# Patient Record
Sex: Female | Born: 1955 | Race: White | Hispanic: No | Marital: Married | Smoking: Never smoker
Health system: Southern US, Community
[De-identification: ages and names within clinical notes are randomized; demographics above are authoritative.]

## PROBLEM LIST (undated history)

## (undated) DIAGNOSIS — D638 Anemia in other chronic diseases classified elsewhere: Secondary | ICD-10-CM

## (undated) DIAGNOSIS — F419 Anxiety disorder, unspecified: Secondary | ICD-10-CM

## (undated) DIAGNOSIS — M1990 Unspecified osteoarthritis, unspecified site: Secondary | ICD-10-CM

## (undated) DIAGNOSIS — S83241A Other tear of medial meniscus, current injury, right knee, initial encounter: Secondary | ICD-10-CM

## (undated) DIAGNOSIS — K8020 Calculus of gallbladder without cholecystitis without obstruction: Secondary | ICD-10-CM

## (undated) DIAGNOSIS — M11262 Other chondrocalcinosis, left knee: Secondary | ICD-10-CM

## (undated) DIAGNOSIS — R002 Palpitations: Secondary | ICD-10-CM

## (undated) DIAGNOSIS — M359 Systemic involvement of connective tissue, unspecified: Secondary | ICD-10-CM

## (undated) DIAGNOSIS — K219 Gastro-esophageal reflux disease without esophagitis: Secondary | ICD-10-CM

## (undated) DIAGNOSIS — K760 Fatty (change of) liver, not elsewhere classified: Secondary | ICD-10-CM

## (undated) DIAGNOSIS — M059 Rheumatoid arthritis with rheumatoid factor, unspecified: Secondary | ICD-10-CM

## (undated) DIAGNOSIS — E785 Hyperlipidemia, unspecified: Secondary | ICD-10-CM

## (undated) DIAGNOSIS — N904 Leukoplakia of vulva: Secondary | ICD-10-CM

## (undated) DIAGNOSIS — F329 Major depressive disorder, single episode, unspecified: Secondary | ICD-10-CM

## (undated) DIAGNOSIS — G5600 Carpal tunnel syndrome, unspecified upper limb: Secondary | ICD-10-CM

## (undated) DIAGNOSIS — Z8639 Personal history of other endocrine, nutritional and metabolic disease: Secondary | ICD-10-CM

## (undated) DIAGNOSIS — I10 Essential (primary) hypertension: Secondary | ICD-10-CM

## (undated) DIAGNOSIS — M159 Polyosteoarthritis, unspecified: Secondary | ICD-10-CM

## (undated) DIAGNOSIS — L409 Psoriasis, unspecified: Secondary | ICD-10-CM

## (undated) DIAGNOSIS — J302 Other seasonal allergic rhinitis: Secondary | ICD-10-CM

## (undated) DIAGNOSIS — Z8719 Personal history of other diseases of the digestive system: Secondary | ICD-10-CM

## (undated) DIAGNOSIS — R079 Chest pain, unspecified: Secondary | ICD-10-CM

## (undated) DIAGNOSIS — F32A Depression, unspecified: Secondary | ICD-10-CM

## (undated) DIAGNOSIS — N3010 Interstitial cystitis (chronic) without hematuria: Secondary | ICD-10-CM

## (undated) DIAGNOSIS — M810 Age-related osteoporosis without current pathological fracture: Secondary | ICD-10-CM

## (undated) HISTORY — DX: Psoriasis, unspecified: L40.9

## (undated) HISTORY — DX: Anxiety disorder, unspecified: F41.9

## (undated) HISTORY — DX: Calculus of gallbladder without cholecystitis without obstruction: K80.20

## (undated) HISTORY — DX: Palpitations: R00.2

## (undated) HISTORY — DX: Anemia in other chronic diseases classified elsewhere: D63.8

## (undated) HISTORY — DX: Essential (primary) hypertension: I10

## (undated) HISTORY — DX: Fatty (change of) liver, not elsewhere classified: K76.0

## (undated) HISTORY — DX: Polyosteoarthritis, unspecified: M15.9

## (undated) HISTORY — DX: Personal history of other endocrine, nutritional and metabolic disease: Z86.39

## (undated) HISTORY — DX: Rheumatoid arthritis with rheumatoid factor, unspecified: M05.9

## (undated) HISTORY — DX: Gastro-esophageal reflux disease without esophagitis: K21.9

## (undated) HISTORY — DX: Depression, unspecified: F32.A

## (undated) HISTORY — DX: Hyperlipidemia, unspecified: E78.5

## (undated) HISTORY — DX: Age-related osteoporosis without current pathological fracture: M81.0

## (undated) HISTORY — DX: Unspecified osteoarthritis, unspecified site: M19.90

## (undated) HISTORY — PX: COLONOSCOPY: SHX174

## (undated) HISTORY — DX: Chest pain, unspecified: R07.9

## (undated) HISTORY — DX: Carpal tunnel syndrome, unspecified upper limb: G56.00

## (undated) HISTORY — DX: Other chondrocalcinosis, left knee: M11.262

## (undated) HISTORY — PX: BREAST BIOPSY: SHX20

## (undated) HISTORY — DX: Major depressive disorder, single episode, unspecified: F32.9

---

## 1979-07-13 HISTORY — PX: TUBAL LIGATION: SHX77

## 1989-07-12 HISTORY — PX: ABDOMINAL HYSTERECTOMY: SHX81

## 1997-12-23 ENCOUNTER — Encounter: Admission: RE | Admit: 1997-12-23 | Discharge: 1998-03-23 | Payer: Self-pay | Admitting: Internal Medicine

## 1998-07-29 ENCOUNTER — Emergency Department (HOSPITAL_COMMUNITY): Admission: EM | Admit: 1998-07-29 | Discharge: 1998-07-29 | Payer: Self-pay | Admitting: Emergency Medicine

## 1998-08-29 ENCOUNTER — Emergency Department (HOSPITAL_COMMUNITY): Admission: EM | Admit: 1998-08-29 | Discharge: 1998-08-29 | Payer: Self-pay | Admitting: Emergency Medicine

## 1999-03-23 ENCOUNTER — Other Ambulatory Visit: Admission: RE | Admit: 1999-03-23 | Discharge: 1999-03-23 | Payer: Self-pay | Admitting: Obstetrics and Gynecology

## 1999-04-23 ENCOUNTER — Encounter (INDEPENDENT_AMBULATORY_CARE_PROVIDER_SITE_OTHER): Payer: Self-pay | Admitting: Specialist

## 1999-04-23 ENCOUNTER — Other Ambulatory Visit: Admission: RE | Admit: 1999-04-23 | Discharge: 1999-04-23 | Payer: Self-pay | Admitting: Obstetrics and Gynecology

## 1999-05-21 ENCOUNTER — Other Ambulatory Visit: Admission: RE | Admit: 1999-05-21 | Discharge: 1999-05-21 | Payer: Self-pay | Admitting: Surgery

## 2000-03-17 ENCOUNTER — Emergency Department (HOSPITAL_COMMUNITY): Admission: EM | Admit: 2000-03-17 | Discharge: 2000-03-17 | Payer: Self-pay | Admitting: Emergency Medicine

## 2000-12-19 ENCOUNTER — Encounter: Admission: RE | Admit: 2000-12-19 | Discharge: 2001-02-08 | Payer: Self-pay | Admitting: Pulmonary Disease

## 2001-01-05 ENCOUNTER — Other Ambulatory Visit: Admission: RE | Admit: 2001-01-05 | Discharge: 2001-01-05 | Payer: Self-pay | Admitting: Obstetrics and Gynecology

## 2001-11-21 ENCOUNTER — Other Ambulatory Visit: Admission: RE | Admit: 2001-11-21 | Discharge: 2001-11-21 | Payer: Self-pay | Admitting: Obstetrics and Gynecology

## 2002-07-12 HISTORY — PX: CYSTOSCOPY: SUR368

## 2004-10-12 ENCOUNTER — Ambulatory Visit: Payer: Self-pay | Admitting: Family Medicine

## 2004-10-20 ENCOUNTER — Ambulatory Visit: Payer: Self-pay | Admitting: Internal Medicine

## 2004-11-23 ENCOUNTER — Ambulatory Visit: Payer: Self-pay | Admitting: Internal Medicine

## 2004-12-01 ENCOUNTER — Ambulatory Visit: Payer: Self-pay | Admitting: Internal Medicine

## 2005-02-08 ENCOUNTER — Ambulatory Visit: Payer: Self-pay | Admitting: Internal Medicine

## 2005-04-05 ENCOUNTER — Ambulatory Visit: Payer: Self-pay | Admitting: Internal Medicine

## 2005-04-07 ENCOUNTER — Ambulatory Visit: Payer: Self-pay | Admitting: Internal Medicine

## 2005-04-09 ENCOUNTER — Ambulatory Visit: Payer: Self-pay | Admitting: Internal Medicine

## 2005-04-13 ENCOUNTER — Ambulatory Visit: Payer: Self-pay | Admitting: Family Medicine

## 2005-08-24 ENCOUNTER — Ambulatory Visit: Payer: Self-pay | Admitting: Internal Medicine

## 2005-08-27 ENCOUNTER — Encounter: Admission: RE | Admit: 2005-08-27 | Discharge: 2005-08-27 | Payer: Self-pay | Admitting: Internal Medicine

## 2005-08-31 ENCOUNTER — Ambulatory Visit: Payer: Self-pay | Admitting: Internal Medicine

## 2006-07-25 ENCOUNTER — Ambulatory Visit: Payer: Self-pay | Admitting: Internal Medicine

## 2006-10-12 ENCOUNTER — Ambulatory Visit: Payer: Self-pay | Admitting: Internal Medicine

## 2006-10-12 ENCOUNTER — Encounter: Payer: Self-pay | Admitting: Family Medicine

## 2006-10-12 LAB — CONVERTED CEMR LAB
ALT: 40 units/L (ref 0–40)
AST: 33 units/L (ref 0–37)
Creatinine, Ser: 0.6 mg/dL (ref 0.4–1.2)
Creatinine,U: 59.5 mg/dL
Hgb A1c MFr Bld: 6.3 % — ABNORMAL HIGH (ref 4.6–6.0)

## 2006-10-14 ENCOUNTER — Encounter: Payer: Self-pay | Admitting: Internal Medicine

## 2006-10-26 ENCOUNTER — Ambulatory Visit: Payer: Self-pay | Admitting: Internal Medicine

## 2007-11-27 ENCOUNTER — Ambulatory Visit: Payer: Self-pay | Admitting: Internal Medicine

## 2007-11-27 LAB — CONVERTED CEMR LAB
ALT: 50 units/L — ABNORMAL HIGH (ref 0–35)
Creatinine, Ser: 0.6 mg/dL (ref 0.4–1.2)
Direct LDL: 151.3 mg/dL
Potassium: 4.2 meq/L (ref 3.5–5.1)
Triglycerides: 90 mg/dL (ref 0–149)

## 2007-12-01 DIAGNOSIS — Z9189 Other specified personal risk factors, not elsewhere classified: Secondary | ICD-10-CM | POA: Insufficient documentation

## 2007-12-01 DIAGNOSIS — G43909 Migraine, unspecified, not intractable, without status migrainosus: Secondary | ICD-10-CM | POA: Insufficient documentation

## 2007-12-01 DIAGNOSIS — E785 Hyperlipidemia, unspecified: Secondary | ICD-10-CM | POA: Insufficient documentation

## 2007-12-05 ENCOUNTER — Ambulatory Visit: Payer: Self-pay | Admitting: Internal Medicine

## 2007-12-05 DIAGNOSIS — M2550 Pain in unspecified joint: Secondary | ICD-10-CM | POA: Insufficient documentation

## 2007-12-05 LAB — CONVERTED CEMR LAB
Cholesterol, target level: 200 mg/dL
Creatinine,U: 145.5 mg/dL
HDL goal, serum: 40 mg/dL
LDL Goal: 100 mg/dL
Microalb Creat Ratio: 7.6 mg/g (ref 0.0–30.0)
Sed Rate: 27 mm/hr — ABNORMAL HIGH (ref 0–22)

## 2007-12-06 ENCOUNTER — Encounter (INDEPENDENT_AMBULATORY_CARE_PROVIDER_SITE_OTHER): Payer: Self-pay | Admitting: *Deleted

## 2008-03-01 ENCOUNTER — Ambulatory Visit: Payer: Self-pay | Admitting: Internal Medicine

## 2008-03-01 LAB — CONVERTED CEMR LAB
Creatinine, Ser: 0.6 mg/dL (ref 0.4–1.2)
Microalb, Ur: 0.5 mg/dL (ref 0.0–1.9)
Potassium: 4.2 meq/L (ref 3.5–5.1)
Total Bilirubin: 0.6 mg/dL (ref 0.3–1.2)

## 2008-03-06 ENCOUNTER — Ambulatory Visit: Payer: Self-pay | Admitting: Internal Medicine

## 2008-03-06 DIAGNOSIS — I10 Essential (primary) hypertension: Secondary | ICD-10-CM | POA: Insufficient documentation

## 2008-03-06 DIAGNOSIS — G5600 Carpal tunnel syndrome, unspecified upper limb: Secondary | ICD-10-CM | POA: Insufficient documentation

## 2008-03-06 DIAGNOSIS — K219 Gastro-esophageal reflux disease without esophagitis: Secondary | ICD-10-CM | POA: Insufficient documentation

## 2008-03-06 LAB — CONVERTED CEMR LAB
Cholesterol, target level: 200 mg/dL
LDL Goal: 110 mg/dL

## 2008-03-09 LAB — CONVERTED CEMR LAB
Direct LDL: 142.3 mg/dL
Free T4: 0.8 ng/dL (ref 0.6–1.6)
HDL: 50.3 mg/dL (ref >39.0)
VLDL: 23 mg/dL (ref 0–40)

## 2008-03-12 ENCOUNTER — Telehealth (INDEPENDENT_AMBULATORY_CARE_PROVIDER_SITE_OTHER): Payer: Self-pay | Admitting: *Deleted

## 2008-05-07 ENCOUNTER — Encounter: Payer: Self-pay | Admitting: Internal Medicine

## 2008-05-08 ENCOUNTER — Ambulatory Visit: Payer: Self-pay | Admitting: Internal Medicine

## 2008-05-10 ENCOUNTER — Encounter: Payer: Self-pay | Admitting: Internal Medicine

## 2008-06-03 ENCOUNTER — Encounter: Payer: Self-pay | Admitting: Internal Medicine

## 2008-06-27 ENCOUNTER — Encounter: Payer: Self-pay | Admitting: Internal Medicine

## 2008-07-19 ENCOUNTER — Encounter: Payer: Self-pay | Admitting: Internal Medicine

## 2008-08-01 ENCOUNTER — Ambulatory Visit: Payer: Self-pay | Admitting: Internal Medicine

## 2008-08-13 ENCOUNTER — Encounter (INDEPENDENT_AMBULATORY_CARE_PROVIDER_SITE_OTHER): Payer: Self-pay | Admitting: *Deleted

## 2008-08-13 LAB — CONVERTED CEMR LAB
AST: 32 units/L (ref 0–37)
Albumin: 3.4 g/dL — ABNORMAL LOW (ref 3.5–5.2)
Alkaline Phosphatase: 54 units/L (ref 39–117)
BUN: 13 mg/dL (ref 6–23)
Bilirubin, Direct: 0.1 mg/dL (ref 0.0–0.3)
HDL: 45.1 mg/dL (ref >39.0)
Microalb, Ur: 0.8 mg/dL (ref 0.0–1.9)
Total Bilirubin: 0.6 mg/dL (ref 0.3–1.2)

## 2008-09-05 ENCOUNTER — Ambulatory Visit: Payer: Self-pay | Admitting: Internal Medicine

## 2008-09-05 DIAGNOSIS — M25569 Pain in unspecified knee: Secondary | ICD-10-CM | POA: Insufficient documentation

## 2008-11-01 ENCOUNTER — Encounter (INDEPENDENT_AMBULATORY_CARE_PROVIDER_SITE_OTHER): Payer: Self-pay | Admitting: *Deleted

## 2008-11-13 ENCOUNTER — Ambulatory Visit: Payer: Self-pay | Admitting: Internal Medicine

## 2008-11-15 ENCOUNTER — Encounter: Payer: Self-pay | Admitting: Internal Medicine

## 2008-12-03 ENCOUNTER — Ambulatory Visit: Payer: Self-pay | Admitting: Internal Medicine

## 2008-12-03 DIAGNOSIS — Z794 Long term (current) use of insulin: Secondary | ICD-10-CM

## 2008-12-03 DIAGNOSIS — E1165 Type 2 diabetes mellitus with hyperglycemia: Secondary | ICD-10-CM | POA: Insufficient documentation

## 2008-12-03 DIAGNOSIS — E119 Type 2 diabetes mellitus without complications: Secondary | ICD-10-CM | POA: Insufficient documentation

## 2009-01-01 ENCOUNTER — Ambulatory Visit: Payer: Self-pay | Admitting: Internal Medicine

## 2009-01-10 ENCOUNTER — Telehealth (INDEPENDENT_AMBULATORY_CARE_PROVIDER_SITE_OTHER): Payer: Self-pay | Admitting: *Deleted

## 2009-04-08 ENCOUNTER — Ambulatory Visit: Payer: Self-pay | Admitting: Internal Medicine

## 2009-04-13 LAB — CONVERTED CEMR LAB
Cholesterol: 161 mg/dL (ref 0–200)
HDL: 32.7 mg/dL — ABNORMAL LOW (ref >39.00)
Hgb A1c MFr Bld: 6.5 % (ref 4.6–6.5)
LDL Cholesterol: 103 mg/dL — ABNORMAL HIGH (ref 0–99)
Total CHOL/HDL Ratio: 5
Triglycerides: 126 mg/dL (ref 0.0–149.0)

## 2009-04-14 ENCOUNTER — Ambulatory Visit: Payer: Self-pay | Admitting: Internal Medicine

## 2009-04-14 DIAGNOSIS — F411 Generalized anxiety disorder: Secondary | ICD-10-CM | POA: Insufficient documentation

## 2009-04-14 LAB — CONVERTED CEMR LAB
Specific Gravity, Urine: <1.005
Urobilinogen, UA: NEGATIVE

## 2009-04-18 ENCOUNTER — Telehealth (INDEPENDENT_AMBULATORY_CARE_PROVIDER_SITE_OTHER): Payer: Self-pay | Admitting: *Deleted

## 2009-05-14 ENCOUNTER — Ambulatory Visit: Payer: Self-pay | Admitting: Family

## 2009-05-14 LAB — CONVERTED CEMR LAB
Bilirubin Urine: NEGATIVE
Glucose, Urine, Semiquant: NEGATIVE
Protein, U semiquant: NEGATIVE
Specific Gravity, Urine: 1.015
Urobilinogen, UA: 0.2
pH: 6

## 2009-05-15 ENCOUNTER — Encounter: Payer: Self-pay | Admitting: Family Medicine

## 2009-05-20 ENCOUNTER — Telehealth (INDEPENDENT_AMBULATORY_CARE_PROVIDER_SITE_OTHER): Payer: Self-pay | Admitting: *Deleted

## 2009-06-12 ENCOUNTER — Ambulatory Visit: Payer: Self-pay | Admitting: Internal Medicine

## 2009-06-12 ENCOUNTER — Encounter: Payer: Self-pay | Admitting: Family Medicine

## 2009-06-12 LAB — CONVERTED CEMR LAB
Bilirubin Urine: NEGATIVE
Ketones, urine, test strip: NEGATIVE
Specific Gravity, Urine: 1.015
pH: 5

## 2009-06-16 ENCOUNTER — Encounter (INDEPENDENT_AMBULATORY_CARE_PROVIDER_SITE_OTHER): Payer: Self-pay | Admitting: *Deleted

## 2009-07-29 ENCOUNTER — Telehealth (INDEPENDENT_AMBULATORY_CARE_PROVIDER_SITE_OTHER): Payer: Self-pay | Admitting: *Deleted

## 2009-08-04 ENCOUNTER — Ambulatory Visit: Payer: Self-pay | Admitting: Internal Medicine

## 2009-08-11 LAB — CONVERTED CEMR LAB
Microalb Creat Ratio: 11.2 mg/g (ref 0.0–30.0)
Microalb, Ur: 0.6 mg/dL (ref 0.0–1.9)

## 2009-08-15 ENCOUNTER — Ambulatory Visit: Payer: Self-pay | Admitting: Internal Medicine

## 2009-08-15 ENCOUNTER — Encounter (INDEPENDENT_AMBULATORY_CARE_PROVIDER_SITE_OTHER): Payer: Self-pay | Admitting: *Deleted

## 2009-08-15 DIAGNOSIS — M545 Low back pain, unspecified: Secondary | ICD-10-CM | POA: Insufficient documentation

## 2009-08-15 LAB — CONVERTED CEMR LAB
Glucose, Urine, Semiquant: NEGATIVE
Nitrite: NEGATIVE
Protein, U semiquant: NEGATIVE
Specific Gravity, Urine: 1.02
WBC Urine, dipstick: NEGATIVE
pH: 5

## 2009-08-16 ENCOUNTER — Encounter: Payer: Self-pay | Admitting: Internal Medicine

## 2009-11-11 ENCOUNTER — Telehealth (INDEPENDENT_AMBULATORY_CARE_PROVIDER_SITE_OTHER): Payer: Self-pay | Admitting: *Deleted

## 2009-12-05 ENCOUNTER — Ambulatory Visit: Payer: Self-pay | Admitting: Internal Medicine

## 2009-12-09 LAB — CONVERTED CEMR LAB
ALT: 20 units/L (ref 0–35)
AST: 17 units/L (ref 0–37)
Creatinine, Ser: 0.6 mg/dL (ref 0.4–1.2)
Creatinine,U: 95 mg/dL
Hgb A1c MFr Bld: 6.2 % (ref 4.6–6.5)
Potassium: 4.4 meq/L (ref 3.5–5.1)
Total CHOL/HDL Ratio: 5

## 2009-12-12 ENCOUNTER — Ambulatory Visit: Payer: Self-pay | Admitting: Internal Medicine

## 2010-01-05 ENCOUNTER — Telehealth (INDEPENDENT_AMBULATORY_CARE_PROVIDER_SITE_OTHER): Payer: Self-pay | Admitting: *Deleted

## 2010-02-23 ENCOUNTER — Telehealth (INDEPENDENT_AMBULATORY_CARE_PROVIDER_SITE_OTHER): Payer: Self-pay | Admitting: *Deleted

## 2010-03-17 ENCOUNTER — Ambulatory Visit: Payer: Self-pay | Admitting: Internal Medicine

## 2010-03-19 LAB — CONVERTED CEMR LAB
ALT: 17 units/L (ref 0–35)
AST: 18 units/L (ref 0–37)
Alkaline Phosphatase: 41 units/L (ref 39–117)
Cholesterol: 169 mg/dL (ref 0–200)
HDL: 34.6 mg/dL — ABNORMAL LOW (ref >39.00)
Hgb A1c MFr Bld: 6.1 % (ref 4.6–6.5)
Total CHOL/HDL Ratio: 5
Triglycerides: 99 mg/dL (ref 0.0–149.0)
VLDL: 19.8 mg/dL (ref 0.0–40.0)

## 2010-03-23 ENCOUNTER — Ambulatory Visit: Payer: Self-pay | Admitting: Internal Medicine

## 2010-05-07 ENCOUNTER — Telehealth (INDEPENDENT_AMBULATORY_CARE_PROVIDER_SITE_OTHER): Payer: Self-pay | Admitting: *Deleted

## 2010-08-13 NOTE — Assessment & Plan Note (Signed)
Summary: 4 month roa//lch   Vital Signs:  Patient profile:   55 year old female Weight:      201.8 pounds Pulse rate:   60 / minute Resp:     15 per minute BP sitting:   110 / 76  (left arm) Cuff size:   large  Vitals Entered By: Shonna Chock (December 12, 2009 9:07 AM) CC: 4 month: Discuss labs, Discuss Glucophage (patient changed on her own to 1/2 of 1000mg  tab bid) Comments REVIEWED MED LIST, PATIENT AGREED DOSE AND INSTRUCTION CORRECT    CC:  4 month: Discuss labs and Discuss Glucophage (patient changed on her own to 1/2 of 1000mg  tab bid).  History of Present Illness: Labs reviewed & riskd discussed. A1c is  essentially stable.LDL up from 103 to 129 with change from Actos to Januvia.She decreased  Glucophage from 1000 ng two times a day to 500 mg two times a day because of hypoglycemia into 70s mid afternoon. FBS average 110; 2 hrs post meal < 135. CVE as walking 40 min/ day; small portions & "DEC" diet.  Allergies: 1)  ! Pcn 2)  ! Vicodin  Review of Systems General:  Denies fatigue. Eyes:  Denies blurring, double vision, and vision loss-both eyes; Ophth exam due. CV:  Denies chest pain or discomfort, leg cramps with exertion, lightheadness, and near fainting. Derm:  Denies poor wound healing. Neuro:  Denies numbness and tingling. Endo:  Denies excessive hunger, excessive thirst, and excessive urination.  Physical Exam  General:  well-nourished; alert,appropriate and cooperative throughout examination Heart:  regular rhythm, no murmur, no gallop, no rub, no JVD, and bradycardia.   Pulses:  R and L carotid,radial,dorsalis pedis and posterior tibial pulses are full and equal bilaterally Extremities:  No clubbing, cyanosis, edema. Neurologic:  alert & oriented X3 and sensation intact to light touch over feet.   Skin:  Intact without suspicious lesions or rashes Psych:  Focused & intelligent   Impression & Recommendations:  Problem # 1:  DIABETES MELLITUS, CONTROLLED  (ICD-250.00) some hypoglycemia Her updated medication list for this problem includes:    Glucophage 1000 Mg Tabs (Metformin hcl) .Marland Kitchen... 1 with largest meal; 1/2 with 2nd largest meal    Lisinopril 20 Mg Tabs (Lisinopril) .Marland Kitchen... 1 qd    Januvia 100 Mg Tabs (Sitagliptin phosphate) .Marland Kitchen... 1 once daily    Aspirin 81 Mg Tabs (Aspirin) .Marland KitchenMarland KitchenMarland KitchenMarland Kitchen 3 by mouth in the am  Problem # 2:  HYPERLIPIDEMIA (ICD-272.4)  Complete Medication List: 1)  Glucophage 1000 Mg Tabs (Metformin hcl) .Marland KitchenMarland Kitchen. 1 with largest meal; 1/2 with 2nd largest meal 2)  Astepro 137 Mcg/spray Soln (Azelastine hcl) .... Use 2 sprays in each nostril twice daily as needed 3)  Accu-chek Aviva Strp (Glucose blood) .... Use as directed once daily. 4)  Lisinopril 20 Mg Tabs (Lisinopril) .Marland Kitchen.. 1 qd 5)  Pravastatin Sodium 40 Mg Tabs (pravastatin Sodium)  .Marland Kitchen.. 1 at bedtime 6)  Diazepam 5 Mg Tabs (Diazepam) .Marland Kitchen.. 1 once daily as needed 7)  Joint Juice (glucosamine)  .... Daily 8)  Flax Seed Oil 1000 Mg Caps (Flaxseed (linseed)) .Marland Kitchen.. 1 by mouth two times a day 9)  Sertraline Hcl 50 Mg Tabs (Sertraline hcl) .... 1/2 once daily x 1 week then 1 once daily 10)  Biotin 1000 Mcg Tabs (Biotin) .Marland Kitchen.. 1 by mouth once daily 11)  Nitrofurantoin Monohyd Macro 100 Mg Caps (Nitrofurantoin monohyd macro) .Marland Kitchen.. 1 two times a day 12)  Januvia 100 Mg Tabs (Sitagliptin phosphate) .Marland KitchenMarland KitchenMarland Kitchen  1 once daily 13)  Cyclobenzaprine Hcl 5 Mg Tabs (Cyclobenzaprine hcl) .Marland Kitchen.. 1-2 at bedtime as needed back pain 14)  Cetirizine Hcl 10 Mg Tabs (Cetirizine hcl) .Marland Kitchen.. 1 by mouth once daily 15)  Ranitidine Hcl 150 Mg Tabs (Ranitidine hcl) .Marland Kitchen.. 1 by mouth once daily 16)  Aspirin 81 Mg Tabs (Aspirin) .... 3 by mouth in the am  Patient Instructions: 1)  Check your blood sugars regularly. If your readings are usually above :150 or below 90 you should contact our office. 2)  See your eye doctor yearly to check for diabetic eye damage. 3)  Check your feet each night for sore areas, calluses or signs of  infection. Greek yogurt 4 oz  after  lunch to prevent hypoglycemia. 4)  Please schedule a follow-up appointment in 3 months. 5)  Hepatic Panel prior to visit, ICD-9:995.20 6)  Lipid Panel prior to visit, ICD-9:272.4 7)  HbgA1C prior to visit, ICD-9:250.00 Prescriptions: JANUVIA 100 MG TABS (SITAGLIPTIN PHOSPHATE) 1 once daily  #30 x 5   Entered and Authorized by:   Marga Melnick MD   Signed by:   Marga Melnick MD on 12/12/2009   Method used:   Print then Give to Patient   RxID:   [id number] SERTRALINE HCL 50 MG TABS (SERTRALINE HCL) 1/2 once daily X 1 week then 1 once daily  #90 x 1   Entered and Authorized by:   Marga Melnick MD   Signed by:   Marga Melnick MD on 12/12/2009   Method used:   Print then Give to Patient   RxID:   [id number] ACCU-CHEK AVIVA   STRP (GLUCOSE BLOOD) use as directed once daily.  #100 x 3   Entered and Authorized by:   Marga Melnick MD   Signed by:   Marga Melnick MD on 12/12/2009   Method used:   Print then Give to Patient   RxID:   [id number] ASTEPRO 137 MCG/SPRAY  SOLN (AZELASTINE HCL) use 2 sprays in each nostril twice daily as needed  #1 x 11   Entered and Authorized by:   Marga Melnick MD   Signed by:   Marga Melnick MD on 12/12/2009   Method used:   Print then Give to Patient   RxID:   [id number] PRAVASTATIN SODIUM 40 MG TABS (PRAVASTATIN SODIUM) 1 at bedtime  #90 x 1   Entered and Authorized by:   Marga Melnick MD   Signed by:   Marga Melnick MD on 12/12/2009   Method used:   Print then Give to Patient   RxID:   [id number] GLUCOPHAGE 1000 MG  TABS (METFORMIN HCL) 1 with largest meal; 1/2 with 2nd largest meal  #90 x 1   Entered and Authorized by:   Marga Melnick MD   Signed by:   Marga Melnick MD on 12/12/2009   Method used:   Print then Give to Patient   RxID:   [id number]

## 2010-08-13 NOTE — Progress Notes (Signed)
Summary: refill  Phone Note Refill Request Message from:  Fax from Pharmacy on January 05, 2010 10:28 AM  Refills Requested: Medication #1:  JANUVIA 100 MG TABS 1 once daily [facility] - fax [phone] - phone [phone]  Initial call taken by: Okey Regal [place],  January 05, 2010 10:29 AM    Prescriptions: JANUVIA 100 MG TABS (SITAGLIPTIN PHOSPHATE) 1 once daily  #30 x 5   Entered by:   Shonna Chock   Authorized by:   Marga Melnick MD   Signed by:   Shonna Chock on 01/05/2010   Method used:   Faxed to ...       [facility] (retail)       [address]       [place], [place]  [address]       Ph: [phone]       Fax: [phone]   RxID:   [id number]

## 2010-08-13 NOTE — Assessment & Plan Note (Signed)
Summary: RTO 3 MONTHS/DISCUSS LAB/CBS   Vital Signs:  Patient profile:   55 year old female Weight:      193.2 pounds Pulse rate:   60 / minute Resp:     15 per minute BP sitting:   118 / 70  (left arm) Cuff size:   large  Vitals Entered By: Shonna Chock CMA (March 23, 2010 9:10 AM) CC: 3 Month follow-up, discuss labs (copy given), Type 2 diabetes mellitus follow-up   CC:  3 Month follow-up, discuss labs (copy given), and Type 2 diabetes mellitus follow-up.  History of Present Illness: Type 2 Diabetes Mellitus Follow-Up      The patient reports weight loss of 44 # total, but denies polyuria, polydipsia, blurred vision, self managed hypoglycemia, and numbness of extremities.  The patient denies the following symptoms: neuropathic pain, chest pain, vomiting, orthostatic symptoms, poor wound healing, intermittent claudication, vision loss, and foot ulcer.  Since the last visit the patient reports good dietary compliance  but  not exercising regularly.  The p ( < 120).  Since the last visit, the patient reports having had eye care by an ophthalmologist but  no foot care.   A1c was 7.6% in 01 /2010 ( 172 average sugar; risk 52% ); A1c now 6.1% ( 129/; 22% risk). LDL 115.  Allergies: 1)  ! Pcn 2)  ! Vicodin  Physical Exam  General:  well-nourished; alert,appropriate and cooperative throughout examination Lungs:  Normal respiratory effort, chest expands symmetrically. Lungs are clear to auscultation, no crackles or wheezes. Heart:  Normal rate and regular rhythm. S1 and S2 normal without gallop, murmur, click, rub .S4 with slurring Pulses:  R and L carotid,radial,dorsalis pedis and posterior tibial pulses are full and equal bilaterally Extremities:  No clubbing, cyanosis, edema.Good nail health Neurologic:  alert & oriented X3 and sensation intact to light touch over feet.   Skin:  Intact without suspicious lesions or rashes   Impression & Recommendations:  Problem # 1:  DIABETES  MELLITUS, CONTROLLED (ICD-250.00)  Her updated medication list for this problem includes:    Glucophage 1000 Mg Tabs (Metformin hcl) .Marland Kitchen... 1/2  two times a day with 2 largest meals    Lisinopril 20 Mg Tabs (Lisinopril) .Marland Kitchen... 1 qd    Januvia 100 Mg Tabs (Sitagliptin phosphate) .Marland Kitchen... 1 once daily    Aspirin 81 Mg Tabs (Aspirin) .Marland KitchenMarland KitchenMarland KitchenMarland Kitchen 3 by mouth in the am  Problem # 2:  HYPERLIPIDEMIA (ICD-272.4)  Complete Medication List: 1)  Glucophage 1000 Mg Tabs (Metformin hcl) .... 1/2  two times a day with 2 largest meals 2)  Astepro 137 Mcg/spray Soln (Azelastine hcl) .... Use 2 sprays in each nostril twice daily as needed 3)  Accu-chek Aviva Strp (Glucose blood) .... Use as directed once daily. 4)  Lisinopril 20 Mg Tabs (Lisinopril) .Marland Kitchen.. 1 qd 5)  Pravastatin Sodium 40 Mg Tabs (pravastatin Sodium)  .Marland Kitchen.. 1 at bedtime 6)  Diazepam 5 Mg Tabs (Diazepam) .Marland Kitchen.. 1 once daily as needed 7)  Joint Juice (glucosamine)  .... Daily 8)  Flax Seed Oil 1000 Mg Caps (Flaxseed (linseed)) .Marland Kitchen.. 1 by mouth two times a day 9)  Sertraline Hcl 50 Mg Tabs (Sertraline hcl) .Marland Kitchen.. 1 once daily 10)  Biotin 1000 Mcg Tabs (Biotin) .Marland Kitchen.. 1 by mouth once daily 11)  Nitrofurantoin Monohyd Macro 100 Mg Caps (Nitrofurantoin monohyd macro) .Marland Kitchen.. 1 two times a day 12)  Januvia 100 Mg Tabs (Sitagliptin phosphate) .Marland Kitchen.. 1 once daily 13)  Cyclobenzaprine Hcl 5  Mg Tabs (Cyclobenzaprine hcl) .Marland Kitchen.. 1-2 at bedtime as needed back pain 14)  Cetirizine Hcl 10 Mg Tabs (Cetirizine hcl) .Marland Kitchen.. 1 by mouth once daily 15)  Ranitidine Hcl 150 Mg Tabs (Ranitidine hcl) .Marland Kitchen.. 1 by mouth once daily 16)  Aspirin 81 Mg Tabs (Aspirin) .... 3 by mouth in the am  Other Orders: Admin 1st Vaccine (14782) Flu Vaccine 26yrs + (95621) Flu Vaccine Consent Questions     Do you have a history of severe allergic reactions to this vaccine? no    Any prior history of allergic reactions to egg and/or gelatin? no    Do you have a sensitivity to the preservative Thimersol? no     Do you have a past history of Guillan-Barre Syndrome? no    Do you currently have an acute febrile illness? no    Have you ever had a severe reaction to latex? no    Vaccine information given and explained to patient? yes    Are you currently pregnant? no    Lot Number:[id number]   Exp Date:01/09/2011   Site Given  Right Deltoid IMspirin 81 Mg Tabs (Aspirin) .... 3 by mouth in the am  Other Orders: Admin 1st Vaccine (30865) Flu Vaccine 5yrs + (78469)  Patient Instructions: 1)  Please schedule a follow-up appointment in 6 months. 2)  It is important that you exercise regularly at least 20 minutes 5 times a week. If you develop chest pain, have severe difficulty breathing, or feel very tired , stop exercising immediately and seek medical attention. 3)  Take an  81 mg coazted Aspirin every day. 4)  BUN,creat , K+ prior to visit, ICD-9:401.9 5)  Hepatic Panel prior to visit, ICD-9:995.20 6)  Lipid Panel prior to visit, ICD-9:272.4 7)  HbgA1C prior to visit, ICD-9:250.00 8)  Urine Microalbumin prior to visit, ICD-9:250.00 9)  Check your blood sugars regularly. If your readings are usually above : 150 or below 90 you should contact our office. Prescriptions: SERTRALINE HCL 50 MG TABS (SERTRALINE HCL) 1 once daily  #90 x 1   Entered and Authorized by:   Marga Melnick MD   Signed by:   Marga Melnick MD on 03/23/2010   Method used:   Print then Give to Patient   RxID:   [id number] LISINOPRIL 20 MG  TABS (LISINOPRIL) 1 qd  #90 Each x 3   Entered and Authorized by:   Marga Melnick MD   Signed by:   Marga Melnick MD on 03/23/2010   Method used:   Print then Give to Patient   RxID:   [id number] GLUCOPHAGE 1000 MG  TABS (METFORMIN HCL) 1/2  two times a day with 2 largest meals  #90 x 3   Entered and Authorized by:   Marga Melnick MD   Signed by:   Marga Melnick MD on 03/23/2010   Method used:   Print then Give to Patient   RxID:   [id number] JANUVIA 100 MG TABS  (SITAGLIPTIN PHOSPHATE) 1 once daily  #90 x 1   Entered and Authorized by:   Marga Melnick MD   Signed by:   Marga Melnick MD on 03/23/2010   Method used:   Print then Give to Patient   RxID:   [id number] GLUCOPHAGE 1000 MG  TABS (METFORMIN HCL) 1 with largest meal; 1/2 with 2nd largest meal  #90 x 3   Entered and Authorized by:   Marga Melnick MD   Signed by:   Marga Melnick MD on 03/23/2010  Method used:   Print then Give to Patient   RxID:   [id number] PRAVASTATIN SODIUM 40 MG TABS (PRAVASTATIN SODIUM) 1 at bedtime  #90 x 3   Entered and Authorized by:   Marga Melnick MD   Signed by:   Marga Melnick MD on 03/23/2010   Method used:   Print then Give to Patient   RxID:   [id number]  .lbflu

## 2010-08-13 NOTE — Progress Notes (Signed)
Summary: pravastatin refill  Phone Note Refill Request Message from:  Fax from Pharmacy on May 07, 2010 11:23 AM  Refills Requested: Medication #1:  PRAVASTATIN SODIUM 40 MG TABS (PRAVASTATIN SODIUM) 1 at bedtime [facility],  fax [phone]   (see note about this med at end of 9/12 office visit)  Initial call taken by: Jerolyn Shin,  May 07, 2010 11:27 AM    Prescriptions: PRAVASTATIN SODIUM 40 MG TABS (PRAVASTATIN SODIUM) 1 at bedtime  #90 x 3   Entered by:   Shonna Chock CMA   Authorized by:   Marga Melnick MD   Signed by:   Shonna Chock CMA on 05/07/2010   Method used:   Faxed to ...       [facility] (retail)       [address]       [place], [place]  [address]       Ph: [phone]       Fax: [phone]   RxID:   [id number]

## 2010-08-13 NOTE — Assessment & Plan Note (Signed)
Summary: 4 month ov//ph   Vital Signs:  Patient profile:   55 year old female Weight:      213.6 pounds BMI:     32.60 Pulse rate:   60 / minute Resp:     14 per minute BP sitting:   124 / 80  (left arm) Cuff size:   large  Vitals Entered By: Shonna Chock (August 15, 2009 9:19 AM) CC: 4 Month follow-up, Renew all meds  Comments REVIEWED MED LIST, PATIENT AGREED DOSE AND INSTRUCTION CORRECT    CC:  4 Month follow-up and Renew all meds .  History of Present Illness: Raven [name] has decreased portions & hyperglycemic carbs with 22# weight loss & 4 inch decrease @ waist. FBS 90-103; post meal < 130.No hypoglycemia. A1c down from 6.5 to 6.1%.  Allergies: 1)  ! Pcn 2)  ! Vicodin  Review of Systems General:  Denies chills, fatigue, fever, and sweats. Eyes:  Denies blurring, double vision, and vision loss-both eyes; Last exam 2010 ; no retinopathy. CV:  Denies chest pain or discomfort, leg cramps with exertion, lightheadness, and near fainting. GU:  Complains of dysuria; denies discharge and hematuria; Urgency. MS:  Complains of low back pain and muscle aches. Derm:  Denies poor wound healing. Neuro:  Denies brief paralysis, numbness, tingling, and weakness. Endo:  Denies excessive hunger, excessive thirst, and excessive urination.  Physical Exam  General:  well-nourished,alert,appropriate and cooperative throughout examination Heart:  Normal rate and regular rhythm. S1 and S2 normal without gallop, murmur, click, rub or other extra sounds. Abdomen:  Bowel sounds positive,abdomen soft and non-tender without masses, organomegaly or hernias noted. Msk:  No flank tenderness Pulses:  R and L carotid,radial,dorsalis pedis and posterior tibial pulses are full and equal bilaterally Extremities:  No clubbing, cyanosis, edema.Neg SLR bilateral.   Neurologic:   strength normal in lower extremities; sensation intact to light touch over feet.   Skin:  Intact without suspicious lesions or  rashes Psych:  Focused & intelligent    Impression & Recommendations:  Problem # 1:  DIABETES MELLITUS, CONTROLLED (ICD-250.00) Risk has decreased from 30% to 22% The following medications were removed from the medication list:    Actos 45 Mg Tabs (Pioglitazone hcl) .Marland Kitchen... 1 by mouth once daily    Amaryl 2 Mg Tabs (Glimepiride) .Marland Kitchen... 1/2 once daily Her updated medication list for this problem includes:    Glucophage 1000 Mg Tabs (Metformin hcl) .Marland Kitchen... 1 by mouth two times a day    Lisinopril 20 Mg Tabs (Lisinopril) .Marland Kitchen... 1 qd    Januvia 100 Mg Tabs (Sitagliptin phosphate) .Marland Kitchen... 1 once daily  Problem # 2:  HYPERTENSION (ICD-401.9) Controlled Her updated medication list for this problem includes:    Lisinopril 20 Mg Tabs (Lisinopril) .Marland Kitchen... 1 qd  Problem # 3:  DYSURIA (ICD-788.1)  The following medications were removed from the medication list:    Cipro 500 Mg Tabs (Ciprofloxacin hcl) .Marland Kitchen... 1 by mouth two times a day    Cipro 500 Mg Tabs (Ciprofloxacin hcl) ..... One tablet by mouth two times a day x 7 days. Her updated medication list for this problem includes:    Nitrofurantoin Monohyd Macro 100 Mg Caps (Nitrofurantoin monohyd macro) .Marland Kitchen... 1 two times a day  Orders: T-Culture, Urine ([id number])  Problem # 4:  BACK PAIN, LUMBAR (ICD-724.2)  Her updated medication list for this problem includes:    Cyclobenzaprine Hcl 5 Mg Tabs (Cyclobenzaprine hcl) .Marland Kitchen... 1-2 at bedtime as needed back pain  Complete Medication List: 1)  Glucophage 1000 Mg Tabs (Metformin hcl) .Marland Kitchen.. 1 by mouth two times a day 2)  Astepro 137 Mcg/spray Soln (Azelastine hcl) .... Use 2 sprays in each nostril twice daily 3)  Accu-chek Aviva Strp (Glucose blood) .... Use as directed once daily. 4)  Lisinopril 20 Mg Tabs (Lisinopril) .Marland Kitchen.. 1 qd 5)  Pravastatin Sodium 20 Mg Tabs (Pravastatin sodium) .Marland Kitchen.. 1 qhs 6)  Diazepam 5 Mg Tabs (Diazepam) .Marland Kitchen.. 1 once daily as needed 7)  Joint Juice (glucosamine)  .... Daily 8)   Flax Seed Oil 1000 Mg Caps (Flaxseed (linseed)) .Marland Kitchen.. 1 by mouth two times a day 9)  Sertraline Hcl 50 Mg Tabs (Sertraline hcl) .... 1/2 once daily x 1 week then 1 once daily 10)  Biotin  .Marland KitchenMarland Kitchen. 1 by mouth once daily 11)  Nitrofurantoin Monohyd Macro 100 Mg Caps (Nitrofurantoin monohyd macro) .Marland Kitchen.. 1 two times a day 12)  Januvia 100 Mg Tabs (Sitagliptin phosphate) .Marland Kitchen.. 1 once daily 13)  Cyclobenzaprine Hcl 5 Mg Tabs (Cyclobenzaprine hcl) .Marland Kitchen.. 1-2 at bedtime as needed back pain  Patient Instructions: 1)  Please schedule a follow-up appointment in 4 months. 2)  BUN,creat,K+, AST ,ALT prior to visit, ICD-9: 3)  HbgA1C prior to visit, ICD-9: 4)  Urine Microalbumin prior to visit, ICD-9: 5)  Lipid Panel prior to visit, ICD-9:                                                              AS Tylenol  every 6 hrs  as needed for back  pai with stretching exercises as discussed. Prescriptions: CYCLOBENZAPRINE HCL 5 MG TABS (CYCLOBENZAPRINE HCL) 1-2 at bedtime as needed back pain  #20 x 0   Entered and Authorized by:   Marga Melnick MD   Signed by:   Marga Melnick MD on 08/15/2009   Method used:   Faxed to ...       [facility] (retail)       [address]       [place], [place]  [address]       Ph: [phone]       Fax: [phone]   RxID:   [id number] JANUVIA 100 MG TABS (SITAGLIPTIN PHOSPHATE) 1 once daily  #30 x 3   Entered and Authorized by:   Marga Melnick MD   Signed by:   Marga Melnick MD on 08/15/2009   Method used:   Print then Mail to Patient   RxID:   [id number] NITROFURANTOIN MONOHYD MACRO 100 MG CAPS (NITROFURANTOIN MONOHYD MACRO) 1 two times a day  #14 x 0   Entered and Authorized by:   Marga Melnick MD   Signed by:   Marga Melnick MD on 08/15/2009   Method used:   Faxed to ...       [facility] (retail)       [address]       [place], [place]  [address]       Ph: [phone]       Fax: [phone]   RxID:   [id number]

## 2010-08-13 NOTE — Progress Notes (Signed)
Summary: REFILL  Phone Note Refill Request Message from:  Fax from Pharmacy on [facility] FAX [phone]  ASTEPRO 0.15% NOSE SOL ML  Initial call taken by: Barb Merino,  July 29, 2009 3:40 PM    Prescriptions: ASTEPRO 137 MCG/SPRAY  SOLN (AZELASTINE HCL) use 2 sprays in each nostril twice daily  #1 x 11   Entered by:   Shonna Chock   Authorized by:   Marga Melnick MD   Signed by:   Shonna Chock on 07/29/2009   Method used:   Faxed to ...       [facility] (retail)       [address]       [place], [place]  [address]       Ph: [phone]       Fax: [phone]   RxID:   [id number]

## 2010-08-13 NOTE — Progress Notes (Signed)
Summary: Refill Request  Phone Note Refill Request Message from:  Pharmacy on Nov 11, 2009 9:59 AM  Refills Requested: Medication #1:  GLUCOPHAGE 1000 MG  TABS 1 by mouth two times a day   Dosage confirmed as above?Dosage Confirmed   Brand Name Necessary? No   Supply Requested: 3 months   Last Refilled: 06/20/2010 [facility]  Next Appointment Scheduled: 6.3.11 Initial call taken by: Harold Barban,  Nov 11, 2009 9:59 AM    Prescriptions: GLUCOPHAGE 1000 MG  TABS (METFORMIN HCL) 1 by mouth two times a day  #180 x 2   Entered by:   Shonna Chock   Authorized by:   Marga Melnick MD   Signed by:   Shonna Chock on 11/11/2009   Method used:   Printed then faxed to ...       [facility] (retail)       [address]       [place], [place]  [address]       Ph: [phone]       Fax: [phone]   RxID:   [id number]

## 2010-08-13 NOTE — Progress Notes (Signed)
Summary: REFILL REQUEST  Phone Note Refill Request Call back at [phone] Message from:  Pharmacy on February 23, 2010 10:49 AM  Refills Requested: Medication #1:  SERTRALINE HCL 50 MG TABS 1/2 once daily X 1 week then 1 once daily   Dosage confirmed as above?Dosage Confirmed   Supply Requested: 3 months   Last Refilled: 11/12/2009 [facility]  Next Appointment Scheduled: 03/17/10 Initial call taken by: Lavell Islam,  February 23, 2010 10:49 AM    Prescriptions: SERTRALINE HCL 50 MG TABS (SERTRALINE HCL) 1/2 once daily X 1 week then 1 once daily  #90 x 1   Entered by:   Shonna Chock CMA   Authorized by:   Marga Melnick MD   Signed by:   Shonna Chock CMA on 02/23/2010   Method used:   Faxed to ...       [facility] (retail)       [address]       [place], [place]  [address]       Ph: [phone]       Fax: [phone]   RxID:   [id number]

## 2010-09-21 ENCOUNTER — Encounter (INDEPENDENT_AMBULATORY_CARE_PROVIDER_SITE_OTHER): Payer: Self-pay | Admitting: *Deleted

## 2010-09-21 ENCOUNTER — Other Ambulatory Visit: Payer: Self-pay | Admitting: Internal Medicine

## 2010-09-21 ENCOUNTER — Other Ambulatory Visit (INDEPENDENT_AMBULATORY_CARE_PROVIDER_SITE_OTHER): Payer: PRIVATE HEALTH INSURANCE

## 2010-09-21 DIAGNOSIS — T887XXA Unspecified adverse effect of drug or medicament, initial encounter: Secondary | ICD-10-CM

## 2010-09-21 DIAGNOSIS — I10 Essential (primary) hypertension: Secondary | ICD-10-CM

## 2010-09-21 DIAGNOSIS — E119 Type 2 diabetes mellitus without complications: Secondary | ICD-10-CM

## 2010-09-21 LAB — HEMOGLOBIN A1C: Hgb A1c MFr Bld: 6.2 % (ref 4.6–6.5)

## 2010-09-21 LAB — BUN: BUN: 15 mg/dL (ref 6–23)

## 2010-09-21 LAB — POTASSIUM: Potassium: 4.4 mEq/L (ref 3.5–5.1)

## 2010-09-21 LAB — LIPID PANEL
LDL Cholesterol: 110 mg/dL — ABNORMAL HIGH (ref 0–99)
Total CHOL/HDL Ratio: 4
Triglycerides: 106 mg/dL (ref 0.0–149.0)
VLDL: 21.2 mg/dL (ref 0.0–40.0)

## 2010-09-21 LAB — CREATININE, SERUM: Creatinine, Ser: 0.7 mg/dL (ref 0.4–1.2)

## 2010-09-21 LAB — HEPATIC FUNCTION PANEL
Bilirubin, Direct: 0 mg/dL (ref 0.0–0.3)
Total Bilirubin: 0.3 mg/dL (ref 0.3–1.2)

## 2010-09-21 LAB — MICROALBUMIN / CREATININE URINE RATIO
Creatinine,U: 78.4 mg/dL
Microalb Creat Ratio: 0.6 mg/g (ref 0.0–30.0)
Microalb, Ur: 0.5 mg/dL (ref 0.0–1.9)

## 2010-09-28 ENCOUNTER — Encounter: Payer: Self-pay | Admitting: Internal Medicine

## 2010-09-28 ENCOUNTER — Ambulatory Visit (INDEPENDENT_AMBULATORY_CARE_PROVIDER_SITE_OTHER): Payer: PRIVATE HEALTH INSURANCE | Admitting: Internal Medicine

## 2010-09-28 DIAGNOSIS — E119 Type 2 diabetes mellitus without complications: Secondary | ICD-10-CM

## 2010-09-28 DIAGNOSIS — K219 Gastro-esophageal reflux disease without esophagitis: Secondary | ICD-10-CM

## 2010-09-28 DIAGNOSIS — R079 Chest pain, unspecified: Secondary | ICD-10-CM

## 2010-10-08 NOTE — Assessment & Plan Note (Signed)
Summary: rto 6 months/cbs   Vital Signs:  Patient profile:   55 year old female Height:      69 inches Weight:      206.6 pounds BMI:     30.62 Pulse rate:   63 / minute Resp:     12 per minute BP sitting:   135 / 78  (left arm) Cuff size:   large  Vitals Entered By: Shonna Chock CMA (September 28, 2010 9:02 AM) CC: 6 month follow-up (patient with mailed copy of labs), Type 2 diabetes mellitus follow-up   CC:  6 month follow-up (patient with mailed copy of labs) and Type 2 diabetes mellitus follow-up.  History of Present Illness:    Raven [name] is here for DM F/U but  has had intermittent "chest heaviness"  for 3 months which she feels is stress related due to family health issues (Mother-in -law with end stage COPD has moved in wth them).    She  reports self managed hypoglycemia, but denies polyuria, polydipsia, blurred vision, weight loss, weight gain, and numbness of extremities.   The patient denies the following symptoms: neuropathic pain, vomiting, orthostatic symptoms, poor wound healing, intermittent claudication, vision loss, and foot ulcer.  Since the last visit the patient reports good dietary compliance, not exercising regularly(  because of care  giving duties for her mother-in-law). She is monitoring blood glucose.  The patient has been measuring capillary blood glucose before breakfast ( 100-113) ; the  range 2 hrs after any meal   is 104-120 . Since the last visit, the patient reports having had no eye care.  A1c now 6.2%; LDL 110.  Her A1c previously was 7.3% ; prior to her aggressive diabetic management program.    She  reports resting chest pain, but denies nausea, diaphoresis, shortness of breath, palpitations, dizziness, syncope, and indigestion.  The pain is located in the substernal area and the pain does not radiate.  Episodes of chest pain  can  last >30 minutes.  The pain has no triggers or relievers. The onset of the pain corresponds to the period that her mother-in-law has    been living with her. Additionally she has started drinking coffee  daily in that same  time frame.  Current Medications (verified): 1)  Glucophage 1000 Mg  Tabs (Metformin Hcl) .... 1/2  Two Times A Day With 2 Largest Meals 2)  Astepro 137 Mcg/spray  Soln (Azelastine Hcl) .... Use 2 Sprays in Each Nostril Twice Daily As Needed 3)  Accu-Chek Aviva   Strp (Glucose Blood) .... Use As Directed Once Daily. 4)  Lisinopril 20 Mg  Tabs (Lisinopril) .Marland Kitchen.. 1 Qd 5)  Pravastatin Sodium 40 Mg Tabs (Pravastatin Sodium) .Marland Kitchen.. 1 At Bedtime 6)  Diazepam 5 Mg Tabs (Diazepam) .Marland Kitchen.. 1 Once Daily As Needed 7)  Fish Oil 1000 Mg Caps (Omega-3 Fatty Acids) .Marland Kitchen.. 1 By Mouth Two Times A Day 8)  Sertraline Hcl 50 Mg Tabs (Sertraline Hcl) .Marland Kitchen.. 1 Once Daily 9)  Biotin 1000 Mcg Tabs (Biotin) .Marland Kitchen.. 1 By Mouth Once Daily 10)  Nitrofurantoin Monohyd Macro 100 Mg Caps (Nitrofurantoin Monohyd Macro) .Marland Kitchen.. 1 Two Times A Day 11)  Januvia 100 Mg Tabs (Sitagliptin Phosphate) .Marland Kitchen.. 1 Once Daily 12)  Cetirizine Hcl 10 Mg Tabs (Cetirizine Hcl) .Marland Kitchen.. 1 By Mouth Once Daily 13)  Aleve 220 Mg Tabs (Naproxen Sodium) .Marland Kitchen.. 1 By Mouth Once Daily 14)  Vitamin B-12 500 Mcg Tabs (Cyanocobalamin) .Marland Kitchen.. 1 By Mouth Once Daily 15)  Vitamin  B-6 100 Mg Tabs (Pyridoxine Hcl) .Marland Kitchen.. 1 By Mouth Once Daily 16)  Clobetasol Propionate 0.05 % Crea (Clobetasol Propionate) .... Two Times A Day As Needed 17)  Vagifem 10 Mcg Tabs (Estradiol) .... M/w/f 18)  Estrace 0.1 Mg/gm Crea (Estradiol) .... As Needed  Allergies: 1)  ! Pcn 2)  ! Vicodin  Social History: 2 cups of coffee /day X 3 months Occupation: Optician, dispensing Married Never Smoked Alcohol use-no Regular exercise-no Religion affecting care ;"It allows stress management"  Review of Systems GI:  Denies bloody stools and dark tarry stools; Some reflux with meals; dysphagia.  Physical Exam  General:  well-nourished,in no acute distress; alert,appropriate and cooperative throughout examination Eyes:  No  corneal or conjunctival inflammation noted. No icterus Mouth:  Oral mucosa and oropharynx without lesions or exudates.  Teeth in good repair. No pharyngeal erythema.   Lungs:  Normal respiratory effort, chest expands symmetrically. Lungs are clear to auscultation, no crackles or wheezes. Heart:  Normal rate and regular rhythm. S1 and S2 normal without gallop, murmur, click, rub.S4 Abdomen:  Bowel sounds positive,abdomen soft and non-tender without masses, organomegaly or hernias noted. Pulses:  R and L carotid,radial,dorsalis pedis and posterior tibial pulses are full and equal bilaterally Extremities:  No clubbing, cyanosis, edema, or deformity noted .Good nail health Neurologic:  alert & oriented X3 and sensation intact to light touch over feet.   Skin:  Intact without suspicious lesions or rashes. No jaundice Cervical Nodes:  No lymphadenopathy noted Axillary Nodes:  No palpable lymphadenopathy Psych:  memory intact for recent and remote, normally interactive, good eye contact, and not anxious appearing.     Impression & Recommendations:  Problem # 1:  CHEST PAIN (ICD-786.50)  probable ERD from stress & coffee  Orders: EKG w/ Interpretation (93000)  Problem # 2:  DIABETES MELLITUS, CONTROLLED (ICD-250.00)  The following medications were removed from the medication list:    Aspirin 81 Mg Tabs (Aspirin) .Marland KitchenMarland KitchenMarland KitchenMarland Kitchen 3 by mouth in the am Her updated medication list for this problem includes:    Glucophage 1000 Mg Tabs (Metformin hcl) .Marland Kitchen... 1/2  two times a day with 2 largest meals    Lisinopril 20 Mg Tabs (Lisinopril) .Marland Kitchen... 1 qd    Januvia 100 Mg Tabs (Sitagliptin phosphate) .Marland Kitchen... 1/2 once daily  Problem # 3:  GERD (ICD-530.81)  The following medications were removed from the medication list:    Ranitidine Hcl 150 Mg Tabs (Ranitidine hcl) .Marland Kitchen... 1 by mouth once daily Her updated medication list for this problem includes:    Ranitidine Hcl 150 Mg Tabs (Ranitidine hcl) .Marland Kitchen... 1 two times a  day as needed  Complete Medication List: 1)  Glucophage 1000 Mg Tabs (Metformin hcl) .... 1/2  two times a day with 2 largest meals 2)  Astepro 137 Mcg/spray Soln (Azelastine hcl) .... Use 2 sprays in each nostril twice daily as needed 3)  Accu-chek Aviva Strp (Glucose blood) .... Use as directed once daily. 4)  Lisinopril 20 Mg Tabs (Lisinopril) .Marland Kitchen.. 1 qd 5)  Diazepam 5 Mg Tabs (Diazepam) .Marland Kitchen.. 1 once daily as needed 6)  Fish Oil 1000 Mg Caps (Omega-3 fatty acids) .Marland Kitchen.. 1 by mouth two times a day 7)  Sertraline Hcl 50 Mg Tabs (Sertraline hcl) .Marland Kitchen.. 1 once daily 8)  Biotin 1000 Mcg Tabs (Biotin) .Marland Kitchen.. 1 by mouth once daily 9)  Nitrofurantoin Monohyd Macro 100 Mg Caps (Nitrofurantoin monohyd macro) .Marland Kitchen.. 1 two times a day 10)  Januvia 100 Mg Tabs (Sitagliptin phosphate) .Marland KitchenMarland KitchenMarland Kitchen  1/2 once daily 11)  Cetirizine Hcl 10 Mg Tabs (Cetirizine hcl) .Marland Kitchen.. 1 by mouth once daily 12)  Aleve 220 Mg Tabs (Naproxen sodium) .Marland Kitchen.. 1 by mouth once daily 13)  Vitamin B-12 500 Mcg Tabs (Cyanocobalamin) .Marland Kitchen.. 1 by mouth once daily 14)  Vitamin B-6 100 Mg Tabs (Pyridoxine hcl) .Marland Kitchen.. 1 by mouth once daily 15)  Clobetasol Propionate 0.05 % Crea (Clobetasol propionate) .... Two times a day as needed 16)  Vagifem 10 Mcg Tabs (Estradiol) .... M/w/f 17)  Estrace 0.1 Mg/gm Crea (Estradiol) .... As needed 18)  Simvastatin 40 Mg Tabs (Simvastatin) .Marland Kitchen.. 1 at bedtime 19)  Ranitidine Hcl 150 Mg Tabs (Ranitidine hcl) .Marland Kitchen.. 1 two times a day as needed  Patient Instructions: 1)  Resume ranitidine two times a day as needed . 2)  Avoid foods high in acid (tomatoes, citrus juices, spicy foods). Avoid eating within two hours of lying down or before exercising. Do not over eat; try smaller more frequent meals. Elevate head of bed twelve inches when sleeping. 3)  Please schedule a follow-up appointment in 3  months. 4)  BUN,creat,K+ prior to visit, ICD-9:401.9 5)  Hepatic Panel prior to visit, ICD-9:995.20 6)  Lipid Panel prior to visit,  ICD-9:272.4 7)  HbgA1C prior to visit, ICD-9:250.00 8)  Urine Microalbumin prior to visit, ICD-9:250.00 Prescriptions: RANITIDINE HCL 150 MG TABS (RANITIDINE HCL) 1 two times a day as needed  #180 x 1   Entered and Authorized by:   Marga Melnick MD   Signed by:   Marga Melnick MD on 09/28/2010   Method used:   Electronically to        [facility]* (retail)       [address]       [address] [place] [address]       [place], [place]  [address]       Ph: [phone]       Fax: [phone]   RxID:   [id number] DIAZEPAM 5 MG TABS (DIAZEPAM) 1 once daily as needed  #30 x 5   Entered and Authorized by:   Marga Melnick MD   Signed by:   Marga Melnick MD on 09/28/2010   Method used:   Print then Give to Patient   RxID:   [id number] SERTRALINE HCL 50 MG TABS (SERTRALINE HCL) 1 once daily  #90 x 1   Entered and Authorized by:   Marga Melnick MD   Signed by:   Marga Melnick MD on 09/28/2010   Method used:   Electronically to        [facility]* (retail)       [address]       [address] [place] [address]mail[address]       [place], [place]  [address]       Ph: [phone]       Fax: [phone]   RxID:   [id number] LISINOPRIL 20 MG  TABS (LISINOPRIL) 1 qd  #90 Each x 3   Entered and Authorized by:   Marga Melnick MD   Signed by:   Marga Melnick MD on 09/28/2010   Method used:   Electronically to        [facility]* (retail)       [address]       [address]mail[address]       [place], [place]  [address]       Ph: [phone]  Fax: [phone]   RxID:   [id number] GLUCOPHAGE 1000 MG  TABS (METFORMIN HCL) 1/2  two times a day with 2 largest meals  #90 x 3   Entered and Authorized by:   Marga Melnick MD   Signed by:   Marga Melnick MD on 09/28/2010   Method used:   Electronically to        [facility]* (retail)       [address]       [address]  Shipping/mailing       [place], [place]  [address]       Ph: [phone]       Fax: [phone]   RxID:   [id number] JANUVIA 100 MG TABS (SITAGLIPTIN PHOSPHATE) 1/2 once daily  #90 x 0   Entered and Authorized by:   Marga Melnick MD   Signed by:   Marga Melnick MD on 09/28/2010   Method used:   Electronically to        [facility]* (retail)       [address]       [address] [place] [address]mail[address]       [place], [place]  [address]       Ph: [phone]       Fax: [phone]   RxID:   [id number] SIMVASTATIN 40 MG TABS (SIMVASTATIN) 1 at bedtime  #90 x 1   Entered and Authorized by:   Marga Melnick MD   Signed by:   Marga Melnick MD on 09/28/2010   Method used:   Electronically to        [facility]* (retail)       [address]       [address]mailing       [place], [place]  [address]       Ph: [phone]       Fax: [phone]   RxID:   [id number]    Orders Added: 1)  Est. Patient Level IV [82993] 2)  EKG w/ Interpretation [93000]

## 2010-12-14 ENCOUNTER — Telehealth: Payer: Self-pay | Admitting: Internal Medicine

## 2010-12-14 MED ORDER — GLUCOSE BLOOD VI STRP: ORAL_STRIP | Status: DC

## 2010-12-14 NOTE — Telephone Encounter (Signed)
meds sent in

## 2010-12-18 ENCOUNTER — Other Ambulatory Visit: Payer: Self-pay | Admitting: Internal Medicine

## 2010-12-18 DIAGNOSIS — E119 Type 2 diabetes mellitus without complications: Secondary | ICD-10-CM

## 2010-12-18 DIAGNOSIS — E785 Hyperlipidemia, unspecified: Secondary | ICD-10-CM

## 2010-12-18 DIAGNOSIS — I10 Essential (primary) hypertension: Secondary | ICD-10-CM

## 2010-12-18 DIAGNOSIS — T887XXA Unspecified adverse effect of drug or medicament, initial encounter: Secondary | ICD-10-CM

## 2010-12-21 ENCOUNTER — Other Ambulatory Visit (INDEPENDENT_AMBULATORY_CARE_PROVIDER_SITE_OTHER): Payer: PRIVATE HEALTH INSURANCE

## 2010-12-21 DIAGNOSIS — T887XXA Unspecified adverse effect of drug or medicament, initial encounter: Secondary | ICD-10-CM

## 2010-12-21 DIAGNOSIS — E785 Hyperlipidemia, unspecified: Secondary | ICD-10-CM

## 2010-12-21 DIAGNOSIS — I10 Essential (primary) hypertension: Secondary | ICD-10-CM

## 2010-12-21 LAB — HEPATIC FUNCTION PANEL
ALT: 23 U/L (ref 0–35)
AST: 22 U/L (ref 0–37)
Albumin: 4.1 g/dL (ref 3.5–5.2)
Total Protein: 6.9 g/dL (ref 6.0–8.3)

## 2010-12-21 LAB — MICROALBUMIN / CREATININE URINE RATIO
Creatinine,U: 105.3 mg/dL
Microalb Creat Ratio: 0.7 mg/g (ref 0.0–30.0)
Microalb, Ur: 0.7 mg/dL (ref 0.0–1.9)

## 2010-12-21 LAB — LIPID PANEL: Cholesterol: 159 mg/dL (ref 0–200)

## 2010-12-21 NOTE — Progress Notes (Signed)
Labs only

## 2010-12-24 ENCOUNTER — Encounter: Payer: Self-pay | Admitting: Internal Medicine

## 2010-12-25 ENCOUNTER — Ambulatory Visit (INDEPENDENT_AMBULATORY_CARE_PROVIDER_SITE_OTHER): Payer: PRIVATE HEALTH INSURANCE | Admitting: Internal Medicine

## 2010-12-25 ENCOUNTER — Encounter: Payer: Self-pay | Admitting: Internal Medicine

## 2010-12-25 DIAGNOSIS — J309 Allergic rhinitis, unspecified: Secondary | ICD-10-CM

## 2010-12-25 DIAGNOSIS — I10 Essential (primary) hypertension: Secondary | ICD-10-CM

## 2010-12-25 DIAGNOSIS — E785 Hyperlipidemia, unspecified: Secondary | ICD-10-CM

## 2010-12-25 DIAGNOSIS — F411 Generalized anxiety disorder: Secondary | ICD-10-CM

## 2010-12-25 DIAGNOSIS — M2550 Pain in unspecified joint: Secondary | ICD-10-CM

## 2010-12-25 DIAGNOSIS — E119 Type 2 diabetes mellitus without complications: Secondary | ICD-10-CM

## 2010-12-25 MED ORDER — AZELASTINE HCL 0.1 % NA SOLN: 2.0000 | Freq: Two times a day (BID) | NASAL | Status: DC

## 2010-12-25 MED ORDER — LISINOPRIL 20 MG PO TABS: 20.0000 mg | ORAL_TABLET | Freq: Every day | ORAL | Status: DC

## 2010-12-25 MED ORDER — SITAGLIPTIN PHOSPHATE 100 MG PO TABS: 50.0000 mg | ORAL_TABLET | Freq: Every day | ORAL | Status: DC

## 2010-12-25 MED ORDER — GLUCOSE BLOOD VI STRP: ORAL_STRIP | Status: DC

## 2010-12-25 MED ORDER — METFORMIN HCL 1000 MG PO TABS: 500.0000 mg | ORAL_TABLET | Freq: Two times a day (BID) | ORAL | Status: DC

## 2010-12-25 MED ORDER — SIMVASTATIN 40 MG PO TABS: 40.0000 mg | ORAL_TABLET | Freq: Every evening | ORAL | Status: DC

## 2010-12-25 MED ORDER — SERTRALINE HCL 50 MG PO TABS: 50.0000 mg | ORAL_TABLET | Freq: Every day | ORAL | Status: DC

## 2010-12-25 NOTE — Patient Instructions (Signed)
Preventive Health Care: Exercise  30-45  minutes a day, 3-4 days a week. Walking is especially valuable in preventing Osteoporosis. Eat a low-fat diet with lots of fruits and vegetables, up to 7-9 servings per day. Avoid obesity; your goal = waist less than 35 inches.Consume less than 30 grams of sugar per day from foods & drinks with High Fructose Corn Syrup as #2,3 or #4 on label. Please  schedule Labs in 6 months : A1c (250.00) . Uric acid if toe pain persists; use Aleve for toe.

## 2010-12-25 NOTE — Progress Notes (Signed)
Subjective:    Patient ID: Raven [name], female    DOB: 08-02-1955, 55 y.o.   MRN: [id number]  HPI  #1 Diabetes status assessment: Fasting or morning glucose range:  74-110; average :  100  . Highest glucose 2 hours after any meal:  162. Hypoglycemia :  2 in afternoon when she missed lunch .                                                     Excess  thirst,  hunger, urination:  no.                                  Lightheadedness with standing:  no. Chest pain:  no ; Palpitations :no ;  Pain in  calves with walking:  no .                                                                                                                     Non healing skin  ulcers or sores,especially over the feet:  no. Numbness or tingling or burning in feet : no .                                                                                                                                              Significant change in  Weight : 12 #. Vision changes : no  .                                                                    Exercise : decreased due to  Family health demands . Nutrition/diet:  Eating out alot. Medication compliance : yes. Medication adverse  Effects:  no . Eye exam : due. Foot care : no.  A1c/ urine microalbumin monitor:  6.6% #2    Dyslipidemia assessment: Prior Advanced Lipid Testing: NMR Lipoprofile.   Family history of premature CAD/ MI: father @  55; 2 paternal uncles in 44s .  HTN: controlled. Smoking history  : never .    ROS: fatigue: no ; abd pain/bowel changes: no ; myalgias:no;  syncope : no ; memory loss: no;skin changes: no. Lab results reviewed :HDL 45, LDL 99.     Review of Systems Pain & swelling in 3rd L  toe X 4 weeks; Naprosyn w/o benefit     Objective:   Physical Exam Gen.: Healthy and well-nourished in appearance. Alert, appropriate and cooperative throughout exam. Eyes: No corneal or conjunctival inflammation noted.   Neck: No deformities, masses,  or tenderness noted. Thyroid normal. Lungs: Normal respiratory effort; chest expands symmetrically. Lungs are clear to auscultation without rales, wheezes, or increased work of breathing. Heart: Normal rate and rhythm. Normal S1 and S2. No gallop, click, or rub. Grade 1/2 - 1 systolic murmur. Abdomen: Bowel sounds normal; abdomen soft and nontender. No masses, organomegaly or hernias noted. No clubbing, cyanosis, edema, or deformity noted. Pain with flexion 3rd toe.Nail health  good. Vascular: Carotid, radial artery, dorsalis pedis and dorsalis posterior tibial pulses are full and equal. No bruits present. Neurologic: Alert and oriented x3. Deep tendon reflexes symmetrical and normal.          Skin: Intact without suspicious lesions or rashes.Minimal erythema L 3rd toe Psych: Mood and affect are normal. Normally interactive                                                                                        Assessment & Plan:  #1 see Problem List & assessments #2 toe pain Plan: see orders

## 2010-12-28 ENCOUNTER — Ambulatory Visit: Payer: PRIVATE HEALTH INSURANCE | Admitting: Internal Medicine

## 2011-03-03 ENCOUNTER — Ambulatory Visit (INDEPENDENT_AMBULATORY_CARE_PROVIDER_SITE_OTHER)
Admission: RE | Admit: 2011-03-03 | Discharge: 2011-03-03 | Disposition: A | Discharge status: Home / Self Care | Payer: PRIVATE HEALTH INSURANCE | Source: Ambulatory Visit | Attending: Internal Medicine | Admitting: Internal Medicine

## 2011-03-03 ENCOUNTER — Encounter: Payer: Self-pay | Admitting: Internal Medicine

## 2011-03-03 ENCOUNTER — Ambulatory Visit (INDEPENDENT_AMBULATORY_CARE_PROVIDER_SITE_OTHER): Payer: PRIVATE HEALTH INSURANCE | Admitting: Internal Medicine

## 2011-03-03 VITALS — BP 120/84 | HR 70 | Temp 98.7°F | Wt 212.0 lb

## 2011-03-03 DIAGNOSIS — J209 Acute bronchitis, unspecified: Secondary | ICD-10-CM

## 2011-03-03 DIAGNOSIS — R091 Pleurisy: Secondary | ICD-10-CM

## 2011-03-03 MED ORDER — LEVOFLOXACIN 500 MG PO TABS: 500.0000 mg | ORAL_TABLET | Freq: Every day | ORAL | Status: AC

## 2011-03-03 NOTE — Progress Notes (Signed)
  Subjective:    Patient ID: Raven [name], female    DOB: 09-20-1955, 55 y.o.   MRN: [id number]  HPI Cough Onset:since Spring, more green in past week Extrinsic symptoms:itchy eyes, sneezing:yes  Infectious symptoms :fever, purulent secretions :green from chest Chest symptoms:some pleuritic  pain,  Hemoptysis,dyspnea. Occasional wheezing: GI symptoms: Dyspepsia, reflux:not with Ranitidine Occupational/environmental exposures:no Smoking:never ACE inhibitor:no Treatment/efficacy:Zyrtec, Neti rinse Past medical history/family history pulmonary disease: father TB, emphysema    Review of Systems all nasal drainage has been clear. She has occasional frontal headaches. She's also had some dental pain.      Objective:   Physical Exam General appearance is of good health and nourishment; no acute distress or increased work of breathing is present.  No  lymphadenopathy about the head, neck, or axilla noted.   Eyes: No conjunctival inflammation or lid edema is present.  Ears:  External ear exam shows no significant lesions or deformities.  Otoscopic examination reveals clear canals, tympanic membranes are intact bilaterally without bulging, retraction, inflammation or discharge.  Nose:  External nasal examination shows no deformity or inflammation. Nasal mucosa are pink and moist without lesions or exudates. Minimal  septal dislocation No obstruction to airflow.   Oral exam: Dental hygiene is good; lips and gums are healthy appearing.There is no oropharyngeal erythema or exudate noted.   Heart:  Normal rate and regular rhythm. S1 and S2 normal without gallop, murmur, click, rub or other extra sounds.   Lungs:Chest clear to auscultation; no wheezes, rhonchi,rales ,or rubs present.No increased work of breathing.    Extremities:  No cyanosis, edema, or clubbing  noted    Skin: Warm & dry          Assessment & Plan:  #1  #1 bronchit #1  #1 bronchitis , protracted #2 pleurisy #3  URT symptoms w/o rhinosinusitis criteria Plan: CXray , antibiotics

## 2011-03-03 NOTE — Patient Instructions (Signed)
Plain Mucinex for thick secretions ;force NON dairy fluids for next 48 hrs. Use a Neti pot daily as needed for sinus congestion 

## 2011-03-19 ENCOUNTER — Ambulatory Visit (INDEPENDENT_AMBULATORY_CARE_PROVIDER_SITE_OTHER): Payer: PRIVATE HEALTH INSURANCE | Admitting: Internal Medicine

## 2011-03-19 ENCOUNTER — Encounter: Payer: Self-pay | Admitting: Internal Medicine

## 2011-03-19 VITALS — BP 126/88 | HR 76 | Temp 98.3°F | Wt 212.4 lb

## 2011-03-19 DIAGNOSIS — K219 Gastro-esophageal reflux disease without esophagitis: Secondary | ICD-10-CM

## 2011-03-19 MED ORDER — OMEPRAZOLE MAGNESIUM 20 MG PO TBEC: 20.0000 mg | DELAYED_RELEASE_TABLET | ORAL | Status: DC

## 2011-03-19 NOTE — Patient Instructions (Signed)
The triggers for dyspepsia or "heart burn"  include stress; the "aspirin family" ; alcohol; peppermint; and caffeine (coffee, tea, cola, and chocolate). The aspirin family would include aspirin and the nonsteroidal agents such as ibuprofen &  Naproxen. Tylenol would not cause reflux. If having dyspepsia ; food & drink should be avoided for @ least 2 hours before going to bed.  

## 2011-03-19 NOTE — Progress Notes (Signed)
  Subjective:    Patient ID: Raven [name], female    DOB: 1955-08-17, 55 y.o.   MRN: [id number]  HPI GERD Exacerbation: Location: mainly SS area & burning in throat Onset: indigestion since 08/2010   Radiation: from epigastrium to areas delineated above  Severity: up to  8 Quality: burning  Duration: hour  Better with: no relievers ; on H2 blocker 150 mg twice a day since 2/12  Worse with: on recent cruise Symptoms Nausea/Vomiting: yes while on the cruise, 4 hrs after meal  Diarrhea: no  Constipation: no  Melena/BRBPR: no  Hematemesis: no  Anorexia: yes; "I smell food & throat closes up"  Fever/Chills: no,  EtOH use: mixed drink daily on cruise  NSAIDs/ASA: yes, Aleve 1 daily   Past Surgeries: colonoscopy negative; no PMH of upper endoscopy FH: Her father had bleeding ulcers; her mother had esophagitis and colon polyps.       Review of Systems     Objective:   Physical Exam General appearance is one of good health and nourishment w/o distress.  Eyes: No conjunctival inflammation or scleral icterus is present.  Oral exam: Dental hygiene is good; lips and gums are healthy appearing.There is no oropharyngeal erythema or exudate noted.   Heart:  Normal rate and regular rhythm. S1 and S2 normal without gallop, murmur, click, rub or other extra sounds     Lungs:Chest clear to auscultation; no wheezes, rhonchi,rales ,or rubs present.No increased work of breathing.   Abdomen: bowel sounds normal, soft and non-tender without masses, organomegaly or hernias noted.  No guarding or rebound   Skin:Warm & dry.  Intact without suspicious lesions or rashes ; no jaundice or tenting  Lymphatic: No lymphadenopathy is noted about the head, neck, axilla Vascular: All pulses intact without bruits. No ischemic changes Homans sign is negative bilaterally             Assessment & Plan:  #1 substernal chest discomfort and throat burning; this is undoubtedly severe reflux.  Problematic is that she is taking ranitidine twice a day. Possible triggers include recent change in diet while on the cruise; daily Aleve; and a recent alcohol intake, although minimal.  Plan: #1 EKG : no ischemic changes  #2 protein pump inhibitor as Prilosec OTC twice a day before meals. The severity of her symptoms and positive family history indicates GI referral appropriate.

## 2011-03-22 ENCOUNTER — Encounter: Payer: Self-pay | Admitting: Internal Medicine

## 2011-03-22 ENCOUNTER — Other Ambulatory Visit: Payer: Self-pay | Admitting: Internal Medicine

## 2011-03-22 ENCOUNTER — Telehealth: Payer: Self-pay | Admitting: *Deleted

## 2011-03-22 DIAGNOSIS — K219 Gastro-esophageal reflux disease without esophagitis: Secondary | ICD-10-CM

## 2011-03-22 NOTE — Telephone Encounter (Signed)
Pt called to advise Dr hopper that her colonoscopy was done by Dr Doran Heater.

## 2011-03-23 ENCOUNTER — Telehealth: Payer: Self-pay

## 2011-03-23 NOTE — Telephone Encounter (Signed)
Pt checking status of GI referral. Pt aware referral placed and in process

## 2011-03-25 ENCOUNTER — Other Ambulatory Visit: Payer: Self-pay | Admitting: Internal Medicine

## 2011-03-25 DIAGNOSIS — E785 Hyperlipidemia, unspecified: Secondary | ICD-10-CM

## 2011-03-25 MED ORDER — SIMVASTATIN 40 MG PO TABS: 40.0000 mg | ORAL_TABLET | Freq: Every evening | ORAL | Status: DC

## 2011-03-25 NOTE — Telephone Encounter (Signed)
Rx sent to pharmacy   

## 2011-04-19 HISTORY — PX: ESOPHAGOGASTRODUODENOSCOPY: SHX1529

## 2011-05-13 ENCOUNTER — Encounter: Payer: Self-pay | Admitting: Internal Medicine

## 2011-07-07 ENCOUNTER — Ambulatory Visit (INDEPENDENT_AMBULATORY_CARE_PROVIDER_SITE_OTHER): Payer: PRIVATE HEALTH INSURANCE | Admitting: Internal Medicine

## 2011-07-07 ENCOUNTER — Encounter: Payer: Self-pay | Admitting: Internal Medicine

## 2011-07-07 VITALS — BP 128/84 | HR 86 | Temp 98.4°F | Wt 211.2 lb

## 2011-07-07 DIAGNOSIS — J040 Acute laryngitis: Secondary | ICD-10-CM

## 2011-07-07 DIAGNOSIS — J069 Acute upper respiratory infection, unspecified: Secondary | ICD-10-CM

## 2011-07-07 DIAGNOSIS — J209 Acute bronchitis, unspecified: Secondary | ICD-10-CM

## 2011-07-07 MED ORDER — FLUTICASONE-SALMETEROL 100-50 MCG/DOSE IN AEPB: 1.0000 | INHALATION_SPRAY | Freq: Two times a day (BID) | RESPIRATORY_TRACT | Status: DC

## 2011-07-07 MED ORDER — CLARITHROMYCIN ER 500 MG PO TB24: 1000.0000 mg | ORAL_TABLET | Freq: Every day | ORAL | Status: AC

## 2011-07-07 NOTE — Progress Notes (Signed)
  Subjective:    Patient ID: Raven [name], female    DOB: [birth year], 55 y.o.   MRN: [id number]  HPI Respiratory tract infection Onset/symptoms:12/23 as rattly cough  Exposures (illness/environmental/extrinsic):no Progression of symptoms:to voice loss & rhinitis Treatments/response:Robitussin with Guaifenesin Present symptoms: Fever/chills/sweats:night sweats Frontal headache:no Facial pain:no Nasal purulence:green Sore throat:no Dental painno Lymphadenopathy:no Wheezing/shortness of breath:both Cough/sputum/hemoptysis:sputum is clear Pleuritic pain:no Associated extrinsic/allergic symptoms:itchy eyes/ sneezing:no Past medical history: Seasonal allergies ; yes /asthma:no Smoking history:never           Review of Systems     Objective:   Physical Exam General appearance is of good health and nourishment; no acute distress or increased work of breathing is present.  No  lymphadenopathy about the head, neck, or axilla noted.   Eyes: No conjunctival inflammation or lid edema is present..  Ears:  External ear exam shows no significant lesions or deformities.  Otoscopic examination reveals clear canals, tympanic membranes are intact bilaterally without bulging, retraction, inflammation or discharge.  Nose:  External nasal examination shows no deformity or inflammation. Nasal mucosa are pink and moist without lesions or exudates. No septal dislocation .No obstruction to airflow.   Oral exam: Dental hygiene is good; lips and gums are healthy appearing.There is no oropharyngeal erythema or exudate noted. Very hoarse    Heart:  Normal rate and regular rhythm. S1 and S2 normal without gallop, murmur, click, rub . S 4 Lungs:Chest clear to auscultation; no wheezes, rhonchi,rales ,or rubs present.No increased work of breathing.    Extremities:  No cyanosis, edema, or clubbing  noted    Skin: Warm & dry          Assessment & Plan:   #1 respiratory tract infection  with productive cough , purulent nasal discharge, and laryngitis  Plan: See orders recommendations.

## 2011-07-07 NOTE — Patient Instructions (Addendum)
Plain Mucinex for thick secretions ;force NON dairy fluids . Use a Neti pot daily as needed for sinus congestion . Take clarithromycin with the medial. Hold the simvastatin while on this antibiotic. Advair one inhalation every 12 hours; gargle and spit after use

## 2011-07-13 HISTORY — PX: KNEE ARTHROCENTESIS: SUR44

## 2011-07-13 HISTORY — PX: OTHER SURGICAL HISTORY: SHX169

## 2011-08-31 ENCOUNTER — Ambulatory Visit (INDEPENDENT_AMBULATORY_CARE_PROVIDER_SITE_OTHER): Payer: PRIVATE HEALTH INSURANCE | Admitting: Internal Medicine

## 2011-08-31 ENCOUNTER — Encounter: Payer: Self-pay | Admitting: Internal Medicine

## 2011-08-31 VITALS — BP 138/76 | HR 72 | Temp 97.9°F | Wt 216.0 lb

## 2011-08-31 DIAGNOSIS — J069 Acute upper respiratory infection, unspecified: Secondary | ICD-10-CM

## 2011-08-31 MED ORDER — FLUTICASONE PROPIONATE 50 MCG/ACT NA SUSP: 2.0000 | Freq: Every day | NASAL | Status: DC

## 2011-08-31 MED ORDER — AZITHROMYCIN 250 MG PO TABS: ORAL_TABLET | ORAL | Status: AC

## 2011-08-31 NOTE — Progress Notes (Signed)
  Subjective:    Patient ID: Raven [name], female    DOB: 1956-05-13, 56 y.o.   MRN: [id number]  HPI  Acute visit Symptoms started 3 days ago with hoarseness, sinus congestion, green nasal discharge and cough. Has not taken any medicine for these symptoms.  Past Medical History  Diagnosis Date  . Hyperlipidemia   . Migraine   . Diabetes mellitus   . GERD (gastroesophageal reflux disease)   . Hypertension      Review of Systems She was seen 2 months ago with similar symptoms, she felt completely well up until 3 days ago. No fever, some chills at night. No actual wheezing. No nausea, vomiting, diarrhea.    Objective:   Physical Exam Alert, oriented x3. No apparent distress. Ears: Tympanic membrane the right normal, on the left is slightly bulge without redness. Nose: Slightly congested. Face symmetric, nontender to palpation. Throat without redness or discharge. Lungs clear to auscultation Cardiovascular regular rhythm no murmur.        Assessment & Plan:  URI: See instructions, strongly recommend not to use antibiotics if she is improving

## 2011-08-31 NOTE — Patient Instructions (Signed)
Rest, fluids , tylenol For cough, take Mucinex DM twice a day as needed  For congestion use flonase as prescribed until better  Take the antibiotic only if not better in 3-4 days (zithromax) Call if no better in few days Call anytime if the symptoms are severe

## 2011-09-01 ENCOUNTER — Ambulatory Visit: Payer: PRIVATE HEALTH INSURANCE | Admitting: Family Medicine

## 2011-10-01 ENCOUNTER — Telehealth: Payer: Self-pay | Admitting: Internal Medicine

## 2011-10-01 MED ORDER — DIAZEPAM 5 MG PO TABS: 5.0000 mg | ORAL_TABLET | Freq: Every day | ORAL | Status: DC | PRN

## 2011-10-01 NOTE — Telephone Encounter (Signed)
RX sent

## 2011-10-01 NOTE — Telephone Encounter (Signed)
Refill: Diazepam 5mg  #30. [facility]

## 2011-10-18 ENCOUNTER — Encounter (HOSPITAL_BASED_OUTPATIENT_CLINIC_OR_DEPARTMENT_OTHER): Payer: Self-pay | Admitting: *Deleted

## 2011-10-18 NOTE — Progress Notes (Signed)
NPO AFTER MN WITH EXCEPTION WATER/ GATORADE UNTIL 0700. ARRIVES AT 1100. NEEDS ISTAT . CURRENT EKG IN [facility] 03-19-11.

## 2011-10-19 NOTE — H&P (Signed)
[name] DOB: Jan 13, 1956  Chief Complaint: right knee pain  History of Present Illness The patient is a 56 year old female who is scheduled for a arthroscopic right knee menisectomy with Dr. [name] on Thursday October 21, 2011.The patient is being followed for their right knee pain.  She first presented in early January with knee pain that started without any known injury. The patient has degenerative arthritic changes in the right knee. She has initial relief with cortisone injection but short lived. MRI showed tricompartmental arthritic changes as well as medial meniscus tear in the right knee along with loose bodies. Most predictable means for increased function and decreased pain in the right knee is medial menisectomy. Risks and benefits of surgery discussed.    Past Medial History Chronic Cystitis Diabetes Mellitus, Type II Gastroesophageal Reflux Disease Migraine Headache Osteoarthritis   Allergies PENICILLIN.  VICODIN.-Dizziness, Nausea, Vomiting.   Family History Cancer. grandfather fathers side Cerebrovascular Accident. grandmother mothers side Chronic Obstructive Lung Disease. father Congestive Heart Failure. father Depression. child Diabetes Mellitus. mother, father, grandmother mothers side and child Heart Disease. mother and father Heart disease in female family member before age 31 Heart disease in female family member before age 22 Hypertension. mother and father Osteoarthritis. mother, father and grandmother mothers side Osteoporosis. mother Rheumatoid Arthritis. grandmother mothers side   Social History Alcohol use. current drinker; drinks wine; only occasionally per week Children. 3 Current work status. working full time Drug/Alcohol Rehab (Currently). no Drug/Alcohol Rehab (Previously). no Exercise. Exercises rarely; does running / walking Illicit drug use. no Living situation. live with spouse Marital  status. married Number of flights of stairs before winded. 2-3 Pain Contract. no Tobacco / smoke exposure. no Tobacco use. never smoker   Medication History Estrace (0.1MG /GM Cream, Vaginal) Active. Januvia (100MG  Tablet, Oral) Active. Lisinopril (20MG  Tablet, Oral) Active. MetFORMIN HCl (1000MG  Tablet, Oral) Active. NexIUM (40MG  Capsule DR, Oral) Active. Simvastatin (40MG  Tablet, Oral) Active. Vagifem ( Tablet, Vaginal) Active. Clarithromycin (500MG  Tablet ER 24HR, Oral) Active.   Past Surgical History Breast Biopsy. left Hysterectomy. partial (non-cancerous) Tubal Ligation    Review of Systems General:Present- Night Sweats. Not Present- Chills, Fever, Appetite Loss, Fatigue, Feeling sick, Weight Gain and Weight Loss. Skin:Not Present- Itching, Rash, Skin Color Changes, Ulcer, Psoriasis and Change in Hair or Nails. HEENT:Not Present- Sensitivity to light, Hearing problems, Nose Bleed and Ringing in the Ears. Neck:Not Present- Swollen Glands and Neck Mass. Respiratory:Not Present- Snoring, Chronic Cough, Bloody sputum and Dyspnea. Cardiovascular:Present- Leg Cramps. Not Present- Shortness of Breath, Chest Pain, Swelling of Extremities and Palpitations. Gastrointestinal:Present- Heartburn and Abdominal Pain. Not Present- Bloody Stool, Vomiting, Nausea and Incontinence of Stool. Female Genitourinary:Present- Incontinence and Nocturia. Not Present- Blood in Urine, Menstrual Irregularities and Frequency. Musculoskeletal:Present- Muscle Weakness, Joint Stiffness, Joint Swelling and Joint Pain. Not Present- Muscle Pain and Back Pain. Neurological:Present- Headaches. Not Present- Tingling, Numbness, Burning, Tremor and Dizziness. Psychiatric:Present- Anxiety. Not Present- Depression and Memory Loss. Endocrine:Present- Excessive Thirst. Not Present- Cold Intolerance, Heat Intolerance and Excessive hunger. Hematology:Not Present- Abnormal Bleeding, Anemia,  Blood Clots and Easy Bruising.   Vitals Weight: 217 lb Height: 68.5 in Body Surface Area: 2.18 m Body Mass Index: 32.51 kg/m Pulse: 77 (Regular) BP: 163/89 (Sitting, Left Arm, Standard)   Physical Exam She has pain and swelling in her knee. Tender to palpation along the medial joint line. Moderate crepitus with motion. No erythema. No signs of gout or infection. The popliteal space is fine. Good circulation in the extremity. No calf  tenderness. No signs of a deep venous thrombosis. The exam of her hip was normal. The left knee was normal.    RADIOGRAPHS: The X-rays of her knee show degenerative arthritic changes in all three compartments.   Assessment and Plans Medial meniscus tear, right knee Arthroscopic medial meniscectomy, clean out and removal of loose bodies in right knee    [place], PA-C

## 2011-10-21 ENCOUNTER — Other Ambulatory Visit: Payer: Self-pay

## 2011-10-21 ENCOUNTER — Encounter (HOSPITAL_BASED_OUTPATIENT_CLINIC_OR_DEPARTMENT_OTHER): Payer: Self-pay | Admitting: Anesthesiology

## 2011-10-21 ENCOUNTER — Encounter (HOSPITAL_BASED_OUTPATIENT_CLINIC_OR_DEPARTMENT_OTHER): Payer: Self-pay | Admitting: *Deleted

## 2011-10-21 ENCOUNTER — Ambulatory Visit (HOSPITAL_BASED_OUTPATIENT_CLINIC_OR_DEPARTMENT_OTHER): Payer: PRIVATE HEALTH INSURANCE | Admitting: Anesthesiology

## 2011-10-21 ENCOUNTER — Ambulatory Visit (HOSPITAL_BASED_OUTPATIENT_CLINIC_OR_DEPARTMENT_OTHER)
Admission: RE | Admit: 2011-10-21 | Discharge: 2011-10-21 | Disposition: A | Discharge status: Home / Self Care | Payer: PRIVATE HEALTH INSURANCE | Source: Ambulatory Visit | Attending: Orthopedic Surgery | Admitting: Orthopedic Surgery

## 2011-10-21 ENCOUNTER — Encounter (HOSPITAL_BASED_OUTPATIENT_CLINIC_OR_DEPARTMENT_OTHER)
Admission: RE | Disposition: A | Discharge status: Home / Self Care | Payer: Self-pay | Source: Ambulatory Visit | Attending: Orthopedic Surgery

## 2011-10-21 DIAGNOSIS — K219 Gastro-esophageal reflux disease without esophagitis: Secondary | ICD-10-CM | POA: Insufficient documentation

## 2011-10-21 DIAGNOSIS — M23305 Other meniscus derangements, unspecified medial meniscus, unspecified knee: Secondary | ICD-10-CM | POA: Insufficient documentation

## 2011-10-21 DIAGNOSIS — M1711 Unilateral primary osteoarthritis, right knee: Secondary | ICD-10-CM | POA: Diagnosis present

## 2011-10-21 DIAGNOSIS — Z79899 Other long term (current) drug therapy: Secondary | ICD-10-CM | POA: Insufficient documentation

## 2011-10-21 DIAGNOSIS — M659 Unspecified synovitis and tenosynovitis, unspecified site: Secondary | ICD-10-CM | POA: Insufficient documentation

## 2011-10-21 DIAGNOSIS — E119 Type 2 diabetes mellitus without complications: Secondary | ICD-10-CM | POA: Insufficient documentation

## 2011-10-21 DIAGNOSIS — M1710 Unilateral primary osteoarthritis, unspecified knee: Secondary | ICD-10-CM | POA: Insufficient documentation

## 2011-10-21 DIAGNOSIS — M2240 Chondromalacia patellae, unspecified knee: Secondary | ICD-10-CM | POA: Insufficient documentation

## 2011-10-21 HISTORY — PX: KNEE ARTHROSCOPY: SHX127

## 2011-10-21 HISTORY — DX: Other tear of medial meniscus, current injury, right knee, initial encounter: S83.241A

## 2011-10-21 LAB — POCT I-STAT 4, (NA,K, GLUC, HGB,HCT)
HCT: 35 % — ABNORMAL LOW (ref 36.0–46.0)
Hemoglobin: 11.9 g/dL — ABNORMAL LOW (ref 12.0–15.0)
Sodium: 142 mEq/L (ref 135–145)

## 2011-10-21 SURGERY — ARTHROSCOPY, KNEE
Anesthesia: General | Site: Knee | Laterality: Right | Wound class: Clean

## 2011-10-21 MED ORDER — CLINDAMYCIN PHOSPHATE 600 MG/50ML IV SOLN
600.0000 mg | INTRAVENOUS | Status: AC
  Administered 2011-10-21: 600 mg via INTRAVENOUS

## 2011-10-21 MED ORDER — HYDROMORPHONE HCL 2 MG PO TABS: 2.0000 mg | ORAL_TABLET | ORAL | Status: AC | PRN

## 2011-10-21 MED ORDER — FENTANYL CITRATE 0.05 MG/ML IJ SOLN
25.0000 ug | INTRAMUSCULAR | Status: DC | PRN
  Administered 2011-10-21 (×2): 25 ug via INTRAVENOUS

## 2011-10-21 MED ORDER — LACTATED RINGERS IV SOLN: INTRAVENOUS | Status: DC

## 2011-10-21 MED ORDER — LACTATED RINGERS IV SOLN
INTRAVENOUS | Status: DC | PRN
  Administered 2011-10-21 (×2): via INTRAVENOUS

## 2011-10-21 MED ORDER — HYDROMORPHONE HCL 2 MG PO TABS
2.0000 mg | ORAL_TABLET | Freq: Four times a day (QID) | ORAL | Status: AC | PRN
  Administered 2011-10-21: 2 mg via ORAL

## 2011-10-21 MED ORDER — LIDOCAINE HCL (CARDIAC) 20 MG/ML IV SOLN
INTRAVENOUS | Status: DC | PRN
  Administered 2011-10-21: 50 mg via INTRAVENOUS

## 2011-10-21 MED ORDER — CHLORHEXIDINE GLUCONATE 4 % EX LIQD: 60.0000 mL | Freq: Once | CUTANEOUS | Status: DC

## 2011-10-21 MED ORDER — BUPIVACAINE-EPINEPHRINE 0.25% -1:200000 IJ SOLN
INTRAMUSCULAR | Status: DC | PRN
  Administered 2011-10-21: 30 mL

## 2011-10-21 MED ORDER — PROPOFOL 10 MG/ML IV EMUL
INTRAVENOUS | Status: DC | PRN
  Administered 2011-10-21: 200 mg via INTRAVENOUS

## 2011-10-21 MED ORDER — MIDAZOLAM HCL 5 MG/5ML IJ SOLN
INTRAMUSCULAR | Status: DC | PRN
  Administered 2011-10-21: 2 mg via INTRAVENOUS

## 2011-10-21 MED ORDER — PROMETHAZINE HCL 25 MG/ML IJ SOLN: 6.2500 mg | INTRAMUSCULAR | Status: DC | PRN

## 2011-10-21 MED ORDER — KETOROLAC TROMETHAMINE 30 MG/ML IJ SOLN: 15.0000 mg | Freq: Once | INTRAMUSCULAR | Status: DC | PRN

## 2011-10-21 MED ORDER — FENTANYL CITRATE 0.05 MG/ML IJ SOLN
INTRAMUSCULAR | Status: DC | PRN
  Administered 2011-10-21: 25 ug via INTRAVENOUS
  Administered 2011-10-21 (×3): 50 ug via INTRAVENOUS

## 2011-10-21 MED ORDER — SODIUM CHLORIDE 0.9 % IR SOLN
Status: DC | PRN
  Administered 2011-10-21: 6000 mL

## 2011-10-21 MED ORDER — DOXYCYCLINE HYCLATE 50 MG PO CAPS: 100.0000 mg | ORAL_CAPSULE | Freq: Two times a day (BID) | ORAL | Status: AC

## 2011-10-21 MED ORDER — BACITRACIN-NEOMYCIN-POLYMYXIN 400-5-5000 EX OINT
TOPICAL_OINTMENT | CUTANEOUS | Status: DC | PRN
  Administered 2011-10-21: 1 via TOPICAL

## 2011-10-21 MED ORDER — LACTATED RINGERS IV SOLN
INTRAVENOUS | Status: DC
  Administered 2011-10-21: 12:00:00 via INTRAVENOUS

## 2011-10-21 SURGICAL SUPPLY — 43 items
BANDAGE ELASTIC 4 VELCRO ST LF (GAUZE/BANDAGES/DRESSINGS) ×2 IMPLANT
BANDAGE ELASTIC 6 VELCRO ST LF (GAUZE/BANDAGES/DRESSINGS) ×2 IMPLANT
BLADE CUDA 5.5 (BLADE) IMPLANT
BLADE CUTTER GATOR 3.5 (BLADE) IMPLANT
BLADE CUTTER MENIS 5.5 (BLADE) IMPLANT
BLADE GREAT WHITE 4.2 (BLADE) ×2 IMPLANT
BLADE SURG 15 STRL LF DISP TIS (BLADE) ×1 IMPLANT
BLADE SURG 15 STRL SS (BLADE) ×1
BNDG COHESIVE 6X5 TAN NS LF (GAUZE/BANDAGES/DRESSINGS) ×2 IMPLANT
CANISTER SUCT LVC 12 LTR MEDI- (MISCELLANEOUS) ×2 IMPLANT
CANISTER SUCTION 2500CC (MISCELLANEOUS) ×2 IMPLANT
CLOTH BEACON ORANGE TIMEOUT ST (SAFETY) ×2 IMPLANT
DRAPE ARTHROSCOPY W/POUCH 114 (DRAPES) ×2 IMPLANT
DRAPE LG THREE QUARTER DISP (DRAPES) ×2 IMPLANT
DRSG EMULSION OIL 3X3 NADH (GAUZE/BANDAGES/DRESSINGS) ×2 IMPLANT
DRSG PAD ABDOMINAL 8X10 ST (GAUZE/BANDAGES/DRESSINGS) ×2 IMPLANT
DURAPREP 26ML APPLICATOR (WOUND CARE) ×2 IMPLANT
ELECT MENISCUS 165MM 90D (ELECTRODE) IMPLANT
ELECT REM PT RETURN 9FT ADLT (ELECTROSURGICAL)
ELECTRODE REM PT RTRN 9FT ADLT (ELECTROSURGICAL) IMPLANT
GLOVE ECLIPSE 6.5 STRL STRAW (GLOVE) ×2 IMPLANT
GLOVE ECLIPSE 8.0 STRL XLNG CF (GLOVE) ×2 IMPLANT
GLOVE INDICATOR 6.5 STRL GRN (GLOVE) ×2 IMPLANT
GLOVE INDICATOR 8.5 STRL (GLOVE) ×4 IMPLANT
GLOVE SURG ORTHO 6.0 STRL STRW (GLOVE) ×2 IMPLANT
GOWN PREVENTION PLUS LG XLONG (DISPOSABLE) ×2 IMPLANT
GOWN STRL REIN XL XLG (GOWN DISPOSABLE) ×2 IMPLANT
IV NS IRRIG 3000ML ARTHROMATIC (IV SOLUTION) ×4 IMPLANT
KNEE WRAP E Z 3 GEL PACK (MISCELLANEOUS) ×2 IMPLANT
MINI VAC (SURGICAL WAND) IMPLANT
PACK ARTHROSCOPY DSU (CUSTOM PROCEDURE TRAY) ×2 IMPLANT
PACK BASIN DAY SURGERY FS (CUSTOM PROCEDURE TRAY) ×2 IMPLANT
PADDING CAST COTTON 6X4 STRL (CAST SUPPLIES) ×2 IMPLANT
PENCIL BUTTON HOLSTER BLD 10FT (ELECTRODE) IMPLANT
SET ARTHROSCOPY TUBING (MISCELLANEOUS) ×1
SET ARTHROSCOPY TUBING LN (MISCELLANEOUS) ×1 IMPLANT
SPONGE GAUZE 4X4 12PLY (GAUZE/BANDAGES/DRESSINGS) ×2 IMPLANT
SPONGE SURGIFOAM ABS GEL 12-7 (HEMOSTASIS) IMPLANT
SUT ETHILON 3 0 PS 1 (SUTURE) ×2 IMPLANT
SYR CONTROL 10ML LL (SYRINGE) IMPLANT
TOWEL OR 17X24 6PK STRL BLUE (TOWEL DISPOSABLE) ×2 IMPLANT
WAND 90 DEG TURBOVAC W/CORD (SURGICAL WAND) ×2 IMPLANT
WATER STERILE IRR 500ML POUR (IV SOLUTION) ×2 IMPLANT

## 2011-10-21 NOTE — Discharge Instructions (Signed)
Schedule appointment to see me in 10-12 days post op. Call the office at [phone] to make appointment.  Ice to the knee x 24 hours Aspirin 325mg twice a day started the day of surgery and for 2 weeks Crutches full weight bearing until you have control of your leg without them Remove all dressings in 48 hours and apply band-aids to each incision. Then you may shower. Any temperatures over 100 or severe calf pain, call the office 

## 2011-10-21 NOTE — Anesthesia Preprocedure Evaluation (Signed)
Anesthesia Evaluation  Patient identified by MRN, date of birth, ID band Patient awake    Reviewed: Allergy & Precautions, H&P , NPO status , Patient's Chart, lab work & pertinent test results  Airway Mallampati: II TM Distance: >3 FB Neck ROM: Full    Dental No notable dental hx.    Pulmonary neg pulmonary ROS,  breath sounds clear to auscultation  Pulmonary exam normal       Cardiovascular hypertension, Pt. on medications Rhythm:Regular Rate:Normal     Neuro/Psych Anxiety negative neurological ROS     GI/Hepatic Neg liver ROS, GERD-  Medicated,  Endo/Other  Diabetes mellitus-, Oral Hypoglycemic Agents  Renal/GU negative Renal ROS  negative genitourinary   Musculoskeletal negative musculoskeletal ROS (+)   Abdominal   Peds negative pediatric ROS (+)  Hematology negative hematology ROS (+)   Anesthesia Other Findings   Reproductive/Obstetrics negative OB ROS                           Anesthesia Physical Anesthesia Plan  ASA: II  Anesthesia Plan: General   Post-op Pain Management:    Induction: Intravenous  Airway Management Planned: LMA  Additional Equipment:   Intra-op Plan:   Post-operative Plan:   Informed Consent: I have reviewed the patients History and Physical, chart, labs and discussed the procedure including the risks, benefits and alternatives for the proposed anesthesia with the patient or authorized representative who has indicated his/her understanding and acceptance.   Dental advisory given  Plan Discussed with: CRNA  Anesthesia Plan Comments:         Anesthesia Quick Evaluation

## 2011-10-21 NOTE — Brief Op Note (Signed)
10/21/2011  2:26 PM  PATIENT:  Raven [name]  56 y.o. female  PRE-OPERATIVE DIAGNOSIS:  RIGHT KNEE MEDIAL MENISCAL TEAR  POST-OPERATIVE DIAGNOSIS:  RIGHT KNEE MEDIAL MENISCAL TEAR  PROCEDURE:  Procedure(s) (LRB): ARTHROSCOPY KNEE (Right)  SURGEON:  Surgeon(s) and Role:    * Jacki Cones, MD - Primary     ASSISTANTS: nurse  ANESTHESIA:   general  EBL:  Total I/O In: 1200 [I.V.:1200] Out: -   BLOOD ADMINISTERED:none  DRAINS: none   LOCAL MEDICATIONS USED:  MARCAINE  30cc of 0.25%  SPECIMEN:  No Specimen  DISPOSITION OF SPECIMEN:  N/A  COUNTS:  YES  TOURNIQUET:  * No tourniquets in log *  DICTATION: .Other Dictation: Dictation Number [id number]  PLAN OF CARE: Discharge to home after PACU  PATIENT DISPOSITION:  PACU - hemodynamically stable.   Delay start of Pharmacological VTE agent (>24hrs) due to surgical blood loss or risk of bleeding: yes

## 2011-10-21 NOTE — Transfer of Care (Signed)
Immediate Anesthesia Transfer of Care Note  Patient: Raven [name]  Procedure(s) Performed: Procedure(s) (LRB): ARTHROSCOPY KNEE (Right)  Patient Location: PACU  Anesthesia Type: General  Level of Consciousness: awake, oriented, sedated and patient cooperative  Airway & Oxygen Therapy: Patient Spontanous Breathing and Patient connected to face mask oxygen  Post-op Assessment: Report given to PACU RN and Post -op Vital signs reviewed and stable  Post vital signs: Reviewed and stable  Complications: No apparent anesthesia complications

## 2011-10-21 NOTE — Interval H&P Note (Signed)
History and Physical Interval Note:  10/21/2011 1:20 PM  Raven [name]  has presented today for surgery, with the diagnosis of RIGHT KNEE MEDIAL MENISCAL TEAR  The various methods of treatment have been discussed with the patient and family. After consideration of risks, benefits and other options for treatment, the patient has consented to  Procedure(s) (LRB): ARTHROSCOPY KNEE (Right) as a surgical intervention .  The patients' history has been reviewed, patient examined, no change in status, stable for surgery.  I have reviewed the patients' chart and labs.  Questions were answered to the patient's satisfaction.     Shalon Councilman A

## 2011-10-21 NOTE — Anesthesia Procedure Notes (Addendum)
Procedure Name: LMA Insertion Date/Time: 10/21/2011 1:35 PM Performed by: Renella Cunas D Pre-anesthesia Checklist: Patient identified, Emergency Drugs available, Suction available and Patient being monitored Patient Re-evaluated:Patient Re-evaluated prior to inductionOxygen Delivery Method: Circle System Utilized Preoxygenation: Pre-oxygenation with 100% oxygen Intubation Type: IV induction Ventilation: Mask ventilation without difficulty LMA: LMA inserted LMA Size: 4.0 Number of attempts: 1 Airway Equipment and Method: bite block Placement Confirmation: positive ETCO2 Tube secured with: Tape Dental Injury: Teeth and Oropharynx as per pre-operative assessment

## 2011-10-22 ENCOUNTER — Encounter (HOSPITAL_BASED_OUTPATIENT_CLINIC_OR_DEPARTMENT_OTHER): Payer: Self-pay | Admitting: Orthopedic Surgery

## 2011-10-22 NOTE — Anesthesia Postprocedure Evaluation (Signed)
  Anesthesia Post-op Note  Patient: Raven [name]  Procedure(s) Performed: Procedure(s) (LRB): ARTHROSCOPY KNEE (Right)  Patient Location: PACU  Anesthesia Type: General  Level of Consciousness: oriented and sedated  Airway and Oxygen Therapy: Patient Spontanous Breathing  Post-op Pain: mild  Post-op Assessment: Post-op Vital signs reviewed, Patient's Cardiovascular Status Stable, Respiratory Function Stable and Patent Airway  Post-op Vital Signs: stable  Complications: No apparent anesthesia complications

## 2011-10-22 NOTE — Op Note (Signed)
Raven [name], Raven [name]             ACCOUNT NO.:  [id number]  MEDICAL RECORD NO.:  [id number]  LOCATION:                               FACILITY:  [facility]  PHYSICIAN:  Georges Lynch. Cavon Nicolls, M.D.DATE OF BIRTH:  1956-03-30  DATE OF PROCEDURE:  10/21/2011 DATE OF DISCHARGE:                              OPERATIVE REPORT   SURGEON:  Georges Lynch. [name], M.D.  ASSISTANT:  Nurse.  PREOPERATIVE DIAGNOSES: 1. Severe degenerative arthritis of the right knee. 2. Small tear of the medial meniscus, right knee.  POSTOPERATIVE DIAGNOSES: 1. Severe degenerative arthritis of the right knee. 2. Small tear of the medial meniscus, right knee.  OPERATION: 1. Diagnostic arthroscopy, right knee. 2. Synovectomy, suprapatellar pouch, right knee. 3. Abrasion chondroplasty, medial femoral condyle, right knee. 4. Abrasion chondroplasty, lateral femoral condyle, right knee. 5. Microfracture technique, medial femoral condyle, right knee. 6. Microfracture technique, lateral femoral condyle, right knee.  DESCRIPTION OF PROCEDURE:  Under general anesthesia with the patient's right leg in the knee holder, she had 600 mg of clindamycin IV.  The appropriate time-out was carried out as usual fashion before any incisions were made.  I also marked the appropriate right knee in the holding area.  At this time, after sterile prep and draping, I made a small incision at the suprapatellar pouch.  Inflow cannula was distended, inserted.  Knee was distended with saline.  Another small punctate incision was made in the anterolateral joint.  The arthroscope was entered from lateral approach and a complete diagnostic arthroscopy was carried out.  I went up in the suprapatellar pouch.  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  I did a synovectomy.  I then went down the lateral joint.  She had a large defect in the highlight done at distal femur.  There were large pieces of  cartilage literally hanging from the bone.  I shaved those down with the shaver suction device and did a microfracture technique.  The lateral meniscus was intact.  Cruciates were intact.  Down over the medial joint, I examined the medial meniscus; really there was no surgery necessary on the medial meniscus, it was stable.  She had severe arthritic changes though in the posterior, medial, and mid portion of the tibial plateau.  She had severe changes in the distal femur.  I introduced a shaver suction device and did an abrasion chondroplasty in the medial femoral condyle followed by microfracture technique.  So basically, I did microfracture techniques on both sides of the knee joint and femoral condyles.  Note, we were trying to save her from a total knee arthroplasty, which she may need in the future.  She is a young person, she is 56 years old.  I thoroughly irrigated out the knee, injected 30 cc of 0.25% Marcaine in the knee joint.  All 3 punctate incisions were closed with 3-0 nylon suture.  I then applied a sterile bundle dressing.  Note, the patient will be placed on doxycycline since she is allergic to Ancef/penicillin. I will put her on doxycycline 100 mg b.i.d. for about 5 days because of the microfracture technique.  She will also be on Dilaudid  2 mg every 4 hours p.r.n. for pain since she cannot tolerate codeine.  She will also be on aspirin 300 mg b.i.d. starting today and b.i.d. for 2 weeks and anticoagulant.  I will see her in the office 10-12 days or prior if she has any problem.          ______________________________ Georges Lynch [name], M.D.     RAG/MEDQ  D:  10/21/2011  T:  10/22/2011  Job:  [id number]  cc:   Titus Dubin. Alwyn Ren, MD,FACP,FCCP [phone] [address] [place], [place] [address]

## 2011-10-26 ENCOUNTER — Encounter (HOSPITAL_BASED_OUTPATIENT_CLINIC_OR_DEPARTMENT_OTHER): Payer: Self-pay

## 2011-12-20 ENCOUNTER — Encounter: Payer: Self-pay | Admitting: Internal Medicine

## 2011-12-20 ENCOUNTER — Ambulatory Visit (INDEPENDENT_AMBULATORY_CARE_PROVIDER_SITE_OTHER): Payer: PRIVATE HEALTH INSURANCE | Admitting: Internal Medicine

## 2011-12-20 VITALS — BP 120/76 | HR 73 | Temp 98.4°F | Wt 200.0 lb

## 2011-12-20 DIAGNOSIS — F411 Generalized anxiety disorder: Secondary | ICD-10-CM

## 2011-12-20 DIAGNOSIS — I10 Essential (primary) hypertension: Secondary | ICD-10-CM

## 2011-12-20 DIAGNOSIS — E785 Hyperlipidemia, unspecified: Secondary | ICD-10-CM

## 2011-12-20 DIAGNOSIS — E119 Type 2 diabetes mellitus without complications: Secondary | ICD-10-CM

## 2011-12-20 MED ORDER — LISINOPRIL 20 MG PO TABS: 20.0000 mg | ORAL_TABLET | Freq: Every day | ORAL | Status: DC

## 2011-12-20 MED ORDER — METFORMIN HCL 1000 MG PO TABS: ORAL_TABLET | ORAL | Status: DC

## 2011-12-20 MED ORDER — SERTRALINE HCL 50 MG PO TABS: 50.0000 mg | ORAL_TABLET | Freq: Every day | ORAL | Status: DC

## 2011-12-20 NOTE — Progress Notes (Signed)
Subjective:    Patient ID: Raven [name], female    DOB: 1956/01/04, 56 y.o.   MRN: [id number]  HPI Since mid-January she has had repeated orthopedic procedures with a total of 4 intra-articular steroid injections. Last injection last week. Subsequent to that she's had elevation in her glucoses. Fasting blood sugars range from 95-140; 2 hr post meal < 160.  Exercise : no due to knees . Nutrition/diet:  Portion control. Medication compliance : yes. Medication adverse  Effects:  no . Eye exam : 2011. Foot care : no.  HYPERTENSION: Disease Monitoring: Blood pressure range-120-2/76-8  Medications: Compliance- yes   HYPERLIPIDEMIA: Disease Monitoring: See symptoms for Hypertension Medications: Compliance- yes       Review of Systems Hypoglycemia :  no .                                                     Excess thirst :yes;  Excess hunger:  no ;  Excess urination:  no.                                  Lightheadedness with standing:  some. Chest pain:  no ; Palpitations :no ;  Pain in  calves with walking:  no .                                                                                                                                 Non healing skin  ulcers or sores,especially over the feet:  no. Numbness or tingling or burning in feet : no .                                                                                                                                              Significant change in  Weight : down 10 #. Vision changes : no  .        Edema- no Abd pain, bowel changes- no   Muscle aches-cramps in calves  Because she was running out she had cut her Zoloft from 50 mg daily to 25 mg daily with  no apparent ill effects. She feels it is a valuable tool                                                                            Objective:   Physical Exam Gen.: Healthy  & well-nourished, appropriate and alert Eyes: No lid/conjunctival changes,  extraocular motion intact, fundi benign Neck: Normal  thyroid Respiratory: No increased work of breathing or abnormal breath sounds Cardiac : regular rhythm, no extra heart sounds, gallop.S4 with slurring  Abdomen: No organomegaly ,masses, bruits or aortic enlargement Skin: No rashes, lesions, ulcers or ischemic changes  Vasc:All pulses intact, no bruits present Neuro:  alert & oriented, sensation over feet normal Psych: judgment and insight, mood and affect normal         Assessment & Plan:

## 2011-12-20 NOTE — Assessment & Plan Note (Signed)
Blood pressure is well controlled; no change will be made in  medications

## 2011-12-20 NOTE — Assessment & Plan Note (Signed)
Fasting lipids will be scheduled to assess the efficacy of the statin

## 2011-12-20 NOTE — Assessment & Plan Note (Signed)
Her home readings are not dramatically elevated but A1c will need to be performed to assess the impact of the steroids on her diabetes.

## 2011-12-20 NOTE — Patient Instructions (Signed)
Please  schedule fasting Labs : BMET,Lipids, hepatic panel,  TSH, A1c & urine microalbumin. PLEASE BRING THESE INSTRUCTIONS TO FOLLOW UP  LAB APPOINTMENT.This will guarantee correct labs are drawn, eliminating need for repeat blood sampling ( needle sticks ! ). Diagnoses /Codes: 272.4,995.20, [phone]. Please try to go on My Chart within the  24 hours after blood draw  to allow me to release the results directly to you.

## 2011-12-22 ENCOUNTER — Other Ambulatory Visit (INDEPENDENT_AMBULATORY_CARE_PROVIDER_SITE_OTHER): Payer: PRIVATE HEALTH INSURANCE

## 2011-12-22 DIAGNOSIS — T887XXA Unspecified adverse effect of drug or medicament, initial encounter: Secondary | ICD-10-CM

## 2011-12-22 DIAGNOSIS — I10 Essential (primary) hypertension: Secondary | ICD-10-CM

## 2011-12-22 DIAGNOSIS — E119 Type 2 diabetes mellitus without complications: Secondary | ICD-10-CM

## 2011-12-22 DIAGNOSIS — E785 Hyperlipidemia, unspecified: Secondary | ICD-10-CM

## 2011-12-22 LAB — LIPID PANEL
Cholesterol: 147 mg/dL (ref 0–200)
HDL: 41.1 mg/dL (ref >39.00)
Total CHOL/HDL Ratio: 4
Triglycerides: 92 mg/dL (ref 0.0–149.0)

## 2011-12-22 LAB — BASIC METABOLIC PANEL
CO2: 22 mEq/L (ref 19–32)
Calcium: 8.6 mg/dL (ref 8.4–10.5)
Chloride: 103 mEq/L (ref 96–112)
Creatinine, Ser: 0.7 mg/dL (ref 0.4–1.2)
Sodium: 136 mEq/L (ref 135–145)

## 2011-12-22 LAB — MICROALBUMIN / CREATININE URINE RATIO
Creatinine,U: 49.1 mg/dL
Microalb Creat Ratio: 1 mg/g (ref 0.0–30.0)
Microalb, Ur: 0.5 mg/dL (ref 0.0–1.9)

## 2011-12-27 ENCOUNTER — Encounter: Payer: Self-pay | Admitting: *Deleted

## 2012-01-07 ENCOUNTER — Other Ambulatory Visit: Payer: Self-pay | Admitting: Internal Medicine

## 2012-01-07 DIAGNOSIS — E119 Type 2 diabetes mellitus without complications: Secondary | ICD-10-CM

## 2012-01-07 MED ORDER — SITAGLIPTIN PHOSPHATE 100 MG PO TABS: 50.0000 mg | ORAL_TABLET | Freq: Every day | ORAL | Status: DC

## 2012-01-07 NOTE — Telephone Encounter (Signed)
refill januvia 100mg  tab #90, take 1/2 tablet by mouth every day, last fill 3.22.13, last ov 6.10.13

## 2012-02-22 ENCOUNTER — Ambulatory Visit (INDEPENDENT_AMBULATORY_CARE_PROVIDER_SITE_OTHER): Payer: PRIVATE HEALTH INSURANCE | Admitting: Internal Medicine

## 2012-02-22 ENCOUNTER — Encounter: Payer: Self-pay | Admitting: Internal Medicine

## 2012-02-22 VITALS — BP 114/68 | HR 71 | Temp 98.1°F | Wt 204.0 lb

## 2012-02-22 DIAGNOSIS — M79601 Pain in right arm: Secondary | ICD-10-CM

## 2012-02-22 DIAGNOSIS — M79609 Pain in unspecified limb: Secondary | ICD-10-CM

## 2012-02-22 NOTE — Progress Notes (Signed)
  Subjective:    Patient ID: Raven [name], female    DOB: 08-Feb-1956, 56 y.o.   MRN: [id number]  HPI For 2 weeks she has had  a sensation of fluid accumulation at the right elbow with decreased extension of the right upper extremity. Indcocin taken for knee symptoms has not been of benefit. She describes the sensation as that when she did have a patellar effusion. She denies any trigger or injury for the symptoms.  She has been diagnosed with osteoarthritis as has her mother. Her maternal grandmother had rheumatoid arthritis. This year she's had 3 interarticular steroids in the right knee and one on the left by her orthopedist. She has had patellar effusions removed this year; 3 X on the right and once on the left. Total knee replacement has been discussed    Review of Systems She has had no associated fever, chills, sweats, weight change, rash, or adenopathy.     Objective:   Physical Exam She appears well-nourished and in no acute distress  She has no lymphadenopathy about the neck, axilla, or epitrochlear areas  There is good range of motion of the neck. There is slight lordosis of the upper thoracic spine. No malalignment is noted on observation the spine  There is decreased range of motion in the right upper extremity with the arm flexed and rotated posteriorly and elevated. When she does contract the biceps she has discomfort along the ulnar proximal forearm area. This area is tender to light percussion. I cannot appreciate asymmetry in the ulnar structures bilaterally. There is no difference in the color of the skin in these areas; but the area on the right is slightly warmer than the left without evidence of cellulitis  Rotating the ulna and radius laterally with arm flexed causes pain in the lateral elbow area. Testing for tenosynovitis with pronation against resistance causes pain in the biceps. The biceps muscle and tendon are not tender. I cannot appreciate an effusion at the  right elbow  She has fusiform changes the knees without definite effusion. There is marked crepitus and decreased range of motion in the right knee greater than the left.        Assessment & Plan:  #1 decreased extension of the right upper extremity; this may represent a tendon/ cartilage issue @ the ulnar forearm area; definite ulnar tenosynovitis is not present.  #2 advanced, end-stage degenerative joint changes the knees. She has had multiple steroid injections without sustained benefit. This therapy will more likely than not exacerbate uncontrolled diabetes.  Plan: I recommend reassessment by an orthopedic hand specialist or the right upper extremity symptoms. Total knee replacement would be an elective process.

## 2012-02-22 NOTE — Patient Instructions (Addendum)
Review and correct the record as indicated. Please share record with all medical staff seen.  

## 2012-03-09 ENCOUNTER — Telehealth: Payer: Self-pay | Admitting: Internal Medicine

## 2012-03-09 NOTE — Telephone Encounter (Signed)
Message copied by Marshell Garfinkel on Thu Mar 09, 2012  4:39 PM ------      Message from: Pecola Lawless      Created: Wed Mar 08, 2012  6:44 PM       Please  schedule  Labs : A1c , urine microalbumin(250.02) as a prelude to Surgical clearance

## 2012-03-09 NOTE — Telephone Encounter (Signed)
Lmovm for pt to call office. °

## 2012-03-10 NOTE — Telephone Encounter (Signed)
done

## 2012-03-14 ENCOUNTER — Other Ambulatory Visit (INDEPENDENT_AMBULATORY_CARE_PROVIDER_SITE_OTHER): Payer: PRIVATE HEALTH INSURANCE

## 2012-03-14 DIAGNOSIS — IMO0001 Reserved for inherently not codable concepts without codable children: Secondary | ICD-10-CM

## 2012-03-14 LAB — HEMOGLOBIN A1C: Hgb A1c MFr Bld: 7 % — ABNORMAL HIGH (ref 4.6–6.5)

## 2012-03-14 LAB — MICROALBUMIN / CREATININE URINE RATIO
Creatinine,U: 77.5 mg/dL
Microalb, Ur: 0.3 mg/dL (ref 0.0–1.9)

## 2012-03-14 NOTE — Progress Notes (Signed)
Labs only

## 2012-03-30 ENCOUNTER — Encounter (HOSPITAL_COMMUNITY): Payer: Self-pay | Admitting: Pharmacy Technician

## 2012-04-06 NOTE — Patient Instructions (Addendum)
20 Raven [name]  04/06/2012   Your procedure is scheduled on:  04/12/12  Surgery 1000-1230   [facility]  Report to [facility] at 0730      AM.  Call this number if you have problems the morning of surgery: [phone]       Remember: NO BLOOD SUGAR MEDICINE MORNING OF SURGERY  Do not eat food  Or drink :After Midnight. Tuesday NIGHT                                     DO HAVE SNACK BEFORE BEDTIME OR MIDNIGHT Tuesday NIGHT   Take these medicines the morning of surgery with A SIP OF WATER: ZOLOFT                      MAY take Ultracet, Valium, Robaxin if needed   .  Contacts, dentures or partial plates can not be worn to surgery  Leave suitcase in the car. After surgery it may be brought to your room.  For patients admitted to the hospital, checkout time is 11:00 AM day of  discharge.             SPECIAL INSTRUCTIONS- SEE [facility] PREPARING FOR SURGERY INSTRUCTION SHEET-     DO NOT WEAR JEWELRY, LOTIONS, POWDERS, OR PERFUMES.  WOMEN-- DO NOT SHAVE LEGS OR UNDERARMS FOR 12 HOURS BEFORE SHOWERS. MEN MAY SHAVE FACE.  Patients discharged the day of surgery will not be allowed to drive home. IF going home the day of surgery, you must have a driver and someone to stay with you for the first 24 hours  Name and phone number of your driver:    ROBERT                                                                    Please read over the following fact sheets that you were given: MRSA Information, Incentive Spirometry Sheet, Blood Transfusion Sheet  Information                                                                                   [name]  PST [phone]

## 2012-04-07 ENCOUNTER — Ambulatory Visit (HOSPITAL_COMMUNITY)
Admission: RE | Admit: 2012-04-07 | Discharge: 2012-04-07 | Disposition: A | Discharge status: Home / Self Care | Payer: PRIVATE HEALTH INSURANCE | Source: Ambulatory Visit | Attending: Surgical | Admitting: Surgical

## 2012-04-07 ENCOUNTER — Encounter (HOSPITAL_COMMUNITY)
Admission: RE | Admit: 2012-04-07 | Discharge: 2012-04-07 | Disposition: A | Discharge status: Home / Self Care | Payer: PRIVATE HEALTH INSURANCE | Source: Ambulatory Visit | Attending: Orthopedic Surgery | Admitting: Orthopedic Surgery

## 2012-04-07 ENCOUNTER — Encounter (HOSPITAL_COMMUNITY): Payer: Self-pay

## 2012-04-07 DIAGNOSIS — Z01818 Encounter for other preprocedural examination: Secondary | ICD-10-CM | POA: Insufficient documentation

## 2012-04-07 DIAGNOSIS — Z01812 Encounter for preprocedural laboratory examination: Secondary | ICD-10-CM | POA: Insufficient documentation

## 2012-04-07 HISTORY — DX: Leukoplakia of vulva: N90.4

## 2012-04-07 HISTORY — DX: Personal history of other diseases of the digestive system: Z87.19

## 2012-04-07 HISTORY — DX: Interstitial cystitis (chronic) without hematuria: N30.10

## 2012-04-07 HISTORY — DX: Other seasonal allergic rhinitis: J30.2

## 2012-04-07 LAB — URINALYSIS, ROUTINE W REFLEX MICROSCOPIC
Bilirubin Urine: NEGATIVE
Glucose, UA: NEGATIVE mg/dL
Ketones, ur: NEGATIVE mg/dL
Nitrite: NEGATIVE
Protein, ur: NEGATIVE mg/dL
Specific Gravity, Urine: 1.016 (ref 1.005–1.030)
Urobilinogen, UA: 0.2 mg/dL (ref 0.0–1.0)
pH: 5.5 (ref 5.0–8.0)

## 2012-04-07 LAB — CBC
MCV: 76 fL — ABNORMAL LOW (ref 78.0–100.0)
Platelets: 357 10*3/uL (ref 150–400)
RBC: 4.09 MIL/uL (ref 3.87–5.11)
RDW: 13.2 % (ref 11.5–15.5)
WBC: 9.3 10*3/uL (ref 4.0–10.5)

## 2012-04-07 LAB — COMPREHENSIVE METABOLIC PANEL
ALT: 22 U/L (ref 0–35)
AST: 17 U/L (ref 0–37)
Albumin: 3.6 g/dL (ref 3.5–5.2)
Alkaline Phosphatase: 53 U/L (ref 39–117)
BUN: 22 mg/dL (ref 6–23)
CO2: 24 mEq/L (ref 19–32)
Calcium: 9 mg/dL (ref 8.4–10.5)
Chloride: 103 mEq/L (ref 96–112)
Creatinine, Ser: 0.79 mg/dL (ref 0.50–1.10)
GFR calc Af Amer: >90 mL/min (ref >90)
GFR calc non Af Amer: >90 mL/min (ref >90)
Glucose, Bld: 107 mg/dL — ABNORMAL HIGH (ref 70–99)
Potassium: 4.4 mEq/L (ref 3.5–5.1)
Sodium: 139 mEq/L (ref 135–145)
Total Bilirubin: 0.1 mg/dL — ABNORMAL LOW (ref 0.3–1.2)
Total Protein: 6.9 g/dL (ref 6.0–8.3)

## 2012-04-07 LAB — URINE MICROSCOPIC-ADD ON

## 2012-04-07 LAB — APTT: aPTT: 26 seconds (ref 24–37)

## 2012-04-07 LAB — SURGICAL PCR SCREEN
MRSA, PCR: POSITIVE — AB
Staphylococcus aureus: POSITIVE — AB

## 2012-04-07 LAB — PROTIME-INR
INR: 0.94 (ref 0.00–1.49)
Prothrombin Time: 12.5 seconds (ref 11.6–15.2)

## 2012-04-07 NOTE — Pre-Procedure Instructions (Signed)
EKG 4/13 EPIC, Clearance with note Dr Alwyn Ren on chart.

## 2012-04-10 ENCOUNTER — Other Ambulatory Visit: Payer: Self-pay | Admitting: Internal Medicine

## 2012-04-10 MED ORDER — METFORMIN HCL 1000 MG PO TABS: 1000.0000 mg | ORAL_TABLET | Freq: Two times a day (BID) | ORAL | Status: DC

## 2012-04-10 NOTE — Telephone Encounter (Signed)
RX sent

## 2012-04-10 NOTE — Telephone Encounter (Signed)
refill MetFORMIN HCl (Tab) 1000 MG Take 1/2 tablet  by mouth 2 (two) times daily with a meal # 90 last fill 8.15.13 Last oc 7.13.13 acute

## 2012-04-11 NOTE — H&P (Signed)
Raven [name] DOB: February 28, 1956  Chief Complaint: right knee pain  History of Present Illness The patient is a 56 year old female who comes in today for a preoperative History and Physical. The patient is scheduled for a right total knee arthroplasty to be performed by Dr. Georges Lynch. Raven Hillock, MD at [facility] on Wednesday April 12, 2012 . Patient has had long standing right knee pain. She had an MRI done early in 2013 which showed a meniscus tear. She had an arthroscopic menisectomy on the right knee in April. She had little improvement through physical therapy and after repeated aspirations.She also failed improvement with Indomethacin. She has had cortisone injections with short lived limited benefit. X-rays show joint space narrowing and osteophyte formation in the right knee. Due to failure of conservative measures the most predictable means for increased function and deceased pain in the right knee is a right total knee arthroplasty.  PCP: Dr. Milford Cage: [name], PA-C   Problem List/Past MedicalHistory Pain, Joint, Forearm (719.43) Medial Epicondylitis (726.31) Osteoarthritis, Knee (715.96) Acute Medial Meniscal Tear (836.0) Gastroesophageal Reflux Disease Chronic Cystitis Migraine Headache Osteoarthritis Diabetes Mellitus, Type II Vertigo Depression Hyperlipidemia Hypertension Hiatal Hernia Urinary Incontinence Urinary Tract Infection Measles Mumps Rubella Lichens Sclerosis  Allergies PENICILLIN.  Rash. VICODIN.  Dizziness, Nausea, Vomiting.   Family History Father. age 27; DDD, CABG x5 Mother. 76; DM type II; CAD Siblings. brother 55 CVA   Social History Current work status. working full time Tobacco use. never smoker Marital status. married Children. 3 Alcohol use. current drinker; drinks wine; only occasionally per week Exercise. Exercises rarely; does running / walking Tobacco / smoke exposure. no Living  situation. live with spouse Post-Surgical Plans. home with family   Medication History Indomethacin (25MG  Capsule, 2 (two) Oral every 12 hours,  Active. (take after meals) Robaxin (500MG  Tablet, 1 (one) Oral three times daily as needed for spasm,  Active. Estrace (0.1MG /GM Cream, Vaginal) Active. (four times weekly) Januvia (100MG  Tablet, 1/2 tablet Oral at bedtime) Active. Lisinopril (20MG  Tablet, Oral at bedtime) Active. MetFORMIN HCl (1000MG  Tablet, Oral two times daily) Active. NexIUM (40MG  Capsule DR, Oral) Active. Simvastatin (40MG  Tablet, Oral at bedtime) Active. Vagifem ( Tablet, Vaginal three times a week) Active. Sertraline HCl (50MG  Tablet, Oral daily) Active. Valium (5MG  Tablet, Oral as needed) Active. (migraines) Tramadol-Acetaminophen (37.5-325MG  Tablet, Oral every six hours, as needed) Active. Clobetasol Propionate E (0.05% Cream, External as needed) Active. Aspirin EC Low Strength (81MG  Tablet DR, Oral daily) Active. Fish Oil Burp-Less (1000MG  Capsule, Oral two times daily, as needed) Active. Rolaids Extra Strength (675-135MG  Tablet Chewable, Oral daily) Active. Calcium-Vitamin D (500MG  Capsule, Oral two times daily) Active. Vitamin B-Complex ( Oral daily) Active. Folic Acid Xtra ( Oral daily) Active. Vitamin C ( Oral daily) Active. Cetirizine HCl (10MG  Tablet, Oral daily) Active.    Past Surgical History Tubal Ligation. 1981 Hysterectomy. partial (non-cancerous) 1991 Breast Biopsy. left Arthroscopic Knee Surgery - Right. April 2013    Review of Systems General:Present- Night Sweats and Fatigue. Not Present- Chills, Fever, Weight Gain, Weight Loss and Memory Loss. Skin:Not Present- Hives, Itching, Rash, Eczema and Lesions. HEENT:Present- Tinnitus. Not Present- Headache, Double Vision, Visual Loss, Hearing Loss and Dentures. Respiratory:Present- Allergies. Not Present- Shortness of breath with exertion, Shortness of breath at rest, Coughing  up blood and Chronic Cough. Cardiovascular:Not Present- Chest Pain, Racing/skipping heartbeats, Difficulty Breathing Lying Down, Murmur, Swelling and Palpitations. Gastrointestinal:Not Present- Bloody Stool, Heartburn, Abdominal Pain, Vomiting, Nausea, Constipation, Diarrhea, Difficulty Swallowing, Jaundice and  Loss of appetitie. Female Genitourinary:Present- Incontinence and Urinating at Night. Not Present- Blood in Urine, Urinary frequency, Weak urinary stream, Discharge, Flank Pain, Painful Urination, Urgency and Urinary Retention. Musculoskeletal:Present- Muscle Pain, Joint Swelling, Joint Pain, Morning Stiffness and Spasms. Not Present- Muscle Weakness and Back Pain. Neurological:Not Present- Tremor, Dizziness, Blackout spells, Paralysis, Difficulty with balance and Weakness. Psychiatric:Not Present- Insomnia.   Vitals Weight: 195 lb Height: 68 in Body Surface Area: 2.06 m Body Mass Index: 29.65 kg/m Pulse: 88 (Regular) Resp.: 18 (Unlabored) BP: 108/85 (Sitting, Left Arm, Standard)    Physical Exam General Mental Status - Alert, cooperative and good historian. General Appearance- pleasant. Not in acute distress. Orientation- Oriented X3. Build & Nutrition- Overweight, Well nourished and Well developed. Head and Neck Head- normocephalic, atraumatic . Neck Global Assessment- supple. no bruit auscultated on the right and no bruit auscultated on the left. Eye Pupil- Bilateral- Regular and Round. Motion- Bilateral- EOMI. Chest and Lung Exam Auscultation: Breath sounds:- clear at anterior chest wall and - clear at posterior chest wall. Adventitious sounds:- No Adventitious sounds. Cardiovascular Auscultation:Rhythm- Regular rate and rhythm. Heart Sounds- S1 WNL and S2 WNL. Murmurs & Other Heart Sounds:Auscultation of the heart reveals - No Murmurs. Abdomen Palpation/Percussion:Tenderness- Abdomen is non-tender to palpation. Rigidity  (guarding)- Abdomen is soft. Auscultation:Auscultation of the abdomen reveals - Bowel sounds normal Female Genitourinary Not done, not pertinent to present illness Peripheral Vascular Upper Extremity: Palpation:- Pulses bilaterally normal. Lower Extremity: Palpation:- Pulses bilaterally normal Neurologic Examination of related systems reveals - normal muscle strength and tone in all extremities. Neurologic evaluation reveals - normal sensation. Musculoskeletal Normal painless motion in the hips. Tenderness along the medial joint line of both knees. Mild soft tissue swelling in both knees. No effusion or instability. Left knee 5 to 120 degrees. Right knee 10 to 115 degrees. No baker's cyst.   Assessment & Plan Osteoarthritis, Knee (715.96) Patient is admitted for a right total knee arthroplasty     Dimitri Ped, PA-C

## 2012-04-12 ENCOUNTER — Inpatient Hospital Stay (HOSPITAL_COMMUNITY): Payer: PRIVATE HEALTH INSURANCE | Admitting: Anesthesiology

## 2012-04-12 ENCOUNTER — Inpatient Hospital Stay (HOSPITAL_COMMUNITY): Payer: PRIVATE HEALTH INSURANCE

## 2012-04-12 ENCOUNTER — Encounter (HOSPITAL_COMMUNITY): Payer: Self-pay | Admitting: Anesthesiology

## 2012-04-12 ENCOUNTER — Encounter (HOSPITAL_COMMUNITY): Payer: Self-pay | Admitting: Orthopedic Surgery

## 2012-04-12 ENCOUNTER — Encounter (HOSPITAL_COMMUNITY)
Admission: RE | Disposition: A | Discharge status: Home / Self Care | Payer: Self-pay | Source: Ambulatory Visit | Attending: Orthopedic Surgery

## 2012-04-12 ENCOUNTER — Encounter (HOSPITAL_COMMUNITY): Payer: Self-pay | Admitting: *Deleted

## 2012-04-12 ENCOUNTER — Inpatient Hospital Stay (HOSPITAL_COMMUNITY)
Admission: RE | Admit: 2012-04-12 | Discharge: 2012-04-15 | DRG: 470 | Disposition: A | Discharge status: Home / Self Care | Payer: PRIVATE HEALTH INSURANCE | Source: Ambulatory Visit | Attending: Orthopedic Surgery | Admitting: Orthopedic Surgery

## 2012-04-12 DIAGNOSIS — Z8249 Family history of ischemic heart disease and other diseases of the circulatory system: Secondary | ICD-10-CM

## 2012-04-12 DIAGNOSIS — Z885 Allergy status to narcotic agent status: Secondary | ICD-10-CM

## 2012-04-12 DIAGNOSIS — F3289 Other specified depressive episodes: Secondary | ICD-10-CM | POA: Diagnosis present

## 2012-04-12 DIAGNOSIS — M24569 Contracture, unspecified knee: Secondary | ICD-10-CM | POA: Diagnosis present

## 2012-04-12 DIAGNOSIS — K449 Diaphragmatic hernia without obstruction or gangrene: Secondary | ICD-10-CM | POA: Diagnosis present

## 2012-04-12 DIAGNOSIS — E785 Hyperlipidemia, unspecified: Secondary | ICD-10-CM | POA: Diagnosis present

## 2012-04-12 DIAGNOSIS — Z79899 Other long term (current) drug therapy: Secondary | ICD-10-CM

## 2012-04-12 DIAGNOSIS — Z880 Allergy status to penicillin: Secondary | ICD-10-CM

## 2012-04-12 DIAGNOSIS — K219 Gastro-esophageal reflux disease without esophagitis: Secondary | ICD-10-CM | POA: Diagnosis present

## 2012-04-12 DIAGNOSIS — E119 Type 2 diabetes mellitus without complications: Secondary | ICD-10-CM

## 2012-04-12 DIAGNOSIS — M129 Arthropathy, unspecified: Secondary | ICD-10-CM | POA: Diagnosis present

## 2012-04-12 DIAGNOSIS — Z22322 Carrier or suspected carrier of Methicillin resistant Staphylococcus aureus: Secondary | ICD-10-CM

## 2012-04-12 DIAGNOSIS — M1711 Unilateral primary osteoarthritis, right knee: Secondary | ICD-10-CM

## 2012-04-12 DIAGNOSIS — F329 Major depressive disorder, single episode, unspecified: Secondary | ICD-10-CM | POA: Diagnosis present

## 2012-04-12 DIAGNOSIS — Z7982 Long term (current) use of aspirin: Secondary | ICD-10-CM

## 2012-04-12 DIAGNOSIS — I10 Essential (primary) hypertension: Secondary | ICD-10-CM | POA: Diagnosis present

## 2012-04-12 DIAGNOSIS — J309 Allergic rhinitis, unspecified: Secondary | ICD-10-CM | POA: Diagnosis present

## 2012-04-12 DIAGNOSIS — Z23 Encounter for immunization: Secondary | ICD-10-CM

## 2012-04-12 DIAGNOSIS — Z90710 Acquired absence of both cervix and uterus: Secondary | ICD-10-CM

## 2012-04-12 DIAGNOSIS — Z96659 Presence of unspecified artificial knee joint: Secondary | ICD-10-CM

## 2012-04-12 DIAGNOSIS — M1710 Unilateral primary osteoarthritis, unspecified knee: Principal | ICD-10-CM | POA: Diagnosis present

## 2012-04-12 HISTORY — PX: TOTAL KNEE ARTHROPLASTY: SHX125

## 2012-04-12 LAB — TYPE AND SCREEN
ABO/RH(D): A POS
Antibody Screen: NEGATIVE

## 2012-04-12 LAB — GLUCOSE, CAPILLARY
Glucose-Capillary: 176 mg/dL — ABNORMAL HIGH (ref 70–99)
Glucose-Capillary: 304 mg/dL — ABNORMAL HIGH (ref 70–99)
Glucose-Capillary: 251 mg/dL — ABNORMAL HIGH (ref 70–99)

## 2012-04-12 LAB — ABO/RH: ABO/RH(D): A POS

## 2012-04-12 SURGERY — ARTHROPLASTY, KNEE, TOTAL
Anesthesia: General | Site: Knee | Laterality: Right | Wound class: Clean

## 2012-04-12 MED ORDER — DEXAMETHASONE SODIUM PHOSPHATE 10 MG/ML IJ SOLN
INTRAMUSCULAR | Status: DC | PRN
  Administered 2012-04-12: 8 mg via INTRAVENOUS

## 2012-04-12 MED ORDER — HYDROMORPHONE HCL PF 1 MG/ML IJ SOLN
1.0000 mg | INTRAMUSCULAR | Status: DC | PRN
  Administered 2012-04-12 – 2012-04-13 (×4): 1 mg via INTRAVENOUS
  Filled 2012-04-12 (×4): qty 1

## 2012-04-12 MED ORDER — BUPIVACAINE LIPOSOME 1.3 % IJ SUSP
20.0000 mL | Freq: Once | INTRAMUSCULAR | Status: DC
  Filled 2012-04-12: qty 20

## 2012-04-12 MED ORDER — PHENOL 1.4 % MT LIQD: 1.0000 | OROMUCOSAL | Status: DC | PRN

## 2012-04-12 MED ORDER — METOCLOPRAMIDE HCL 5 MG/ML IJ SOLN
INTRAMUSCULAR | Status: AC
  Filled 2012-04-12: qty 2

## 2012-04-12 MED ORDER — ONDANSETRON HCL 4 MG/2ML IJ SOLN
INTRAMUSCULAR | Status: DC | PRN
  Administered 2012-04-12 (×2): 2 mg via INTRAVENOUS
  Administered 2012-04-12: 4 mg via INTRAVENOUS

## 2012-04-12 MED ORDER — SERTRALINE HCL 50 MG PO TABS
50.0000 mg | ORAL_TABLET | Freq: Every day | ORAL | Status: DC
  Administered 2012-04-13 – 2012-04-15 (×3): 50 mg via ORAL
  Filled 2012-04-12 (×3): qty 1

## 2012-04-12 MED ORDER — SODIUM CHLORIDE 0.9 % IR SOLN
Status: DC | PRN
  Administered 2012-04-12: 11:00:00

## 2012-04-12 MED ORDER — BUPIVACAINE LIPOSOME 1.3 % IJ SUSP
INTRAMUSCULAR | Status: DC | PRN
  Administered 2012-04-12: 20 mL

## 2012-04-12 MED ORDER — VANCOMYCIN HCL IN DEXTROSE 1-5 GM/200ML-% IV SOLN
1000.0000 mg | Freq: Two times a day (BID) | INTRAVENOUS | Status: AC
  Administered 2012-04-12: 1000 mg via INTRAVENOUS
  Filled 2012-04-12: qty 200

## 2012-04-12 MED ORDER — LINAGLIPTIN 5 MG PO TABS
5.0000 mg | ORAL_TABLET | Freq: Every day | ORAL | Status: DC
  Administered 2012-04-12 – 2012-04-15 (×4): 5 mg via ORAL
  Filled 2012-04-12 (×4): qty 1

## 2012-04-12 MED ORDER — POLYETHYLENE GLYCOL 3350 17 G PO PACK: 17.0000 g | PACK | Freq: Every day | ORAL | Status: DC | PRN

## 2012-04-12 MED ORDER — SIMVASTATIN 40 MG PO TABS
40.0000 mg | ORAL_TABLET | Freq: Every evening | ORAL | Status: DC
  Administered 2012-04-12 – 2012-04-15 (×4): 40 mg via ORAL
  Filled 2012-04-12 (×4): qty 1

## 2012-04-12 MED ORDER — ACETAMINOPHEN 10 MG/ML IV SOLN
INTRAVENOUS | Status: DC | PRN
  Administered 2012-04-12: 1000 mg via INTRAVENOUS

## 2012-04-12 MED ORDER — LACTATED RINGERS IV SOLN
INTRAVENOUS | Status: DC
  Administered 2012-04-13 (×2): via INTRAVENOUS

## 2012-04-12 MED ORDER — SCOPOLAMINE 1 MG/3DAYS TD PT72
1.0000 | MEDICATED_PATCH | TRANSDERMAL | Status: DC
  Administered 2012-04-12: 1.5 mg via TRANSDERMAL

## 2012-04-12 MED ORDER — FERROUS SULFATE 325 (65 FE) MG PO TABS
325.0000 mg | ORAL_TABLET | Freq: Three times a day (TID) | ORAL | Status: DC
  Administered 2012-04-12 – 2012-04-15 (×8): 325 mg via ORAL
  Filled 2012-04-12 (×11): qty 1

## 2012-04-12 MED ORDER — PROMETHAZINE HCL 25 MG/ML IJ SOLN
6.2500 mg | INTRAMUSCULAR | Status: AC | PRN
  Administered 2012-04-12 (×2): 6.25 mg via INTRAVENOUS

## 2012-04-12 MED ORDER — METFORMIN HCL 500 MG PO TABS
1000.0000 mg | ORAL_TABLET | Freq: Two times a day (BID) | ORAL | Status: DC
  Administered 2012-04-12 – 2012-04-15 (×6): 1000 mg via ORAL
  Filled 2012-04-12 (×8): qty 2

## 2012-04-12 MED ORDER — HYDROMORPHONE HCL PF 1 MG/ML IJ SOLN
INTRAMUSCULAR | Status: AC
  Filled 2012-04-12: qty 1

## 2012-04-12 MED ORDER — HYDROMORPHONE HCL PF 1 MG/ML IJ SOLN: 0.2500 mg | INTRAMUSCULAR | Status: DC | PRN

## 2012-04-12 MED ORDER — CISATRACURIUM BESYLATE (PF) 10 MG/5ML IV SOLN
INTRAVENOUS | Status: DC | PRN
  Administered 2012-04-12: 10 mg via INTRAVENOUS

## 2012-04-12 MED ORDER — LISINOPRIL 20 MG PO TABS
20.0000 mg | ORAL_TABLET | Freq: Every day | ORAL | Status: DC
  Administered 2012-04-12 – 2012-04-13 (×2): 20 mg via ORAL
  Filled 2012-04-12 (×4): qty 1

## 2012-04-12 MED ORDER — HYDROMORPHONE HCL PF 1 MG/ML IJ SOLN
INTRAMUSCULAR | Status: DC | PRN
  Administered 2012-04-12 (×2): 1 mg via INTRAVENOUS

## 2012-04-12 MED ORDER — OXYCODONE-ACETAMINOPHEN 5-325 MG PO TABS
2.0000 | ORAL_TABLET | ORAL | Status: DC | PRN
  Administered 2012-04-12 – 2012-04-15 (×14): 2 via ORAL
  Filled 2012-04-12 (×16): qty 2

## 2012-04-12 MED ORDER — FENTANYL CITRATE 0.05 MG/ML IJ SOLN
INTRAMUSCULAR | Status: DC | PRN
  Administered 2012-04-12 (×3): 50 ug via INTRAVENOUS

## 2012-04-12 MED ORDER — ONDANSETRON HCL 4 MG/2ML IJ SOLN
4.0000 mg | Freq: Four times a day (QID) | INTRAMUSCULAR | Status: DC | PRN
  Administered 2012-04-14: 4 mg via INTRAVENOUS
  Filled 2012-04-12: qty 2

## 2012-04-12 MED ORDER — ACETAMINOPHEN 325 MG PO TABS: 650.0000 mg | ORAL_TABLET | Freq: Four times a day (QID) | ORAL | Status: DC | PRN

## 2012-04-12 MED ORDER — RIVAROXABAN 10 MG PO TABS
10.0000 mg | ORAL_TABLET | Freq: Every day | ORAL | Status: DC
  Administered 2012-04-13 – 2012-04-15 (×3): 10 mg via ORAL
  Filled 2012-04-12 (×4): qty 1

## 2012-04-12 MED ORDER — METOCLOPRAMIDE HCL 5 MG/ML IJ SOLN
10.0000 mg | Freq: Once | INTRAMUSCULAR | Status: AC | PRN
  Administered 2012-04-12: 10 mg via INTRAVENOUS

## 2012-04-12 MED ORDER — ESTRADIOL 0.1 MG/GM VA CREA
1.0000 g | TOPICAL_CREAM | VAGINAL | Status: DC
  Administered 2012-04-13: 0.25 via VAGINAL
  Filled 2012-04-12: qty 42.5

## 2012-04-12 MED ORDER — SUFENTANIL CITRATE 50 MCG/ML IV SOLN
INTRAVENOUS | Status: DC | PRN
  Administered 2012-04-12: 10 ug via INTRAVENOUS
  Administered 2012-04-12: 20 ug via INTRAVENOUS
  Administered 2012-04-12 (×3): 10 ug via INTRAVENOUS
  Administered 2012-04-12: 20 ug via INTRAVENOUS
  Administered 2012-04-12: 10 ug via INTRAVENOUS

## 2012-04-12 MED ORDER — METHOCARBAMOL 100 MG/ML IJ SOLN
500.0000 mg | Freq: Four times a day (QID) | INTRAVENOUS | Status: DC | PRN
  Administered 2012-04-12: 500 mg via INTRAVENOUS
  Filled 2012-04-12: qty 5

## 2012-04-12 MED ORDER — LIDOCAINE HCL (CARDIAC) 20 MG/ML IV SOLN
INTRAVENOUS | Status: DC | PRN
  Administered 2012-04-12: 75 mg via INTRAVENOUS

## 2012-04-12 MED ORDER — THROMBIN 5000 UNITS EX SOLR
CUTANEOUS | Status: DC | PRN
  Administered 2012-04-12: 5000 [IU] via TOPICAL

## 2012-04-12 MED ORDER — PROPOFOL 10 MG/ML IV EMUL
INTRAVENOUS | Status: DC | PRN
  Administered 2012-04-12: 200 mg via INTRAVENOUS
  Administered 2012-04-12 (×2): 25 mg via INTRAVENOUS

## 2012-04-12 MED ORDER — PROMETHAZINE HCL 25 MG/ML IJ SOLN
INTRAMUSCULAR | Status: AC
  Filled 2012-04-12: qty 1

## 2012-04-12 MED ORDER — THROMBIN 5000 UNITS EX SOLR
CUTANEOUS | Status: AC
  Filled 2012-04-12: qty 5000

## 2012-04-12 MED ORDER — BISACODYL 10 MG RE SUPP: 10.0000 mg | Freq: Every day | RECTAL | Status: DC | PRN

## 2012-04-12 MED ORDER — LACTATED RINGERS IV SOLN
INTRAVENOUS | Status: DC
  Administered 2012-04-12: 1000 mL via INTRAVENOUS
  Administered 2012-04-12 (×2): via INTRAVENOUS

## 2012-04-12 MED ORDER — B COMPLEX-C PO TABS
1.0000 | ORAL_TABLET | Freq: Every day | ORAL | Status: DC
  Administered 2012-04-12 – 2012-04-15 (×4): 1 via ORAL
  Filled 2012-04-12 (×4): qty 1

## 2012-04-12 MED ORDER — BACITRACIN ZINC 500 UNIT/GM EX OINT
TOPICAL_OINTMENT | CUTANEOUS | Status: AC
  Filled 2012-04-12: qty 15

## 2012-04-12 MED ORDER — ONDANSETRON HCL 4 MG PO TABS: 4.0000 mg | ORAL_TABLET | Freq: Four times a day (QID) | ORAL | Status: DC | PRN

## 2012-04-12 MED ORDER — MENTHOL 3 MG MT LOZG: 1.0000 | LOZENGE | OROMUCOSAL | Status: DC | PRN

## 2012-04-12 MED ORDER — SODIUM CHLORIDE 0.9 % IJ SOLN
INTRAMUSCULAR | Status: DC | PRN
  Administered 2012-04-12: 20 mL via INTRAVENOUS

## 2012-04-12 MED ORDER — SODIUM CHLORIDE 0.9 % IR SOLN
Status: DC | PRN
  Administered 2012-04-12: 3000 mL

## 2012-04-12 MED ORDER — ACETAMINOPHEN 650 MG RE SUPP: 650.0000 mg | Freq: Four times a day (QID) | RECTAL | Status: DC | PRN

## 2012-04-12 MED ORDER — SCOPOLAMINE 1 MG/3DAYS TD PT72
MEDICATED_PATCH | TRANSDERMAL | Status: AC
  Filled 2012-04-12: qty 1

## 2012-04-12 MED ORDER — ALUM & MAG HYDROXIDE-SIMETH 200-200-20 MG/5ML PO SUSP: 30.0000 mL | ORAL | Status: DC | PRN

## 2012-04-12 MED ORDER — INSULIN ASPART 100 UNIT/ML ~~LOC~~ SOLN
0.0000 [IU] | Freq: Three times a day (TID) | SUBCUTANEOUS | Status: DC
  Administered 2012-04-12: 11 [IU] via SUBCUTANEOUS
  Administered 2012-04-13: 5 [IU] via SUBCUTANEOUS
  Administered 2012-04-13: 2 [IU] via SUBCUTANEOUS
  Administered 2012-04-14: 3 [IU] via SUBCUTANEOUS
  Administered 2012-04-14: 2 [IU] via SUBCUTANEOUS
  Administered 2012-04-14 – 2012-04-15 (×2): 3 [IU] via SUBCUTANEOUS

## 2012-04-12 MED ORDER — METHOCARBAMOL 500 MG PO TABS
500.0000 mg | ORAL_TABLET | Freq: Four times a day (QID) | ORAL | Status: DC | PRN
  Administered 2012-04-12 – 2012-04-15 (×9): 500 mg via ORAL
  Filled 2012-04-12 (×9): qty 1

## 2012-04-12 MED ORDER — MIDAZOLAM HCL 5 MG/5ML IJ SOLN
INTRAMUSCULAR | Status: DC | PRN
  Administered 2012-04-12 (×2): 1 mg via INTRAVENOUS

## 2012-04-12 MED ORDER — VANCOMYCIN HCL IN DEXTROSE 1-5 GM/200ML-% IV SOLN
1000.0000 mg | INTRAVENOUS | Status: AC
  Administered 2012-04-12: 1000 mg via INTRAVENOUS

## 2012-04-12 SURGICAL SUPPLY — 66 items
BAG ZIPLOCK 12X15 (MISCELLANEOUS) ×2 IMPLANT
BANDAGE ELASTIC 4 VELCRO ST LF (GAUZE/BANDAGES/DRESSINGS) ×2 IMPLANT
BANDAGE ELASTIC 6 VELCRO ST LF (GAUZE/BANDAGES/DRESSINGS) ×2 IMPLANT
BANDAGE ESMARK 6X9 LF (GAUZE/BANDAGES/DRESSINGS) ×1 IMPLANT
BANDAGE GAUZE ELAST BULKY 4 IN (GAUZE/BANDAGES/DRESSINGS) ×2 IMPLANT
BLADE SAG 18X100X1.27 (BLADE) ×2 IMPLANT
BLADE SAW SGTL 11.0X1.19X90.0M (BLADE) ×2 IMPLANT
BNDG ESMARK 6X9 LF (GAUZE/BANDAGES/DRESSINGS) ×2
BONE CEMENT GENTAMICIN (Cement) ×4 IMPLANT
CEMENT BONE GENTAMICIN 40 (Cement) ×2 IMPLANT
CLOTH BEACON ORANGE TIMEOUT ST (SAFETY) ×2 IMPLANT
CO AXIAL FAN SPRAY TIP SOFT SH (MISCELLANEOUS) ×2 IMPLANT
CUFF TOURN SGL QUICK 34 (TOURNIQUET CUFF) ×1
CUFF TRNQT CYL 34X4X40X1 (TOURNIQUET CUFF) ×1 IMPLANT
DECANTER SPIKE VIAL GLASS SM (MISCELLANEOUS) ×4 IMPLANT
DRAPE EXTREMITY T 121X128X90 (DRAPE) ×2 IMPLANT
DRAPE INCISE IOBAN 66X45 STRL (DRAPES) ×2 IMPLANT
DRAPE LG THREE QUARTER DISP (DRAPES) ×2 IMPLANT
DRAPE POUCH INSTRU U-SHP 10X18 (DRAPES) ×2 IMPLANT
DRAPE U-SHAPE 47X51 STRL (DRAPES) ×2 IMPLANT
DRSG ADAPTIC 3X8 NADH LF (GAUZE/BANDAGES/DRESSINGS) ×2 IMPLANT
DRSG EMULSION OIL 3X16 NADH (GAUZE/BANDAGES/DRESSINGS) ×2 IMPLANT
DRSG PAD ABDOMINAL 8X10 ST (GAUZE/BANDAGES/DRESSINGS) ×6 IMPLANT
DURAPREP 26ML APPLICATOR (WOUND CARE) ×2 IMPLANT
ELECT REM PT RETURN 9FT ADLT (ELECTROSURGICAL) ×2
ELECTRODE REM PT RTRN 9FT ADLT (ELECTROSURGICAL) ×1 IMPLANT
EVACUATOR 1/8 PVC DRAIN (DRAIN) ×2 IMPLANT
FACESHIELD LNG OPTICON STERILE (SAFETY) ×12 IMPLANT
GLOVE BIOGEL PI IND STRL 8 (GLOVE) ×1 IMPLANT
GLOVE BIOGEL PI IND STRL 8.5 (GLOVE) IMPLANT
GLOVE BIOGEL PI INDICATOR 8 (GLOVE) ×1
GLOVE BIOGEL PI INDICATOR 8.5 (GLOVE)
GLOVE ECLIPSE 8.0 STRL XLNG CF (GLOVE) ×4 IMPLANT
GLOVE SURG SS PI 6.5 STRL IVOR (GLOVE) ×4 IMPLANT
GOWN PREVENTION PLUS LG XLONG (DISPOSABLE) ×4 IMPLANT
GOWN STRL REIN XL XLG (GOWN DISPOSABLE) ×2 IMPLANT
HANDPIECE INTERPULSE COAX TIP (DISPOSABLE) ×1
IMMOBILIZER KNEE 20 (SOFTGOODS) ×2
IMMOBILIZER KNEE 20 THIGH 36 (SOFTGOODS) ×1 IMPLANT
KIT BASIN OR (CUSTOM PROCEDURE TRAY) ×2 IMPLANT
MANIFOLD NEPTUNE II (INSTRUMENTS) ×2 IMPLANT
NEEDLE HYPO 22GX1.5 SAFETY (NEEDLE) ×2 IMPLANT
NS IRRIG 1000ML POUR BTL (IV SOLUTION) ×2 IMPLANT
PACK TOTAL JOINT (CUSTOM PROCEDURE TRAY) ×2 IMPLANT
POSITIONER SURGICAL ARM (MISCELLANEOUS) ×2 IMPLANT
SET HNDPC FAN SPRY TIP SCT (DISPOSABLE) ×1 IMPLANT
SET PAD KNEE POSITIONER (MISCELLANEOUS) ×2 IMPLANT
SPONGE GAUZE 4X4 12PLY (GAUZE/BANDAGES/DRESSINGS) ×2 IMPLANT
SPONGE LAP 18X18 X RAY DECT (DISPOSABLE) ×2 IMPLANT
SPONGE SURGIFOAM ABS GEL 100 (HEMOSTASIS) ×2 IMPLANT
STAPLER VISISTAT 35W (STAPLE) IMPLANT
SUCTION FRAZIER 12FR DISP (SUCTIONS) ×2 IMPLANT
SUT BONE WAX W31G (SUTURE) ×2 IMPLANT
SUT VIC AB 0 CT1 27 (SUTURE) ×2
SUT VIC AB 0 CT1 27XBRD ANTBC (SUTURE) ×2 IMPLANT
SUT VIC AB 1 CT1 27 (SUTURE) ×5
SUT VIC AB 1 CT1 27XBRD ANTBC (SUTURE) ×5 IMPLANT
SUT VIC AB 2-0 CT1 27 (SUTURE) ×3
SUT VIC AB 2-0 CT1 TAPERPNT 27 (SUTURE) ×3 IMPLANT
SUT VIC AB 4-0 PS2 18 (SUTURE) ×2 IMPLANT
SYR 20CC LL (SYRINGE) ×2 IMPLANT
TOWEL OR 17X26 10 PK STRL BLUE (TOWEL DISPOSABLE) ×4 IMPLANT
TOWER CARTRIDGE SMART MIX (DISPOSABLE) ×2 IMPLANT
TRAY FOLEY CATH 14FRSI W/METER (CATHETERS) ×2 IMPLANT
WATER STERILE IRR 1500ML POUR (IV SOLUTION) ×4 IMPLANT
WRAP KNEE MAXI GEL POST OP (GAUZE/BANDAGES/DRESSINGS) ×2 IMPLANT

## 2012-04-12 NOTE — Anesthesia Postprocedure Evaluation (Signed)
  Anesthesia Post-op Note  Patient: Raven [name]  Procedure(s) Performed: Procedure(s) (LRB): TOTAL KNEE ARTHROPLASTY (Right)  Patient Location: PACU  Anesthesia Type: General  Level of Consciousness: awake and alert   Airway and Oxygen Therapy: Patient Spontanous Breathing  Post-op Pain: mild  Post-op Assessment: Post-op Vital signs reviewed, Patient's Cardiovascular Status Stable, Respiratory Function Stable, Patent Airway and No signs of Nausea or vomiting  Post-op Vital Signs: stable  Complications: No apparent anesthesia complications

## 2012-04-12 NOTE — Transfer of Care (Signed)
Immediate Anesthesia Transfer of Care Note  Patient: Raven [name]  Procedure(s) Performed: Procedure(s) (LRB) with comments: TOTAL KNEE ARTHROPLASTY (Right) - Right Total Knee Arthroplasty  Patient Location: PACU  Anesthesia Type: General  Level of Consciousness: awake, alert , oriented and patient cooperative  Airway & Oxygen Therapy: Patient Spontanous Breathing and Patient connected to face mask oxygen  Post-op Assessment: Report given to PACU RN, Post -op Vital signs reviewed and stable and Patient moving all extremities  Post vital signs: Reviewed and stable  Complications: No apparent anesthesia complications

## 2012-04-12 NOTE — Anesthesia Preprocedure Evaluation (Signed)
Anesthesia Evaluation  Patient identified by MRN, date of birth, ID band Patient awake    Reviewed: Allergy & Precautions, H&P , NPO status , Patient's Chart, lab work & pertinent test results  Airway Mallampati: II TM Distance: >3 FB Neck ROM: Full    Dental No notable dental hx.    Pulmonary neg pulmonary ROS,  breath sounds clear to auscultation  Pulmonary exam normal       Cardiovascular hypertension, Pt. on medications Rhythm:Regular Rate:Normal     Neuro/Psych negative neurological ROS  negative psych ROS   GI/Hepatic negative GI ROS, Neg liver ROS,   Endo/Other  diabetes, Type 2  Renal/GU negative Renal ROS  negative genitourinary   Musculoskeletal negative musculoskeletal ROS (+)   Abdominal   Peds negative pediatric ROS (+)  Hematology negative hematology ROS (+)   Anesthesia Other Findings   Reproductive/Obstetrics negative OB ROS                           Anesthesia Physical Anesthesia Plan  ASA: II  Anesthesia Plan: General   Post-op Pain Management:    Induction: Intravenous  Airway Management Planned: Oral ETT  Additional Equipment:   Intra-op Plan:   Post-operative Plan: Extubation in OR  Informed Consent: I have reviewed the patients History and Physical, chart, labs and discussed the procedure including the risks, benefits and alternatives for the proposed anesthesia with the patient or authorized representative who has indicated his/her understanding and acceptance.   Dental advisory given  Plan Discussed with: CRNA and Surgeon  Anesthesia Plan Comments:         Anesthesia Quick Evaluation  

## 2012-04-12 NOTE — Preoperative (Signed)
Beta Blockers   Reason not to administer Beta Blockers:Not Applicable 

## 2012-04-12 NOTE — Transfer of Care (Signed)
Immediate Anesthesia Transfer of Care Note  Patient: Raven [name]  Procedure(s) Performed: Procedure(s) (LRB) with comments: TOTAL KNEE ARTHROPLASTY (Right) - Right Total Knee Arthroplasty  Patient Location: PACU  Anesthesia Type: General  Level of Consciousness: awake, oriented, patient cooperative and responds to stimulation  Airway & Oxygen Therapy: Patient Spontanous Breathing and Patient connected to face mask oxygen  Post-op Assessment: Report given to PACU RN, Post -op Vital signs reviewed and stable and Patient moving all extremities  Post vital signs: Reviewed and stable  Complications: No apparent anesthesia complications

## 2012-04-12 NOTE — Brief Op Note (Signed)
04/12/2012  12:36 PM  PATIENT:  Raven [name]  56 y.o. female  PRE-OPERATIVE DIAGNOSIS:  Osteoarthritis of the Right Knee  POST-OPERATIVE DIAGNOSIS:  Osteoarthritis of the Right Knee  PROCEDURE:  Procedure(s) (LRB) with comments: TOTAL KNEE ARTHROPLASTY (Right) - Right Total Knee Arthroplasty  SURGEON:  Surgeon(s) and Role:    * Jacki Cones, MD - Primary  PHYSICIAN ASSISTANT: Dimitri Ped PA    ANESTHESIA:   general  EBL:  Total I/O In: 1000 [I.V.:1000] Out: 250 [Urine:250]  BLOOD ADMINISTERED:none  DRAINS: (1) Hemovact drain(s) in the Right with  Suction Open   LOCAL MEDICATIONS USED:  BUPIVICAINE 20cc mixed with 20cc Normal Saline  SPECIMEN:  No Specimen  DISPOSITION OF SPECIMEN:  N/A  COUNTS:  YES  TOURNIQUET:   Total Tourniquet Time Documented: Thigh (Right) - 90 minutes  DICTATION: .Other Dictation: Dictation Number [id number]  PLAN OF CARE: Admit to inpatient   PATIENT DISPOSITION:  Stable in OR   Delay start of Pharmacological VTE agent (>24hrs) due to surgical blood loss or risk of bleeding: yes

## 2012-04-12 NOTE — Interval H&P Note (Signed)
History and Physical Interval Note:  04/12/2012 10:22 AM  Raven [name]  has presented today for surgery, with the diagnosis of Osteoarthritis of the Right Knee  The various methods of treatment have been discussed with the patient and family. After consideration of risks, benefits and other options for treatment, the patient has consented to  Procedure(s) (LRB) with comments: TOTAL KNEE ARTHROPLASTY (Right) - Right Total Knee Arthroplasty as a surgical intervention .  The patient's history has been reviewed, patient examined, no change in status, stable for surgery.  I have reviewed the patient's chart and labs.  Questions were answered to the patient's satisfaction.     Krysteena Stalker A

## 2012-04-13 ENCOUNTER — Encounter (HOSPITAL_COMMUNITY): Payer: Self-pay | Admitting: Orthopedic Surgery

## 2012-04-13 LAB — BASIC METABOLIC PANEL
CO2: 24 mEq/L (ref 19–32)
Calcium: 8.6 mg/dL (ref 8.4–10.5)
Chloride: 99 mEq/L (ref 96–112)
Glucose, Bld: 158 mg/dL — ABNORMAL HIGH (ref 70–99)
Sodium: 134 mEq/L — ABNORMAL LOW (ref 135–145)

## 2012-04-13 LAB — CBC
Hemoglobin: 9.7 g/dL — ABNORMAL LOW (ref 12.0–15.0)
MCH: 25.1 pg — ABNORMAL LOW (ref 26.0–34.0)
MCV: 76 fL — ABNORMAL LOW (ref 78.0–100.0)
RBC: 3.87 MIL/uL (ref 3.87–5.11)
WBC: 14 10*3/uL — ABNORMAL HIGH (ref 4.0–10.5)

## 2012-04-13 LAB — GLUCOSE, CAPILLARY
Glucose-Capillary: 146 mg/dL — ABNORMAL HIGH (ref 70–99)
Glucose-Capillary: 208 mg/dL — ABNORMAL HIGH (ref 70–99)
Glucose-Capillary: 223 mg/dL — ABNORMAL HIGH (ref 70–99)

## 2012-04-13 NOTE — Progress Notes (Signed)
CSW consulted for SNF placement . PN reviewed. PT is recommending HHPT following hospital d/c. RNCM will assist with d/c planning needs. CSW is available to assist with SNF placement if plan changes.  Cori Razor LCSW [phone]

## 2012-04-13 NOTE — Op Note (Signed)
Raven [name], TROSPER             ACCOUNT NO.:  [id number]  MEDICAL RECORD NO.:  [id number]  LOCATION:  1607                         FACILITY:  [facility]  PHYSICIAN:  Georges Lynch. Tommey Barret, M.D.DATE OF BIRTH:  30-Jan-1956  DATE OF PROCEDURE:  04/12/2012 DATE OF DISCHARGE:                              OPERATIVE REPORT   SURGEON:  Georges Lynch. [name], MD  ASSISTANT:  Dimitri Ped, PA  PREOPERATIVE DIAGNOSES: 1. Severe degenerative arthritis of the right knee with bone on bone. 2. Flexion contracture, right knee.  POSTOPERATIVE DIAGNOSES: 1. Severe degenerative arthritis of the right knee with bone on bone. 2. Flexion contracture, right knee.  OPERATION:  Right total knee arthroplasty utilizing DePuy system.  All three components were cemented.  The patella was a size 35 patella.  The femoral component was a size 3 right posterior cruciate sacrificing femoral component.  Tibial tray was a size 3.  The insert size 3 with a 10-mm thickness rotating platform.  PROCEDURE:  Under general anesthesia, the patient on the regular operating table and the right leg in a DeMayo knee holder.  Appropriate time-out was first carried out.  I also marked the appropriate right leg in the holding area.  Once we went through the time-out, we then exsanguinated the leg with an Esmarch, tourniquet was elevated at 325 mmHg.  At this time, the knee was flexed and incision was made over the anterior aspect of left knee.  Bleeders were identified and cauterized. She also had vancomycin 1 g preop.  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  After incision was made, two flaps were created.  I then carried out a median parapatellar incision.  I reflected the patella laterally and then did medial and lateral meniscectomies and excised the anterior and posterior cruciate ligaments.  At this time, I then made my initial drill hole in the intercondylar notch to the femur. I removed 12 mm of thickness  distal femur.  I then measured the femur to be a size 3.  I cut my femur for a size 3 anterior, posterior and chamfer cuts.  After this, we carried out the tibia in the usual fashion.  We did use the intramedullary guide rod on the tibia.  I then did my initial cut, removed 6 mm thickness off the affected medial side. I then made sure we had good soft tissue release.  We irrigated the knee.  We did utilize our spacers for flexion and extension.  We had excellent stability.  Following that, I then made sure we had no posterior spurs on the distal femur.  At that particular time, I then cut my keel cut of the tibial plateau.  We did measure tibia to be a size 3 and we then made our notch cut out of the distal femur in the usual fashion.  We inserted our trial components and took the knee through motion, and had excellent stability and excellent motion with a size 10-mm thickness insert.  Then, we did a resurfacing procedure on the patella.  Three drill holes were made in the patella in the usual fashion.  We thoroughly removed all trial components, irrigated the knee, water  picked the knee, dried the knee, and cemented all three components in simultaneously.  At that time, we then removed all loose pieces of cement.  We removed the trial insert, searched posteriorly, make sure there was no loose pieces of cement.  I then injected the soft tissue with a mixture of 20 mL of Exparel with 20 mL of normal saline. I used half of the mixture initially and the other half at the end of the procedure.  I then searched for cement.  We did irrigate the knee out prior to injecting the Exparel.  I put some thrombin-soaked Gelfoam in the posterior compartment and then inserted my permanent 10-mm thickness rotating platform size 3, reduced the knee.  We took the knee through motion and had excellent motion.  The patella was well located as well.  I then inserted a Hemovac drain after we irrigated out  the knee and closed the wound layers in usual fashion.  We then used the remaining mixture of Exparel with normal saline prior to closing the skin.  Sterile dressings were applied.          ______________________________ Georges Lynch [name], M.D.     RAG/MEDQ  D:  04/12/2012  T:  04/13/2012  Job:  [id number]  cc:   Titus Dubin. Alwyn Ren, MD,FACP,FCCP [phone] [place], [place] [address]

## 2012-04-13 NOTE — Progress Notes (Signed)
Patient:  KENNEDEY, DIGILIO   Account Number:  [id number]  Date Initiated:  04/13/2012  Documentation initiated by:  Colleen Can  Subjective/Objective Assessment:   dx osteoarthritis knee-right; total knee replacemnt     Action/Plan:   CM spoke with patient and spouse. Plans are for patientto return to home in [facility]) [place]. Already has RW, shower chair, BSC and wheelchair. Wants HH agency in network   Anticipated DC Date:  04/14/2012   Anticipated DC Plan:  HOME W HOME HEALTH SERVICES  In-house referral  NA      DC Planning Services  CM consult      [facility] Choice  HOME HEALTH   Choice offered to / List presented to:  C-1 Patient   DME arranged  NA      DME agency  NA        Status of service:  In process, will continue to follow Comments:  04/13/2012 [name] BSN CCM [phone] [facility] unable to provide services. [facility] will call [facility] regarding whether they can provide services. CM will follow

## 2012-04-13 NOTE — Progress Notes (Signed)
Subjective: Doing Well today. Will ambulate   Objective: Vital signs in last 24 hours: Temp:  [97.8 F (36.6 C)-99.5 F (37.5 C)] 99.5 F (37.5 C) (10/03 0621) Pulse Rate:  [82-98] 85  (10/03 0621) Resp:  [15-16] 16  (10/03 0621) BP: (130-169)/(65-89) 169/83 mmHg (10/03 0621) SpO2:  [97 %-100 %] 100 % (10/03 0621) Weight:  [86.637 kg (191 lb)] 86.637 kg (191 lb) (10/02 1424)  Intake/Output from previous day: 10/02 0701 - 10/03 0700 In: 4229.7 [P.O.:480; I.V.:3549.7; IV Piggyback:200] Out: 4740 [Urine:4725; Drains:15] Intake/Output this shift:     Basename 04/13/12 0353  HGB 9.7*    Basename 04/13/12 0353  WBC 14.0*  RBC 3.87  HCT 29.4*  PLT 298    Basename 04/13/12 0353  NA 134*  K 4.5  CL 99  CO2 24  BUN 9  CREATININE 0.65  GLUCOSE 158*  CALCIUM 8.6   No results found for this basename: LABPT:2,INR:2 in the last 72 hours  Dorsiflexion/Plantar flexion intact  Assessment/Plan: Plan on DC Sat.   Raven [name] 04/13/2012, 7:36 AM

## 2012-04-13 NOTE — Evaluation (Signed)
Physical Therapy Evaluation Patient Details Name: Raven [name] MRN: [id number] DOB: [birth year] Today's Date: 04/13/2012 Time: 3086-5784 PT Time Calculation (min): 34 min  PT Assessment / Plan / Recommendation Clinical Impression  Pt s/p R TKR.  Pt would benefit from acute PT services in order to improve independence with transfers, ambulation, and stairs by increasing strength and ROM of R LE to prepare for d/c home with spouse.    PT Assessment  Patient needs continued PT services    Follow Up Recommendations  Home health PT    Barriers to Discharge        Equipment Recommendations  None recommended by PT    Recommendations for Other Services     Frequency 7X/week    Precautions / Restrictions Precautions Precautions: Knee Required Braces or Orthoses: Knee Immobilizer - Right Knee Immobilizer - Right: Discontinue once straight leg raise with < 10 degree lag   Pertinent Vitals/Pain 7/10 R knee pain, premedicated, ice applied      Mobility  Bed Mobility Bed Mobility: Sit to Supine Sit to Supine: 4: Min assist Details for Bed Mobility Assistance: assist for R LE onto bed, verbal cues for technique Transfers Transfers: Stand to Sit;Sit to Stand Sit to Stand: 4: Min assist;With upper extremity assist;From chair/3-in-1 Stand to Sit: 4: Min assist;With upper extremity assist;To bed Details for Transfer Assistance: verbal cues for safe technique, assist to rise and control descent Ambulation/Gait Ambulation/Gait Assistance: 4: Min guard Ambulation Distance (Feet): 60 Feet Assistive device: Rolling walker Ambulation/Gait Assistance Details: verbal cues for technique, posture, RW distance, step length Gait Pattern: Step-to pattern;Decreased stance time - right;Antalgic    Shoulder Instructions     Exercises Total Joint Exercises Ankle Circles/Pumps: AROM;Both;20 reps Quad Sets: AROM;Both;20 reps Short Arc QuadBarbaraann Boys;Right;15 reps Heel Slides: AAROM;Right;15  reps;Supine Hip ABduction/ADduction: AAROM;Right;15 reps   PT Diagnosis: Difficulty walking;Acute pain  PT Problem List: Decreased strength;Decreased range of motion;Decreased activity tolerance;Decreased mobility;Decreased knowledge of use of DME;Decreased knowledge of precautions;Pain PT Treatment Interventions: DME instruction;Gait training;Functional mobility training;Therapeutic activities;Therapeutic exercise;Patient/family education;Stair training   PT Goals Acute Rehab PT Goals PT Goal Formulation: With patient Time For Goal Achievement: 04/20/12 Potential to Achieve Goals: Good Pt will go Supine/Side to Sit: with supervision PT Goal: Supine/Side to Sit - Progress: Goal set today Pt will go Sit to Supine/Side: with supervision PT Goal: Sit to Supine/Side - Progress: Goal set today Pt will go Sit to Stand: with supervision PT Goal: Sit to Stand - Progress: Goal set today Pt will go Stand to Sit: with supervision PT Goal: Stand to Sit - Progress: Goal set today Pt will Ambulate: >150 feet;with supervision;with least restrictive assistive device PT Goal: Ambulate - Progress: Goal set today Pt will Go Up / Down Stairs: 1-2 stairs;with supervision;with least restrictive assistive device PT Goal: Up/Down Stairs - Progress: Goal set today Pt will Perform Home Exercise Program: with supervision, verbal cues required/provided PT Goal: Perform Home Exercise Program - Progress: Goal set today  Visit Information  Last PT Received On: 04/13/12 Assistance Needed: +1    Subjective Data  Subjective: The pain is still there but it's better now.  (increased pain this morning)   Prior Functioning  Home Living Lives With: Spouse Type of Home: House Home Access: Stairs to enter [employer] of Steps: 2 Entrance Stairs-Rails: None Home Layout: One level Home Adaptive Equipment: Walker - rolling;Straight cane;Bedside commode/3-in-1 Prior Function Level of Independence:  Independent Communication Communication: No difficulties    Cognition  Overall  Cognitive Status: Appears within functional limits for tasks assessed/performed Arousal/Alertness: Awake/alert Orientation Level: Appears intact for tasks assessed Behavior During Session: [facility] for tasks performed    Extremity/Trunk Assessment Right Upper Extremity Assessment RUE ROM/Strength/Tone: [facility] for tasks assessed Left Upper Extremity Assessment LUE ROM/Strength/Tone: WFL for tasks assessed Right Lower Extremity Assessment RLE ROM/Strength/Tone: Deficits RLE ROM/Strength/Tone Deficits: fair quad contraction, AAROM -5-30* limited by guarding and pain Left Lower Extremity Assessment LLE ROM/Strength/Tone: WFL for tasks assessed   Balance    End of Session PT - End of Session Equipment Utilized During Treatment: Gait belt;Right knee immobilizer Activity Tolerance: Patient limited by pain Patient left: in bed;with call bell/phone within reach;with family/visitor present  GP     Bolton Canupp,KATHrine E 04/13/2012, 2:52 PM Pager: [phone]

## 2012-04-14 LAB — GLUCOSE, CAPILLARY
Glucose-Capillary: 175 mg/dL — ABNORMAL HIGH (ref 70–99)
Glucose-Capillary: 185 mg/dL — ABNORMAL HIGH (ref 70–99)

## 2012-04-14 LAB — BASIC METABOLIC PANEL
CO2: 23 mEq/L (ref 19–32)
GFR calc non Af Amer: >90 mL/min (ref >90)
Glucose, Bld: 202 mg/dL — ABNORMAL HIGH (ref 70–99)
Potassium: 3.8 mEq/L (ref 3.5–5.1)
Sodium: 130 mEq/L — ABNORMAL LOW (ref 135–145)

## 2012-04-14 LAB — CBC
Hemoglobin: 8.9 g/dL — ABNORMAL LOW (ref 12.0–15.0)
Platelets: 241 10*3/uL (ref 150–400)
RBC: 3.53 MIL/uL — ABNORMAL LOW (ref 3.87–5.11)

## 2012-04-14 MED ORDER — OXYCODONE-ACETAMINOPHEN 5-325 MG PO TABS: 1.0000 | ORAL_TABLET | ORAL | Status: DC | PRN

## 2012-04-14 MED ORDER — RIVAROXABAN 10 MG PO TABS: 10.0000 mg | ORAL_TABLET | Freq: Every day | ORAL | Status: DC

## 2012-04-14 MED ORDER — METHOCARBAMOL 500 MG PO TABS: 500.0000 mg | ORAL_TABLET | Freq: Three times a day (TID) | ORAL | Status: DC | PRN

## 2012-04-14 MED ORDER — FERROUS SULFATE 325 (65 FE) MG PO TABS: 325.0000 mg | ORAL_TABLET | Freq: Three times a day (TID) | ORAL | Status: DC

## 2012-04-14 NOTE — Progress Notes (Signed)
04/14/2012 [name] BSN CCM [phone] [facility] does not have availability to end of next for hh pt so cannot service pt. [facility] called and will get back with me. 10/2 :1315 [facility] will be able to provide [facility] services per Kristin-HH services to start day after discharge.

## 2012-04-14 NOTE — Evaluation (Signed)
Occupational Therapy Evaluation Patient Details Name: Raven [name] MRN: [id number] DOB: 06/01/1956 Today's Date: 04/14/2012 Time: 4098-1191 OT Time Calculation (min): 35 min  OT Assessment / Plan / Recommendation Clinical Impression  Pt doing well POD 2 RTKR. All education completed. Pt will have necessary asssit upon d/c. Pt has all necessary DME.    OT Assessment  Patient does not need any further OT services    Follow Up Recommendations  No OT follow up    Barriers to Discharge      Equipment Recommendations  None recommended by OT    Recommendations for Other Services    Frequency       Precautions / Restrictions Precautions Precautions: Knee Required Braces or Orthoses: Knee Immobilizer - Right Knee Immobilizer - Right: Discontinue once straight leg raise with < 10 degree lag Restrictions Weight Bearing Restrictions: No RLE Weight Bearing: Weight bearing as tolerated   Pertinent Vitals/Pain Reported 5/10 pain. Repositioned and cold applied.    ADL  Grooming: Performed;Wash/dry hands;Min guard Where Assessed - Grooming: Supported standing Upper Body Bathing: Simulated;Set up Where Assessed - Upper Body Bathing: Supported sitting Lower Body Bathing: Minimal assistance;Simulated Where Assessed - Lower Body Bathing: Supported sit to stand Upper Body Dressing: Performed;Set up Where Assessed - Upper Body Dressing: Unsupported sitting Lower Body Dressing: Performed;Minimal assistance Where Assessed - Lower Body Dressing: Supported sit to stand Toilet Transfer: Performed;Min guard Statistician Method: Sit to Barista: Raised toilet seat with arms (or 3-in-1 over toilet) Toileting - Clothing Manipulation and Hygiene: Performed;Supervision/safety Where Assessed - Engineer, mining and Hygiene: Sit to stand from 3-in-1 or toilet Equipment Used: Rolling walker;Sock aid;Reacher ADL Comments: Pt educated in use of AE for LB  ADLs. Pt states she feels comfortable using the equipment. Pt also stated she would be spongebathing until able to get into the shower.    OT Diagnosis:    OT Problem List:   OT Treatment Interventions:     OT Goals    Visit Information  Last OT Received On: 04/14/12 Assistance Needed: +1    Subjective Data  Subjective: My other knees not so hot either. Patient Stated Goal: Return home with prn A from daughter.   Prior Functioning     Home Living Lives With: Spouse Available Help at Discharge: Family Type of Home: House Home Access: Stairs to enter Secretary/administrator of Steps: 2 Entrance Stairs-Rails: None Home Layout: One level Bathroom Shower/Tub: Engineer, manufacturing systems: Standard Home Adaptive Equipment: Bedside commode/3-in-1;Walker - rolling;Straight cane;Shower chair without back Prior Function Level of Independence: Independent Able to Take Stairs?: Yes Driving: Yes Vocation: Full time employment Communication Communication: No difficulties Dominant Hand: Right         Vision/Perception     Cognition  Overall Cognitive Status: Appears within functional limits for tasks assessed/performed Arousal/Alertness: Awake/alert Orientation Level: Appears intact for tasks assessed Behavior During Session: [facility] for tasks performed    Extremity/Trunk Assessment Right Upper Extremity Assessment RUE ROM/Strength/Tone: [facility] for tasks assessed Left Upper Extremity Assessment LUE ROM/Strength/Tone: [facility] for tasks assessed     Mobility Bed Mobility Bed Mobility: Supine to Sit;Sit to Supine Supine to Sit: 4: Min assist;HOB elevated Sit to Supine: 5: Supervision;HOB elevated Details for Bed Mobility Assistance: A needed to mobilize R LE off the bed. Educated pt how to use sheet as a leg lifter. Pt was able to mobilize self BTB without difficulty. Transfers Sit to Stand: 4: Min guard;With upper extremity assist;From bed;From chair/3-in-1;With  armrests Stand to Sit: 4: Min guard;With upper extremity assist;With armrests;To bed;To chair/3-in-1 Details for Transfer Assistance: min VCs for hand placement and technique.     Shoulder Instructions     Exercise    Balance     End of Session OT - End of Session Equipment Utilized During Treatment: Right knee immobilizer Activity Tolerance: Patient tolerated treatment well Patient left: in bed;with call bell/phone within reach;with family/visitor present CPM Right Knee CPM Right Knee: Off  GO     Temperence Zenor A OTR/L [id number] 04/14/2012, 11:56 AM

## 2012-04-14 NOTE — Progress Notes (Signed)
Physical Therapy Treatment Patient Details Name: Raven [name] MRN: [id number] DOB: August 06, 1955 Today's Date: 04/14/2012 Time: 4098-1191 PT Time Calculation (min): 24 min  PT Assessment / Plan / Recommendation Comments on Treatment Session  Pt ambulated in hallway and performed exercises.    Follow Up Recommendations  Home health PT    Barriers to Discharge        Equipment Recommendations  None recommended by PT    Recommendations for Other Services    Frequency     Plan Discharge plan remains appropriate;Frequency remains appropriate    Precautions / Restrictions Precautions Precautions: Knee Required Braces or Orthoses: Knee Immobilizer - Right Knee Immobilizer - Right: Discontinue once straight leg raise with < 10 degree lag Restrictions Weight Bearing Restrictions: No RLE Weight Bearing: Weight bearing as tolerated   Pertinent Vitals/Pain 7/10 R knee pain, ice applied, premedicated    Mobility  Bed Mobility Bed Mobility: Supine to Sit;Sit to Supine Supine to Sit: 4: Min assist;HOB elevated;With rails Sit to Supine: 4: Min assist Details for Bed Mobility Assistance: assist for R LE onto bed, verbal cues for technique Transfers Transfers: Stand to Sit;Sit to Stand Sit to Stand: 4: Min guard;With upper extremity assist;From bed Stand to Sit: 4: Min guard;With upper extremity assist;To bed Details for Transfer Assistance: verbal cues for technique Ambulation/Gait Ambulation/Gait Assistance: 4: Min guard Ambulation Distance (Feet): 80 Feet Assistive device: Rolling walker Ambulation/Gait Assistance Details: verbal cues for technique, sequence, RW distance Gait Pattern: Step-to pattern;Decreased stance time - right;Antalgic    Exercises Total Joint Exercises Ankle Circles/Pumps: AROM;Both;20 reps Quad Sets: AROM;Both;20 reps Gluteal Sets: AROM;Both;20 reps Towel Squeeze: AROM;Both;20 reps Short Arc QuadBarbaraann Boys;Right;15 reps Heel Slides: AAROM;Right;15  reps;Supine Hip ABduction/ADduction: AAROM;Right;15 reps   PT Diagnosis:    PT Problem List:   PT Treatment Interventions:     PT Goals Acute Rehab PT Goals PT Goal: Supine/Side to Sit - Progress: Progressing toward goal PT Goal: Sit to Supine/Side - Progress: Progressing toward goal PT Goal: Sit to Stand - Progress: Progressing toward goal PT Goal: Stand to Sit - Progress: Progressing toward goal PT Goal: Ambulate - Progress: Progressing toward goal PT Goal: Perform Home Exercise Program - Progress: Progressing toward goal  Visit Information  Last PT Received On: 04/14/12 Assistance Needed: +1    Subjective Data  Subjective: The pain is much better today.   Cognition  Overall Cognitive Status: Appears within functional limits for tasks assessed/performed    Balance     End of Session PT - End of Session Equipment Utilized During Treatment: Right knee immobilizer Activity Tolerance: Patient tolerated treatment well Patient left: in bed;with call bell/phone within reach;with family/visitor present CPM Right Knee CPM Right Knee: Off   GP     Pedro Oldenburg,KATHrine E 04/14/2012, 11:06 AM Pager: [phone]

## 2012-04-14 NOTE — Progress Notes (Signed)
Physical Therapy Treatment Patient Details Name: Raven [name] MRN: [id number] DOB: 10/10/1955 Today's Date: 04/14/2012 Time: 5784-6962 PT Time Calculation (min): 24 min  PT Assessment / Plan / Recommendation Comments on Treatment Session  Pt agreed to ambulate this afternoon, however was feeling somewhat nauseous.  Declined stair training until am before D/C.     Follow Up Recommendations  Home health PT      Barriers to Discharge        Equipment Recommendations  None recommended by OT    Recommendations for Other Services    Frequency 7X/week   Plan Discharge plan remains appropriate;Frequency remains appropriate    Precautions / Restrictions Precautions Precautions: Knee Required Braces or Orthoses: Knee Immobilizer - Right Knee Immobilizer - Right: Discontinue once straight leg raise with < 10 degree lag Restrictions Weight Bearing Restrictions: No RLE Weight Bearing: Weight bearing as tolerated   Pertinent Vitals/Pain 4/10, ice packs applied    Mobility  Bed Mobility Bed Mobility: Supine to Sit;Sit to Supine Supine to Sit: 4: Min assist;HOB elevated Sit to Supine: 4: Min assist;HOB flat Details for Bed Mobility Assistance: Assist for RLE out of and into bed.  Pt was able to utilize sheet to assist RLE to EOB, however could not get trunk elevated without letting go of sheet.  Transfers Transfers: Stand to Sit;Sit to Stand Sit to Stand: 4: Min guard;With upper extremity assist;From bed;With armrests;From chair/3-in-1 Stand to Sit: 4: Min guard;With upper extremity assist;With armrests;To bed;To chair/3-in-1 Details for Transfer Assistance: Performed x 2 in order to use 3in1 over toilet with cues for hand placement and LE management.  Ambulation/Gait Ambulation/Gait Assistance: 4: Min guard Ambulation Distance (Feet): 80 Feet Assistive device: Rolling walker Ambulation/Gait Assistance Details: Min cues for equal step length, increased step width and upright  posture.  Gait Pattern: Step-to pattern;Decreased stance time - right;Antalgic    Exercises     PT Diagnosis:    PT Problem List:   PT Treatment Interventions:     PT Goals Acute Rehab PT Goals PT Goal Formulation: With patient Time For Goal Achievement: 04/20/12 Potential to Achieve Goals: Good Pt will go Supine/Side to Sit: with supervision PT Goal: Supine/Side to Sit - Progress: Progressing toward goal Pt will go Sit to Supine/Side: with supervision PT Goal: Sit to Supine/Side - Progress: Progressing toward goal Pt will go Sit to Stand: with supervision PT Goal: Sit to Stand - Progress: Progressing toward goal Pt will go Stand to Sit: with supervision PT Goal: Stand to Sit - Progress: Progressing toward goal Pt will Ambulate: >150 feet;with supervision;with least restrictive assistive device PT Goal: Ambulate - Progress: Progressing toward goal  Visit Information  Last PT Received On: 04/14/12 Assistance Needed: +1    Subjective Data  Subjective: I'm feeling kind of nauseous still   Cognition  Overall Cognitive Status: Appears within functional limits for tasks assessed/performed Arousal/Alertness: Awake/alert Orientation Level: Appears intact for tasks assessed Behavior During Session: [facility] for tasks performed    Balance     End of Session PT - End of Session Equipment Utilized During Treatment: Right knee immobilizer Activity Tolerance: Other (comment) (Limited by nausea) Patient left: in bed;with call bell/phone within reach;with family/visitor present Nurse Communication: Mobility status   GP     Page, Meribeth Mattes 04/14/2012, 4:08 PM

## 2012-04-15 LAB — CBC
Hemoglobin: 8.6 g/dL — ABNORMAL LOW (ref 12.0–15.0)
MCH: 25.3 pg — ABNORMAL LOW (ref 26.0–34.0)
MCHC: 33.3 g/dL (ref 30.0–36.0)
MCV: 75.9 fL — ABNORMAL LOW (ref 78.0–100.0)
RBC: 3.4 MIL/uL — ABNORMAL LOW (ref 3.87–5.11)

## 2012-04-15 LAB — GLUCOSE, CAPILLARY: Glucose-Capillary: 162 mg/dL — ABNORMAL HIGH (ref 70–99)

## 2012-04-15 NOTE — Progress Notes (Signed)
Discharged to family auto via wheelchair. Assessment unchanged from am. 

## 2012-04-15 NOTE — Progress Notes (Signed)
Physical Therapy Treatment Patient Details Name: Raven [name] MRN: [id number] DOB: 13-Mar-1956 Today's Date: 04/15/2012 Time: 4098-1191 PT Time Calculation (min): 41 min  PT Assessment / Plan / Recommendation Comments on Treatment Session  Stair training completed, reviewed HEP, pt doing well with ambulation and transfers. REady to DC home from PT standpoint.     Follow Up Recommendations  Home health PT     Does the patient have the potential to tolerate intense rehabilitation     Barriers to Discharge        Equipment Recommendations  None recommended by OT    Recommendations for Other Services    Frequency 7X/week   Plan Discharge plan remains appropriate;Frequency remains appropriate    Precautions / Restrictions Precautions Precautions: Knee Required Braces or Orthoses: Knee Immobilizer - Right Knee Immobilizer - Right: Discontinue once straight leg raise with < 10 degree lag Restrictions Weight Bearing Restrictions: No RLE Weight Bearing: Weight bearing as tolerated   Pertinent Vitals/Pain *5/10 R knee at rest and with walking Premedicated Ice applied**    Mobility  Bed Mobility Bed Mobility: Supine to Sit;Sit to Supine Supine to Sit: 4: Min assist;HOB elevated Sit to Supine: 4: Min assist;HOB flat Details for Bed Mobility Assistance: Assist for RLE out of and into bed.   Transfers Transfers: Stand to Sit;Sit to Stand Sit to Stand: With upper extremity assist;From bed;With armrests;6: Modified independent (Device/Increase time) Stand to Sit: With upper extremity assist;With armrests;To chair/3-in-1;6: Modified independent (Device/Increase time) Ambulation/Gait Ambulation/Gait Assistance: 6: Modified independent (Device/Increase time) Ambulation Distance (Feet): 150 Feet Assistive device: Rolling walker Gait Pattern: Step-to pattern;Decreased stance time - right;Antalgic Stairs: Yes Stairs Assistance: 5: Supervision Stairs Assistance Details (indicate  cue type and reason): VCs sequencing, husband stabilized RW Stair Management Technique: Backwards Number of Stairs: 2     Exercises Total Joint Exercises Ankle Circles/Pumps: AROM;Both;20 reps Quad Sets: AROM;Both;20 reps Short Arc QuadBarbaraann Boys;Right;15 reps Heel Slides: AAROM;Right;15 reps;Supine Hip ABduction/ADduction: AAROM;Right;15 reps   PT Diagnosis:    PT Problem List:   PT Treatment Interventions:     PT Goals Acute Rehab PT Goals PT Goal Formulation: With patient Time For Goal Achievement: 04/20/12 Potential to Achieve Goals: Good Pt will go Supine/Side to Sit: with supervision PT Goal: Supine/Side to Sit - Progress: Progressing toward goal Pt will go Sit to Supine/Side: with supervision Pt will go Sit to Stand: with supervision PT Goal: Sit to Stand - Progress: Met Pt will go Stand to Sit: with supervision PT Goal: Stand to Sit - Progress: Met Pt will Ambulate: >150 feet;with supervision;with least restrictive assistive device PT Goal: Ambulate - Progress: Met Pt will Go Up / Down Stairs: 1-2 stairs;with min assist;with rolling walker PT Goal: Up/Down Stairs - Progress: Met Pt will Perform Home Exercise Program: with min assist PT Goal: Perform Home Exercise Program - Progress: Met  Visit Information  Last PT Received On: 04/15/12 Assistance Needed: +1    Subjective Data  Subjective: I was up a lot using the bathroom last night.  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

## 2012-04-15 NOTE — Progress Notes (Signed)
    Subjective: 3 Days Post-Op Procedure(s) (LRB): TOTAL KNEE ARTHROPLASTY (Right) Patient reports pain as 3 on 0-10 scale and 5 on 0-10 scale.   Denies CP or SOB.  Voiding without difficulty. Positive flatus.  Objective: Vital signs in last 24 hours: Temp:  [97.8 F (36.6 C)-99.4 F (37.4 C)] 98.4 F (36.9 C) (10/05 0527) Pulse Rate:  [96-110] 96  (10/05 0527) Resp:  [16] 16  (10/05 0527) BP: (116-136)/(74-81) 136/81 mmHg (10/05 0527) SpO2:  [98 %-99 %] 99 % (10/05 0527)  Intake/Output from previous day: 10/04 0701 - 10/05 0700 In: 960 [P.O.:960] Out: -  Intake/Output this shift:    Labs:  Basename 04/15/12 0500 04/14/12 0405 04/13/12 0353  HGB 8.6* 8.9* 9.7*    Basename 04/15/12 0500 04/14/12 0405  WBC 12.4* 14.6*  RBC 3.40* 3.53*  HCT 25.8* 26.6*  PLT 246 241    Basename 04/14/12 0405 04/13/12 0353  NA 130* 134*  K 3.8 4.5  CL 95* 99  CO2 23 24  BUN 7 9  CREATININE 0.57 0.65  GLUCOSE 202* 158*  CALCIUM 8.2* 8.6   No results found for this basename: LABPT:2,INR:2 in the last 72 hours  Physical Exam: Neurologically intact Neurovascular intact Dorsiflexion/Plantar flexion intact Incision: dressing C/D/I Compartment soft  Assessment/Plan: 3 Days Post-Op Procedure(s) (LRB): TOTAL KNEE ARTHROPLASTY (Right) Discharge home  [place] for Dr. Venita Lick [facility] [phone] 04/15/2012, 8:05 AM

## 2012-04-17 NOTE — Progress Notes (Signed)
CARE MANAGEMENT NOTE 04/17/2012  Patient:  Raven [name], Raven [name]   Account Number:  [id number]  Date Initiated:  04/13/2012  Documentation initiated by:  Colleen Can  Subjective/Objective Assessment:   dx osteoarthritis knee-right; total knee replacemnt     Action/Plan:   CM spoke with patient and spouse. Plans are for patientto return to home in [facility]) [place]. Already has RW, shower chair, BSC and wheelchair. Wants HH agency in network   Anticipated DC Date:  04/14/2012   Anticipated DC Plan:  HOME W HOME HEALTH SERVICES  In-house referral  NA      DC Planning Services  CM consult      [facility] Choice  HOME HEALTH   Choice offered to / List presented to:  C-1 Patient   DME arranged  NA      DME agency  NA     HH arranged  HH-2 PT      HH agency  [facility]   Status of service:  Completed, signed off Medicare Important Message given?  NO (If response is "NO", the following Medicare IM given date fields will be blank) Date Medicare IM given:   Date Additional Medicare IM given:    Discharge Disposition:  HOME W HOME HEALTH SERVICES  Per UR Regulation:  Reviewed for med. necessity/level of care/duration of stay  If discussed at Long Length of Stay Meetings, dates discussed:    Comments:  04/14/2012 [name] BSN CCM [phone] Frances Furbish does not have availability to end of next for hh pt so cannot service pt. [facility] called and will get back with me. 10/2 :1315 [facility] will be able to provide [facility] services per Kristin-HH services to start day after discharge.

## 2012-04-17 NOTE — Discharge Summary (Signed)
Physician Discharge Summary   Patient ID: Raven [name] MRN: [id number] DOB/AGE: 57-Aug-1957 57 y.o.  Admit date: 04/12/2012 Discharge date: 04/15/2012  Primary Diagnosis:  Osteoarthritis right knee  Admission Diagnoses:  Past Medical History  Diagnosis Date  . Hyperlipidemia   . Migraine   . Hypertension   . Arthritis knees, hands, jaw  . Acute medial meniscus tear of right knee   . GERD (gastroesophageal reflux disease) per pt watches diet and take tums as needed  . Diabetes mellitus oral meds  . Anxiety   . Seasonal allergies   . Interstitial cystitis   . H/O hiatal hernia   . Lichen sclerosus of female genitalia    Discharge Diagnoses:   Active Problems:  Primary osteoarthritis of right knee S/P right total knee arthroplasty  Estimated Body mass index is 29.04 kg/(m^2) as calculated from the following:   Height as of this encounter: 5\' 8" (1.727 m).   Weight as of this encounter: 191 lb(86.637 kg). Procedure:  Procedure(s) (LRB): TOTAL KNEE ARTHROPLASTY (Right)   Consults: None  HPI: The patient is a 56 year old female who has had long standing right knee pain. She had an MRI done early in 2013 which showed a meniscus tear. She had an arthroscopic menisectomy on the right knee in April. She had little improvement through physical therapy and after repeated aspirations.She also failed improvement with Indomethacin. She has had cortisone injections with short lived limited benefit. X-rays show joint space narrowing and osteophyte formation in the right knee. Due to failure of conservative measures the most predictable means for increased function and deceased pain in the right knee is a right total knee arthroplasty.  Laboratory Data: Hospital Outpatient Visit on 04/07/2012  Component Date Value Range Status  . aPTT 04/07/2012 26  24 - 37 seconds Final  . Sodium 04/07/2012 139  135 - 145 mEq/L Final  . Potassium 04/07/2012 4.4  3.5 - 5.1 mEq/L Final  . Chloride  04/07/2012 103  96 - 112 mEq/L Final  . CO2 04/07/2012 24  19 - 32 mEq/L Final  . Glucose, Bld 04/07/2012 107* 70 - 99 mg/dL Final  . BUN 78/29/5621 22  6 - 23 mg/dL Final  . Creatinine, Ser 04/07/2012 0.79  0.50 - 1.10 mg/dL Final  . Calcium 30/86/5784 9.0  8.4 - 10.5 mg/dL Final  . Total Protein 04/07/2012 6.9  6.0 - 8.3 g/dL Final  . Albumin 69/62/9528 3.6  3.5 - 5.2 g/dL Final  . AST 41/32/4401 17  0 - 37 U/L Final  . ALT 04/07/2012 22  0 - 35 U/L Final  . Alkaline Phosphatase 04/07/2012 53  39 - 117 U/L Final  . Total Bilirubin 04/07/2012 0.1* 0.3 - 1.2 mg/dL Final  . GFR calc non Af Amer 04/07/2012 >90  >90 mL/min Final  . GFR calc Af Amer 04/07/2012 >90  >90 mL/min Final   Comment:                                 The eGFR has been calculated                          using the CKD EPI equation.                          This calculation has not been  validated in all clinical                          situations.                          eGFR's persistently                          <90 mL/min signify                          possible Chronic Kidney Disease.  Marland Kitchen Prothrombin Time 04/07/2012 12.5  11.6 - 15.2 seconds Final  . INR 04/07/2012 0.94  0.00 - 1.49 Final  . Color, Urine 04/07/2012 YELLOW  YELLOW Final  . APPearance 04/07/2012 CLEAR  CLEAR Final  . Specific Gravity, Urine 04/07/2012 1.016  1.005 - 1.030 Final  . pH 04/07/2012 5.5  5.0 - 8.0 Final  . Glucose, UA 04/07/2012 NEGATIVE  NEGATIVE mg/dL Final  . Hgb urine dipstick 04/07/2012 TRACE* NEGATIVE Final  . Bilirubin Urine 04/07/2012 NEGATIVE  NEGATIVE Final  . Ketones, ur 04/07/2012 NEGATIVE  NEGATIVE mg/dL Final  . Protein, ur 14/78/2956 NEGATIVE  NEGATIVE mg/dL Final  . Urobilinogen, UA 04/07/2012 0.2  0.0 - 1.0 mg/dL Final  . Nitrite 21/30/8657 NEGATIVE  NEGATIVE Final  . Leukocytes, UA 04/07/2012 SMALL* NEGATIVE Final  . MRSA, PCR 04/07/2012 POSITIVE* NEGATIVE Final   Comment: RESULT  CALLED TO, READ BACK BY AND VERIFIED WITH:                          ROBINSON,K. AT 1422 ON [id number] BY LOVE,T.  . Staphylococcus aureus 04/07/2012 POSITIVE* NEGATIVE Final   Comment:                                 The Xpert SA Assay (FDA                          approved for NASAL specimens                          in patients over 75 years of age),                          is one component of                          a comprehensive surveillance                          program.  Test performance has                          been validated by [employer] for patients greater                          than or equal to 26 year old.  It is not intended                          to diagnose infection nor to                          guide or monitor treatment.  . WBC 04/07/2012 9.3  4.0 - 10.5 K/uL Final  . RBC 04/07/2012 4.09  3.87 - 5.11 MIL/uL Final  . Hemoglobin 04/07/2012 10.2* 12.0 - 15.0 g/dL Final  . HCT 62/13/0865 31.1* 36.0 - 46.0 % Final  . MCV 04/07/2012 76.0* 78.0 - 100.0 fL Final  . MCH 04/07/2012 24.9* 26.0 - 34.0 pg Final  . MCHC 04/07/2012 32.8  30.0 - 36.0 g/dL Final  . RDW 78/46/9629 13.2  11.5 - 15.5 % Final  . Platelets 04/07/2012 357  150 - 400 K/uL Final  . Squamous Epithelial / LPF 04/07/2012 FEW* RARE Final  . WBC, UA 04/07/2012 7-10  <3 WBC/hpf Final  . RBC / HPF 04/07/2012 0-2  <3 RBC/hpf Final  . Bacteria, UA 04/07/2012 FEW* RARE Final    Basename 04/15/12 0500  HGB 8.6*    Basename 04/15/12 0500  WBC 12.4*  RBC 3.40*  HCT 25.8*  PLT 246   X-Rays:Dg Chest 2 View  04/07/2012  *RADIOLOGY REPORT*  Clinical Data: Preop  CHEST - 2 VIEW  Comparison: 03/03/2011  Findings: Normal heart size.  Clear lungs.  No pneumothorax.  No pleural effusion.  Round opacity projecting over the lower cardiac silhouette is favored represent a small hiatal hernia.  IMPRESSION: No active cardiopulmonary disease.  Probable small  hiatal hernia.   Original Report Authenticated By: Donavan Burnet, M.D.    Dg Knee Right Port  04/12/2012  *RADIOLOGY REPORT*  Clinical Data: Postop  PORTABLE RIGHT KNEE - 1-2 VIEW  Comparison: None.  Findings: The patient is status post right total knee replacement. Satisfactory position of the femoral and tibial components. Surgical drain good position.  IMPRESSION: As above.   Original Report Authenticated By: Elsie Stain, M.D.     EKG: Orders placed during the hospital encounter of 10/21/11  . EKG     Hospital Course:  Raven [name] is a 56 y.o. who was admitted to [facility]. They were brought to the operating room on 04/12/2012 and underwent Procedure(s): TOTAL KNEE ARTHROPLASTY.  Patient tolerated the procedure well and was later transferred to the recovery room and then to the orthopaedic floor for postoperative care.  They were given PO and IV analgesics for pain control following their surgery.  They were given 24 hours of postoperative antibiotics of  Anti-infectives     Start     Dose/Rate Route Frequency Ordered Stop   04/12/12 2200   vancomycin (VANCOCIN) IVPB 1000 mg/200 mL premix        1,000 mg 200 mL/hr over 60 Minutes Intravenous Every 12 hours 04/12/12 1433 04/12/12 2233   04/12/12 1107   polymyxin B 500,000 Units, bacitracin 50,000 Units in sodium chloride irrigation 0.9 % 500 mL irrigation  Status:  Discontinued          As needed 04/12/12 1107 04/12/12 1303   04/12/12 0733   vancomycin (VANCOCIN) IVPB 1000 mg/200 mL premix        1,000 mg 200 mL/hr over 60 Minutes Intravenous 60 min pre-op 04/12/12 0733 04/12/12 1015         and started on  DVT prophylaxis in the form of Xarelto.   PT and OT were ordered for total joint protocol.  Discharge planning consulted to help with postop disposition and equipment needs.  Patient had a decent night on the evening of surgery and started to get up OOB with therapy on day one.  Hemovac drain was pulled without  difficulty.  Continued to work with therapy into day two.  Dressing was changed on day two and the incision was clean and dry.  By day three, the patient had progressed with therapy and meeting their goals.  Incision was healing well.  Patient was seen in rounds and was ready to go home.  Discharge Medications: Prior to Admission medications   Medication Sig Start Date End Date Taking? Authorizing Provider  cetirizine (ZYRTEC) 10 MG tablet Take 10 mg by mouth every evening.    Yes Historical Provider, MD  diazepam (VALIUM) 5 MG tablet Take 5 mg by mouth as needed. Migraine headaches   Yes Historical Provider, MD  lisinopril (PRINIVIL,ZESTRIL) 20 MG tablet Take 20 mg by mouth at bedtime.   Yes Historical Provider, MD  metFORMIN (GLUCOPHAGE) 1000 MG tablet Take 1 tablet (1,000 mg total) by mouth 2 (two) times daily with a meal. 1 by mouth two times daily 04/10/12  Yes Pecola Lawless, MD  sertraline (ZOLOFT) 50 MG tablet Take 50 mg by mouth daily with breakfast.   Yes Historical Provider, MD  simvastatin (ZOCOR) 40 MG tablet Take 40 mg by mouth every evening.   Yes Historical Provider, MD  sitaGLIPtin (JANUVIA) 100 MG tablet Take 0.5 tablets (50 mg total) by mouth daily. 01/07/12  Yes Pecola Lawless, MD  clobetasol cream (TEMOVATE) 0.05 % Apply 1 application topically 2 (two) times daily.    Historical Provider, MD  estradiol (ESTRACE) 0.1 MG/GM vaginal cream Place 1 g vaginally 4 (four) times a week. Tuesday Thursday Saturday and sunday    Historical Provider, MD  ferrous sulfate 325 (65 FE) MG tablet Take 1 tablet (325 mg total) by mouth 3 (three) times daily after meals. 04/14/12   Tabor Denham Tamala Ser, PA  methocarbamol (ROBAXIN) 500 MG tablet Take 1 tablet (500 mg total) by mouth 3 (three) times daily as needed. Muscle spasms 04/14/12   Kerby Nora, PA  oxyCODONE-acetaminophen (PERCOCET/ROXICET) 5-325 MG per tablet Take 1-2 tablets by mouth every 4 (four) hours as needed (Q4-6 hours  PRN). 04/14/12   Raziah Funnell Tamala Ser, PA  rivaroxaban (XARELTO) 10 MG TABS tablet Take 1 tablet (10 mg total) by mouth daily with breakfast. 04/14/12   Krystopher Kuenzel Tamala Ser, PA    Diet: Diabetic diet Activity:WBAT Follow-up:in 2 weeks Disposition - Home Discharged Condition: good   Discharge Orders    Future Orders Please Complete By Expires   Diet Carb Modified      Diet - low sodium heart healthy      Call MD / Call 911      Comments:   If you experience chest pain or shortness of breath, CALL 911 and be transported to the hospital emergency room.  If you develope a fever above 101 F, pus (white drainage) or increased drainage or redness at the wound, or calf pain, call your surgeon's office.   Constipation Prevention      Comments:   Drink plenty of fluids.  Prune juice may be helpful.  You may use a stool softener, such as Colace (over the counter) 100 mg twice a day.  Use MiraLax (over the  counter) for constipation as needed.   Increase activity slowly as tolerated      Discharge instructions      Comments:   Walk with your walker. Weight bearing as instructed. Home Health Agency will follow you at home for your therapy  Change your dressing daily. Shower only, no tub bath. Call if any temperatures greater than 101 or any wound complications: [phone] during the day and ask for Dr. Jeannetta Ellis nurse, [facility].   Driving restrictions      Comments:   No driving   Change dressing      Comments:   Change dressing daily with sterile 4 x 4 inch gauze dressing   Do not put a pillow under the knee. Place it under the heel.      Call MD / Call 911      Comments:   If you experience chest pain or shortness of breath, CALL 911 and be transported to the hospital emergency room.  If you develope a fever above 101 F, pus (white drainage) or increased drainage or redness at the wound, or calf pain, call your surgeon's office.   Constipation Prevention      Comments:   Drink  plenty of fluids.  Prune juice may be helpful.  You may use a stool softener, such as Colace (over the counter) 100 mg twice a day.  Use MiraLax (over the counter) for constipation as needed.   Increase activity slowly as tolerated          Medication List     As of 04/17/2012 10:09 AM    STOP taking these medications         aspirin EC 81 MG tablet      B-complex with vitamin C tablet      calcium carbonate 500 MG chewable tablet   Commonly known as: TUMS - dosed in mg elemental calcium      calcium carbonate 600 MG Tabs   Commonly known as: OS-CAL      fish oil-omega-3 fatty acids 1000 MG capsule      indomethacin 25 MG capsule   Commonly known as: INDOCIN      traMADol-acetaminophen 37.5-325 MG per tablet   Commonly known as: ULTRACET      VAGIFEM 10 MCG Tabs   Generic drug: Estradiol      TAKE these medications         cetirizine 10 MG tablet   Commonly known as: ZYRTEC   Take 10 mg by mouth every evening.      clobetasol cream 0.05 %   Commonly known as: TEMOVATE   Apply 1 application topically 2 (two) times daily.      diazepam 5 MG tablet   Commonly known as: VALIUM   Take 5 mg by mouth as needed. Migraine headaches      estradiol 0.1 MG/GM vaginal cream   Commonly known as: ESTRACE   Place 1 g vaginally 4 (four) times a week. Tuesday Thursday Saturday and sunday      ferrous sulfate 325 (65 FE) MG tablet   Take 1 tablet (325 mg total) by mouth 3 (three) times daily after meals.      lisinopril 20 MG tablet   Commonly known as: PRINIVIL,ZESTRIL   Take 20 mg by mouth at bedtime.      metFORMIN 1000 MG tablet   Commonly known as: GLUCOPHAGE   Take 1 tablet (1,000 mg total) by mouth 2 (two) times daily with a  meal. 1 by mouth two times daily      methocarbamol 500 MG tablet   Commonly known as: ROBAXIN   Take 1 tablet (500 mg total) by mouth 3 (three) times daily as needed. Muscle spasms      oxyCODONE-acetaminophen 5-325 MG per tablet   Commonly  known as: PERCOCET/ROXICET   Take 1-2 tablets by mouth every 4 (four) hours as needed (Q4-6 hours PRN).      rivaroxaban 10 MG Tabs tablet   Commonly known as: XARELTO   Take 1 tablet (10 mg total) by mouth daily with breakfast.      sertraline 50 MG tablet   Commonly known as: ZOLOFT   Take 50 mg by mouth daily with breakfast.      simvastatin 40 MG tablet   Commonly known as: ZOCOR   Take 40 mg by mouth every evening.      sitaGLIPtin 100 MG tablet   Commonly known as: JANUVIA   Take 0.5 tablets (50 mg total) by mouth daily.         Signed: Xiara [name] LAUREN 04/17/2012, 10:09 AM

## 2012-04-18 NOTE — Progress Notes (Signed)
Discharge summary sent to payer through [facility]  

## 2012-06-28 ENCOUNTER — Telehealth: Payer: Self-pay | Admitting: Internal Medicine

## 2012-06-28 NOTE — Telephone Encounter (Signed)
Refill: Sertraline hcl 50 mg tab #90. Take one tablet by mouth every day. Last fill 03-24-12  Simvastatin 40mg  tab #90. Take one tablet by mouth every morning. Last fill 8.15.13

## 2012-06-29 MED ORDER — SIMVASTATIN 40 MG PO TABS: 40.0000 mg | ORAL_TABLET | Freq: Every evening | ORAL | Status: DC

## 2012-06-29 MED ORDER — SERTRALINE HCL 50 MG PO TABS: 50.0000 mg | ORAL_TABLET | Freq: Every day | ORAL | Status: DC

## 2012-06-29 NOTE — Telephone Encounter (Signed)
RXs sent.

## 2012-07-27 ENCOUNTER — Encounter: Payer: Self-pay | Admitting: Internal Medicine

## 2012-07-27 ENCOUNTER — Ambulatory Visit (INDEPENDENT_AMBULATORY_CARE_PROVIDER_SITE_OTHER): Payer: PRIVATE HEALTH INSURANCE | Admitting: Internal Medicine

## 2012-07-27 VITALS — BP 112/70 | HR 75 | Wt 188.0 lb

## 2012-07-27 DIAGNOSIS — E119 Type 2 diabetes mellitus without complications: Secondary | ICD-10-CM

## 2012-07-27 DIAGNOSIS — G250 Essential tremor: Secondary | ICD-10-CM

## 2012-07-27 DIAGNOSIS — E785 Hyperlipidemia, unspecified: Secondary | ICD-10-CM

## 2012-07-27 DIAGNOSIS — I10 Essential (primary) hypertension: Secondary | ICD-10-CM

## 2012-07-27 DIAGNOSIS — M069 Rheumatoid arthritis, unspecified: Secondary | ICD-10-CM

## 2012-07-27 DIAGNOSIS — G252 Other specified forms of tremor: Secondary | ICD-10-CM | POA: Insufficient documentation

## 2012-07-27 LAB — TSH: TSH: 1.29 u[IU]/mL (ref 0.35–5.50)

## 2012-07-27 LAB — HEPATIC FUNCTION PANEL
AST: 18 U/L (ref 0–37)
Albumin: 3.7 g/dL (ref 3.5–5.2)
Alkaline Phosphatase: 34 U/L — ABNORMAL LOW (ref 39–117)
Total Bilirubin: 0.5 mg/dL (ref 0.3–1.2)

## 2012-07-27 LAB — BASIC METABOLIC PANEL
CO2: 26 mEq/L (ref 19–32)
Chloride: 102 mEq/L (ref 96–112)
Glucose, Bld: 119 mg/dL — ABNORMAL HIGH (ref 70–99)
Sodium: 137 mEq/L (ref 135–145)

## 2012-07-27 LAB — LIPID PANEL
HDL: 51.2 mg/dL (ref >39.00)
Total CHOL/HDL Ratio: 4
VLDL: 27.8 mg/dL (ref 0.0–40.0)

## 2012-07-27 LAB — MICROALBUMIN / CREATININE URINE RATIO
Creatinine,U: 158.1 mg/dL
Microalb Creat Ratio: 0.9 mg/g (ref 0.0–30.0)

## 2012-07-27 LAB — HEMOGLOBIN A1C: Hgb A1c MFr Bld: 8.3 % — ABNORMAL HIGH (ref 4.6–6.5)

## 2012-07-27 MED ORDER — METFORMIN HCL 1000 MG PO TABS: 1000.0000 mg | ORAL_TABLET | Freq: Two times a day (BID) | ORAL | Status: DC

## 2012-07-27 MED ORDER — SIMVASTATIN 40 MG PO TABS: 40.0000 mg | ORAL_TABLET | Freq: Every evening | ORAL | Status: DC

## 2012-07-27 MED ORDER — DIAZEPAM 5 MG PO TABS
5.0000 mg | ORAL_TABLET | Freq: Two times a day (BID) | ORAL | Status: DC | PRN
Start: 2012-07-27 — End: 2014-04-11

## 2012-07-27 MED ORDER — LISINOPRIL 20 MG PO TABS: 20.0000 mg | ORAL_TABLET | Freq: Every day | ORAL | Status: DC

## 2012-07-27 MED ORDER — SERTRALINE HCL 50 MG PO TABS: 50.0000 mg | ORAL_TABLET | Freq: Every day | ORAL | Status: DC

## 2012-07-27 MED ORDER — SITAGLIPTIN PHOSPHATE 100 MG PO TABS: 50.0000 mg | ORAL_TABLET | Freq: Every day | ORAL | Status: DC

## 2012-07-27 NOTE — Assessment & Plan Note (Signed)
BMET 

## 2012-07-27 NOTE — Assessment & Plan Note (Signed)
Urine microalbumin and A1c. Problematic is steroid therapy for rheumatoid arthritis

## 2012-07-27 NOTE — Patient Instructions (Addendum)
If you activate My Chart; the results can be released to you as soon as they populate from the lab. If you choose not to use this program; the labs have to be reviewed, copied & mailed   causing a delay in getting the results to you.  Review and correct the record as indicated. Please share record with all medical staff seen.   To prevent tremors or premature beats, avoid stimulants such as decongestants, diet pills, nicotine, or caffeine (coffee, tea, cola, or chocolate) to excess.

## 2012-07-27 NOTE — Assessment & Plan Note (Signed)
Fasting hepatic panel & lipids will be checked; she has lost 30 pounds

## 2012-07-27 NOTE — Progress Notes (Signed)
Subjective:    Patient ID: OCTA UPLINGER, female    DOB: 11-29-1955, 57 y.o.   MRN: [id number]  HPI Diabetes status assessment: Fasting or morning glucose  average : 135  . Highest glucose 2 hours after any meal:  < 170. Hypoglycemia :  no .                                                                                                        Exercise :unable due to RA . Nutrition/diet: portion control . Medication compliance : yes. Medication adverse  Effects:  no . Eye exam : 8/13; no retinopathy. Foot care : no.  A1c/ urine microalbumin monitor: 9/13 7%      Review of Systems Excess thirst :yes;  Excess hunger:  no ;  Excess urination:  no.                                 Lightheadedness with standing:  no. Chest pain:  no ; Palpitations :no ;  Pain in  calves with walking:  no .                                                                                                                                 Non healing skin  ulcers or sores,especially over the feet:  no. Numbness or tingling or burning in feet : no .                                                                                                                                              Significant change in  Weight : down 30# due to anorexia from joint pain . Vision changes : no  .  Objective:   Physical Exam Gen.: thin but  well-nourished in appearance. Alert, appropriate and cooperative throughout exam.  Eyes: No corneal or conjunctival inflammation noted.  Neck: No deformities, masses, or tenderness noted. Range of motion & Thyroid normal. Lungs: Normal respiratory effort; chest expands symmetrically. Lungs are clear to auscultation without rales, wheezes, or increased work of breathing. Heart: Normal rate and rhythm. Normal S1 and S2. No gallop, click, or rub.S4 w/o murmur. Abdomen: Bowel sounds normal; abdomen soft and nontender. No masses, organomegaly or hernias noted.                       Musculoskeletal/extremities: Accentuated curvature of upper thoracic  spine.  No clubbing, cyanosis, edema, or significant extremity  deformity noted. Fusiform R knee; crepitus L knee .Tone & strength  normal.Joints normal . Nail health good. Able to lie down & sit up w/o help.  Vascular: Carotid, radial artery, dorsalis pedis and  posterior tibial pulses are full and equal. No bruits present. Neurologic: Alert and oriented x3. Deep tendon reflexes symmetrical and normal. Skin: Intact without suspicious lesions or rashes.Fine intention tremor of hands Lymph: No cervical, axillary lymphadenopathy present. Psych: Mood and affect are normal. Normally interactive                                                                                         Assessment & Plan:

## 2012-10-11 DIAGNOSIS — G250 Essential tremor: Secondary | ICD-10-CM | POA: Insufficient documentation

## 2012-10-25 ENCOUNTER — Encounter: Payer: Self-pay | Admitting: Lab

## 2012-10-26 ENCOUNTER — Ambulatory Visit (INDEPENDENT_AMBULATORY_CARE_PROVIDER_SITE_OTHER): Payer: PRIVATE HEALTH INSURANCE | Admitting: Internal Medicine

## 2012-10-26 VITALS — BP 112/80 | HR 66 | Temp 98.2°F | Wt 196.6 lb

## 2012-10-26 DIAGNOSIS — F411 Generalized anxiety disorder: Secondary | ICD-10-CM

## 2012-10-26 DIAGNOSIS — E785 Hyperlipidemia, unspecified: Secondary | ICD-10-CM

## 2012-10-26 DIAGNOSIS — I10 Essential (primary) hypertension: Secondary | ICD-10-CM

## 2012-10-26 DIAGNOSIS — IMO0001 Reserved for inherently not codable concepts without codable children: Secondary | ICD-10-CM

## 2012-10-26 LAB — MICROALBUMIN / CREATININE URINE RATIO
Creatinine,U: 99.2 mg/dL
Microalb, Ur: 0.5 mg/dL (ref 0.0–1.9)

## 2012-10-26 MED ORDER — SERTRALINE HCL 50 MG PO TABS: 50.0000 mg | ORAL_TABLET | Freq: Every day | ORAL | Status: DC

## 2012-10-26 MED ORDER — SITAGLIPTIN PHOSPHATE 100 MG PO TABS: 100.0000 mg | ORAL_TABLET | Freq: Every day | ORAL | Status: DC

## 2012-10-26 NOTE — Assessment & Plan Note (Signed)
She will be given the option of increasing the sertraline to 50 mg 1-1/2 pills daily as a trial.

## 2012-10-26 NOTE — Patient Instructions (Addendum)

## 2012-10-26 NOTE — Assessment & Plan Note (Signed)
Blood pressure control is excellent and there is no renal impairment. No change is indicated

## 2012-10-26 NOTE — Assessment & Plan Note (Signed)
A1c and urine microalbumin will be checked. If diabetes is uncontrolled, options include insulin,Victoza, or oral agents to increase glucose excretion through the kidneys. If significant doses of prednisone are continued; the best option would probably be insulin.

## 2012-10-26 NOTE — Progress Notes (Signed)
  Subjective:    Patient ID: Raven [name], female    DOB: 07-Sep-1955, 57 y.o.   MRN: [id number]  HPI The patient is here for followup of diabetes, hyperlipidemia, and hypertension.  The most recent A1c was 8.3, which correlates to an average sugar of 192, and long-term risk of 66% . Fasting blood sugar range average 125 .  Highest two-hour postprandial glucose is < 160. No hypoglycemia reported Last ophthalmologic examination   8/13 no retinopathy. No active podiatry assessment on record. Diet is : "see food" , stress eating . Exercise:  Unable due to RA.  The most recent lipids reveal LDL106 ; HDL 51.2 ; and triglycerides 139  . There is medical compliance with the statin. Some myalgias in legs  Blood pressure not checked  . There is medical compliance with ACE-I medications. No  medication adverse effects          Review of Systems Constitutional: Significant weight change of 23#;  excess fatigue . Night sweats Eyes: No  blurred vision;  double vision ; loss of vision Cardiovascular: no chest pain ;palpitations; racing; irregular rhythm ;syncope; claudication ; edema  Respiratory: No exertional dyspnea;  paroxysmal nocturnal dyspnea Genitourinary: No dysuria; hematuria ; pyuria; frequency;  Incontinence;  nocturia Musculoskeletal: No myalgias or muscle cramping  Dermatologic: No non healing  Lesions; change in color or temperature of skin Neurologic: No  limb weakness;  numbness or tingling;  Burning. On Primidone for tremor Endocrine: No change in hair, skin, nails. No excessive thirst; excessive hunger;  excessive urination Psych: major family stresses; mother in law     Objective:   Physical Exam Gen.: Well-nourished in appearance. Alert, appropriate and cooperative throughout exam. Eyes: No corneal or conjunctival inflammation noted.  Mouth: Oral mucosa and oropharynx reveal no lesions or exudates. Teeth in good repair. Neck: No deformities, masses, or  tenderness noted.  Thyroid normal. Lungs: Normal respiratory effort; chest expands symmetrically. Lungs are clear to auscultation without rales, wheezes, or increased work of breathing. Heart: Normal rate and rhythm. Normal S1 and S2. No gallop, click, or rub. S4 w/o murmur. Abdomen: Bowel sounds normal; abdomen soft and nontender. No masses, organomegaly or hernias noted. Musculoskeletal/extremities: There is some asymmetry of the posterior thoracic musculature suggesting occult scoliosis.No clubbing, cyanosis, edema, or significant extremity  deformity noted. Range of motion normal .Tone & strength  Normal. Joints normal . Nail health good. Vascular: Carotid, radial artery, dorsalis pedis and  posterior tibial pulses are full and equal. No bruits present. Neurologic: Alert and oriented x3. Deep tendon reflexes symmetrical and normal.  Light touch normal over feet. Gait normal  including heel & toe walking .        Skin: Intact without suspicious lesions or rashes. Lymph: No cervical, axillary lymphadenopathy present. Psych: Mood and affect are flat but normally interactive                                                                                       Assessment & Plan:

## 2012-11-09 ENCOUNTER — Encounter: Payer: Self-pay | Admitting: Internal Medicine

## 2013-02-15 ENCOUNTER — Encounter: Payer: Self-pay | Admitting: Internal Medicine

## 2013-02-15 ENCOUNTER — Ambulatory Visit (INDEPENDENT_AMBULATORY_CARE_PROVIDER_SITE_OTHER): Payer: PRIVATE HEALTH INSURANCE | Admitting: Internal Medicine

## 2013-02-15 VITALS — BP 122/78 | HR 74 | Wt 198.6 lb

## 2013-02-15 DIAGNOSIS — F411 Generalized anxiety disorder: Secondary | ICD-10-CM

## 2013-02-15 DIAGNOSIS — IMO0001 Reserved for inherently not codable concepts without codable children: Secondary | ICD-10-CM

## 2013-02-15 DIAGNOSIS — E785 Hyperlipidemia, unspecified: Secondary | ICD-10-CM

## 2013-02-15 DIAGNOSIS — I10 Essential (primary) hypertension: Secondary | ICD-10-CM

## 2013-02-15 DIAGNOSIS — M069 Rheumatoid arthritis, unspecified: Secondary | ICD-10-CM

## 2013-02-15 LAB — MICROALBUMIN / CREATININE URINE RATIO: Microalb Creat Ratio: 0.3 mg/g (ref 0.0–30.0)

## 2013-02-15 LAB — AST: AST: 17 U/L (ref 0–37)

## 2013-02-15 LAB — BASIC METABOLIC PANEL
Chloride: 102 mEq/L (ref 96–112)
GFR: 77.29 mL/min (ref >60.00)
Potassium: 4.2 mEq/L (ref 3.5–5.1)
Sodium: 138 mEq/L (ref 135–145)

## 2013-02-15 LAB — ALT: ALT: 26 U/L (ref 0–35)

## 2013-02-15 LAB — LIPID PANEL
Cholesterol: 162 mg/dL (ref 0–200)
Triglycerides: 89 mg/dL (ref 0.0–149.0)

## 2013-02-15 LAB — HEMOGLOBIN A1C: Hgb A1c MFr Bld: 7.2 % — ABNORMAL HIGH (ref 4.6–6.5)

## 2013-02-15 MED ORDER — METFORMIN HCL 1000 MG PO TABS: 1000.0000 mg | ORAL_TABLET | Freq: Two times a day (BID) | ORAL | Status: DC

## 2013-02-15 MED ORDER — SERTRALINE HCL 50 MG PO TABS: 50.0000 mg | ORAL_TABLET | Freq: Every day | ORAL | Status: DC

## 2013-02-15 MED ORDER — SITAGLIPTIN PHOSPHATE 100 MG PO TABS: 100.0000 mg | ORAL_TABLET | Freq: Every day | ORAL | Status: DC

## 2013-02-15 MED ORDER — SIMVASTATIN 40 MG PO TABS: 40.0000 mg | ORAL_TABLET | Freq: Every evening | ORAL | Status: DC

## 2013-02-15 NOTE — Progress Notes (Signed)
  Subjective:    Patient ID: Raven [name], female    DOB: 1956/02/09, 57 y.o.   MRN: [id number]  HPI  She returns for refills of her ACE inhibitor, metformin,  Januvia, simvastatin and sertraline.  Average fasting blood sugars are 115 and 2 hours after a meal 139. She has been experiencing nausea in the mid afternoon for 10 days. She also describes excessive thirst and excessive urination.  Her last A1c was 7.2% in April; this had dropped from 8.3% in January. In January lipids were good with an LDL 106 and HDL of 51.2. TSH was therapeutic at that time.  Sertraline is of significant benefit; she laughs as she states "I'm not crying all the time".    Review of Systems  She denies dysphagia, significant dyspepsia, abdominal pain, bowel changes, melena, rectal bleeding  She has no blurred vision, double vision, or loss of vision.  She denies any nonhealing lesions. She also has no numbness, tingling, burning in her extremities  She does have significant pain in her feet, hips, right upper extremity, and lumbosacral area despite taking methotrexate weekly and prednisone 10 mg daily for rheumatoid arthritis.      Objective:   Physical Exam Gen.:  well-nourished in appearance. Alert, appropriate and cooperative throughout exam.Appears younger than stated age  Head: Normocephalic without obvious abnormalities  Eyes: No corneal or conjunctival inflammation noted. Hearing is grossly normal bilaterally. Nose: External nasal exam reveals no deformity or inflammation. Nasal mucosa are pink and moist. No lesions or exudates noted.  Mouth: Oral mucosa and oropharynx reveal no lesions or exudates. Teeth in good repair. Neck: No deformities, masses, or tenderness noted. Range of motion surprisingly good. Thyroid normal. Lungs: Normal respiratory effort; chest expands symmetrically. Lungs are clear to auscultation without rales, wheezes, or increased work of breathing. Heart: Normal rate and  rhythm. Normal S1 and S2. No gallop, click, or rub. S4 w/o murmur. Abdomen: Bowel sounds normal; abdomen soft and nontender. No masses, organomegaly or hernias noted.                               Musculoskeletal/extremities: Minimally accentuated curvature of upper thoracic  Spine. No clubbing, cyanosis, edema, or significant extremity  deformity noted. Range of motion decreased R > L knee .Tone & strength  Normal. Joints normal . Nail health good. Able to lie down & sit up w/o help. Negative SLR bilaterally Vascular: Carotid, radial artery, dorsalis pedis and  posterior tibial pulses are full and equal. No bruits present. Neurologic: Alert and oriented x3. Deep tendon reflexes symmetrical and normal.        Skin: Intact without suspicious lesions or rashes. Lymph: No cervical, axillary lymphadenopathy present. Psych: Mood and affect are normal. Normally interactive                                                                                        Assessment & Plan:  See Current Assessment & Plan in Problem List under specific Diagnosis

## 2013-02-15 NOTE — Assessment & Plan Note (Signed)
She will discuss the severity of her symptoms with her rheumatologist. She's a candidate for another disease altering therapeutic program

## 2013-02-15 NOTE — Assessment & Plan Note (Signed)
A1c and urine microalbumin 

## 2013-02-15 NOTE — Assessment & Plan Note (Signed)
Renew Sertraline; consideration could be given to generic Cymbalta which may also help some of her pain symptoms

## 2013-02-15 NOTE — Assessment & Plan Note (Signed)
BMET 

## 2013-02-15 NOTE — Patient Instructions (Addendum)
Share results with all non  medical staff seen  

## 2013-02-15 NOTE — Assessment & Plan Note (Signed)
Fasting lipids, AST, and ALT will be checked

## 2013-02-17 ENCOUNTER — Encounter: Payer: Self-pay | Admitting: Internal Medicine

## 2013-02-19 ENCOUNTER — Encounter: Payer: Self-pay | Admitting: Internal Medicine

## 2013-02-23 ENCOUNTER — Other Ambulatory Visit: Payer: Self-pay

## 2013-02-23 MED ORDER — GLUCOSE BLOOD VI STRP: ORAL_STRIP | Status: DC

## 2013-02-23 MED ORDER — ACCU-CHEK AVIVA DEVI: Status: DC

## 2013-02-27 ENCOUNTER — Encounter: Payer: Self-pay | Admitting: Internal Medicine

## 2013-02-28 ENCOUNTER — Encounter: Payer: Self-pay | Admitting: Internal Medicine

## 2013-02-28 MED ORDER — GLUCOSE BLOOD VI STRP: ORAL_STRIP | Status: DC

## 2013-02-28 MED ORDER — ONETOUCH DELICA LANCETS FINE MISC: 1.0000 | Freq: Every day | Status: DC

## 2013-05-17 ENCOUNTER — Other Ambulatory Visit: Payer: Self-pay

## 2013-06-29 ENCOUNTER — Encounter: Payer: Self-pay | Admitting: Internal Medicine

## 2013-06-29 ENCOUNTER — Ambulatory Visit (INDEPENDENT_AMBULATORY_CARE_PROVIDER_SITE_OTHER): Payer: PRIVATE HEALTH INSURANCE | Admitting: Internal Medicine

## 2013-06-29 VITALS — BP 118/69 | HR 71 | Temp 98.4°F | Wt 208.8 lb

## 2013-06-29 DIAGNOSIS — I10 Essential (primary) hypertension: Secondary | ICD-10-CM

## 2013-06-29 DIAGNOSIS — K219 Gastro-esophageal reflux disease without esophagitis: Secondary | ICD-10-CM

## 2013-06-29 DIAGNOSIS — IMO0001 Reserved for inherently not codable concepts without codable children: Secondary | ICD-10-CM

## 2013-06-29 DIAGNOSIS — E785 Hyperlipidemia, unspecified: Secondary | ICD-10-CM

## 2013-06-29 LAB — BASIC METABOLIC PANEL
Calcium: 8.6 mg/dL (ref 8.4–10.5)
Chloride: 106 mEq/L (ref 96–112)
GFR: 80.62 mL/min (ref >60.00)
Glucose, Bld: 120 mg/dL — ABNORMAL HIGH (ref 70–99)

## 2013-06-29 LAB — CBC WITH DIFFERENTIAL/PLATELET
Basophils Absolute: 0.1 10*3/uL (ref 0.0–0.1)
Eosinophils Relative: 2.3 % (ref 0.0–5.0)
Lymphocytes Relative: 40.2 % (ref 12.0–46.0)
Monocytes Absolute: 0.7 10*3/uL (ref 0.1–1.0)
Monocytes Relative: 8.6 % (ref 3.0–12.0)
Neutrophils Relative %: 48.2 % (ref 43.0–77.0)
Platelets: 244 10*3/uL (ref 150.0–400.0)
RDW: 17.3 % — ABNORMAL HIGH (ref 11.5–14.6)
WBC: 8.4 10*3/uL (ref 4.5–10.5)

## 2013-06-29 LAB — LIPID PANEL
LDL Cholesterol: 105 mg/dL — ABNORMAL HIGH (ref 0–99)
Total CHOL/HDL Ratio: 4

## 2013-06-29 LAB — HEPATIC FUNCTION PANEL
ALT: 30 U/L (ref 0–35)
AST: 22 U/L (ref 0–37)
Alkaline Phosphatase: 39 U/L (ref 39–117)
Bilirubin, Direct: 0 mg/dL (ref 0.0–0.3)
Total Bilirubin: 0.4 mg/dL (ref 0.3–1.2)
Total Protein: 6.7 g/dL (ref 6.0–8.3)

## 2013-06-29 LAB — HEMOGLOBIN A1C: Hgb A1c MFr Bld: 7.6 % — ABNORMAL HIGH (ref 4.6–6.5)

## 2013-06-29 LAB — MICROALBUMIN / CREATININE URINE RATIO
Microalb Creat Ratio: 0.2 mg/g (ref 0.0–30.0)
Microalb, Ur: 0.2 mg/dL (ref 0.0–1.9)

## 2013-06-29 MED ORDER — METFORMIN HCL 1000 MG PO TABS: 1000.0000 mg | ORAL_TABLET | Freq: Two times a day (BID) | ORAL | Status: DC

## 2013-06-29 MED ORDER — LISINOPRIL 20 MG PO TABS: 20.0000 mg | ORAL_TABLET | Freq: Every day | ORAL | Status: DC

## 2013-06-29 MED ORDER — SIMVASTATIN 40 MG PO TABS: 40.0000 mg | ORAL_TABLET | Freq: Every evening | ORAL | Status: DC

## 2013-06-29 NOTE — Assessment & Plan Note (Signed)
A1c, microalbumin Nutrition & CVE recommended

## 2013-06-29 NOTE — Progress Notes (Signed)
Subjective:    Patient ID: Raven [name], female    DOB: Mar 30, 1956, 57 y.o.   MRN: [id number]  HPI  The patient is here for followup of diabetes, hyperlipidemia, and hypertension.  The most recent A1c was 7.2% in 8/14  , which correlates to an average sugar of 180 , and long-term risk of 44%  . Fasting blood sugar  average 126.  Highest two-hour postprandial glucose is <  170 . High 248 late  in evening while fasting. No hypoglycemia reported Last ophthalmologic examination > 12 mos  revealed no retinopathy. No active podiatry assessment on record. Diet is "portion control", not low carb sugar. No exercise  .  The most recent lipids 02/15/13   reveal LDL 94 ; HDL 50.6 ; and triglycerides  89  . There is medical compliance with the statin.  Blood pressure  Average  120/76  . There is medical compliance with antihypertensive medications  No  medication adverse effects suggested.        Review of Systems  Constitutional: No  significant weight change;  excess fatigue Eyes: No  blurred vision;  double vision ; loss of vision Cardiovascular: no chest pain ;palpitations; racing; irregular rhythm ;syncope; claudication ; edema  Respiratory: No exertional dyspnea;  paroxysmal nocturnal dyspnea Genitourinary: No dysuria; hematuria ; pyuria; frequency; incontinence;  nocturia Musculoskeletal: No myalgias or muscle cramping  Dermatologic: No non healing  Lesions; change in color or temperature of skin GI: significant dyspepsia, some dysphagia. No melena , rectal bleeding Neurologic: No  limb weakness;  numbness or tingling;  burning Endocrine: No change in hair, skin, nails. No excessive thirst; excessive hunger;  excessive urination        Objective:   Physical Exam Gen.: well-nourished in appearance. Alert, appropriate and cooperative throughout exam.Appears younger than stated age  Head: Normocephalic without obvious abnormalities Eyes: No corneal or conjunctival  inflammation noted. Pupils equal round reactive to light and accommodation. Extraocular motion intact.  Nose: External nasal exam reveals no deformity or inflammation. Nasal mucosa are pink and moist. No lesions or exudates noted.  Mouth: Oral mucosa and oropharynx reveal no lesions or exudates. Teeth in good repair. Neck: No deformities, masses, or tenderness noted. Range of motion decreased. Thyroid normal. Lungs: Normal respiratory effort; chest expands symmetrically. Lungs are clear to auscultation without rales, wheezes, or increased work of breathing. Heart: Normal rate and rhythm. Normal S1 and S2. No gallop, click, or rub.S4 w/o murmur. Abdomen: Bowel sounds normal; abdomen soft and nontender. No masses, organomegaly or hernias noted. Genitalia:  as per Gyn                                  Musculoskeletal/extremities:  Accentuated curvature of upper thoracic spine. No clubbing, cyanosis, edema, or significant extremity  deformity noted. Range of motion decreased R knee .Tone & strength normal. Hand joints normal OR reveal mild  DJD DIP changes. Fingernail / toenail health good. Able to lie down & sit up w/o help. Negative SLR bilaterally Vascular: Carotid, radial artery, dorsalis pedis and  posterior tibial pulses are full and equal. No bruits present. Neurologic: Alert and oriented x3. Deep tendon reflexes decreased R knee.       Skin: Intact without suspicious lesions or rashes. Lymph: No cervical, axillary lymphadenopathy present. Psych: Mood and affect are normal. Normally interactive  Assessment & Plan:  See Current Assessment & Plan in Problem List under specific Diagnosis

## 2013-06-29 NOTE — Progress Notes (Signed)
Pre visit review using our clinic review tool, if applicable. No additional management support is needed unless otherwise documented below in the visit note. 

## 2013-06-29 NOTE — Assessment & Plan Note (Signed)
Controlled  BMET 

## 2013-06-29 NOTE — Assessment & Plan Note (Signed)
Lipids, LFTs 

## 2013-06-29 NOTE — Assessment & Plan Note (Signed)
Asked to see Dr Jeralene Peters

## 2013-06-29 NOTE — Patient Instructions (Signed)
Your next office appointment will be determined based upon review of your pending labs . Those instructions will be transmitted to you through My Chart   Reflux of gastric acid may be asymptomatic as this may occur mainly during sleep.The triggers for reflux  include stress; the "aspirin family" ; alcohol; peppermint; and caffeine (coffee, tea, cola, and chocolate). The aspirin family would include aspirin and the nonsteroidal agents such as ibuprofen &  Naproxen. Tylenol would not cause reflux. If having symptoms ; food & drink should be avoided for @ least 2 hours before going to bed.  

## 2013-07-01 ENCOUNTER — Other Ambulatory Visit: Payer: Self-pay | Admitting: Internal Medicine

## 2013-07-01 DIAGNOSIS — IMO0001 Reserved for inherently not codable concepts without codable children: Secondary | ICD-10-CM

## 2013-08-21 ENCOUNTER — Ambulatory Visit (INDEPENDENT_AMBULATORY_CARE_PROVIDER_SITE_OTHER): Payer: PRIVATE HEALTH INSURANCE | Admitting: Internal Medicine

## 2013-08-21 VITALS — BP 112/74 | HR 80 | Temp 98.0°F

## 2013-08-21 DIAGNOSIS — J310 Chronic rhinitis: Secondary | ICD-10-CM

## 2013-08-21 MED ORDER — AZITHROMYCIN 250 MG PO TABS: ORAL_TABLET | ORAL | Status: DC

## 2013-08-21 NOTE — Patient Instructions (Signed)
Plain Mucinex (NOT D) for thick secretions ;force NON dairy fluids .   Nasal cleansing in the shower as discussed with lather of mild shampoo.After 10 seconds wash off lather while  exhaling through nostrils. Make sure that all residual soap is removed to prevent irritation.  Fluticasone 1 spray in each nostril twice a day as needed. Use the "crossover" technique into opposite nostril spraying toward opposite ear @ 45 degree angle, not straight up into nostril.  Use a Neti pot daily only  as needed for significant sinus congestion; going from open side to congested side . Plain Allegra (NOT D )  160 daily , Loratidine 10 mg , OR Zyrtec 10 mg @ bedtime  as needed for itchy eyes & sneezing. Fill the  prescription for Zithromax it there is not dramatic improvement in the next 48- 72 hours.

## 2013-08-21 NOTE — Progress Notes (Signed)
Pre-visit discussion using our clinic review tool. No additional management support is needed unless otherwise documented below in the visit note.  

## 2013-08-21 NOTE — Progress Notes (Signed)
   Subjective:    Patient ID: Raven [name], female    DOB: [birth year], 58 y.o.   MRN: [id number]  HPI   Symptoms began 2/7 as rhinitis with clear secretions. This was followed by a cough. She also had a fever; she did not actually check with a thermometer. This was associated with chills and sweats  Tylenol was of some benefit for the symptoms. She also use Tessalon Perles for cough  With cough she has right-sided chest pain but no pain with deep inspiration.  She's noted some wheezing. She has no history of asthma  She does have some frontal sinus discomfort & L > R ear pain with "amber discharge"  She's never smoked.  She is not taking a PPI at this time. She is on ACE inhibitor.    Review of Systems  She has not visualized frank nasal purulence; she does not have facial pain, dental pain, or sore throat..  The cough is not associated with shortness of breath.     Objective:   Physical Exam General appearance:good health ;well nourished; no acute distress or increased work of breathing is present.  No  lymphadenopathy about the head, neck, or axilla noted. Nasal congestion noted on assessment.   Eyes: No conjunctival inflammation or lid edema is present. There is no scleral icterus.  Ears:  External ear exam shows no significant lesions or deformities.  Otoscopic examination reveals clear canals, tympanic membranes are intact bilaterally without bulging, retraction, inflammation or discharge.  Nose:  External nasal examination shows no deformity or inflammation. Nasal mucosa are pink and moist without lesions or exudates. No septal dislocation or deviation.No obstruction to airflow.   Oral exam: Dental hygiene is good; lips and gums are healthy appearing.There is no oropharyngeal erythema or exudate noted.   Neck:  No deformities, thyromegaly, masses, or tenderness noted.   Supple with full range of motion without pain.   Heart:  Normal rate and regular rhythm. S1 and  S2 normal without gallop, murmur, click, rub or other extra sounds.   Lungs:Chest clear to auscultation; no wheezes, rhonchi,rales ,or rubs present.No increased work of breathing. Dry cough  Extremities:  No cyanosis, edema, or clubbing  noted    Skin: Warm & dry w/o jaundice or tenting.         Assessment & Plan:  #1 viral rhinitis #2 see orders

## 2013-08-29 ENCOUNTER — Telehealth: Payer: Self-pay | Admitting: General Practice

## 2013-08-29 NOTE — Telephone Encounter (Signed)
Pt called stating that she had seen Hopp on 08/21/13. States that she has finished her z-pak and is not feeling any better. States that her symptoms still remain persistent. And now has severe indigestion. Please advise.

## 2013-08-29 NOTE — Telephone Encounter (Signed)
   Hold metformin until nausea resolves. Use ranitidine 150 mg (two OTC 75 mg pills ) before breakfast and evening meal. The triggers for reflux  include stress; the "aspirin family" ; alcohol; peppermint or mental; and caffeine (coffee, tea, cola, and chocolate). The aspirin family would include aspirin and the nonsteroidal agents such as ibuprofen &  Naproxen. Tylenol would not cause reflux. If having symptoms ; food & drink should be avoided for @ least 2 hours before going to bed.   Please see me if you're having fever, significant sinus pain, or purulent secretions.

## 2013-08-30 NOTE — Telephone Encounter (Signed)
Phoned and left detailed message with MD's response & recommendations on voicemail.

## 2013-09-07 ENCOUNTER — Other Ambulatory Visit: Payer: Self-pay | Admitting: Internal Medicine

## 2013-09-11 NOTE — Telephone Encounter (Signed)
Rx was denied pt still had refills.//AB/CMA

## 2013-09-12 HISTORY — PX: COLONOSCOPY: SHX174

## 2013-09-12 LAB — HM COLONOSCOPY

## 2013-09-22 ENCOUNTER — Other Ambulatory Visit: Payer: Self-pay | Admitting: Internal Medicine

## 2013-11-23 ENCOUNTER — Ambulatory Visit (INDEPENDENT_AMBULATORY_CARE_PROVIDER_SITE_OTHER): Payer: PRIVATE HEALTH INSURANCE | Admitting: Internal Medicine

## 2013-11-23 ENCOUNTER — Encounter: Payer: Self-pay | Admitting: Internal Medicine

## 2013-11-23 ENCOUNTER — Other Ambulatory Visit (INDEPENDENT_AMBULATORY_CARE_PROVIDER_SITE_OTHER): Payer: PRIVATE HEALTH INSURANCE

## 2013-11-23 VITALS — BP 140/90 | HR 80 | Temp 98.4°F | Resp 14 | Wt 206.4 lb

## 2013-11-23 DIAGNOSIS — IMO0001 Reserved for inherently not codable concepts without codable children: Secondary | ICD-10-CM

## 2013-11-23 DIAGNOSIS — E1165 Type 2 diabetes mellitus with hyperglycemia: Principal | ICD-10-CM

## 2013-11-23 DIAGNOSIS — R1013 Epigastric pain: Secondary | ICD-10-CM

## 2013-11-23 DIAGNOSIS — R112 Nausea with vomiting, unspecified: Secondary | ICD-10-CM

## 2013-11-23 DIAGNOSIS — M069 Rheumatoid arthritis, unspecified: Secondary | ICD-10-CM

## 2013-11-23 LAB — CBC WITH DIFFERENTIAL/PLATELET
BASOS PCT: 0.4 % (ref 0.0–3.0)
Basophils Absolute: 0 10*3/uL (ref 0.0–0.1)
EOS PCT: 0.7 % (ref 0.0–5.0)
Eosinophils Absolute: 0.1 10*3/uL (ref 0.0–0.7)
HCT: 39.9 % (ref 36.0–46.0)
Hemoglobin: 13.1 g/dL (ref 12.0–15.0)
LYMPHS PCT: 23.3 % (ref 12.0–46.0)
Lymphs Abs: 2.5 10*3/uL (ref 0.7–4.0)
MCHC: 32.8 g/dL (ref 30.0–36.0)
MCV: 86.8 fl (ref 78.0–100.0)
MONO ABS: 0.6 10*3/uL (ref 0.1–1.0)
Monocytes Relative: 5.3 % (ref 3.0–12.0)
NEUTROS PCT: 70.3 % (ref 43.0–77.0)
Neutro Abs: 7.5 10*3/uL (ref 1.4–7.7)
PLATELETS: 250 10*3/uL (ref 150.0–400.0)
RBC: 4.6 Mil/uL (ref 3.87–5.11)
RDW: 16.8 % — ABNORMAL HIGH (ref 11.5–15.5)
WBC: 10.6 10*3/uL — AB (ref 4.0–10.5)

## 2013-11-23 LAB — HEMOGLOBIN A1C: Hgb A1c MFr Bld: 9.3 % — ABNORMAL HIGH (ref 4.6–6.5)

## 2013-11-23 LAB — HEPATIC FUNCTION PANEL
ALK PHOS: 39 U/L (ref 39–117)
ALT: 81 U/L — AB (ref 0–35)
AST: 40 U/L — ABNORMAL HIGH (ref 0–37)
Albumin: 3.8 g/dL (ref 3.5–5.2)
BILIRUBIN DIRECT: 0.2 mg/dL (ref 0.0–0.3)
BILIRUBIN TOTAL: 0.8 mg/dL (ref 0.2–1.2)
TOTAL PROTEIN: 6.8 g/dL (ref 6.0–8.3)

## 2013-11-23 LAB — MICROALBUMIN / CREATININE URINE RATIO
Creatinine,U: 158.3 mg/dL
MICROALB UR: 1.5 mg/dL (ref 0.0–1.9)
MICROALB/CREAT RATIO: 0.9 mg/g (ref 0.0–30.0)

## 2013-11-23 LAB — BASIC METABOLIC PANEL
BUN: 13 mg/dL (ref 6–23)
CALCIUM: 8.6 mg/dL (ref 8.4–10.5)
CO2: 26 meq/L (ref 19–32)
Chloride: 100 mEq/L (ref 96–112)
Creatinine, Ser: 0.7 mg/dL (ref 0.4–1.2)
GFR: 89.74 mL/min (ref >60.00)
Glucose, Bld: 190 mg/dL — ABNORMAL HIGH (ref 70–99)
POTASSIUM: 5 meq/L (ref 3.5–5.1)
SODIUM: 136 meq/L (ref 135–145)

## 2013-11-23 LAB — AMYLASE: AMYLASE: 33 U/L (ref 27–131)

## 2013-11-23 LAB — LIPASE: LIPASE: 38 U/L (ref 11.0–59.0)

## 2013-11-23 MED ORDER — INSULIN DETEMIR 100 UNIT/ML FLEXPEN: 12.0000 [IU] | PEN_INJECTOR | Freq: Every day | SUBCUTANEOUS | Status: DC

## 2013-11-23 NOTE — Patient Instructions (Signed)
Your next office appointment will be determined based upon review of your pending labs. Those instructions will be transmitted to you through My Chart . 

## 2013-11-23 NOTE — Progress Notes (Signed)
Pre visit review using our clinic review tool, if applicable. No additional management support is needed unless otherwise documented below in the visit note. 

## 2013-11-23 NOTE — Progress Notes (Signed)
Subjective:    Patient ID: Raven [name], female    DOB: 1956-03-24, 58 y.o.   MRN: [id number]  HPI  Diabetes status assessment: Fasting or morning glucose range : 250-300 Highest glucose 2 hours after any meal is <250. No hypoglycemia reported                                                                                                                 No regular exercise due to RA. Low carb & portion control diet followed Medication compliance is good. No medication adverse effects noted. Eye exam not current. Foot care not current    Excess thirst & excess hunger .No excess urination reported                             Occasional lightheadedness with standing reported  Occasional palpitations even @ rest                                                            No numbness or tingling or burning in feet described but sensation of " spiders crawling over legs".                                                                                                                                            Unintentional 5 # loss in past 2 weeks. Some intermittent burning upper abdominal pain despite PPI daily.Nausea & vomiting X 2. LFTs normal 3 weeks ago. RA very active despite Prednisone 10 mg daily. Remicade infusion planned.  Review of Systems                                                                 No non healing skin  ulcers or sores of extremities noted.                           No blurred vision ,  diplopia, or vision loss.                                   No chest pain ;  claudication described .  She denies significant dyspepsia, dysphagia, melena, rectal  bleeding, or persistently small caliber stools.  She denies dysuria, pyuria, hematuria, frequency, nocturia or polyuria.     Objective:   Physical Exam Gen.:  well-nourished in appearance. Weight excess. Alert, appropriate and cooperative throughout exam. Appears younger than stated age  Head: Normocephalic  without obvious abnormalities Eyes: No corneal or conjunctival inflammation noted. Pupils equal round reactive to light and accommodation. Ears: External  ear exam reveals no significant lesions or deformities.  Hearing is grossly normal bilaterally. Nose: External nasal exam reveals no deformity or inflammation. Nasal mucosa are pink and moist. No lesions or exudates noted.   Mouth: Oral mucosa and oropharynx reveal no lesions or exudates. Teeth in good repair. Neck: No deformities, masses, or tenderness noted.  Thyroid normal. Lungs: Normal respiratory effort; chest expands symmetrically. Lungs are clear to auscultation without rales, wheezes, or increased work of breathing. Heart: Normal rate and rhythm. Normal S1 and S2. No gallop, click, or rub. No murmur. Abdomen: Bowel sounds normal; abdomen soft and nontender. No masses, organomegaly or hernias noted.                                 Musculoskeletal/extremities: Slightly accentuated curvature of upper thoracic spine. No clubbing, cyanosis, edema, or significant extremity  deformity noted. Range of motion normal .Tone & strength normal. Hand joints normal !  Fingernail  health good. Crepitus R knee. Able to lie down & sit up w/o help. Negative SLR bilaterally Vascular: Carotid, radial artery, dorsalis pedis and  posterior tibial pulses are full and equal. No bruits present. Neurologic: Alert and oriented x3. Deep tendon reflexes symmetrical and normal.  Gait normal  Skin: Intact without suspicious lesions or rashes. Lymph: No cervical, axillary lymphadenopathy present. Psych: Mood and affect are normal. Normally interactive                                                                                        Assessment & Plan:  #1 DM #2 abdominal pain, N&V  See orders

## 2013-11-24 ENCOUNTER — Other Ambulatory Visit: Payer: Self-pay | Admitting: Internal Medicine

## 2013-11-24 DIAGNOSIS — E1165 Type 2 diabetes mellitus with hyperglycemia: Principal | ICD-10-CM

## 2013-11-24 DIAGNOSIS — IMO0001 Reserved for inherently not codable concepts without codable children: Secondary | ICD-10-CM

## 2013-12-18 ENCOUNTER — Ambulatory Visit (INDEPENDENT_AMBULATORY_CARE_PROVIDER_SITE_OTHER): Payer: PRIVATE HEALTH INSURANCE | Admitting: Internal Medicine

## 2013-12-18 ENCOUNTER — Encounter: Payer: Self-pay | Admitting: Internal Medicine

## 2013-12-18 VITALS — BP 112/70 | HR 88 | Temp 98.2°F | Ht 68.0 in | Wt 212.0 lb

## 2013-12-18 DIAGNOSIS — J209 Acute bronchitis, unspecified: Secondary | ICD-10-CM | POA: Insufficient documentation

## 2013-12-18 MED ORDER — PROMETHAZINE-CODEINE 6.25-10 MG/5ML PO SYRP: 5.0000 mL | ORAL_SOLUTION | ORAL | Status: DC | PRN

## 2013-12-18 MED ORDER — AZITHROMYCIN 250 MG PO TABS: ORAL_TABLET | ORAL | Status: DC

## 2013-12-18 NOTE — Patient Instructions (Signed)
Please take all new medication as prescribed  Please continue all other medications as before, and refills have been done if requested.  Please have the pharmacy call with any other refills you may need.  Please keep your appointments with your specialists as you may have planned     

## 2013-12-18 NOTE — Progress Notes (Signed)
Pre visit review using our clinic review tool, if applicable. No additional management support is needed unless otherwise documented below in the visit note. 

## 2013-12-18 NOTE — Progress Notes (Signed)
Subjective:    Patient ID: Raven [name], female    DOB: 07/27/1955, 58 y.o.   MRN: [id number]  HPI Here with acute onset mild to mod 2-3 days ST, HA, general weakness and malaise, with prod cough greenish sputum, but Pt denies chest pain, increased sob or doe, wheezing, orthopnea, PND, increased LE swelling, palpitations, dizziness or syncope.  Has some lost voice.  Pt denies polydipsia, polyuria, or low sugar symptoms such as weakness or confusion improved with po intake.  Pt states overall good compliance with meds, trying to follow lower cholesterol, diabetic diet, wt overall stable but little exercise however.    Past Medical History  Diagnosis Date  . Hyperlipidemia   . Migraine   . Hypertension   . Arthritis knees, hands, jaw  . Acute medial meniscus tear of right knee   . GERD (gastroesophageal reflux disease) per pt watches diet and take tums as needed  . Diabetes mellitus oral meds  . Anxiety   . Seasonal allergies   . Interstitial cystitis   . H/O hiatal hernia   . Lichen sclerosus of female genitalia    Past Surgical History  Procedure Laterality Date  . Tubal ligation  1981  . Breast biopsy  2001    fibrocystic breast disease  . Abdominal hysterectomy  1991    for fibroids  . Cystoscopy  2004    for chronic UTI  . Knee arthroscopy  10/21/2011    Procedure: ARTHROSCOPY KNEE;  Surgeon: Jacki Cones, MD;  Location: [facility];  Service: Orthopedics;  Laterality: Right;  WITH MEDIAL and lateral shaving of femoral chondyl  with microfracture technique of lateral and medial femoral chondyl suprapatellar synovectomy  . Intraarticular steroid injection  2013    3 R knee & L X 1; Dr Netta Corrigan  . Knee arthrocentesis  2013    R knee x 3 & L X 1  . Total knee arthroplasty  04/12/2012    Procedure: TOTAL KNEE ARTHROPLASTY;  Surgeon: Jacki Cones, MD;  Location: [facility];  Service: Orthopedics;  Laterality: Right;  Right Total Knee Arthroplasty    reports that she has never smoked. She has never used smokeless tobacco. She reports that she does not drink alcohol or use illicit drugs. family history includes Anxiety disorder in her son; Arthritis in her maternal grandmother; Bipolar disorder in her daughter; Diabetes in her mother; Heart disease in her father; Lung cancer in her father; Osteoarthritis in her mother; Pancreatic cancer in her father and paternal grandfather; Prostate cancer in her father; Stroke (age of onset: 4) in her mother; Stroke (age of onset: 68) in her brother. Allergies  Allergen Reactions  . Penicillins Rash  . Hydrocodone-Acetaminophen Nausea And Vomiting   Current Outpatient Prescriptions on File Prior to Visit  Medication Sig Dispense Refill  . cetirizine (ZYRTEC) 10 MG tablet Take 10 mg by mouth every evening.       . clobetasol cream (TEMOVATE) 0.05 % Apply 1 application topically 2 (two) times daily as needed.       . diazepam (VALIUM) 5 MG tablet Take 1 tablet (5 mg total) by mouth every 12 (twelve) hours as needed. Migraine headaches  30 tablet  1  . FOLIC ACID PO Take 3 mg by mouth daily.      Marland Kitchen glucose blood (ONE TOUCH ULTRA TEST) test strip Check blood sugar daily as directed.  Dx:250.02  100 each  1  . Insulin Detemir (LEVEMIR) 100 UNIT/ML  Pen Inject 12 Units into the skin daily at 10 pm.  15 mL  11  . lisinopril (PRINIVIL,ZESTRIL) 20 MG tablet Take 1 tablet (20 mg total) by mouth at bedtime.  90 tablet  1  . metFORMIN (GLUCOPHAGE) 1000 MG tablet Take 1 tablet (1,000 mg total) by mouth 2 (two) times daily with a meal.  180 tablet  1  . methotrexate 2.5 MG tablet Take 2.5 mg by mouth. 10 by mouth on Monday      . Omega-3 Fatty Acids (FISH OIL) 1000 MG CAPS Take by mouth 2 (two) times daily.      Letta Pate DELICA LANCETS FINE MISC 1 each by Does not apply route daily. Dx:250.02  100 each  1  . predniSONE (DELTASONE) 5 MG tablet Take 10 mg by mouth daily with breakfast.       . primidone (MYSOLINE) 50 MG  tablet Take 50 mg by mouth. 2-3 by mouth at bedtime      . sertraline (ZOLOFT) 50 MG tablet Take 50 mg by mouth daily with breakfast.      . simvastatin (ZOCOR) 40 MG tablet Take 1 tablet (40 mg total) by mouth every evening.  90 tablet  1  . sitaGLIPtin (JANUVIA) 100 MG tablet Take 1 tablet (100 mg total) by mouth daily.  90 tablet  1  . traMADol (ULTRAM) 50 MG tablet Take 50 mg by mouth. 2-3 by mouth at bedtime as needed       No current facility-administered medications on file prior to visit.   Review of Systems All otherwise neg per pt     Objective:   Physical Exam BP 112/70  Pulse 88  Temp(Src) 98.2 F (36.8 C) (Oral)  Ht 5\' 8"  (1.727 m)  Wt 212 lb (96.163 kg)  BMI 32.24 kg/m2  SpO2 97% VS noted, mild ill Constitutional: Pt appears well-developed, well-nourished.  HENT: Head: NCAT.  Right Ear: External ear normal.  Left Ear: External ear normal.  Eyes: . Pupils are equal, round, and reactive to light. Conjunctivae and EOM are normal Neck: Normal range of motion. Neck supple.  Bilat tm's with mild erythema.  Max sinus areas non tender.  Pharynx with mild erythema, no exudate Cardiovascular: Normal rate and regular rhythm.   Pulmonary/Chest: Effort normal and breath sounds without rales or wheezing  Neurological: Pt is alert. Not confused , motor grossly intact Skin: Skin is warm. No rash Psychiatric: Pt behavior is normal. No agitation.     Assessment & Plan:

## 2013-12-18 NOTE — Assessment & Plan Note (Signed)
Mild to mod, for antibx course,  to f/u any worsening symptoms or concerns, declines cxr 

## 2014-02-18 ENCOUNTER — Other Ambulatory Visit: Payer: Self-pay | Admitting: Internal Medicine

## 2014-03-27 ENCOUNTER — Other Ambulatory Visit: Payer: Self-pay

## 2014-03-27 DIAGNOSIS — I10 Essential (primary) hypertension: Secondary | ICD-10-CM

## 2014-03-27 MED ORDER — LISINOPRIL 20 MG PO TABS: 20.0000 mg | ORAL_TABLET | Freq: Every day | ORAL | Status: DC

## 2014-03-28 ENCOUNTER — Other Ambulatory Visit: Payer: Self-pay

## 2014-03-28 DIAGNOSIS — I10 Essential (primary) hypertension: Secondary | ICD-10-CM

## 2014-03-28 MED ORDER — LISINOPRIL 20 MG PO TABS
20.0000 mg | ORAL_TABLET | Freq: Every day | ORAL | Status: DC
Start: 2014-03-28 — End: 2014-04-11

## 2014-04-07 ENCOUNTER — Encounter: Payer: Self-pay | Admitting: Internal Medicine

## 2014-04-09 ENCOUNTER — Other Ambulatory Visit (INDEPENDENT_AMBULATORY_CARE_PROVIDER_SITE_OTHER): Payer: PRIVATE HEALTH INSURANCE

## 2014-04-09 DIAGNOSIS — IMO0001 Reserved for inherently not codable concepts without codable children: Secondary | ICD-10-CM

## 2014-04-09 DIAGNOSIS — E1165 Type 2 diabetes mellitus with hyperglycemia: Principal | ICD-10-CM

## 2014-04-09 LAB — HEMOGLOBIN A1C: HEMOGLOBIN A1C: 8.6 % — AB (ref 4.6–6.5)

## 2014-04-11 ENCOUNTER — Encounter: Payer: Self-pay | Admitting: Internal Medicine

## 2014-04-11 ENCOUNTER — Ambulatory Visit (INDEPENDENT_AMBULATORY_CARE_PROVIDER_SITE_OTHER): Payer: PRIVATE HEALTH INSURANCE | Admitting: Internal Medicine

## 2014-04-11 VITALS — BP 120/80 | HR 70 | Temp 98.7°F | Wt 214.2 lb

## 2014-04-11 DIAGNOSIS — R748 Abnormal levels of other serum enzymes: Secondary | ICD-10-CM

## 2014-04-11 DIAGNOSIS — E1165 Type 2 diabetes mellitus with hyperglycemia: Secondary | ICD-10-CM

## 2014-04-11 DIAGNOSIS — E162 Hypoglycemia, unspecified: Secondary | ICD-10-CM

## 2014-04-11 DIAGNOSIS — I10 Essential (primary) hypertension: Secondary | ICD-10-CM

## 2014-04-11 DIAGNOSIS — IMO0002 Reserved for concepts with insufficient information to code with codable children: Secondary | ICD-10-CM

## 2014-04-11 MED ORDER — DIAZEPAM 5 MG PO TABS: 5.0000 mg | ORAL_TABLET | Freq: Two times a day (BID) | ORAL | Status: DC | PRN

## 2014-04-11 MED ORDER — LISINOPRIL 20 MG PO TABS: 20.0000 mg | ORAL_TABLET | Freq: Every day | ORAL | Status: DC

## 2014-04-11 MED ORDER — METFORMIN HCL 1000 MG PO TABS: 1000.0000 mg | ORAL_TABLET | Freq: Two times a day (BID) | ORAL | Status: DC

## 2014-04-11 MED ORDER — SERTRALINE HCL 50 MG PO TABS: 75.0000 mg | ORAL_TABLET | Freq: Every day | ORAL | Status: DC

## 2014-04-11 NOTE — Progress Notes (Signed)
Pre visit review using our clinic review tool, if applicable. No additional management support is needed unless otherwise documented below in the visit note. 

## 2014-04-11 NOTE — Patient Instructions (Signed)
The  Endocrinology referral will be scheduled and you'll be notified of the time.

## 2014-04-11 NOTE — Progress Notes (Signed)
   Subjective:    Patient ID: Raven [name], female    DOB: 05-24-1956, 58 y.o.   MRN: [id number]  HPI   Her fasting blood sugars range 136-258. Last week  she had hypoglycemia with a value of 75. At that time she was sweaty, shaky, and had palpitations as well as nausea.  Yesterday she had symptoms of weakness, nausea, and shaky but the glucose was 275  She's on a low-carb and sweet avoidance die; she is unable to exercise due to her rheumatoid arthritis.  She does have polydipsia and polyuria. She denies polyphasia  She has had loose stools once to 2 times per day before lunch. She has up to 4 bowel movements a day. She questions whether the loose stools are related to Levemir she takes in the evening.  The 02/20/14 comprehensive metabolic profile collected by her Rheumatologist was normal except for a glucose of 179 and ALT of 80. Normal is less than 78. Hematocrit was slightly low at 35.  On 9/9 AST was 57 and ALT 106. On 9/23 AST had risen to 75 and ALT 140. Her methotrexate was discontinued; she continues prednisone 15 mg daily.  Review of Systems Epistaxis, hemoptysis, hematuria, melena, or rectal bleeding denied. No unexplained weight loss, significant dyspepsia,dysphagia, or abdominal pain.  There is no  bleeding, or difficulty stopping bleeding with injury. She bruises easily     Objective:   Physical Exam   Positive or pertinent findings include: Bruising left antecubital area. Fusiform changes right knee with crepitus.  General appearance :adequately nourished; in no distress. Eyes: No conjunctival inflammation or scleral icterus is present. Oral exam: Dental hygiene is good. Lips and gums are healthy appearing.There is no oropharyngeal erythema or exudate noted.  Heart:  Normal rate and regular rhythm. S1 and S2 normal without gallop, murmur, click, rub or other extra sounds   Lungs:Chest clear to auscultation; no wheezes, rhonchi,rales ,or rubs present.No  increased work of breathing.  Abdomen: bowel sounds normal, soft and non-tender without masses, organomegaly or hernias noted.  No guarding or rebound.  Vascular : all pulses equal ; no bruits present. Skin:Warm & dry.  Intact without suspicious lesions or rashes ; no jaundice or tenting Lymphatic: No lymphadenopathy is noted about the head, neck, axilla            Assessment & Plan:  #1 diabetes mellitus, suboptimal control. She has labile glucoses with some hypoglycemia. She will need to continue prednisone.  #2 progressive hepatic function test elevation, probably related to methotrexate which has been discontinued  #3 borderline anemia; history of physical do not suggest significant bleeding dyscrasia  See orders and after visit summary

## 2014-04-12 ENCOUNTER — Telehealth: Payer: Self-pay | Admitting: Internal Medicine

## 2014-04-12 NOTE — Telephone Encounter (Signed)
emmi emailed °

## 2014-04-29 ENCOUNTER — Encounter: Payer: Self-pay | Admitting: Internal Medicine

## 2014-04-29 ENCOUNTER — Ambulatory Visit (INDEPENDENT_AMBULATORY_CARE_PROVIDER_SITE_OTHER): Payer: PRIVATE HEALTH INSURANCE | Admitting: Internal Medicine

## 2014-04-29 ENCOUNTER — Other Ambulatory Visit (INDEPENDENT_AMBULATORY_CARE_PROVIDER_SITE_OTHER): Payer: PRIVATE HEALTH INSURANCE | Admitting: *Deleted

## 2014-04-29 VITALS — BP 120/84 | HR 82 | Temp 98.4°F | Resp 12 | Ht 68.0 in | Wt 214.0 lb

## 2014-04-29 DIAGNOSIS — IMO0002 Reserved for concepts with insufficient information to code with codable children: Secondary | ICD-10-CM

## 2014-04-29 DIAGNOSIS — Z23 Encounter for immunization: Secondary | ICD-10-CM

## 2014-04-29 DIAGNOSIS — E1165 Type 2 diabetes mellitus with hyperglycemia: Secondary | ICD-10-CM

## 2014-04-29 MED ORDER — SITAGLIPTIN PHOSPHATE 100 MG PO TABS: 100.0000 mg | ORAL_TABLET | Freq: Every day | ORAL | Status: DC

## 2014-04-29 MED ORDER — METFORMIN HCL ER 500 MG PO TB24: 1000.0000 mg | ORAL_TABLET | Freq: Two times a day (BID) | ORAL | Status: DC

## 2014-04-29 NOTE — Progress Notes (Signed)
Patient ID: Raven [name], female   DOB: 27-Sep-1955, 58 y.o.   MRN: [id number]  HPI: Raven [name] is a 58 y.o.-year-old female, referred by her PCP, Dr. Rowe Robert, for management of DM2, insulin-dependent, uncontrolled, without complications. She is here with my pt who is also my pt Chevis Pretty).  Patient has been diagnosed with diabetes in ~1995; she started insulin in 11/2013. Last hemoglobin A1c was: Lab Results  Component Value Date   HGBA1C 8.6* 04/09/2014   HGBA1C 9.3* 11/23/2013   HGBA1C 7.6* 06/29/2013   Pt is on a regimen of: - Metformin 1000 mg po bid - Levemir 17 units qhs She took Januvia in the past.  She tried Glimepiride in the past >> fluctuating blood sugar.  She is on Prednisone 15 mg daily for RA for the last 12 weeks. She will stay on it indefinitely as of now.  Pt checks her sugars 1x a day and they are: - am: 104-203, 235, 258 - 2h after b'fast: n/c - before lunch: n/c - 2h after lunch: 75 x1 - before dinner: n/c - 2h after dinner: n/c - bedtime: n/c - nighttime: n/c No lows. Lowest sugar was 75 x1 after lunch; she has hypoglycemia awareness at 80.  Highest sugar was 258.  Pt's meals are: - Breakfast: scrambled eggs + bacon + tomatoes - Lunch: meat + 2 veggies + no bread unless burger - Dinner: meat + 2 veggies - Snacks: 2: nuts; cheese;   - no CKD, last BUN/creatinine:  Lab Results  Component Value Date   BUN 13 11/23/2013   CREATININE 0.7 11/23/2013  On Lisinopril. - last set of lipids: Lab Results  Component Value Date   CHOL 166 06/29/2013   HDL 45.60 06/29/2013   LDLCALC 105* 06/29/2013   LDLDIRECT 142.3 03/06/2008   TRIG 76.0 06/29/2013   CHOLHDL 4 06/29/2013  She was on statins but taken off b/c transaminitis in 10/2013. - last eye exam was in 2014. No DR.  - no numbness and tingling in her feet.  Pt has FH of DM in mother, father, daughter, grandson (type 1).  ROS: Constitutional: + weight gain, + fatigue, no subjective  hyperthermia/hypothermia, + poor sleep Eyes: no blurry vision, no xerophthalmia ENT: no sore throat, no nodules palpated in throat, no dysphagia/odynophagia, no hoarseness, + tinnitus, + hypoacusis Cardiovascular: no CP/SOB/palpitations/leg swelling Respiratory: no cough/SOB Gastrointestinal: no N/V/+ D/no C/+ heartburn Musculoskeletal: no muscle/+ joint aches Skin: no rashes, + itching, + easy bruising Neurological: +  tremors/numbness/tingling/dizziness Psychiatric: + depression/no anxiety + low libido  Past Medical History  Diagnosis Date  . Hyperlipidemia   . Migraine   . Hypertension   . Arthritis knees, hands, jaw  . Acute medial meniscus tear of right knee   . GERD (gastroesophageal reflux disease) per pt watches diet and take tums as needed  . Diabetes mellitus oral meds  . Anxiety   . Seasonal allergies   . Interstitial cystitis   . H/O hiatal hernia   . Lichen sclerosus of female genitalia    Past Surgical History  Procedure Laterality Date  . Tubal ligation  1981  . Breast biopsy  2001    fibrocystic breast disease  . Abdominal hysterectomy  1991    for fibroids  . Cystoscopy  2004    for chronic UTI  . Knee arthroscopy  10/21/2011    Procedure: ARTHROSCOPY KNEE;  Surgeon: Jacki Cones, MD;  Location: [facility];  Service: Orthopedics;  Laterality:  Right;  WITH MEDIAL and lateral shaving of femoral chondyl  with microfracture technique of lateral and medial femoral chondyl suprapatellar synovectomy  . Intraarticular steroid injection  2013    3 R knee & L X 1; Dr Netta Corrigan  . Knee arthrocentesis  2013    R knee x 3 & L X 1  . Total knee arthroplasty  04/12/2012    Procedure: TOTAL KNEE ARTHROPLASTY;  Surgeon: Jacki Cones, MD;  Location: [facility];  Service: Orthopedics;  Laterality: Right;  Right Total Knee Arthroplasty   History   Social History  . Marital Status: Married    Spouse Name: N/A    Number of Children: 3    Occupational  History  . [employer]   Social History Main Topics  . Smoking status: Never Smoker   . Smokeless tobacco: Never Used  . Alcohol Use: No  . Drug Use: No   Social History Narrative   2 cups of coffee/day x 3 months   Regular exercise- no   Religion affecting care, "it allows stress management:"   Current Outpatient Prescriptions on File Prior to Visit  Medication Sig Dispense Refill  . cetirizine (ZYRTEC) 10 MG tablet Take 10 mg by mouth every evening.       . diazepam (VALIUM) 5 MG tablet Take 1 tablet (5 mg total) by mouth every 12 (twelve) hours as needed. Migraine headaches  30 tablet  0  . FOLIC ACID PO Take 3 mg by mouth daily.      Marland Kitchen glucose blood (ONE TOUCH ULTRA TEST) test strip Check blood sugar daily dx: 250.02  100 each  3  . Insulin Detemir (LEVEMIR) 100 UNIT/ML Pen Inject 17 Units into the skin daily at 10 pm.      . lisinopril (PRINIVIL,ZESTRIL) 20 MG tablet Take 1 tablet (20 mg total) by mouth at bedtime.  90 tablet  1  . metFORMIN (GLUCOPHAGE) 1000 MG tablet Take 1 tablet (1,000 mg total) by mouth 2 (two) times daily with a meal.  180 tablet  1  . Omega-3 Fatty Acids (FISH OIL) 1000 MG CAPS Take by mouth 2 (two) times daily.      Letta Pate DELICA LANCETS FINE MISC 1 each by Does not apply route daily. Dx:250.02  100 each  1  . predniSONE (DELTASONE) 5 MG tablet Take 15 mg by mouth daily with breakfast.       . primidone (MYSOLINE) 50 MG tablet Take 50 mg by mouth. 3 by mouth at bedtime      . sertraline (ZOLOFT) 50 MG tablet Take 1.5 tablets (75 mg total) by mouth daily with breakfast.  45 tablet  2  . traMADol (ULTRAM) 50 MG tablet Take 50 mg by mouth. 2-3 by mouth at bedtime as needed       No current facility-administered medications on file prior to visit.   Allergies  Allergen Reactions  . Penicillins Rash  . Hydrocodone-Acetaminophen Nausea And Vomiting   Family History  Problem Relation Age of Onset  . Diabetes Mother   . Stroke Mother 53   . Stroke Brother 27  . Heart disease Father     CABG  . Lung cancer Father   . Prostate cancer Father   . Pancreatic cancer Father   . Pancreatic cancer Paternal Grandfather   . Bipolar disorder Daughter   . Anxiety disorder Son   . Osteoarthritis Mother   . Arthritis Maternal Grandmother     rheumatoid  PE: BP 120/84  Pulse 82  Temp(Src) 98.4 F (36.9 C) (Oral)  Resp 12  Ht 5\' 8"  (1.727 m)  Wt 214 lb (97.07 kg)  BMI 32.55 kg/m2  SpO2 97% Wt Readings from Last 3 Encounters:  04/29/14 214 lb (97.07 kg)  04/11/14 214 lb 4 oz (97.183 kg)  12/18/13 212 lb (96.163 kg)   Constitutional: overweight, in NAD Eyes: PERRLA, EOMI, no exophthalmos ENT: moist mucous membranes, no thyromegaly, no cervical lymphadenopathy Cardiovascular: RRR, No MRG Respiratory: CTA B Gastrointestinal: abdomen soft, NT, ND, BS+ Musculoskeletal: no deformities, strength intact in all 4 Skin: moist, warm, no rashes Neurological: no tremor with outstretched hands, DTR normal in all 4  ASSESSMENT: 1. DM2, insulin-dependent, uncontrolled, without complications  PLAN:  1. Patient with long-standing, uncontrolled diabetes, on oral antidiabetic regimen + basal insulin, which is insufficient. She is on Prednisone 15 mg daily >> she will need mealtime coverage. We will also switch to Metformin ER as she has diarrhea. - We discussed about options for treatment, and I suggested to:  Patient Instructions  Please continue Levemir 17 units at bedtime. Stop regular metformin and start Metformin XR 1000 mg 2x a day. Start Januvia 100 mg in am. Please return in 1 month with your sugar log.  - will give flu vaccine today - continue checking sugars at different times of the day - check 1-2 times a day, rotating checks - given sugar log and advised how to fill it and to bring it at next appt  - given foot care handout and explained the principles  - given instructions for hypoglycemia management "15-15 rule"  -  advised for yearly eye exams >> needs a new one - Return to clinic in 1 mo with sugar log

## 2014-04-29 NOTE — Patient Instructions (Signed)
Please continue Levemir 17 units at bedtime. Stop regular metformin and start Metformin XR 1000 mg 2x a day. Start Januvia 100 mg in am.  Please return in 1 month with your sugar log.   PATIENT INSTRUCTIONS FOR TYPE 2 DIABETES:  **Please join MyChart!** - see attached instructions about how to join if you have not done so already.  DIET AND EXERCISE Diet and exercise is an important part of diabetic treatment.  We recommended aerobic exercise in the form of brisk walking (working between 40-60% of maximal aerobic capacity, similar to brisk walking) for 150 minutes per week (such as 30 minutes five days per week) along with 3 times per week performing 'resistance' training (using various gauge rubber tubes with handles) 5-10 exercises involving the major muscle groups (upper body, lower body and core) performing 10-15 repetitions (or near fatigue) each exercise. Start at half the above goal but build slowly to reach the above goals. If limited by weight, joint pain, or disability, we recommend daily walking in a swimming pool with water up to waist to reduce pressure from joints while allow for adequate exercise.    BLOOD GLUCOSES Monitoring your blood glucoses is important for continued management of your diabetes. Please check your blood glucoses 2-4 times a day: fasting, before meals and at bedtime (you can rotate these measurements - e.g. one day check before the 3 meals, the next day check before 2 of the meals and before bedtime, etc.).   HYPOGLYCEMIA (low blood sugar) Hypoglycemia is usually a reaction to not eating, exercising, or taking too much insulin/ other diabetes drugs.  Symptoms include tremors, sweating, hunger, confusion, headache, etc. Treat IMMEDIATELY with 15 grams of Carbs:   4 glucose tablets    cup regular juice/soda   2 tablespoons raisins   4 teaspoons sugar   1 tablespoon honey Recheck blood glucose in 15 mins and repeat above if still symptomatic/blood glucose  <100.  RECOMMENDATIONS TO REDUCE YOUR RISK OF DIABETIC COMPLICATIONS: * Take your prescribed MEDICATION(S) * Follow a DIABETIC diet: Complex carbs, fiber rich foods, (monounsaturated and polyunsaturated) fats * AVOID saturated/trans fats, high fat foods, >2,300 mg salt per day. * EXERCISE at least 5 times a week for 30 minutes or preferably daily.  * DO NOT SMOKE OR DRINK more than 1 drink a day. * Check your FEET every day. Do not wear tightfitting shoes. Contact us if you develop an ulcer * See your EYE doctor once a year or more if needed * Get a FLU shot once a year * Get a PNEUMONIA vaccine once before and once after age 82 years  GOALS:  * Your Hemoglobin A1c of <7%  * fasting sugars need to be <130 * after meals sugars need to be <180 (2h after you start eating) * Your Systolic BP should be 140 or lower  * Your Diastolic BP should be 80 or lower  * Your HDL (Good Cholesterol) should be 40 or higher  * Your LDL (Bad Cholesterol) should be 100 or lower. * Your Triglycerides should be 150 or lower  * Your Urine microalbumin (kidney function) should be <30 * Your Body Mass Index should be 25 or lower    Please consider the following ways to cut down carbs and fat and increase fiber and micronutrients in your diet: - substitute whole grain for white bread or pasta - substitute brown rice for white rice - substitute 90-calorie flat bread pieces for slices of bread when possible -  substitute sweet potatoes or yams for white potatoes - substitute humus for margarine - substitute tofu for cheese when possible - substitute almond or rice milk for regular milk (would not drink soy milk daily due to concern for soy estrogen influence on breast cancer risk) - substitute dark chocolate for other sweets when possible - substitute water - can add lemon or orange slices for taste - for diet sodas (artificial sweeteners will trick your body that you can eat sweets without getting calories and  will lead you to overeating and weight gain in the long run) - do not skip breakfast or other meals (this will slow down the metabolism and will result in more weight gain over time)  - can try smoothies made from fruit and almond/rice milk in am instead of regular breakfast - can also try old-fashioned (not instant) oatmeal made with almond/rice milk in am - order the dressing on the side when eating salad at a restaurant (pour less than half of the dressing on the salad) - eat as little meat as possible - can try juicing, but should not forget that juicing will get rid of the fiber, so would alternate with eating raw veg./fruits or drinking smoothies - use as little oil as possible, even when using olive oil - can dress a salad with a mix of balsamic vinegar and lemon juice, for e.g. - use agave nectar, stevia sugar, or regular sugar rather than artificial sweateners - steam or broil/roast veggies  - snack on veggies/fruit/nuts (unsalted, preferably) when possible, rather than processed foods - reduce or eliminate aspartame in diet (it is in diet sodas, chewing gum, etc) Read the labels!  Try to read Dr. Katherina Right book: "Program for Reversing Diabetes" for other ideas for healthy eating.

## 2014-05-01 ENCOUNTER — Encounter: Payer: Self-pay | Admitting: Internal Medicine

## 2014-06-03 ENCOUNTER — Ambulatory Visit (INDEPENDENT_AMBULATORY_CARE_PROVIDER_SITE_OTHER): Payer: PRIVATE HEALTH INSURANCE | Admitting: Internal Medicine

## 2014-06-03 ENCOUNTER — Encounter: Payer: Self-pay | Admitting: Internal Medicine

## 2014-06-03 VITALS — BP 120/82 | HR 89 | Temp 98.1°F | Resp 12 | Wt 215.0 lb

## 2014-06-03 DIAGNOSIS — Z794 Long term (current) use of insulin: Secondary | ICD-10-CM

## 2014-06-03 DIAGNOSIS — IMO0002 Reserved for concepts with insufficient information to code with codable children: Secondary | ICD-10-CM

## 2014-06-03 DIAGNOSIS — E1165 Type 2 diabetes mellitus with hyperglycemia: Secondary | ICD-10-CM

## 2014-06-03 MED ORDER — METFORMIN HCL ER 500 MG PO TB24: 1000.0000 mg | ORAL_TABLET | Freq: Two times a day (BID) | ORAL | Status: DC

## 2014-06-03 MED ORDER — INSULIN DETEMIR 100 UNIT/ML FLEXPEN: 17.0000 [IU] | PEN_INJECTOR | Freq: Every day | SUBCUTANEOUS | Status: DC

## 2014-06-03 NOTE — Patient Instructions (Signed)
Please continue: - Metformin 1000 mg 2x a day - Levemir 17 units at bedtime - Januvia 100 mg daily in am  Please return in 1.5 months with your sugar log.

## 2014-06-03 NOTE — Progress Notes (Signed)
Patient ID: Raven [name], female   DOB: 1956-03-06, 57 y.o.   MRN: [id number]  HPI: Raven [name] is a 58 y.o.-year-old female, returning for f/u for DM2, dx ~1995, insulin-dependent since 11/2013, uncontrolled, without complications. Last visit 1 mo ago.  Last hemoglobin A1c was: Lab Results  Component Value Date   HGBA1C 8.6* 04/09/2014   HGBA1C 9.3* 11/23/2013   HGBA1C 7.6* 06/29/2013   She is on Prednisone 10 mg now since 05/12/2014. Was on 15 mg for a long time. She has RA >> on Remicade and MTX.  Pt is on a regimen of: -Metformin  1000 mg po bid - Levemir 17 units qhs - Januvia 100 mg daily in am >> started 04/2014 She took Januvia in the past.  She tried Glimepiride in the past >> fluctuating blood sugar.  She is on Prednisone 15 mg daily for RA for the last 12 weeks. She will stay on it indefinitely as of now.  Pt checks her sugars 1x a day and they are: - am: 104-203, 235, 258 >> 84-140, 212 (ate late) - 2h after b'fast: n/c >> 137 - before lunch: n/c >> 84-140 - 2h after lunch: 75 x1 >> 145 - before dinner: n/c >> 82-143 - 2h after dinner: n/c >> 121 - bedtime: n/c >> 122-150, if dessert >> 213-214 - nighttime: n/c No lows. Lowest sugar was 75 x1 after lunch >> 82; she has hypoglycemia awareness at 80.  Highest sugar was 258 >> 214  Pt's meals are: - Breakfast: scrambled eggs + bacon + tomatoes - Lunch: meat + 2 veggies + no bread unless burger - Dinner: meat + 2 veggies - Snacks: 2: nuts; cheese;   - no CKD, last BUN/creatinine:  Lab Results  Component Value Date   BUN 13 11/23/2013   CREATININE 0.7 11/23/2013  On Lisinopril. - last set of lipids: Lab Results  Component Value Date   CHOL 166 06/29/2013   HDL 45.60 06/29/2013   LDLCALC 105* 06/29/2013   LDLDIRECT 142.3 03/06/2008   TRIG 76.0 06/29/2013   CHOLHDL 4 06/29/2013  She was on statins but taken off b/c transaminitis in 10/2013. - last eye exam was in 2014. No DR. She has a new one  06/2014.  - no numbness and tingling in her feet.  I reviewed pt's medications, allergies, PMH, social hx, family hx and no changes required, except as mentioned above.  ROS: Constitutional: no weight gain, no fatigue, no subjective hyperthermia/hypothermia Eyes: no blurry vision, no xerophthalmia ENT: no sore throat, no nodules palpated in throat, no dysphagia/odynophagia, no hoarseness Cardiovascular: no CP/SOB/palpitations/leg swelling Respiratory: no cough/SOB Gastrointestinal: no N/V/+ D/no C/no heartburn Musculoskeletal: no muscle/+ joint aches and swelling Skin: no rashes, + hair loss Neurological: no  tremors/numbness/tingling/dizziness  PE: BP 120/82 mmHg  Pulse 89  Temp(Src) 98.1 F (36.7 C) (Oral)  Resp 12  Wt 215 lb (97.523 kg)  SpO2 96% Wt Readings from Last 3 Encounters:  06/03/14 215 lb (97.523 kg)  04/29/14 214 lb (97.07 kg)  04/11/14 214 lb 4 oz (97.183 kg)   Constitutional: overweight, in NAD Eyes: PERRLA, EOMI, no exophthalmos ENT: moist mucous membranes, no thyromegaly, no cervical lymphadenopathy Cardiovascular: RRR, No MRG Respiratory: CTA B Gastrointestinal: abdomen soft, NT, ND, BS+ Musculoskeletal: no deformities, strength intact in all 4 Skin: moist, warm, no rashes Neurological: no tremor with outstretched hands, DTR normal in all 4  ASSESSMENT: 1. DM2, insulin-dependent, uncontrolled, without complications  PLAN:  1. Patient with long-standing,  uncontrolled diabetes, on oral antidiabetic regimen + basal insulin, with great improvement after adding Januvia - We discussed about options for treatment, and I suggested to:  Patient Instructions  Please continue: - Metformin 1000 mg 2x a day - Levemir 17 units at bedtime - Januvia 100 mg daily in am  Please return in 1.5 months with your sugar log.  - had flu vaccine this season - continue checking sugars at different times of the day - check 1-2 times a day, rotating checks - advised for  yearly eye exams >> needs a new one > scheduled - refilled meds - Return to clinic in 1.5 mo with sugar log

## 2014-06-24 ENCOUNTER — Encounter: Payer: Self-pay | Admitting: Internal Medicine

## 2014-06-24 ENCOUNTER — Other Ambulatory Visit (INDEPENDENT_AMBULATORY_CARE_PROVIDER_SITE_OTHER): Payer: PRIVATE HEALTH INSURANCE

## 2014-06-24 ENCOUNTER — Ambulatory Visit (INDEPENDENT_AMBULATORY_CARE_PROVIDER_SITE_OTHER): Payer: PRIVATE HEALTH INSURANCE | Admitting: Internal Medicine

## 2014-06-24 VITALS — BP 120/82 | HR 79 | Temp 97.8°F | Wt 213.0 lb

## 2014-06-24 DIAGNOSIS — F411 Generalized anxiety disorder: Secondary | ICD-10-CM

## 2014-06-24 DIAGNOSIS — IMO0002 Reserved for concepts with insufficient information to code with codable children: Secondary | ICD-10-CM

## 2014-06-24 DIAGNOSIS — E1165 Type 2 diabetes mellitus with hyperglycemia: Secondary | ICD-10-CM

## 2014-06-24 DIAGNOSIS — I10 Essential (primary) hypertension: Secondary | ICD-10-CM

## 2014-06-24 DIAGNOSIS — E785 Hyperlipidemia, unspecified: Secondary | ICD-10-CM

## 2014-06-24 LAB — HEPATIC FUNCTION PANEL
ALT: 34 U/L (ref 0–35)
AST: 21 U/L (ref 0–37)
Albumin: 3.9 g/dL (ref 3.5–5.2)
Alkaline Phosphatase: 42 U/L (ref 39–117)
BILIRUBIN DIRECT: 0 mg/dL (ref 0.0–0.3)
BILIRUBIN TOTAL: 0.4 mg/dL (ref 0.2–1.2)
Total Protein: 6.9 g/dL (ref 6.0–8.3)

## 2014-06-24 LAB — BASIC METABOLIC PANEL
BUN: 13 mg/dL (ref 6–23)
CO2: 25 mEq/L (ref 19–32)
CREATININE: 0.7 mg/dL (ref 0.4–1.2)
Calcium: 8.9 mg/dL (ref 8.4–10.5)
Chloride: 103 mEq/L (ref 96–112)
GFR: 86.73 mL/min (ref >60.00)
Glucose, Bld: 92 mg/dL (ref 70–99)
Potassium: 4.7 mEq/L (ref 3.5–5.1)
Sodium: 136 mEq/L (ref 135–145)

## 2014-06-24 LAB — LIPID PANEL
CHOL/HDL RATIO: 6
Cholesterol: 257 mg/dL — ABNORMAL HIGH (ref 0–200)
HDL: 42.4 mg/dL (ref >39.00)
LDL CALC: 186 mg/dL — AB (ref 0–99)
NonHDL: 214.6
Triglycerides: 142 mg/dL (ref 0.0–149.0)
VLDL: 28.4 mg/dL (ref 0.0–40.0)

## 2014-06-24 LAB — TSH: TSH: 1.64 u[IU]/mL (ref 0.35–4.50)

## 2014-06-24 MED ORDER — LISINOPRIL 20 MG PO TABS: 20.0000 mg | ORAL_TABLET | Freq: Every day | ORAL | Status: DC

## 2014-06-24 MED ORDER — SERTRALINE HCL 50 MG PO TABS: 75.0000 mg | ORAL_TABLET | Freq: Every day | ORAL | Status: DC

## 2014-06-24 NOTE — Progress Notes (Signed)
Pre visit review using our clinic review tool, if applicable. No additional management support is needed unless otherwise documented below in the visit note. 

## 2014-06-24 NOTE — Progress Notes (Signed)
   Subjective:    Patient ID: Raven [name], female    DOB: 12-Aug-1955, 58 y.o.   MRN: [id number]  HPI   She is here for refill of the sertraline as well as lisinopril. She's been compliant with her medications without adverse effect.  She is on a low carb diet. She walks 30 minutes 3 times a week without associated cardiopulmonary symptoms.  Her blood pressure typically averages 120/80 at home. Her last renal function study was in May of this year. BUN, creatinine, and potassium were normal.  The sertraline has been very effective even though she's been under a great deal of stress. She states "I'm not crying".  Her last TSH was 1.39 in August 2014.   Dr Elvera Lennox monitors her DM. Her last A1c was 04/09/14 with a value of 8.6%. As her steroid dose for her rheumatoid arthritis is being weaned; there has  been improvement  In her glucoses.  Her lipids have not been checked since December 2014; the LDL was mildly elevated at 104 at that time.    Review of Systems   She denies panic attacks or depression  Appetite is good and she sleeping well.  Chest pain, palpitations, tachycardia, exertional dyspnea, paroxysmal nocturnal dyspnea, claudication or edema are absent.       Objective:   Physical Exam Appears healthy and well-nourished & in no acute distress  No carotid bruits are present.No neck vein distention present at 10 - 15 degrees. Thyroid normal to palpation  Heart rhythm and rate are normal with no gallop or murmur  Chest is clear with no increased work of breathing  There is no evidence of aortic aneurysm or renal artery bruits  Abdomen soft with no organomegaly or masses. No HJR  No clubbing, cyanosis or edema present. She has fusiform enlargement of the knees with crepitus.  Pedal pulses are intact   No ischemic skin changes are present . Fingernails healthy   Alert and oriented.   She is animated and communicative with normal affect.  Strength, tone,  DTRs reflexes normal         Assessment & Plan:  #1 hypertension, controlled  #2 anxiety, controlled on sertraline 75 mg daily  #3 uncontrolled DM , as per Dr Elvera Lennox  #4 dyslipidemia  Plan: See orders and recommendations.

## 2014-06-24 NOTE — Patient Instructions (Signed)

## 2014-06-25 ENCOUNTER — Other Ambulatory Visit: Payer: Self-pay | Admitting: Internal Medicine

## 2014-06-25 DIAGNOSIS — E785 Hyperlipidemia, unspecified: Secondary | ICD-10-CM

## 2014-06-25 MED ORDER — ATORVASTATIN CALCIUM 20 MG PO TABS: ORAL_TABLET | ORAL | Status: DC

## 2014-06-25 NOTE — Addendum Note (Signed)
Addended by: Lyanne Co R on: 06/25/2014 08:21 AM   Modules accepted: Orders

## 2014-07-16 ENCOUNTER — Ambulatory Visit (INDEPENDENT_AMBULATORY_CARE_PROVIDER_SITE_OTHER): Payer: PRIVATE HEALTH INSURANCE | Admitting: Internal Medicine

## 2014-07-16 ENCOUNTER — Encounter: Payer: Self-pay | Admitting: Internal Medicine

## 2014-07-16 ENCOUNTER — Other Ambulatory Visit: Payer: Self-pay | Admitting: *Deleted

## 2014-07-16 VITALS — BP 112/70 | HR 91 | Temp 98.6°F | Resp 12 | Wt 214.0 lb

## 2014-07-16 DIAGNOSIS — Z794 Long term (current) use of insulin: Secondary | ICD-10-CM

## 2014-07-16 DIAGNOSIS — IMO0002 Reserved for concepts with insufficient information to code with codable children: Secondary | ICD-10-CM

## 2014-07-16 DIAGNOSIS — E1165 Type 2 diabetes mellitus with hyperglycemia: Secondary | ICD-10-CM

## 2014-07-16 MED ORDER — INSULIN DETEMIR 100 UNIT/ML FLEXPEN: 14.0000 [IU] | PEN_INJECTOR | Freq: Every day | SUBCUTANEOUS | Status: DC

## 2014-07-16 NOTE — Progress Notes (Signed)
Patient ID: Raven [name], female   DOB: 04-09-1956, 59 y.o.   MRN: [id number]  HPI: Raven [name] is a 59 y.o.-year-old female, returning for f/u for DM2, dx ~1995, insulin-dependent since 11/2013, uncontrolled, without complications. Last visit 1.5 mo ago.  Last hemoglobin A1c was: Lab Results  Component Value Date   HGBA1C 8.6* 04/09/2014   HGBA1C 9.3* 11/23/2013   HGBA1C 7.6* 06/29/2013   She is on Prednisone 10 mg since 05/12/2014 >> 7.5 mg in last month >> will switching 5 mg soon. Was on 15 mg for a long time. She has RA >> on Remicade and MTX. Prednisone not helping much her RA >> started Tramadol >> pain better >> she is more active.  Pt is on a regimen of: - Metformin  1000 mg po bid - Levemir 17 units qhs - Januvia 100 mg daily in am >> started 04/2014 >> sugars better afterwards She took Januvia in the past.  She tried Glimepiride in the past >> fluctuating blood sugar.  She is on Prednisone 15 mg daily for RA for the last 12 weeks. She will stay on it indefinitely as of now.  Pt checks her sugars 1x a day and they are wonderful: - am: 104-203, 235, 258 >> 84-140, 212 (ate late) >> 83-120 (mostly 80s) - 2h after b'fast: n/c >> 137 >> 140, 155 - before lunch: n/c >> 84-140 >> 84, 89 - 2h after lunch: 75 x1 >> 145 >> 129-159 - before dinner: n/c >> 82-143 >> 83-86 - 2h after dinner: n/c >> 121 >> 120-160 - bedtime: n/c >> 122-150, if dessert >> 213-214 >> 95-105 - nighttime: n/c No lows. Lowest sugar was 83;  she has hypoglycemia awareness at 80.  Highest sugar was 258 >> 214 >> 160  Pt's meals are: - Breakfast: scrambled eggs + bacon + tomatoes - Lunch: meat + 2 veggies + no bread unless burger - Dinner: meat + 2 veggies - Snacks: 2: nuts; cheese;   - no CKD, last BUN/creatinine:  Lab Results  Component Value Date   BUN 13 06/24/2014   CREATININE 0.7 06/24/2014  On Lisinopril. - last set of lipids: Lab Results  Component Value Date   CHOL 257*  06/24/2014   HDL 42.40 06/24/2014   LDLCALC 186* 06/24/2014   LDLDIRECT 142.3 03/06/2008   TRIG 142.0 06/24/2014   CHOLHDL 6 06/24/2014  She was on statins but taken off b/c transaminitis in 10/2013. - next eye exam will be in 07/2014. No DR.  - no numbness and tingling in her feet.  I reviewed pt's medications, allergies, PMH, social hx, family hx, and changes were documented in the history of present illness. Otherwise, unchanged from my initial visit note.  ROS: Constitutional: no weight gain, no fatigue, no subjective hyperthermia/hypothermia Eyes: no blurry vision, no xerophthalmia ENT: no sore throat, no nodules palpated in throat, no dysphagia/odynophagia, no hoarseness Cardiovascular: no CP/SOB/palpitations/leg swelling Respiratory: no cough/SOB Gastrointestinal: no N/V/D/C/heartburn Musculoskeletal: no muscle/+ joint aches and swelling Skin: no rashes Neurological: no  tremors/numbness/tingling/dizziness  PE: BP 112/70 mmHg  Pulse 91  Temp(Src) 98.6 F (37 C) (Oral)  Resp 12  Wt 214 lb (97.07 kg)  SpO2 97% Wt Readings from Last 3 Encounters:  07/16/14 214 lb (97.07 kg)  06/24/14 213 lb (96.616 kg)  06/03/14 215 lb (97.523 kg)   Constitutional: overweight, in NAD Eyes: PERRLA, EOMI, no exophthalmos ENT: moist mucous membranes, no thyromegaly, no cervical lymphadenopathy Cardiovascular: RRR, No MRG Respiratory: CTA  B Gastrointestinal: abdomen soft, NT, ND, BS+ Musculoskeletal: no deformities, strength intact in all 4 Skin: moist, warm, no rashes Neurological: no tremor with outstretched hands, DTR normal in all 4  ASSESSMENT: 1. DM2, insulin-dependent, uncontrolled, without complications  PLAN:  1. Patient with long-standing, uncontrolled diabetes, on oral antidiabetic regimen + basal insulin, with great improvement after adding Januvia >> sugars great now - We discussed about options for treatment, and I suggested to:  Patient Instructions  Please  continue: - Metformin 1000 mg 2x a day - Januvia 100 mg daily in am Please decrease:  - Levemir to 14 units at bedtime >> advised to continue to decrease this by 3 units until off if sugars allow, now that she is also tapering the steroid dose to off Please return in 3 months with your sugar log. Please stop at [facility] lab downstairs.  - had flu vaccine this season - continue checking sugars at different times of the day - check 1-2 times a day, rotating checks - advised for yearly eye exams >> needs a new one > scheduled - recheck HbA1c - Return to clinic in 1.5 mo with sugar log   Orders Only on 07/16/2014  Component Date Value Ref Range Status  . Hgb A1c MFr Bld 07/16/2014 6.9* <5.7 % Final   Comment:                                                                        According to the ADA Clinical Practice Recommendations for 2011, when HbA1c is used as a screening test:     >=6.5%   Diagnostic of Diabetes Mellitus            (if abnormal result is confirmed)   5.7-6.4%   Increased risk of developing Diabetes Mellitus   References:Diagnosis and Classification of Diabetes Mellitus,Diabetes Care,2011,34(Suppl 1):S62-S69 and Standards of Medical Care in         Diabetes - 2011,Diabetes Care,2011,34 (Suppl 1):S11-S61.     . Mean Plasma Glucose 07/16/2014 151* <117 mg/dL Final   Exceptional VXY8A!

## 2014-07-16 NOTE — Patient Instructions (Signed)
Patient Instructions  Please continue: - Metformin 1000 mg 2x a day - Januvia 100 mg daily in am Please decrease:  - Levemir 14 units at bedtime  Please return in 3 months with your sugar log.  Please stop at the lab.

## 2014-07-17 LAB — HEMOGLOBIN A1C
Hgb A1c MFr Bld: 6.9 % — ABNORMAL HIGH (ref <5.7)
MEAN PLASMA GLUCOSE: 151 mg/dL — AB (ref <117)

## 2014-09-19 ENCOUNTER — Other Ambulatory Visit: Payer: Self-pay | Admitting: Internal Medicine

## 2014-09-19 ENCOUNTER — Telehealth: Payer: Self-pay | Admitting: *Deleted

## 2014-09-19 MED ORDER — SITAGLIPTIN PHOSPHATE 100 MG PO TABS: 100.0000 mg | ORAL_TABLET | Freq: Every day | ORAL | Status: DC

## 2014-09-19 NOTE — Telephone Encounter (Signed)
Rx refilled.

## 2014-10-15 ENCOUNTER — Encounter: Payer: Self-pay | Admitting: Internal Medicine

## 2014-10-15 ENCOUNTER — Ambulatory Visit (INDEPENDENT_AMBULATORY_CARE_PROVIDER_SITE_OTHER): Payer: PRIVATE HEALTH INSURANCE | Admitting: Internal Medicine

## 2014-10-15 VITALS — BP 132/80 | HR 90 | Temp 98.5°F | Resp 12 | Wt 207.2 lb

## 2014-10-15 DIAGNOSIS — E1165 Type 2 diabetes mellitus with hyperglycemia: Secondary | ICD-10-CM

## 2014-10-15 DIAGNOSIS — IMO0002 Reserved for concepts with insufficient information to code with codable children: Secondary | ICD-10-CM

## 2014-10-15 DIAGNOSIS — Z794 Long term (current) use of insulin: Secondary | ICD-10-CM | POA: Diagnosis not present

## 2014-10-15 LAB — HEMOGLOBIN A1C: Hgb A1c MFr Bld: 6.9 % — ABNORMAL HIGH (ref 4.6–6.5)

## 2014-10-15 MED ORDER — INSULIN DETEMIR 100 UNIT/ML FLEXPEN: 17.0000 [IU] | PEN_INJECTOR | Freq: Every day | SUBCUTANEOUS | Status: DC

## 2014-10-15 MED ORDER — SITAGLIPTIN PHOSPHATE 100 MG PO TABS: 100.0000 mg | ORAL_TABLET | Freq: Every day | ORAL | Status: DC

## 2014-10-15 MED ORDER — METFORMIN HCL ER 500 MG PO TB24: 1000.0000 mg | ORAL_TABLET | Freq: Two times a day (BID) | ORAL | Status: DC

## 2014-10-15 NOTE — Progress Notes (Signed)
Patient ID: Raven [name], female   DOB: 20-Jul-1955, 59 y.o.   MRN: [id number]  HPI: Raven [name] is a 59 y.o.-year-old femalear-old female, returning for f/u for DM2, dx ~1995, insulin-dependent since 11/2013, uncontrolled, without complications. Last visit 3 mo ago.  She restarted to have migraines - 3 in March.  Last hemoglobin A1c was: Lab Results  Component Value Date   HGBA1C 6.9* 07/16/2014   HGBA1C 8.6* 04/09/2014   HGBA1C 9.3* 11/23/2013   She was on Prednisone 10 mg since 05/12/2014 >> 7.5 mg in last month >> 5 mg >> will decrease to 2.5 mg soon. Was on 15 mg for a long time. She has RA >> on Remicade and MTX. Prednisone not helping much her RA >> started Tramadol. She got a steroid inj. At the end of March >> sugars higher.   Pt is on a regimen of: - Metformin  1000 mg po bid - Levemir 17 units qhs - Januvia 100 mg daily in am >> started 04/2014 >> sugars better afterwards She took Januvia in the past.  She tried Glimepiride in the past >> fluctuating blood sugar.  She is on Prednisone 15 mg daily for RA for the last 12 weeks. She will stay on it indefinitely as of now.  Pt checks her sugars 1x a day and they are higher, especially in am: - am: 104-203, 235, 258 >> 84-140, 212 (ate late) >> 83-120 (mostly 80s) >> 137-155, 203 - 2h after b'fast: n/c >> 137 >> 140, 155 >> 156-190 - before lunch: n/c >> 84-140 >> 84, 89 >> 120 - 2h after lunch: 75 x1 >> 145 >> 129-159 >> 124-150 - before dinner: n/c >> 82-143 >> 83-86 >> n/c - 2h after dinner: n/c >> 121 >> 120-160 >> 137, 140 - bedtime: n/c >> 122-150, if dessert >> 213-214 >> 95-105 >> 119, 130 - nighttime: n/c No lows. Lowest sugar was 115;  she has hypoglycemia awareness at 80.  Highest sugar was 258 >> 214 >> 160 >> 203 (after steroid shot)  Pt's meals are: - Breakfast: scrambled eggs + bacon + tomatoes - Lunch: meat + 2 veggies + no bread unless burger - Dinner: meat + 2 veggies - Snacks: 2: nuts; cheese;   - no  CKD, last BUN/creatinine:  Lab Results  Component Value Date   BUN 13 06/24/2014   CREATININE 0.7 06/24/2014  On Lisinopril. - last set of lipids: Lab Results  Component Value Date   CHOL 257* 06/24/2014   HDL 42.40 06/24/2014   LDLCALC 186* 06/24/2014   LDLDIRECT 142.3 03/06/2008   TRIG 142.0 06/24/2014   CHOLHDL 6 06/24/2014  She was on statins but taken off b/c transaminitis in 10/2013. - next eye exam will be in 07/2014. No DR.  - no numbness and tingling in her feet.  I reviewed pt's medications, allergies, PMH, social hx, family hx, and changes were documented in the history of present illness. Otherwise, unchanged from my initial visit note.  ROS: Constitutional: no weight gain, + fatigue, no subjective hyperthermia/hypothermia Eyes: no blurry vision, no xerophthalmia ENT: no sore throat, no nodules palpated in throat, no dysphagia/odynophagia, no hoarseness Cardiovascular: no CP/SOB/palpitations/leg swelling Respiratory: no cough/SOB Gastrointestinal: no N/V/D/C/heartburn Musculoskeletal: no muscle/+ joint aches and swelling Skin: + rash (psoriasis) Neurological: no  Tremors/numbness/tingling/dizziness, + HA  PE: BP 132/80 mmHg  Pulse 90  Temp(Src) 98.5 F (36.9 C) (Oral)  Resp 12  Wt 207 lb 3.2 oz (93.985 kg)  SpO2 97%  Wt Readings from Last 3 Encounters:  10/15/14 207 lb 3.2 oz (93.985 kg)  07/16/14 214 lb (97.07 kg)  06/24/14 213 lb (96.616 kg)   Constitutional: overweight, in NAD Eyes: PERRLA, EOMI, no exophthalmos ENT: moist mucous membranes, no thyromegaly, no cervical lymphadenopathy Cardiovascular: RRR, No MRG Respiratory: CTA B Gastrointestinal: abdomen soft, NT, ND, BS+ Musculoskeletal: no deformities, strength intact in all 4 Skin: moist, warm, no rashes Neurological: no tremor with outstretched hands, DTR normal in all 4  ASSESSMENT: 1. DM2, insulin-dependent, uncontrolled, without complications  PLAN:  1. Patient with long-standing,  uncontrolled diabetes, now with better control on oral antidiabetic regimen + basal insulin, however, worse than at last visits when she had mostly 80s >> we decreased Levemir from 17 to 14 units. She also had a steroid inj and she continues the Prednisone at a lower dose. Will increase the Levemir back to 17 units.  - I suggested to:  Patient Instructions  Please increase Levemir to 17 units at bedtime. Continue: - Metformin  1000 mg 2x a day - Januvia 100 mg daily in am   Please stop at the lab.  Please come back for a follow-up appointment in 3 months  - continue checking sugars at different times of the day - check 1-2 times a day, rotating checks - advised for yearly eye exams >> she is UTD - recheck HbA1c today - Return to clinic in 3 mo with sugar log   Office Visit on 10/15/2014  Component Date Value Ref Range Status  . Hgb A1c MFr Bld 10/15/2014 6.9* 4.6 - 6.5 % Final   Glycemic Control Guidelines for People with Diabetes:Non Diabetic:  <6%Goal of Therapy: <7%Additional Action Suggested:  >8%    excellent A1c!

## 2014-10-15 NOTE — Patient Instructions (Signed)
Please increase Levemir to 17 units at bedtime. Continue: - Metformin  1000 mg 2x a day - Januvia 100 mg daily in am   Please stop at the lab.  Please come back for a follow-up appointment in 3 months

## 2014-11-25 ENCOUNTER — Encounter: Payer: Self-pay | Admitting: Internal Medicine

## 2014-11-25 ENCOUNTER — Ambulatory Visit (INDEPENDENT_AMBULATORY_CARE_PROVIDER_SITE_OTHER): Payer: PRIVATE HEALTH INSURANCE | Admitting: Internal Medicine

## 2014-11-25 VITALS — BP 128/86 | HR 76 | Temp 97.8°F | Wt 204.2 lb

## 2014-11-25 DIAGNOSIS — I10 Essential (primary) hypertension: Secondary | ICD-10-CM | POA: Diagnosis not present

## 2014-11-25 DIAGNOSIS — E785 Hyperlipidemia, unspecified: Secondary | ICD-10-CM | POA: Diagnosis not present

## 2014-11-25 DIAGNOSIS — R195 Other fecal abnormalities: Secondary | ICD-10-CM

## 2014-11-25 NOTE — Progress Notes (Signed)
   Subjective:    Patient ID: Raven [name], female    DOB: 1956-05-24, 59 y.o.   MRN: [id number]  HPI The patient is here to assess status of active health conditions.  PMH, FH, & Social History reviewed & updated.   She has been compliant with her medications without adverse effects. She is on a heart healthy ,no added salt diet. She is unable to exercise. She is tentatively planning to have a total knee replacement on the left knee. She's had this done on the right. Blood pressure is well controlled; she states it averages 108/50. She denies any postural hypotension symptoms.  She has been compliant with her statin. She has no leg cramps on the atorvastatin as she did with pravastatin.   In December 2015 HDL was 44 and LDL 186.   Her major complaint at this time a loose stools which has been an intermittent phenomena for months. She does take her metformin after a full meal. She does have occasional dysphagia which is not significant. Her diabetes is being managed by Dr.Gherghe.    Review of Systems  Significant headaches, epistaxis, chest pain, palpitations, exertional dyspnea, claudication, paroxysmal nocturnal dyspnea, or edema absent. No GI symptoms , memory loss or myalgias      Objective:   Physical Exam  Pertinent or positive findings include: She has a resting tremor of her hands.  Striae are present over the abdomen.  There is dullness percussion of the right upper quadrant.  She has fusiform changes in the knees, particularly on the right. Crepitus is present.  Degenerative reflexes are decreased at the knees, especially the right.  General appearance :adequately nourished; in no distress. Eyes: No conjunctival inflammation or scleral icterus is present. Oral exam:  Lips and gums are healthy appearing.There is no oropharyngeal erythema or exudate noted. Dental hygiene is good. Heart:  Normal rate and regular rhythm. S1 and S2 normal without gallop, murmur,  click, rub or other extra sounds   Lungs:Chest clear to auscultation; no wheezes, rhonchi,rales ,or rubs present.No increased work of breathing.  Abdomen: bowel sounds normal, soft and non-tender without masses, organomegaly or hernias noted.  No guarding or rebound. Vascular : all pulses equal ; no bruits present. Skin:Warm & dry.  Intact without suspicious lesions or rashes ; no tenting or jaundice  Lymphatic: No lymphadenopathy is noted about the head, neck, axilla Neuro: Strength, tone  normal.        Assessment & Plan:  #1 dyslipidemia  #2 hypertension, excellent control  #3 loose stool  See orders and recommendations

## 2014-11-25 NOTE — Assessment & Plan Note (Signed)
Blood pressure goals reviewed. Decrease ACE-I to 1/2 qd

## 2014-11-25 NOTE — Progress Notes (Signed)
Pre visit review using our clinic review tool, if applicable. No additional management support is needed unless otherwise documented below in the visit note. 

## 2014-11-25 NOTE — Assessment & Plan Note (Signed)
Lipids , CK

## 2014-11-25 NOTE — Patient Instructions (Signed)
   Please decrease the lisinopril 20 mg to one half pill daily and monitor blood pressure. Minimal Blood Pressure Goal= AVERAGE < 140/90;  Ideal is an AVERAGE < 135/85. This AVERAGE should be calculated from @ least 5-7 BP readings taken @ different times of day on different days of week. You should not respond to isolated BP readings , but rather the AVERAGE for that week .Please bring your  blood pressure cuff to office visits to verify that it is reliable.It  can also be checked against the blood pressure device at the pharmacy. Finger or wrist cuffs are not dependable; an arm cuff is.  Please take a probiotic , Florastor OR Align, every day if the bowels are loose. This will replace the normal bacteria which  are necessary for formation of normal stool and processing of food.

## 2014-11-26 ENCOUNTER — Other Ambulatory Visit (INDEPENDENT_AMBULATORY_CARE_PROVIDER_SITE_OTHER): Payer: PRIVATE HEALTH INSURANCE

## 2014-11-26 DIAGNOSIS — E785 Hyperlipidemia, unspecified: Secondary | ICD-10-CM | POA: Diagnosis not present

## 2014-11-26 LAB — LIPID PANEL
CHOLESTEROL: 184 mg/dL (ref 0–200)
HDL: 49.9 mg/dL (ref >39.00)
LDL CALC: 114 mg/dL — AB (ref 0–99)
NonHDL: 134.1
TRIGLYCERIDES: 103 mg/dL (ref 0.0–149.0)
Total CHOL/HDL Ratio: 4
VLDL: 20.6 mg/dL (ref 0.0–40.0)

## 2014-11-26 LAB — CK: Total CK: 29 U/L (ref 7–177)

## 2015-01-16 ENCOUNTER — Other Ambulatory Visit (INDEPENDENT_AMBULATORY_CARE_PROVIDER_SITE_OTHER): Payer: PRIVATE HEALTH INSURANCE | Admitting: *Deleted

## 2015-01-16 ENCOUNTER — Ambulatory Visit (INDEPENDENT_AMBULATORY_CARE_PROVIDER_SITE_OTHER): Payer: PRIVATE HEALTH INSURANCE | Admitting: Internal Medicine

## 2015-01-16 ENCOUNTER — Encounter: Payer: Self-pay | Admitting: Internal Medicine

## 2015-01-16 VITALS — BP 102/64 | HR 103 | Temp 98.6°F | Resp 12 | Wt 207.4 lb

## 2015-01-16 DIAGNOSIS — IMO0002 Reserved for concepts with insufficient information to code with codable children: Secondary | ICD-10-CM

## 2015-01-16 DIAGNOSIS — E1165 Type 2 diabetes mellitus with hyperglycemia: Secondary | ICD-10-CM

## 2015-01-16 DIAGNOSIS — Z794 Long term (current) use of insulin: Secondary | ICD-10-CM | POA: Diagnosis not present

## 2015-01-16 LAB — POCT GLYCOSYLATED HEMOGLOBIN (HGB A1C): HEMOGLOBIN A1C: 6.4

## 2015-01-16 MED ORDER — INSULIN DETEMIR 100 UNIT/ML FLEXPEN: 15.0000 [IU] | PEN_INJECTOR | Freq: Every day | SUBCUTANEOUS | Status: DC

## 2015-01-16 MED ORDER — METFORMIN HCL 500 MG PO TABS: 1000.0000 mg | ORAL_TABLET | Freq: Two times a day (BID) | ORAL | Status: DC

## 2015-01-16 NOTE — Progress Notes (Signed)
Patient ID: Raven [name], female   DOB: Feb 09, 1956, 59 y.o.   MRN: [id number]  HPI: Raven [name] is a 59 y.o.-year-old female, returning for f/u for DM2, dx ~1995, insulin-dependent since 11/2013, uncontrolled, without complications. Last visit 3 mo ago.  Last hemoglobin A1c was: Lab Results  Component Value Date   HGBA1C 6.9* 10/15/2014   HGBA1C 6.9* 07/16/2014   HGBA1C 8.6* 04/09/2014   She was on Prednisone 10 mg since 05/12/2014 >> 7.5 mg in last month >> 5 mg >> will decrease to 2.5 mg soon. Was on 15 mg for a long time. She has RA >> on Remicade and MTX. Prednisone not helping much her RA >> started Tramadol. She got a steroid inj. At the end of March >> sugars higher.   Pt is on a regimen of: - Metformin XR 1000 mg po bid - Levemir 17 >> 15 units qhs - Januvia 100 mg daily in am >> started 04/2014  She took Januvia in the past.  She tried Glimepiride in the past >> fluctuating blood sugar.  She is on Prednisone 15 mg daily for RA for the last 12 weeks. She will stay on it indefinitely as of now. She had a taper of Prednisone in June.   Pt checks her sugars 1x a day and they are: - am: 104-203, 235, 258 >> 84-140, 212 (ate late) >> 83-120 (mostly 80s) >> 137-155, 203 >> 111-126, 136 - 2h after b'fast: n/c >> 137 >> 140, 155 >> 156-190 >> n/c - before lunch: n/c >> 84-140 >> 84, 89 >> 120 >> n/c - 2h after lunch: 75 x1 >> 145 >> 129-159 >> 124-150 >> 129-160, 186 - before dinner: n/c >> 82-143 >> 83-86 >> n/c - 2h after dinner: n/c >> 121 >> 120-160 >> 137, 140 >> 130-154, 190 - bedtime: n/c >> 122-150, if dessert >> 213-214 >> 95-105 >> 119, 130 >> n/c - nighttime: n/c No lows. Lowest sugar was 115;  she has hypoglycemia awareness at 80.  Highest sugar was 258 >> 214 >> 160 >> 203 (after steroid shot)  Pt's meals are: - Breakfast: scrambled eggs + bacon + tomatoes - Lunch: meat + 2 veggies + no bread unless burger - Dinner: meat + 2 veggies - Snacks: 2: nuts;  cheese;   - no CKD, last BUN/creatinine:  Lab Results  Component Value Date   BUN 13 06/24/2014   CREATININE 0.7 06/24/2014  On Lisinopril. - last set of lipids: Lab Results  Component Value Date   CHOL 184 11/26/2014   HDL 49.90 11/26/2014   LDLCALC 114* 11/26/2014   LDLDIRECT 142.3 03/06/2008   TRIG 103.0 11/26/2014   CHOLHDL 4 11/26/2014  She was on statins but taken off b/c transaminitis in 10/2013. She was on Atorvastatin after this, now stopped b/c mm cramps. - last eye exam will be in 07/2014. No DR.  - no numbness and tingling in her feet.  I reviewed pt's medications, allergies, PMH, social hx, family hx, and changes were documented in the history of present illness. Otherwise, unchanged from my initial visit note.  ROS: Constitutional: no weight gain, + fatigue, no subjective hyperthermia/hypothermia Eyes: no blurry vision, no xerophthalmia ENT: no sore throat, no nodules palpated in throat, no dysphagia/odynophagia, no hoarseness Cardiovascular: no CP/SOB/palpitations/leg swelling Respiratory: no cough/SOB Gastrointestinal: no N/V/+ D/no C/heartburn Musculoskeletal: no muscle/joint aches and swelling Skin: + rash (psoriasis) Neurological: no  Tremors/numbness/tingling/dizziness, + HA (sinus)  PE: BP 102/64 mmHg  Pulse  103  Temp(Src) 98.6 F (37 C) (Oral)  Resp 12  Wt 207 lb 6.4 oz (94.076 kg)  SpO2 97% Wt Readings from Last 3 Encounters:  01/16/15 207 lb 6.4 oz (94.076 kg)  11/25/14 204 lb 4 oz (92.647 kg)  10/15/14 207 lb 3.2 oz (93.985 kg)   Constitutional: overweight, in NAD Eyes: PERRLA, EOMI, no exophthalmos ENT: moist mucous membranes, no thyromegaly, no cervical lymphadenopathy Cardiovascular: RRR, No MRG Respiratory: CTA B Gastrointestinal: abdomen soft, NT, ND, BS+ Musculoskeletal: no deformities, strength intact in all 4 Skin: moist, warm, no rashes Neurological: no tremor with outstretched hands, DTR normal in all 4  ASSESSMENT: 1. DM2,  insulin-dependent, uncontrolled, without complications  PLAN:  1. Patient with long-standing, uncontrolled diabetes, now with better control on oral antidiabetic regimen + basal insulin, however, worse than at last visits when she had mostly 80s >> we decreased Levemir from 17 to 14 units. She also had a steroid inj and she continues the Prednisone at a lower dose. Will increase the Levemir back to 17 units.  - I suggested to:  Patient Instructions  Please continue Levemir 15 units at bedtime. Continue: - Metformin  1000 mg 2x a day, but switch to the regular type - Januvia 100 mg daily in am   Please come back for a follow-up appointment in 3 months  - continue checking sugars at different times of the day - check 1-2 times a day, rotating checks - advised for yearly eye exams >> she is UTD - recheck HbA1c today >> 6.4%! - Return to clinic in 3 mo with sugar log

## 2015-01-16 NOTE — Patient Instructions (Signed)
Please continue Levemir 15 units at bedtime. Continue: - Metformin  1000 mg 2x a day, but switch to the regular type - Januvia 100 mg daily in am   Please come back for a follow-up appointment in 3 months

## 2015-02-12 ENCOUNTER — Ambulatory Visit (INDEPENDENT_AMBULATORY_CARE_PROVIDER_SITE_OTHER): Payer: PRIVATE HEALTH INSURANCE | Admitting: Podiatry

## 2015-02-12 ENCOUNTER — Ambulatory Visit (INDEPENDENT_AMBULATORY_CARE_PROVIDER_SITE_OTHER): Payer: PRIVATE HEALTH INSURANCE

## 2015-02-12 DIAGNOSIS — M779 Enthesopathy, unspecified: Secondary | ICD-10-CM

## 2015-02-12 DIAGNOSIS — M79671 Pain in right foot: Secondary | ICD-10-CM

## 2015-02-12 DIAGNOSIS — Z0189 Encounter for other specified special examinations: Secondary | ICD-10-CM

## 2015-02-12 MED ORDER — TRIAMCINOLONE ACETONIDE 10 MG/ML IJ SUSP
10.0000 mg | Freq: Once | INTRAMUSCULAR | Status: AC
  Administered 2015-02-12: 10 mg

## 2015-02-12 NOTE — Progress Notes (Signed)
Subjective:     Patient ID: Raven [name], female   DOB: 01-22-1956, 59 y.o.   MRN: [id number]  HPI patient presents stating she has a lot of pain in the outside of her right foot and does not remember injury. It's been worse over the last month and she does have a history of rheumatoid arthritis which may be involved with this   Review of Systems  All other systems reviewed and are negative.      Objective:   Physical Exam  Constitutional: She is oriented to person, place, and time.  Cardiovascular: Intact distal pulses.   Musculoskeletal: Normal range of motion.  Neurological: She is oriented to person, place, and time.  Skin: Skin is warm.  Nursing note and vitals reviewed.  neurovascular status intact muscle strength adequate range of motion is slightly reduced and subtalar midtarsal joint with quite a bit of inflammation at the base of the fifth metatarsal peroneal insertion right. No muscle strength loss was noted to the peroneal. Patient is noted to have good digital perfusion and is well oriented 3     Assessment:     Appears to be more inflammatory tendinitis of the base of fifth metatarsal right with possible rheumatoid involvement but not likely    Plan:     H&P and x-rays reviewed with patient. Today I went ahead and I did a careful sheath injection right 3 Milligan Kenalog 5 mg Xylocaine and advised on heat and ice therapy and dispensed fascial brace to bring up the lateral side of the foot. Reappoint for [place] to recheck again in several weeks

## 2015-02-12 NOTE — Progress Notes (Signed)
   Subjective:    Patient ID: Raven [name], female    DOB: 1956-07-05, 59 y.o.   MRN: [id number]  HPI  Pt presents with right foot pain on lateral side lasting 2 weeks. She states that the pain is achey and the severity worsens upon plantar flexion. She has a h/o rheumatoid and psroriatic arthritis. She has tried aleve BID, ice and heat with no relief  Review of Systems  Musculoskeletal: Positive for back pain and arthralgias.  All other systems reviewed and are negative.      Objective:   Physical Exam        Assessment & Plan:

## 2015-02-26 ENCOUNTER — Encounter: Payer: Self-pay | Admitting: Podiatry

## 2015-02-26 ENCOUNTER — Ambulatory Visit (INDEPENDENT_AMBULATORY_CARE_PROVIDER_SITE_OTHER): Payer: PRIVATE HEALTH INSURANCE | Admitting: Podiatry

## 2015-02-26 VITALS — BP 128/71 | HR 82 | Resp 16

## 2015-02-26 DIAGNOSIS — M779 Enthesopathy, unspecified: Secondary | ICD-10-CM | POA: Diagnosis not present

## 2015-02-26 MED ORDER — TRIAMCINOLONE ACETONIDE 10 MG/ML IJ SUSP
10.0000 mg | Freq: Once | INTRAMUSCULAR | Status: AC
  Administered 2015-02-26: 10 mg

## 2015-02-26 NOTE — Progress Notes (Signed)
Subjective:     Patient ID: Raven [name], female   DOB: 02/01/1956, 59 y.o.   MRN: [id number]  HPI patient states the right foot did quite a bit better but on the outside it is sore again and it seems that the brace helps me but it's not lasting as long   Review of Systems     Objective:   Physical Exam Neurovascular status is intact with continued discomfort around the base the fifth metatarsal peroneal insertion right with no indication of rupture of tendon but inflammation    Assessment:     Tendinitis condition base of fifth metatarsal right with fluid buildup    Plan:     Reinjected the tendon 3 mg Kenalog 5 mg Xylocaine and instructed patient on heat and ice therapy. I then went ahead and scanned for custom orthotics to lift the lateral side of the foot and take pressure off the foot

## 2015-03-25 ENCOUNTER — Ambulatory Visit (INDEPENDENT_AMBULATORY_CARE_PROVIDER_SITE_OTHER): Payer: PRIVATE HEALTH INSURANCE | Admitting: *Deleted

## 2015-03-25 DIAGNOSIS — M779 Enthesopathy, unspecified: Secondary | ICD-10-CM

## 2015-03-25 NOTE — Progress Notes (Signed)
Patient ID: Raven [name], female   DOB: 1955/12/05, 59 y.o.   MRN: [id number] Patient presents for orthotic pick up.  Verbal and written break in and wear instructions given.  Patient will follow up in 4 weeks if symptoms worsen or fail to improve.

## 2015-03-25 NOTE — Patient Instructions (Signed)

## 2015-04-18 ENCOUNTER — Ambulatory Visit (INDEPENDENT_AMBULATORY_CARE_PROVIDER_SITE_OTHER): Payer: PRIVATE HEALTH INSURANCE | Admitting: Internal Medicine

## 2015-04-18 ENCOUNTER — Other Ambulatory Visit (INDEPENDENT_AMBULATORY_CARE_PROVIDER_SITE_OTHER): Payer: PRIVATE HEALTH INSURANCE | Admitting: *Deleted

## 2015-04-18 ENCOUNTER — Encounter: Payer: Self-pay | Admitting: Internal Medicine

## 2015-04-18 VITALS — BP 132/78 | HR 91 | Temp 98.3°F | Resp 12 | Wt 201.8 lb

## 2015-04-18 DIAGNOSIS — IMO0002 Reserved for concepts with insufficient information to code with codable children: Secondary | ICD-10-CM

## 2015-04-18 DIAGNOSIS — E119 Type 2 diabetes mellitus without complications: Secondary | ICD-10-CM

## 2015-04-18 DIAGNOSIS — E1165 Type 2 diabetes mellitus with hyperglycemia: Secondary | ICD-10-CM | POA: Diagnosis not present

## 2015-04-18 DIAGNOSIS — Z794 Long term (current) use of insulin: Secondary | ICD-10-CM

## 2015-04-18 LAB — POCT GLYCOSYLATED HEMOGLOBIN (HGB A1C): Hemoglobin A1C: 6.5

## 2015-04-18 MED ORDER — SITAGLIPTIN PHOSPHATE 100 MG PO TABS: 100.0000 mg | ORAL_TABLET | Freq: Every day | ORAL | Status: DC

## 2015-04-18 MED ORDER — INSULIN DETEMIR 100 UNIT/ML FLEXPEN: 14.0000 [IU] | PEN_INJECTOR | Freq: Every day | SUBCUTANEOUS | Status: DC

## 2015-04-18 NOTE — Patient Instructions (Signed)
Please continue: - Levemir 14 units at bedtime. - Metformin  1000 mg 2x a day, but switch to the regular type - Januvia 100 mg daily in am   Please come back for a follow-up appointment in 4 months.

## 2015-04-18 NOTE — Progress Notes (Signed)
Patient ID: NATALEAH SCIONEAUX, female   DOB: [birth year], 59 y.o.   MRN: [id number]  HPI: MARYJO RAGON is a 59 y.o.-year-old female, returning for f/u for DM2, dx ~1995, insulin-dependent since 11/2013, controlled, without complications. Last visit 3 mo ago.  Last hemoglobin A1c was: Lab Results  Component Value Date   HGBA1C 6.4 01/16/2015   HGBA1C 6.9* 10/15/2014   HGBA1C 6.9* 07/16/2014   She has RA >> on Remicade and MTX. She was on Prednisone 10 mg since 05/12/2014 >> 7.5 mg in last month >> 5 mg >> will decrease to 2.5 mg soon. Was on 15 mg for a long time.  She got a steroid inj. at the end of March >> sugars higher. She had a taper of Prednisone in June. She had a recent taper and is now on 15 mg daily (increased 04/16/2015). Had 2 steroid inj recently.   Pt is on a regimen of: - Metformin XR 1000 mg po bid - Levemir 17 >> 15 >> 14 units qhs - Januvia 100 mg daily in am >> started 04/2014  She tried Glimepiride in the past >> fluctuating blood sugar.  Pt checks her sugars 1x a day and they are: - am: 104-203, 235, 258 >> 84-140, 212 (ate late) >> 83-120 (mostly 80s) >> 137-155, 203 >> 111-126, 136 >> 105-123 - 2h after b'fast: n/c >> 137 >> 140, 155 >> 156-190 >> n/c - before lunch: n/c >> 84-140 >> 84, 89 >> 120 >> n/c - 2h after lunch: 75 x1 >> 145 >> 129-159 >> 124-150 >> 129-160, 186 >> 132-158 - before dinner: n/c >> 82-143 >> 83-86 >> n/c - 2h after dinner: n/c >> 121 >> 120-160 >> 137, 140 >> 130-154, 190 >> 114-177, 185 - bedtime: n/c >> 122-150, if dessert >> 213-214 >> 95-105 >> 119, 130 >> n/c - nighttime: n/c No lows. Lowest sugar was 115;  she has hypoglycemia awareness at 80.  Highest sugar was 258 >> 214 >> 160 >> 203 (after steroid shot)  Meter: ReliOn.  Pt's meals are: - Breakfast: scrambled eggs + bacon + tomatoes - Lunch: meat + 2 veggies + no bread unless burger - Dinner: meat + 2 veggies - Snacks: 2: nuts; cheese;   - no CKD, last  BUN/creatinine:  Lab Results  Component Value Date   BUN 13 06/24/2014   CREATININE 0.7 06/24/2014  On Lisinopril. - last set of lipids: Lab Results  Component Value Date   CHOL 184 11/26/2014   HDL 49.90 11/26/2014   LDLCALC 114* 11/26/2014   LDLDIRECT 142.3 03/06/2008   TRIG 103.0 11/26/2014   CHOLHDL 4 11/26/2014  She was on statins but taken off b/c transaminitis in 10/2013. She was on Atorvastatin after this, now stopped b/c mm cramps. - last eye exam will be in 07/2014. No DR.  - no numbness and tingling in her feet.  I reviewed pt's medications, allergies, PMH, social hx, family hx, and changes were documented in the history of present illness. Otherwise, unchanged from my initial visit note.  ROS: Constitutional: no weight gain, + fatigue, no subjective hyperthermia/hypothermia Eyes: no blurry vision, no xerophthalmia ENT: no sore throat, no nodules palpated in throat, no dysphagia/odynophagia, no hoarseness Cardiovascular: no CP/SOB/palpitations/leg swelling Respiratory: no cough/SOB Gastrointestinal: no N/V/D/C/heartburn Musculoskeletal: no muscle/+ joint aches and swelling (L elbow and foot) Skin: + rash (psoriasis), + hair loss Neurological: no  Tremors/numbness/tingling/dizziness  PE: BP 132/78 mmHg  Pulse 91  Temp(Src) 98.3 F (36.8  C) (Oral)  Resp 12  Wt 201 lb 12.8 oz (91.536 kg)  SpO2 97% Body mass index is 30.69 kg/(m^2).  Wt Readings from Last 3 Encounters:  04/18/15 201 lb 12.8 oz (91.536 kg)  01/16/15 207 lb 6.4 oz (94.076 kg)  11/25/14 204 lb 4 oz (92.647 kg)   Constitutional: overweight, in NAD Eyes: PERRLA, EOMI, no exophthalmos ENT: moist mucous membranes, no thyromegaly, no cervical lymphadenopathy Cardiovascular: RRR, No MRG Respiratory: CTA B Gastrointestinal: abdomen soft, NT, ND, BS+ Musculoskeletal: no deformities, strength intact in all 4 Skin: moist, warm, no rashes Neurological: no tremor with outstretched hands, DTR normal in  all 4  ASSESSMENT: 1. DM2, insulin-dependent, controlled, without complications  PLAN:  1. Patient with long-standing, uncontrolled diabetes, now with great control on oral antidiabetic regimen + basal insulin. She continues steroids for her RA, but the sugars did not increase significantly on Prednisone or with the steroid injections. - I suggested to:  Patient Instructions  Please continue: - Levemir 14 units at bedtime. - Metformin 1000 mg 2x a day - Januvia 100 mg daily in am   Please come back for a follow-up appointment in 4 months  - continue checking sugars at different times of the day - check 1x times a day, rotating checks - advised for yearly eye exams >> she is UTD - will have flu shot with PCP next week. - recheck HbA1c today >> 6.5% (great!) - Return to clinic in 4 mo with sugar log

## 2015-04-21 ENCOUNTER — Ambulatory Visit (INDEPENDENT_AMBULATORY_CARE_PROVIDER_SITE_OTHER): Payer: PRIVATE HEALTH INSURANCE | Admitting: Internal Medicine

## 2015-04-21 ENCOUNTER — Encounter: Payer: Self-pay | Admitting: Internal Medicine

## 2015-04-21 VITALS — BP 125/80 | HR 93 | Temp 99.3°F | Ht 68.0 in | Wt 204.4 lb

## 2015-04-21 DIAGNOSIS — Z0189 Encounter for other specified special examinations: Secondary | ICD-10-CM

## 2015-04-21 DIAGNOSIS — E785 Hyperlipidemia, unspecified: Secondary | ICD-10-CM | POA: Diagnosis not present

## 2015-04-21 DIAGNOSIS — I10 Essential (primary) hypertension: Secondary | ICD-10-CM | POA: Diagnosis not present

## 2015-04-21 DIAGNOSIS — F411 Generalized anxiety disorder: Secondary | ICD-10-CM

## 2015-04-21 DIAGNOSIS — Z1239 Encounter for other screening for malignant neoplasm of breast: Secondary | ICD-10-CM

## 2015-04-21 DIAGNOSIS — Z0000 Encounter for general adult medical examination without abnormal findings: Secondary | ICD-10-CM

## 2015-04-21 DIAGNOSIS — Z23 Encounter for immunization: Secondary | ICD-10-CM | POA: Diagnosis not present

## 2015-04-21 MED ORDER — SERTRALINE HCL 50 MG PO TABS: 75.0000 mg | ORAL_TABLET | Freq: Every day | ORAL | Status: DC

## 2015-04-21 MED ORDER — DIAZEPAM 5 MG PO TABS: 5.0000 mg | ORAL_TABLET | Freq: Two times a day (BID) | ORAL | Status: DC | PRN

## 2015-04-21 MED ORDER — LISINOPRIL 20 MG PO TABS: 10.0000 mg | ORAL_TABLET | Freq: Every day | ORAL | Status: DC

## 2015-04-21 MED ORDER — ROSUVASTATIN CALCIUM 5 MG PO TABS: 5.0000 mg | ORAL_TABLET | Freq: Every day | ORAL | Status: DC

## 2015-04-21 MED ORDER — ATORVASTATIN CALCIUM 20 MG PO TABS: 20.0000 mg | ORAL_TABLET | Freq: Every day | ORAL | Status: DC

## 2015-04-21 NOTE — Patient Instructions (Signed)
  Your next office appointment will be determined based upon review of your pending labs in mid December .  Those written interpretation of the lab results and instructions will be transmitted to you by My Chart   Critical results will be called.   Followup as needed for any active or acute issue. Please report any significant change in your symptoms.

## 2015-04-21 NOTE — Progress Notes (Signed)
Pre visit review using our clinic review tool, if applicable. No additional management support is needed unless otherwise documented below in the visit note. 

## 2015-04-21 NOTE — Progress Notes (Signed)
   Subjective:    Patient ID: Raven [name], female    DOB: 10/26/1955, 59 y.o.   MRN: [id number]  HPI The patient is here for a physical to assess status of active health conditions.  PMH, FH, & Social History reviewed & updated.Her brother had MI @ 80.  She has been compliant with her medications for the most part. She has changed how she takes the atorvastatin 20 mg from Monday, Wednesday, Friday to 6 weeks on and 2 weeks off because of leg cramps. She had similar phenomena with pravastatin as well as simvastatin. She is on a modified heart healthy diet with some red meat. She uses stationary bike 30 minutes 1-4 times per week depending on the severity of her rheumatoid arthritis or psoriatic arthritis symptoms  She has no associated cardio pulmonary symptoms with exercise  She had a colonoscopy in 2015; she is due 2020. She has no active GI symptoms  Her most recent A1c was 6.5% on 04/18/15 despite 3 courses of oral steroids and 2 steroid shots recently. Ophthalmologic exam revealed no retinopathy  She does have some hair loss with a change her methotrexate dose.   Review of Systems  Chest pain, palpitations, tachycardia, exertional dyspnea, paroxysmal nocturnal dyspnea, claudication or edema are absent. No unexplained weight loss, abdominal pain, significant dyspepsia, dysphagia, melena, rectal bleeding, or persistently small caliber stools. Dysuria, pyuria, hematuria, frequency, nocturia or polyuria are denied. Change in skin or nails denied. No bowel changes of constipation or diarrhea. No intolerance to heat or cold.     Objective:   Physical Exam  Pertinent or positive findings include: There is accentuated curvature of the thoracic spine. Right knee reflex is 0+; left is 1/2+. She has intermittent tremor of both hands. She is unable to straighten the left elbow.  General appearance :adequately nourished; in no distress. BMI 31.08  Eyes: No conjunctival inflammation or  scleral icterus is present.  Oral exam:  Lips and gums are healthy appearing.There is no oropharyngeal erythema or exudate noted. Dental hygiene is good.  Heart:  Normal rate and regular rhythm. S1 and S2 normal without gallop, murmur, click, rub or other extra sounds    Lungs:Chest clear to auscultation; no wheezes, rhonchi,rales ,or rubs present.No increased work of breathing.   Abdomen: bowel sounds normal, soft and non-tender without masses, organomegaly or hernias noted.  No guarding or rebound. No flank tenderness to percussion.  Vascular : all pulses equal ; no bruits present.  Skin:Warm & dry.  Intact without suspicious lesions or rashes ; no tenting or jaundice   Lymphatic: No lymphadenopathy is noted about the head, neck, axilla.   Neuro: Strength, tone  normal.      Assessment & Plan:  #1 comprehensive physical exam; no acute findings  Plan: see Orders  & Recommendations

## 2015-04-22 ENCOUNTER — Encounter: Payer: Self-pay | Admitting: Internal Medicine

## 2015-04-24 ENCOUNTER — Encounter: Payer: Self-pay | Admitting: Podiatry

## 2015-04-24 ENCOUNTER — Ambulatory Visit (INDEPENDENT_AMBULATORY_CARE_PROVIDER_SITE_OTHER): Payer: PRIVATE HEALTH INSURANCE | Admitting: Podiatry

## 2015-04-24 ENCOUNTER — Ambulatory Visit (INDEPENDENT_AMBULATORY_CARE_PROVIDER_SITE_OTHER): Payer: PRIVATE HEALTH INSURANCE

## 2015-04-24 VITALS — BP 141/84 | HR 74 | Resp 16

## 2015-04-24 DIAGNOSIS — M779 Enthesopathy, unspecified: Secondary | ICD-10-CM

## 2015-04-24 DIAGNOSIS — M205X2 Other deformities of toe(s) (acquired), left foot: Secondary | ICD-10-CM | POA: Diagnosis not present

## 2015-04-25 NOTE — Progress Notes (Signed)
Subjective:     Patient ID: Raven [name], female   DOB: 13-Sep-1955, 59 y.o.   MRN: [id number]  HPI patient presents stating my left big toe was locking up and was painful and I was concerned about this problem. It is some better now it still can bother me at times   Review of Systems     Objective:   Physical Exam Neurovascular status intact with discomfort mostly at the interphalangeal joint left big toe with reduced motion and mild reduction of motion at the first MPJ left    Assessment:     Mild hallux limitus deformity with interphalangeal capsulitis and possible arthritis occurring    Plan:     H&P and x-rays reviewed with patient. I've recommended continued orthotic usage range of motion exercises and if symptoms were to get worse or become a problem we will need to consider treatment

## 2015-04-28 ENCOUNTER — Telehealth: Payer: Self-pay

## 2015-04-28 NOTE — Telephone Encounter (Signed)
PCP filled out paper work on the day of LOV: 04/24/2015.   This has been mailed to both the patient as well as to: [address] [address] [place], [place] [address] Attn: Blase Mess ([employer]).   Copy is in the pt file.

## 2015-07-10 ENCOUNTER — Other Ambulatory Visit: Payer: Self-pay | Admitting: *Deleted

## 2015-07-10 MED ORDER — INSULIN DETEMIR 100 UNIT/ML FLEXPEN: 14.0000 [IU] | PEN_INJECTOR | Freq: Every day | SUBCUTANEOUS | Status: DC

## 2015-07-15 ENCOUNTER — Encounter: Payer: Self-pay | Admitting: Family Medicine

## 2015-07-15 ENCOUNTER — Ambulatory Visit (INDEPENDENT_AMBULATORY_CARE_PROVIDER_SITE_OTHER): Payer: PRIVATE HEALTH INSURANCE | Admitting: Family Medicine

## 2015-07-15 VITALS — BP 115/85 | HR 73 | Temp 97.9°F | Resp 16 | Ht 67.25 in | Wt 191.5 lb

## 2015-07-15 DIAGNOSIS — Z79899 Other long term (current) drug therapy: Secondary | ICD-10-CM | POA: Diagnosis not present

## 2015-07-15 DIAGNOSIS — E119 Type 2 diabetes mellitus without complications: Secondary | ICD-10-CM

## 2015-07-15 DIAGNOSIS — I10 Essential (primary) hypertension: Secondary | ICD-10-CM

## 2015-07-15 DIAGNOSIS — Z23 Encounter for immunization: Secondary | ICD-10-CM

## 2015-07-15 DIAGNOSIS — E785 Hyperlipidemia, unspecified: Secondary | ICD-10-CM | POA: Diagnosis not present

## 2015-07-15 DIAGNOSIS — F411 Generalized anxiety disorder: Secondary | ICD-10-CM

## 2015-07-15 LAB — LIPID PANEL
CHOLESTEROL: 179 mg/dL (ref 0–200)
HDL: 44.3 mg/dL (ref >39.00)
LDL Cholesterol: 120 mg/dL — ABNORMAL HIGH (ref 0–99)
NonHDL: 134.91
Total CHOL/HDL Ratio: 4
Triglycerides: 77 mg/dL (ref 0.0–149.0)
VLDL: 15.4 mg/dL (ref 0.0–40.0)

## 2015-07-15 LAB — COMPREHENSIVE METABOLIC PANEL
ALBUMIN: 4.1 g/dL (ref 3.5–5.2)
ALK PHOS: 42 U/L (ref 39–117)
ALT: 19 U/L (ref 0–35)
AST: 14 U/L (ref 0–37)
BUN: 16 mg/dL (ref 6–23)
CO2: 27 mEq/L (ref 19–32)
CREATININE: 0.73 mg/dL (ref 0.40–1.20)
Calcium: 8.9 mg/dL (ref 8.4–10.5)
Chloride: 102 mEq/L (ref 96–112)
GFR: 86.42 mL/min (ref >60.00)
Glucose, Bld: 89 mg/dL (ref 70–99)
Potassium: 4.4 mEq/L (ref 3.5–5.1)
SODIUM: 139 meq/L (ref 135–145)
TOTAL PROTEIN: 6.9 g/dL (ref 6.0–8.3)
Total Bilirubin: 0.3 mg/dL (ref 0.2–1.2)

## 2015-07-15 LAB — CBC WITH DIFFERENTIAL/PLATELET
Basophils Absolute: 0.1 10*3/uL (ref 0.0–0.1)
Basophils Relative: 1 % (ref 0.0–3.0)
EOS ABS: 0.3 10*3/uL (ref 0.0–0.7)
EOS PCT: 2.9 % (ref 0.0–5.0)
HCT: 35.9 % — ABNORMAL LOW (ref 36.0–46.0)
HEMOGLOBIN: 11.4 g/dL — AB (ref 12.0–15.0)
Lymphocytes Relative: 40.2 % (ref 12.0–46.0)
Lymphs Abs: 3.9 10*3/uL (ref 0.7–4.0)
MCHC: 31.6 g/dL (ref 30.0–36.0)
MCV: 84.1 fl (ref 78.0–100.0)
MONO ABS: 0.8 10*3/uL (ref 0.1–1.0)
Monocytes Relative: 8.6 % (ref 3.0–12.0)
Neutro Abs: 4.6 10*3/uL (ref 1.4–7.7)
Neutrophils Relative %: 47.3 % (ref 43.0–77.0)
Platelets: 316 10*3/uL (ref 150.0–400.0)
RBC: 4.26 Mil/uL (ref 3.87–5.11)
RDW: 18.1 % — ABNORMAL HIGH (ref 11.5–15.5)
WBC: 9.8 10*3/uL (ref 4.0–10.5)

## 2015-07-15 MED ORDER — ROSUVASTATIN CALCIUM 5 MG PO TABS: 5.0000 mg | ORAL_TABLET | Freq: Every day | ORAL | Status: DC

## 2015-07-15 MED ORDER — LISINOPRIL 20 MG PO TABS: 10.0000 mg | ORAL_TABLET | Freq: Every day | ORAL | Status: DC

## 2015-07-15 MED ORDER — DIAZEPAM 5 MG PO TABS: 5.0000 mg | ORAL_TABLET | Freq: Two times a day (BID) | ORAL | Status: DC | PRN

## 2015-07-15 MED ORDER — SERTRALINE HCL 50 MG PO TABS: 75.0000 mg | ORAL_TABLET | Freq: Every day | ORAL | Status: DC

## 2015-07-15 NOTE — Progress Notes (Signed)
Pre visit review using our clinic review tool, if applicable. No additional management support is needed unless otherwise documented below in the visit note. 

## 2015-07-15 NOTE — Progress Notes (Signed)
Office Note 07/15/2015  CC:  Chief Complaint  Patient presents with  . Establish Care  . Follow-up    HCL. Pt is fasting.     HPI:  Raven [name] is a 60 y.o. White female who is here to establish/transfer care. 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  Most recent CPE was 04/21/2015 with Dr. Alwyn Ren.  Chronic probs with rheum/psoriatic arth, currently on 10mg  prednisone daily along with methotrexat weekly and simponi SQ q4 wks (pt self injects).  Sees Dr. , rheum.  DM: fasting avg 125--sees Dr. Nickola Major ([facility]).  BP's: 130s over 80s lately.    Just started on crestor 5mg  qd about 3 months ago.  No side effects at all.  Migraines: occur rarely and when they do occur, diazepam helps.  She says 30 diazepam last about 2 yrs-needs RF of this med today.   Past Medical History  Diagnosis Date  . Hyperlipidemia   . Migraine   . Hypertension   . Acute medial meniscus tear of right knee   . GERD (gastroesophageal reflux disease) per pt watches diet and take tums as needed  . Diabetes mellitus oral meds  . Anxiety   . Seasonal allergies   . Interstitial cystitis   . H/O hiatal hernia   . Lichen sclerosus of female genitalia   . Acute rheumatoid arthritis (HCC)     Hands, knees, jaw, elbow  . Psoriatic arthritis [facility])     Past Surgical History  Procedure Laterality Date  . Tubal ligation  1981  . Breast biopsy  2001    fibrocystic breast disease  . Abdominal hysterectomy  1991    for fibroids; ovaries still in  . Cystoscopy  2004    for chronic UTI  . Knee arthroscopy  10/21/2011    Procedure: ARTHROSCOPY KNEE;  Surgeon: 2005, MD;  Location: [facility];  Service: Orthopedics;  Laterality: Right;  WITH MEDIAL and lateral shaving of femoral chondyl  with microfracture technique of lateral and medial femoral chondyl suprapatellar synovectomy   . Intraarticular steroid injection  2013    3 R knee & L X 1; Dr [facility]  . Knee arthrocentesis  2013    R knee x 3 & L X 1  . Total knee arthroplasty  04/12/2012    Procedure: TOTAL KNEE ARTHROPLASTY;  Surgeon: 2014, MD;  Location: [facility];  Service: Orthopedics;  Laterality: Right;  Right Total Knee Arthroplasty  . Esophagogastroduodenoscopy  2012    Nl except antral gastritis: bx = mild chron gastritis (H pyloria neg)   . Colonoscopy  2015    Recall 5 yrs--check on this    Family History  Problem Relation Age of Onset  . Diabetes Mother   . Stroke Mother 3  . Stroke Brother 1  . Heart disease Father     CABG  . Lung cancer Father   . Prostate cancer Father   . Pancreatic cancer Father   . Pancreatic cancer Paternal Grandfather   . Bipolar disorder Daughter   . Anxiety disorder Son   . Osteoarthritis Mother   . Arthritis Maternal Grandmother     rheumatoid  . Heart attack Brother 27    smoker    Social History   Social History  . Marital Status: Married    Spouse Name: N/A  . Number of Children: N/A  . Years  of Education: N/A   Occupational History  . Not on file.   Social History Main Topics  . Smoking status: Never Smoker   . Smokeless tobacco: Never Used  . Alcohol Use: No  . Drug Use: No  . Sexual Activity: Not on file   Other Topics Concern  . Not on file   Social History Narrative   Married, 3 children (2 in [place], 1 in [place]).  4 grandchildren.   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)   No tob/alc/drugs.   2 cups of coffee/day x 3 months   Regular exercise- no-due to arthritis.   Religion affecting care, "it allows stress management:"    Outpatient Encounter Prescriptions as of 07/15/2015  Medication Sig  . cetirizine (ZYRTEC) 10 MG tablet Take 10 mg by mouth every evening.   . diazepam (VALIUM) 5 MG tablet Take 1 tablet (5 mg total) by mouth every 12 (twelve) hours as needed. Migraine headaches  . FOLIC ACID PO Take  3 mg by mouth daily.  Marland Kitchen glucose blood (ONE TOUCH ULTRA TEST) test strip Check blood sugar daily dx: 250.02  . Golimumab (SIMPONI [place]) Inject 50 mg into the skin.  . Insulin Detemir (LEVEMIR) 100 UNIT/ML Pen Inject 14 Units into the skin daily at 10 pm.  . lisinopril (PRINIVIL,ZESTRIL) 20 MG tablet Take 0.5 tablets (10 mg total) by mouth daily.  . metFORMIN (GLUCOPHAGE) 500 MG tablet Take 2 tablets (1,000 mg total) by mouth 2 (two) times daily with a meal.  . Methotrexate Sodium (METHOTREXATE PO) Inject 0.4 mg as directed every Wednesday.  . Omega-3 Fatty Acids (FISH OIL) 1000 MG CAPS Take by mouth 2 (two) times daily.  Letta Pate DELICA LANCETS FINE MISC 1 each by Does not apply route daily. Dx:250.02  . predniSONE (DELTASONE) 5 MG tablet Take 10 mg by mouth daily. Take 7.5 mg by mouth daily with breakfast x 1 month then 5mg   . primidone (MYSOLINE) 50 MG tablet Take 50 mg by mouth. 3 by mouth at bedtime  . rosuvastatin (CRESTOR) 5 MG tablet Take 1 tablet (5 mg total) by mouth daily.  . sertraline (ZOLOFT) 50 MG tablet Take 1.5 tablets (75 mg total) by mouth daily with breakfast.  . sitaGLIPtin (JANUVIA) 100 MG tablet Take 1 tablet (100 mg total) by mouth daily.  . traMADol (ULTRAM) 50 MG tablet Take 50 mg by mouth. 2-3 by mouth at bedtime as needed  . triamcinolone (KENALOG) 0.025 % ointment Apply 1 application topically as needed.  . [DISCONTINUED] diazepam (VALIUM) 5 MG tablet Take 1 tablet (5 mg total) by mouth every 12 (twelve) hours as needed. Migraine headaches  . [DISCONTINUED] InFLIXimab (REMICADE IV) Inject into the vein. Reported on 07/15/2015   No facility-administered encounter medications on file as of 07/15/2015.    Allergies  Allergen Reactions  . Penicillins Rash  . Pravastatin     Myalgias  . Simvastatin     Myalgias  . Hydrocodone-Acetaminophen Nausea And Vomiting    ROS Review of Systems  Constitutional: Negative for fever and fatigue.  HENT: Negative for congestion  and sore throat.   Eyes: Negative for visual disturbance.  Respiratory: Negative for cough.   Cardiovascular: Negative for chest pain.  Gastrointestinal: Negative for nausea and abdominal pain.  Genitourinary: Negative for dysuria.  Musculoskeletal: Negative for back pain.  Skin: Negative for pallor.  Neurological: Negative for weakness and headaches.  Hematological: Negative for adenopathy.    PE; Blood pressure 115/85, pulse 73, temperature 97.9 F (  36.6 C), temperature source Oral, resp. rate 16, height 5' 7.25" (1.708 m), weight 191 lb 8 oz (86.864 kg), SpO2 97 %. Gen: Alert, well appearing.  Patient is oriented to person, place, time, and situation. AFFECT: pleasant, lucid thought and speech. VVO:HYWV: no injection, icteris, swelling, or exudate.  EOMI, PERRLA. Mouth: lips without lesion/swelling.  Oral mucosa pink and moist. Oropharynx without erythema, exudate, or swelling.  Neck - No masses or thyromegaly or limitation in range of motion CV: RRR, no m/r/g.   LUNGS: CTA bilat, nonlabored resps, good aeration in all lung fields. EXT: no clubbing, cyanosis, or edema.   Pertinent labs:  Lab Results  Component Value Date   TSH 1.64 06/24/2014   Lab Results  Component Value Date   WBC 10.6* 11/23/2013   HGB 13.1 11/23/2013   HCT 39.9 11/23/2013   MCV 86.8 11/23/2013   PLT 250.0 11/23/2013   Lab Results  Component Value Date   CREATININE 0.7 06/24/2014   BUN 13 06/24/2014   NA 136 06/24/2014   K 4.7 06/24/2014   CL 103 06/24/2014   CO2 25 06/24/2014   Lab Results  Component Value Date   ALT 34 06/24/2014   AST 21 06/24/2014   ALKPHOS 42 06/24/2014   BILITOT 0.4 06/24/2014   Lab Results  Component Value Date   CHOL 184 11/26/2014   Lab Results  Component Value Date   HDL 49.90 11/26/2014   Lab Results  Component Value Date   LDLCALC 114* 11/26/2014   Lab Results  Component Value Date   TRIG 103.0 11/26/2014   Lab Results  Component Value Date    CHOLHDL 4 11/26/2014   Lab Results  Component Value Date   HGBA1C 6.5 04/18/2015   ASSESSMENT AND PLAN:   1) HTN; The current medical regimen is effective;  continue present plan and medications. Check lytes/cr today.  2) Hyperlipidemia: started crestor 3 mo ago and tolerating 5mg  qd dosing well. Check FLP and AST/ALT today.  3) High risk med use (her rheum/psor arth drugs): CBC, CMET today.  4) DM 2, well controlled.  Management as per endo.  5) Migraine HA's, well controlled.  Good abortive results with diazepam historically so I won't change anything, esp since #30 last her so long.  RF'd #30 of these pills today, no RF.  6) GI: got colonoscopy about 2015 and per Dr. Frederik Pear last note she needs repeat 2020 but pt recalls no hx of polyps and says she doesn't remember being told when to get next colonoscopy.  Will get [facility] records.  Flu vaccine given today  An After Visit Summary was printed and given to the patient.   Return in about 6 months (around 01/12/2016) for routine chronic illness f/u-fasting.

## 2015-07-15 NOTE — Addendum Note (Signed)
Addended by: Westley Hummer on: 07/15/2015 09:10 AM   Modules accepted: Orders

## 2015-08-04 ENCOUNTER — Other Ambulatory Visit: Payer: Self-pay | Admitting: *Deleted

## 2015-08-04 DIAGNOSIS — E785 Hyperlipidemia, unspecified: Secondary | ICD-10-CM

## 2015-08-04 MED ORDER — ROSUVASTATIN CALCIUM 10 MG PO TABS: 10.0000 mg | ORAL_TABLET | Freq: Every day | ORAL | Status: DC

## 2015-08-04 NOTE — Telephone Encounter (Signed)
Dose was suppose to be increased at last refill. Rx recent with correct dose change made.

## 2015-08-19 ENCOUNTER — Ambulatory Visit (INDEPENDENT_AMBULATORY_CARE_PROVIDER_SITE_OTHER): Payer: PRIVATE HEALTH INSURANCE | Admitting: Internal Medicine

## 2015-08-19 ENCOUNTER — Other Ambulatory Visit (INDEPENDENT_AMBULATORY_CARE_PROVIDER_SITE_OTHER): Payer: PRIVATE HEALTH INSURANCE | Admitting: *Deleted

## 2015-08-19 ENCOUNTER — Encounter: Payer: Self-pay | Admitting: Internal Medicine

## 2015-08-19 VITALS — BP 120/72 | HR 87 | Temp 98.4°F | Resp 12 | Wt 196.4 lb

## 2015-08-19 DIAGNOSIS — Z794 Long term (current) use of insulin: Secondary | ICD-10-CM | POA: Diagnosis not present

## 2015-08-19 DIAGNOSIS — IMO0002 Reserved for concepts with insufficient information to code with codable children: Secondary | ICD-10-CM

## 2015-08-19 DIAGNOSIS — E1165 Type 2 diabetes mellitus with hyperglycemia: Secondary | ICD-10-CM

## 2015-08-19 LAB — POCT GLYCOSYLATED HEMOGLOBIN (HGB A1C): Hemoglobin A1C: 6.2

## 2015-08-19 NOTE — Progress Notes (Signed)
Patient ID: Raven [name], female   DOB: 02-03-1956, 60 y.o.   MRN: [id number]  HPI: Raven [name] is a 60 y.o.-year-old female, returning for f/u for DM2, dx ~1995, insulin-dependent since 11/2013, controlled, without complications. Last visit 4 mo ago.  She lost 8 lbs!  Last hemoglobin A1c was: Lab Results  Component Value Date   HGBA1C 6.5 04/18/2015   HGBA1C 6.4 01/16/2015   HGBA1C 6.9* 10/15/2014   She has RA >> on Simponi (changed from Remicade) and MTX. She was on Prednisone 10 mg since 05/12/2014 >> 7.5 mg in last month >> 5 mg >> will decrease to 2.5 mg soon. Was on 15 mg for a long time.  She got a steroid inj. at the end of March >> sugars higher. She had a taper of Prednisone in June. Had 2 steroid inj and 2 Prednisone tapers since last visit and had prednisone. Now on 5 mg Prednisone daily. Will likely need to stay on this long term.  Pt is on a regimen of: - Metformin XR 1000 mg po bid - Levemir 14 units qhs - Januvia 100 mg daily in am >> started 04/2014  She tried Glimepiride in the past >> fluctuating blood sugar.  Pt checks her sugars 1x a day and they are: - am: 84-140, 212 (ate late) >> 83-120 (mostly 80s) >> 137-155, 203 >> 111-126, 136 >> 105-123 >> 94-110 - 2h after b'fast: n/c >> 137 >> 140, 155 >> 156-190 >> n/c - before lunch: n/c >> 84-140 >> 84, 89 >> 120 >> n/c - 2h after lunch: 75 x1 >> 145 >> 129-159 >> 124-150 >> 129-160, 186 >> 132-158 >> 128 - before dinner: n/c >> 82-143 >> 83-86 >> n/c - 2h after dinner: n/c >> 121 >> 120-160 >> 137, 140 >> 130-154, 190 >> 114-177, 185 >> 145-170, 182 - bedtime: n/c >> 122-150, if dessert >> 213-214 >> 95-105 >> 119, 130 >> n/c - nighttime: n/c No lows. Lowest sugar was 115 >> 94;  she has hypoglycemia awareness at 80.  Highest sugar was 258 >> 214 >> 160 >> 203 (after steroid shot) >> 182  Meter: ReliOn.  Pt's meals are: - Breakfast: scrambled eggs + bacon + tomatoes - Lunch: meat + 2 veggies + no  bread unless burger - Dinner: meat + 2 veggies - Snacks: 2: nuts; cheese;   - no CKD, last BUN/creatinine:  Lab Results  Component Value Date   BUN 16 07/15/2015   CREATININE 0.73 07/15/2015  On Lisinopril. - last set of lipids: Lab Results  Component Value Date   CHOL 179 07/15/2015   HDL 44.30 07/15/2015   LDLCALC 120* 07/15/2015   LDLDIRECT 142.3 03/06/2008   TRIG 77.0 07/15/2015   CHOLHDL 4 07/15/2015  She was on statins but taken off b/c transaminitis in 10/2013. She was on Atorvastatin after this, now stopped b/c mm cramps. Now Crestor 10 mg >> no mm aches.  - last eye exam will be in 07/2014. No DR.  - no numbness and tingling in her feet.  I reviewed pt's medications, allergies, PMH, social hx, family hx, and changes were documented in the history of present illness. Otherwise, unchanged from my initial visit note.  ROS: Constitutional: no weight gain, no fatigue, no subjective hyperthermia/hypothermia Eyes: no blurry vision, no xerophthalmia ENT: no sore throat, no nodules palpated in throat, no dysphagia/odynophagia, no hoarseness Cardiovascular: no CP/SOB/palpitations/leg swelling Respiratory: no cough/SOB Gastrointestinal: no N/V/D/C/heartburn Musculoskeletal: no muscle/+ joint aches and  swelling (L elbow and foot) Skin: + rash (psoriasis), + hair loss Neurological: no  Tremors/numbness/tingling/dizziness  PE: BP 120/72 mmHg  Pulse 87  Temp(Src) 98.4 F (36.9 C) (Oral)  Resp 12  Wt 196 lb 6.4 oz (89.086 kg)  SpO2 97% Body mass index is 30.54 kg/(m^2).  Wt Readings from Last 3 Encounters:  08/19/15 196 lb 6.4 oz (89.086 kg)  07/15/15 191 lb 8 oz (86.864 kg)  04/21/15 204 lb 6 oz (92.704 kg)   Constitutional: overweight, in NAD Eyes: PERRLA, EOMI, no exophthalmos ENT: moist mucous membranes, no thyromegaly, no cervical lymphadenopathy Cardiovascular: RRR, No MRG Respiratory: CTA B Gastrointestinal: abdomen soft, NT, ND, BS+ Musculoskeletal: no  deformities, strength intact in all 4 Skin: moist, warm, no rashes Neurological: no tremor with outstretched hands, DTR normal in all 4  ASSESSMENT: 1. DM2, insulin-dependent, controlled, without complications  PLAN:  1. Patient with long-standing, uncontrolled diabetes, now with great control on oral antidiabetic regimen + basal insulin. She continues steroids for her RA, but the sugars did not increase significantly on Prednisone or with the steroid injections. - I suggested to:  Patient Instructions  Please continue: - Levemir 14 units at bedtime. - Metformin 1000 mg 2x a day - Januvia 100 mg daily in am   Please come back for a follow-up appointment in 4 months  - continue checking sugars at different times of the day - check 1x times a day, rotating checks - advised for yearly eye exams >> she is UTD - recheck HbA1c today >> 6.2% (great!) - Return to clinic in 4 mo with sugar log

## 2015-08-19 NOTE — Patient Instructions (Addendum)
Please continue: - Levemir 14 units at bedtime. - Metformin  1000 mg 2x a day - Januvia 100 mg daily in am   Please come back for a follow-up appointment in 4 months.

## 2015-10-06 ENCOUNTER — Encounter: Payer: Self-pay | Admitting: Family Medicine

## 2015-10-31 ENCOUNTER — Ambulatory Visit (INDEPENDENT_AMBULATORY_CARE_PROVIDER_SITE_OTHER): Payer: PRIVATE HEALTH INSURANCE | Admitting: Family Medicine

## 2015-10-31 ENCOUNTER — Encounter: Payer: Self-pay | Admitting: Family Medicine

## 2015-10-31 VITALS — BP 108/74 | HR 69 | Temp 98.5°F | Resp 20 | Wt 196.0 lb

## 2015-10-31 DIAGNOSIS — R350 Frequency of micturition: Secondary | ICD-10-CM | POA: Diagnosis not present

## 2015-10-31 DIAGNOSIS — R319 Hematuria, unspecified: Secondary | ICD-10-CM | POA: Diagnosis not present

## 2015-10-31 DIAGNOSIS — N390 Urinary tract infection, site not specified: Secondary | ICD-10-CM | POA: Diagnosis not present

## 2015-10-31 LAB — POC URINALSYSI DIPSTICK (AUTOMATED)
Bilirubin, UA: NEGATIVE
GLUCOSE UA: NEGATIVE
Ketones, UA: NEGATIVE
NITRITE UA: POSITIVE
Protein, UA: NEGATIVE
Spec Grav, UA: 1.02
UROBILINOGEN UA: 0.2
pH, UA: 5.5

## 2015-10-31 MED ORDER — CEPHALEXIN 500 MG PO CAPS: 500.0000 mg | ORAL_CAPSULE | Freq: Four times a day (QID) | ORAL | Status: DC

## 2015-10-31 MED ORDER — CEFTRIAXONE SODIUM 1 G IJ SOLR
1.0000 g | Freq: Once | INTRAMUSCULAR | Status: AC
  Administered 2015-10-31: 1 g via INTRAMUSCULAR

## 2015-10-31 MED ORDER — PHENAZOPYRIDINE HCL 100 MG PO TABS: 100.0000 mg | ORAL_TABLET | Freq: Three times a day (TID) | ORAL | Status: DC | PRN

## 2015-10-31 NOTE — Progress Notes (Signed)
Patient ID: Raven [name], female   DOB: April 12, 1956, 60 y.o.   MRN: [id number]    Raven [name] , 17-Mar-1956, 60 y.o., female MRN: [id number]  CC:  Urinary frequency  Subjective: Pt presents for an acute OV with complaints of urine frequency of 3 weeks duration. Associated symptoms include fever, chills and back pain 3 weeks ago. She was seen in urgent care, and treated with erythromycin for a few days, and then called from the urgent care and change to Bactrim. He stated they only prescribed her another 3 days of Bactrim. She states she originally thought she became better, but then she had noticed some fatigue, cloudy urine and left-sided low back pain again, that started 3 days ago. Patient denies current fever, chills, nausea or vomit. He does admit to urinary frequency, incomplete emptying of bladder and suprapubic pressure.   Allergies  Allergen Reactions  . Penicillins Rash  . Pravastatin     Myalgias  . Simvastatin     Myalgias  . Hydrocodone-Acetaminophen Nausea And Vomiting   Social History  Substance Use Topics  . Smoking status: Never Smoker   . Smokeless tobacco: Never Used  . Alcohol Use: No   Past Medical History  Diagnosis Date  . Hyperlipidemia   . Migraine   . Hypertension   . Acute medial meniscus tear of right knee   . GERD (gastroesophageal reflux disease) per pt watches diet and take tums as needed  . Diabetes mellitus oral meds  . Anxiety   . Seasonal allergies   . Interstitial cystitis   . H/O hiatal hernia   . Lichen sclerosus of female genitalia   . Seropositive rheumatoid arthritis (HCC)     Hands, knees, jaw, elbow: responding to Simponi as of 09/29/15 rheum f/u.  Chronic low-dose prednisone therapy.  . Psoriatic arthritis [facility])    Past Surgical History  Procedure Laterality Date  . Tubal ligation  1981  . Breast biopsy  2001    fibrocystic breast disease  . Abdominal hysterectomy  1991    for fibroids; ovaries still in  . Cystoscopy   2004    for chronic UTI  . Knee arthroscopy  10/21/2011    Procedure: ARTHROSCOPY KNEE;  Surgeon: Jacki Cones, MD;  Location: [facility];  Service: Orthopedics;  Laterality: Right;  WITH MEDIAL and lateral shaving of femoral chondyl  with microfracture technique of lateral and medial femoral chondyl suprapatellar synovectomy  . Intraarticular steroid injection  2013    3 R knee & L X 1; Dr Netta Corrigan  . Knee arthrocentesis  2013    R knee x 3 & L X 1  . Total knee arthroplasty  04/12/2012    Procedure: TOTAL KNEE ARTHROPLASTY;  Surgeon: Jacki Cones, MD;  Location: [facility];  Service: Orthopedics;  Laterality: Right;  Right Total Knee Arthroplasty  . Esophagogastroduodenoscopy  2012    Nl except antral gastritis: bx = mild chron gastritis (H pyloria neg)   . Colonoscopy  2015    Recall 5 yrs--check on this   Family History  Problem Relation Age of Onset  . Diabetes Mother   . Stroke Mother 8  . Stroke Brother 76  . Heart disease Father     CABG  . Lung cancer Father   . Prostate cancer Father   . Pancreatic cancer Father   . Pancreatic cancer Paternal Grandfather   . Bipolar disorder Daughter   . Anxiety disorder Son   .  Osteoarthritis Mother   . Arthritis Maternal Grandmother     rheumatoid  . Heart attack Brother 11    smoker     Medication List       This list is accurate as of: 10/31/15 11:16 AM.  Always use your most recent med list.               cetirizine 10 MG tablet  Commonly known as:  ZYRTEC  Take 10 mg by mouth every evening.     diazepam 5 MG tablet  Commonly known as:  VALIUM  Take 1 tablet (5 mg total) by mouth every 12 (twelve) hours as needed. Migraine headaches     Fish Oil 1000 MG Caps  Take by mouth 2 (two) times daily.     FOLIC ACID PO  Take 3 mg by mouth daily.     glucose blood test strip  Commonly known as:  ONE TOUCH ULTRA TEST  Check blood sugar daily dx: 250.02     Insulin Detemir 100 UNIT/ML Pen  Commonly  known as:  LEVEMIR  Inject 14 Units into the skin daily at 10 pm.     lisinopril 20 MG tablet  Commonly known as:  PRINIVIL,ZESTRIL  Take 0.5 tablets (10 mg total) by mouth daily.     metFORMIN 500 MG tablet  Commonly known as:  GLUCOPHAGE  Take 2 tablets (1,000 mg total) by mouth 2 (two) times daily with a meal.     METHOTREXATE PO  Inject 0.4 mg as directed every Wednesday.     ONETOUCH DELICA LANCETS FINE Misc  1 each by Does not apply route daily. Dx:250.02     predniSONE 5 MG tablet  Commonly known as:  DELTASONE  Take 10 mg by mouth daily. Take 7.5 mg by mouth daily with breakfast x 1 month then 5mg      primidone 50 MG tablet  Commonly known as:  MYSOLINE  Take 50 mg by mouth. 3 by mouth at bedtime     ranitidine 75 MG tablet  Commonly known as:  ZANTAC  Take 75 mg by mouth 2 (two) times daily.     rosuvastatin 10 MG tablet  Commonly known as:  CRESTOR  Take 1 tablet (10 mg total) by mouth daily.     sertraline 50 MG tablet  Commonly known as:  ZOLOFT  Take 1.5 tablets (75 mg total) by mouth daily with breakfast.     SIMPONI [place]  Inject 50 mg into the skin.     sitaGLIPtin 100 MG tablet  Commonly known as:  JANUVIA  Take 1 tablet (100 mg total) by mouth daily.     traMADol 50 MG tablet  Commonly known as:  ULTRAM  Take 50 mg by mouth. Reported on 10/31/2015     triamcinolone 0.025 % ointment  Commonly known as:  KENALOG  Apply 1 application topically as needed.       ROS: Negative, with the exception of above mentioned in HPI  Objective:  BP 108/74 mmHg  Pulse 69  Temp(Src) 98.5 F (36.9 C) (Oral)  Resp 20  Wt 196 lb (88.905 kg)  SpO2 99% Body mass index is 30.48 kg/(m^2). Gen: Afebrile. No acute distress. Nontoxic in appearance, well-developed, well-nourished, very pleasant Caucasian female. HENT: AT. [place].MMM, no oral lesions.  Eyes:Pupils Equal Round Reactive to light, Extraocular movements intact,  Conjunctiva without redness, discharge or  icterus. CV: RRR Abd: Soft. Round. ND. suprapubic pressure present. BS present. No Masses palpated.  No rebound or guarding. MSK: No CVA tenderness bilaterally. Skin: No rashes, purpura or petechiae.  Neuro: Normal gait. PERLA. EOMi. Alert. Oriented x3   Assessment/Plan: Raven [name] is a 60 y.o. female present for acute OV for  Urinary frequency/Urinary tract infection with hematuria, site unspecified - POCT Urinalysis Dipstick (Automated): Positive blood, positive nitrates, positive leukocytes. - Urine Culture sent - Discussed antibiotic and coverage with patient today. Patient is penicillin allergic without anaphylaxis just rash, but has taken Keflex in the past. - discussed with patient palate nephritis in my concern that her likely pyelonephritis from 3 weeks ago was only partially treated. - Patient amendable to Rocephin IM injection today, and Keflex 500 mg 4 times a day for 7 days. Patient encouraged if her symptoms worsen over the weekend she needs to be seen immediately in the emergency room, at that time would encourage her to have imaging studies completed. - cephALEXin (KEFLEX) 500 MG capsule; Take 1 capsule (500 mg total) by mouth 4 (four) times daily.  Dispense: 28 capsule; Refill: 0 - phenazopyridine (PYRIDIUM) 100 MG tablet; Take 1 tablet (100 mg total) by mouth 3 (three) times daily as needed for pain.  Dispense: 10 tablet; Refill: 0 - cefTRIAXone (ROCEPHIN) injection 1 g; Inject 1 g into the muscle once. - Patient was held for 15 minutes after ceftriaxone was given, without any signs of negative side effect. - Follow-up in 2 weeks to make sure complete resolution of symptoms.   electronically signed by:  Felix Pacini, DO  [facility] - OR

## 2015-10-31 NOTE — Patient Instructions (Signed)
Urinary Tract Infection Urinary tract infections (UTIs) can develop anywhere along your urinary tract. Your urinary tract is your body's drainage system for removing wastes and extra water. Your urinary tract includes two kidneys, two ureters, a bladder, and a urethra. Your kidneys are a pair of bean-shaped organs. Each kidney is about the size of your fist. They are located below your ribs, one on each side of your spine. CAUSES Infections are caused by microbes, which are microscopic organisms, including fungi, viruses, and bacteria. These organisms are so small that they can only be seen through a microscope. Bacteria are the microbes that most commonly cause UTIs. SYMPTOMS  Symptoms of UTIs may vary by age and gender of the patient and by the location of the infection. Symptoms in young women typically include a frequent and intense urge to urinate and a painful, burning feeling in the bladder or urethra during urination. Older women and men are more likely to be tired, shaky, and weak and have muscle aches and abdominal pain. A fever may mean the infection is in your kidneys. Other symptoms of a kidney infection include pain in your back or sides below the ribs, nausea, and vomiting. DIAGNOSIS To diagnose a UTI, your caregiver will ask you about your symptoms. Your caregiver will also ask you to provide a urine sample. The urine sample will be tested for bacteria and white blood cells. White blood cells are made by your body to help fight infection. TREATMENT  Typically, UTIs can be treated with medication. Because most UTIs are caused by a bacterial infection, they usually can be treated with the use of antibiotics. The choice of antibiotic and length of treatment depend on your symptoms and the type of bacteria causing your infection. HOME CARE INSTRUCTIONS  If you were prescribed antibiotics, take them exactly as your caregiver instructs you. Finish the medication even if you feel better after  you have only taken some of the medication.  Drink enough water and fluids to keep your urine clear or pale yellow.  Avoid caffeine, tea, and carbonated beverages. They tend to irritate your bladder.  Empty your bladder often. Avoid holding urine for long periods of time.  Empty your bladder before and after sexual intercourse.  After a bowel movement, women should cleanse from front to back. Use each tissue only once. SEEK MEDICAL CARE IF:   You have back pain.  You develop a fever.  Your symptoms do not begin to resolve within 3 days. SEEK IMMEDIATE MEDICAL CARE IF:   You have severe back pain or lower abdominal pain.  You develop chills.  You have nausea or vomiting.  You have continued burning or discomfort with urination. MAKE SURE YOU:   Understand these instructions.  Will watch your condition.  Will get help right away if you are not doing well or get worse.   This information is not intended to replace advice given to you by your health care provider. Make sure you discuss any questions you have with your health care provider.   Document Released: 04/07/2005 Document Revised: 03/19/2015 Document Reviewed: 08/06/2011 Elsevier Interactive Patient Education 2016 [employer].   Rocephin IM today, start keflex tomorrow (every 6 hours.) If nausea, vomit, increased pain please be seen in ED (or if unable to tolerate keflex).

## 2015-11-02 LAB — URINE CULTURE

## 2015-11-03 ENCOUNTER — Telehealth: Payer: Self-pay | Admitting: Family Medicine

## 2015-11-03 NOTE — Telephone Encounter (Signed)
Spoke with patient reviewed lab results and information. 

## 2015-11-03 NOTE — Telephone Encounter (Signed)
Please call pt: - her urine studies shows E.Coli UTI and the medication prescribed will cover the infection.

## 2015-12-17 ENCOUNTER — Ambulatory Visit (INDEPENDENT_AMBULATORY_CARE_PROVIDER_SITE_OTHER): Payer: PRIVATE HEALTH INSURANCE | Admitting: Internal Medicine

## 2015-12-17 ENCOUNTER — Encounter: Payer: Self-pay | Admitting: Internal Medicine

## 2015-12-17 ENCOUNTER — Other Ambulatory Visit (INDEPENDENT_AMBULATORY_CARE_PROVIDER_SITE_OTHER): Payer: PRIVATE HEALTH INSURANCE | Admitting: *Deleted

## 2015-12-17 VITALS — BP 122/70 | HR 75 | Temp 98.2°F | Resp 12 | Wt 203.0 lb

## 2015-12-17 DIAGNOSIS — E1165 Type 2 diabetes mellitus with hyperglycemia: Secondary | ICD-10-CM

## 2015-12-17 DIAGNOSIS — Z794 Long term (current) use of insulin: Secondary | ICD-10-CM

## 2015-12-17 DIAGNOSIS — IMO0002 Reserved for concepts with insufficient information to code with codable children: Secondary | ICD-10-CM

## 2015-12-17 LAB — POCT GLYCOSYLATED HEMOGLOBIN (HGB A1C): Hemoglobin A1C: 6.9

## 2015-12-17 MED ORDER — INSULIN DETEMIR 100 UNIT/ML FLEXPEN: 14.0000 [IU] | PEN_INJECTOR | Freq: Every day | SUBCUTANEOUS | Status: DC

## 2015-12-17 MED ORDER — METFORMIN HCL 500 MG PO TABS: 1000.0000 mg | ORAL_TABLET | Freq: Two times a day (BID) | ORAL | Status: DC

## 2015-12-17 NOTE — Progress Notes (Signed)
Patient ID: Raven [name], female   DOB: 23-Jan-1956, 60 y.o.   MRN: [id number]  HPI: Raven [name] is a 60 y.o.-year-old female, returning for f/u for DM2, dx ~1995, insulin-dependent since 11/2013, controlled, without complications. Last visit 4 mo ago.  Her father died in 10/20/2015 >> since then, more stress and more travelling >> poorer diet, forgets meds. Weight is also up 7 lbs.  Last hemoglobin A1c was: Lab Results  Component Value Date   HGBA1C 6.9 12/17/2015   HGBA1C 6.2 08/19/2015   HGBA1C 6.5 04/18/2015   She has RA >> on Simponi (changed from Remicade) and MTX. She was on Prednisone 10 mg since 05/12/2014 >> 7.5 mg in last month >> 5 mg. Was on 15 mg for a long time.  Has repeated steroid tapers and injections.  Pt is on a regimen of - forgets taking the meds for entire days now! - Levemir 14 units qhs - Metformin 1000 mg po bid - Januvia 100 mg daily in am >> started 04/2014  She tried Glimepiride in the past >> fluctuating blood sugar.  Pt checks her sugars 1x a day and they are: - am: 84-140, 212 (ate late) >> 83-120 (mostly 80s) >> 137-155, 203 >> 111-126, 136 >> 105-123 >> 94-110 >> 130-140 - 2h after b'fast: n/c >> 137 >> 140, 155 >> 156-190 >> n/c - before lunch: n/c >> 84-140 >> 84, 89 >> 120 >> n/c - 2h after lunch: 75 x1 >> 145 >> 129-159 >> 124-150 >> 129-160, 186 >> 132-158 >> 128 >> 120-125, 250 if forgets insulin - before dinner: n/c >> 82-143 >> 83-86 >> n/c - 2h after dinner: n/c >> 121 >> 120-160 >> 137, 140 >> 130-154, 190 >> 114-177, 185 >> 145-170, 182 >> 140s, 200s - bedtime: n/c >> 122-150, if dessert >> 213-214 >> 95-105 >> 119, 130 >> n/c - nighttime: n/c No lows. Lowest sugar was 115 >> 94 >> 130;  she has hypoglycemia awareness at 80.  Highest sugar was 258 >> 214 >> 160 >> 203 (after steroid shot) >> 182 >> 359.  Meter: ReliOn.  Pt's meals are: - Breakfast: scrambled eggs + bacon + tomatoes - Lunch: meat + 2 veggies + no bread unless  burger - Dinner: meat + 2 veggies - Snacks: 2: nuts; cheese;   - no CKD, last BUN/creatinine:  Lab Results  Component Value Date   BUN 16 07/15/2015   CREATININE 0.73 07/15/2015  On Lisinopril. - last set of lipids: Lab Results  Component Value Date   CHOL 179 07/15/2015   HDL 44.30 07/15/2015   LDLCALC 120* 07/15/2015   LDLDIRECT 142.3 03/06/2008   TRIG 77.0 07/15/2015   CHOLHDL 4 07/15/2015  She was on statins but taken off b/c transaminitis in 10/2013. She was on Atorvastatin after this, now stopped b/c mm cramps. Now Crestor 10 mg >> no mm aches.  - last eye exam - this year. No DR. + cataract. - no numbness and tingling in her feet.  I reviewed pt's medications, allergies, PMH, social hx, family hx, and changes were documented in the history of present illness. Otherwise, unchanged from my initial visit note.  ROS: Constitutional: no weight gain, no fatigue, no subjective hyperthermia/hypothermia Eyes: no blurry vision, no xerophthalmia ENT: no sore throat, no nodules palpated in throat, no dysphagia/odynophagia, no hoarseness Cardiovascular: no CP/SOB/palpitations/leg swelling Respiratory: no cough/SOB Gastrointestinal: no N/V/D/C/heartburn Musculoskeletal: no muscle/+ joint aches and swelling (L elbow and foot) Skin: + rash (  psoriasis), + hair loss Neurological: no  Tremors/numbness/tingling/dizziness  PE: BP 122/70 mmHg  Pulse 75  Temp(Src) 98.2 F (36.8 C) (Oral)  Resp 12  Wt 203 lb (92.08 kg)  SpO2 97% Body mass index is 31.56 kg/(m^2).  Wt Readings from Last 3 Encounters:  12/17/15 203 lb (92.08 kg)  10/31/15 196 lb (88.905 kg)  08/19/15 196 lb 6.4 oz (89.086 kg)   Constitutional: overweight, in NAD Eyes: PERRLA, EOMI, no exophthalmos ENT: moist mucous membranes, no thyromegaly, no cervical lymphadenopathy Cardiovascular: RRR, No MRG Respiratory: CTA B Gastrointestinal: abdomen soft, NT, ND, BS+ Musculoskeletal: no deformities, strength intact in all  4 Skin: moist, warm, no rashes Neurological: no tremor with outstretched hands, DTR normal in all 4  ASSESSMENT: 1. DM2, insulin-dependent, controlled, without complications  PLAN:  1. Patient with long-standing, uncontrolled diabetes, now with worsened control after her father died 3 mo ago >> her eating habits also worsened and she gained weight and she also forgets her meds. We will not change the regimen, but enforced to take the meds as advised >> set alarms on her phone. - She continues steroids for her RA, but no tapers of Prednisone or steroid injections since last visit - I suggested to:  Patient Instructions  Please continue: - Levemir 14 units at bedtime. - Metformin 1000 mg 2x a day - Januvia 100 mg daily in am   Please come back for a follow-up appointment in 3 months  - continue checking sugars at different times of the day - check 1x times a day, rotating checks - advised for yearly eye exams >> she is UTD - recheck HbA1c today >> 6.9% (higher!) - Return to clinic in 3 mo with sugar log

## 2015-12-17 NOTE — Patient Instructions (Signed)
Please continue: - Levemir 14 units at bedtime. - Metformin 1000 mg 2x a day - Januvia 100 mg daily in am   Try to set reminders on your phone to take the meds.  Please come back for a follow-up appointment in 3 months

## 2015-12-23 ENCOUNTER — Encounter: Payer: Self-pay | Admitting: Family Medicine

## 2016-01-14 ENCOUNTER — Encounter: Payer: Self-pay | Admitting: Family Medicine

## 2016-01-14 ENCOUNTER — Ambulatory Visit (INDEPENDENT_AMBULATORY_CARE_PROVIDER_SITE_OTHER): Payer: PRIVATE HEALTH INSURANCE | Admitting: Family Medicine

## 2016-01-14 VITALS — BP 118/88 | HR 72 | Temp 98.0°F | Resp 16 | Ht 67.25 in | Wt 200.5 lb

## 2016-01-14 DIAGNOSIS — E785 Hyperlipidemia, unspecified: Secondary | ICD-10-CM | POA: Diagnosis not present

## 2016-01-14 DIAGNOSIS — I10 Essential (primary) hypertension: Secondary | ICD-10-CM | POA: Diagnosis not present

## 2016-01-14 DIAGNOSIS — Z23 Encounter for immunization: Secondary | ICD-10-CM

## 2016-01-14 DIAGNOSIS — F411 Generalized anxiety disorder: Secondary | ICD-10-CM

## 2016-01-14 DIAGNOSIS — Z1239 Encounter for other screening for malignant neoplasm of breast: Secondary | ICD-10-CM

## 2016-01-14 DIAGNOSIS — Z0000 Encounter for general adult medical examination without abnormal findings: Secondary | ICD-10-CM

## 2016-01-14 NOTE — Addendum Note (Signed)
Addended by: Smitty Knudsen on: 01/14/2016 09:46 AM   Modules accepted: Orders

## 2016-01-14 NOTE — Progress Notes (Signed)
Pre visit review using our clinic review tool, if applicable. No additional management support is needed unless otherwise documented below in the visit note. 

## 2016-01-14 NOTE — Progress Notes (Addendum)
OFFICE VISIT  01/14/2016   CC:  Chief Complaint  Patient presents with  . Follow-up    Pt is fasting.    HPI:    Patient is a 60 y.o. Caucasian female who presents for 6 mo f/u HLD, HTN, high risk medication use, and anxiety. HTN: home monitoring shows 110-120 over 70s.  Compliant with meds. Also compliant with cholesterol med.  No side effects. Feels like anxiety level is stable on the zoloft.    Dr. Trudie Reed discovered elevated LFTs on recent lab check.  She is set to get retesting on 01/21/16.  NO med adjustment has been made as of yet.  Has not had a mammogram in about 15 yrs.  Has hx of abnl mammo--her first mammogram---and bx ended up showing benign fibrocystic change.  No FH of breast ca.   Past Medical History  Diagnosis Date  . Hyperlipidemia   . Migraine   . Hypertension   . Acute medial meniscus tear of right knee   . GERD (gastroesophageal reflux disease) per pt watches diet and take tums as needed  . Diabetes mellitus oral meds  . Anxiety   . Seasonal allergies   . Interstitial cystitis   . H/O hiatal hernia   . Lichen sclerosus of female genitalia   . Seropositive rheumatoid arthritis (HCC)     Hands, knees, jaw, elbow: responding to Simponi as of 09/29/15 rheum f/u.  Chronic low-dose prednisone therapy.  . Psoriasis   . Osteoarthritis     Past Surgical History  Procedure Laterality Date  . Tubal ligation  1981  . Breast biopsy  2001    fibrocystic breast disease  . Abdominal hysterectomy  1991    for fibroids; ovaries still in  . Cystoscopy  2004    for chronic UTI  . Knee arthroscopy  10/21/2011    Procedure: ARTHROSCOPY KNEE;  Surgeon: Tobi Bastos, MD;  [facility];  Service: Orthopedics;  Laterality: Right;  WITH MEDIAL and lateral shaving of femoral chondyl  with microfracture technique of lateral and medial femoral chondyl suprapatellar synovectomy  . Intraarticular steroid injection  2013    3 R knee & L X 1; Dr  Rushie Nyhan  . Knee arthrocentesis  2013    R knee x 3 & L X 1  . Total knee arthroplasty  04/12/2012    Procedure: TOTAL KNEE ARTHROPLASTY;  Surgeon: Tobi Bastos, MD;  Location: [facility];  Service: Orthopedics;  Laterality: Right;  Right Total Knee Arthroplasty  . Esophagogastroduodenoscopy  2012    Nl except antral gastritis: bx = mild chron gastritis (H pyloria neg)   . Colonoscopy  2015    Recall 5 yrs--check on this    Outpatient Prescriptions Prior to Visit  Medication Sig Dispense Refill  . cetirizine (ZYRTEC) 10 MG tablet Take 10 mg by mouth every evening.     . diazepam (VALIUM) 5 MG tablet Take 1 tablet (5 mg total) by mouth every 12 (twelve) hours as needed. Migraine headaches 30 tablet 0  . FOLIC ACID PO Take 1 mg by mouth daily.     Marland Kitchen glucose blood (ONE TOUCH ULTRA TEST) test strip Check blood sugar daily dx: 250.02 100 each 3  . Golimumab (SIMPONI [place]) Inject 50 mg into the skin.    . hydroxychloroquine (PLAQUENIL) 200 MG tablet Take 200 mg by mouth 2 (two) times daily. Added by rheum 12/23/15    . Insulin Detemir (LEVEMIR) 100 UNIT/ML Pen Inject 14  Units into the skin daily at 10 pm. 15 mL 2  . lisinopril (PRINIVIL,ZESTRIL) 20 MG tablet Take 0.5 tablets (10 mg total) by mouth daily. 90 tablet 3  . metFORMIN (GLUCOPHAGE) 500 MG tablet Take 2 tablets (1,000 mg total) by mouth 2 (two) times daily with a meal. 360 tablet 3  . Methotrexate Sodium (METHOTREXATE PO) Inject 0.8 mg as directed every Wednesday.     . Omega-3 Fatty Acids (FISH OIL) 1000 MG CAPS Take by mouth 2 (two) times daily.    Glory Rosebush DELICA LANCETS FINE MISC 1 each by Does not apply route daily. Dx:250.02 100 each 1  . predniSONE (DELTASONE) 5 MG tablet Take 10 mg by mouth daily. Take 7.5 mg by mouth daily with breakfast x 1 month then 65m    . primidone (MYSOLINE) 50 MG tablet Take 50 mg by mouth. 3 by mouth at bedtime    . ranitidine (ZANTAC) 75 MG tablet Take 75 mg by mouth 2 (two) times daily.    .  rosuvastatin (CRESTOR) 10 MG tablet Take 1 tablet (10 mg total) by mouth daily. 90 tablet 0  . sertraline (ZOLOFT) 50 MG tablet Take 1.5 tablets (75 mg total) by mouth daily with breakfast. 135 tablet 3  . sitaGLIPtin (JANUVIA) 100 MG tablet Take 1 tablet (100 mg total) by mouth daily. 90 tablet 3  . triamcinolone (KENALOG) 0.025 % ointment Apply 1 application topically as needed.     No facility-administered medications prior to visit.    Allergies  Allergen Reactions  . Penicillins Rash  . Pravastatin     Myalgias  . Simvastatin     Myalgias  . Hydrocodone-Acetaminophen Nausea And Vomiting    ROS As per HPI  PE: Blood pressure 118/88, pulse 72, temperature 98 F (36.7 C), temperature source Oral, resp. rate 16, height 5' 7.25" (1.708 m), weight 200 lb 8 oz (90.946 kg), SpO2 96 %. Gen: Alert, well appearing.  Patient is oriented to person, place, time, and situation. CV: RRR, no m/r/g.   LUNGS: CTA bilat, nonlabored resps, good aeration in all lung fields. EXT: no clubbing, cyanosis, or edema.    LABS:  Lab Results  Component Value Date   TSH 1.64 06/24/2014   Lab Results  Component Value Date   WBC 9.8 07/15/2015   HGB 11.4* 07/15/2015   HCT 35.9* 07/15/2015   MCV 84.1 07/15/2015   PLT 316.0 07/15/2015   Lab Results  Component Value Date   CREATININE 0.73 07/15/2015   BUN 16 07/15/2015   NA 139 07/15/2015   K 4.4 07/15/2015   CL 102 07/15/2015   CO2 27 07/15/2015   Lab Results  Component Value Date   ALT 19 07/15/2015   AST 14 07/15/2015   ALKPHOS 42 07/15/2015   BILITOT 0.3 07/15/2015   Lab Results  Component Value Date   CHOL 179 07/15/2015   Lab Results  Component Value Date   HDL 44.30 07/15/2015   Lab Results  Component Value Date   LDLCALC 120* 07/15/2015   Lab Results  Component Value Date   TRIG 77.0 07/15/2015   Lab Results  Component Value Date   CHOLHDL 4 07/15/2015   Lab Results  Component Value Date   HGBA1C 6.9  12/17/2015    IMPRESSION AND PLAN:  1) Hyperlipidemia: The current medical regimen is effective;  continue present plan and medications. FLP today.  2) HTN: The current medical regimen is effective;  continue present plan and medications. Will  get results of recent CMET done by Dr. Trudie Reed 12/23/15.  3) GAD: The current medical regimen is effective;  continue present plan and medications.  4) Elevated liver tests--per pt report: will obtain lab records from Dr. Trudie Reed.  Repeat labs already planned by Dr. Trudie Reed.  5) Preventative health care: pneumovax 23 given today. Screening mammogram ordered today.  An After Visit Summary was printed and given to the patient.  FOLLOW UP: Return in about 6 months (around 07/16/2016) for annual CPE (fasting).  Signed:  Crissie Sickles, MD           01/14/2016   ADDENDUM 01/18/16: Reviewed Dr. Trudie Reed' labs from 12/23/15: the only abnormalities were an ALT of 46 and Alk phos of 49.  CBC w/diff normal, remainder of CMET normal, CRP and ESR normal, quantiferon gold testing negative.

## 2016-01-19 ENCOUNTER — Encounter: Payer: Self-pay | Admitting: Family Medicine

## 2016-01-27 ENCOUNTER — Ambulatory Visit (HOSPITAL_BASED_OUTPATIENT_CLINIC_OR_DEPARTMENT_OTHER)
Admission: RE | Admit: 2016-01-27 | Discharge: 2016-01-27 | Disposition: A | Discharge status: Home / Self Care | Payer: PRIVATE HEALTH INSURANCE | Source: Ambulatory Visit | Attending: Family Medicine | Admitting: Family Medicine

## 2016-01-27 DIAGNOSIS — Z1239 Encounter for other screening for malignant neoplasm of breast: Secondary | ICD-10-CM

## 2016-01-27 DIAGNOSIS — Z1231 Encounter for screening mammogram for malignant neoplasm of breast: Secondary | ICD-10-CM | POA: Insufficient documentation

## 2016-01-27 DIAGNOSIS — R921 Mammographic calcification found on diagnostic imaging of breast: Secondary | ICD-10-CM | POA: Diagnosis not present

## 2016-02-05 ENCOUNTER — Other Ambulatory Visit: Payer: Self-pay | Admitting: Family Medicine

## 2016-02-05 DIAGNOSIS — R928 Other abnormal and inconclusive findings on diagnostic imaging of breast: Secondary | ICD-10-CM

## 2016-02-06 ENCOUNTER — Other Ambulatory Visit: Payer: Self-pay | Admitting: Oncology

## 2016-02-09 ENCOUNTER — Other Ambulatory Visit: Payer: Self-pay | Admitting: Family Medicine

## 2016-02-09 ENCOUNTER — Ambulatory Visit
Admission: RE | Admit: 2016-02-09 | Discharge: 2016-02-09 | Disposition: A | Discharge status: Home / Self Care | Payer: PRIVATE HEALTH INSURANCE | Source: Ambulatory Visit | Attending: Family Medicine | Admitting: Family Medicine

## 2016-02-09 ENCOUNTER — Encounter: Payer: Self-pay | Admitting: Family Medicine

## 2016-02-09 DIAGNOSIS — R928 Other abnormal and inconclusive findings on diagnostic imaging of breast: Secondary | ICD-10-CM

## 2016-02-12 ENCOUNTER — Ambulatory Visit
Admission: RE | Admit: 2016-02-12 | Discharge: 2016-02-12 | Disposition: A | Discharge status: Home / Self Care | Payer: PRIVATE HEALTH INSURANCE | Source: Ambulatory Visit | Attending: Family Medicine | Admitting: Family Medicine

## 2016-02-12 ENCOUNTER — Encounter: Payer: Self-pay | Admitting: Family Medicine

## 2016-02-12 ENCOUNTER — Other Ambulatory Visit: Payer: Self-pay | Admitting: Family Medicine

## 2016-02-12 DIAGNOSIS — R928 Other abnormal and inconclusive findings on diagnostic imaging of breast: Secondary | ICD-10-CM

## 2016-02-22 ENCOUNTER — Encounter: Payer: Self-pay | Admitting: Family Medicine

## 2016-03-06 ENCOUNTER — Other Ambulatory Visit: Payer: Self-pay | Admitting: Family Medicine

## 2016-03-06 DIAGNOSIS — E785 Hyperlipidemia, unspecified: Secondary | ICD-10-CM

## 2016-03-08 ENCOUNTER — Other Ambulatory Visit: Payer: Self-pay | Admitting: Family Medicine

## 2016-03-08 DIAGNOSIS — E785 Hyperlipidemia, unspecified: Secondary | ICD-10-CM

## 2016-03-08 MED ORDER — ROSUVASTATIN CALCIUM 10 MG PO TABS: 10.0000 mg | ORAL_TABLET | Freq: Every day | ORAL | 0 refills | Status: DC

## 2016-03-18 ENCOUNTER — Ambulatory Visit: Payer: PRIVATE HEALTH INSURANCE | Admitting: Internal Medicine

## 2016-03-25 ENCOUNTER — Encounter: Payer: Self-pay | Admitting: Family Medicine

## 2016-04-05 ENCOUNTER — Encounter: Payer: Self-pay | Admitting: Family Medicine

## 2016-04-05 ENCOUNTER — Ambulatory Visit (INDEPENDENT_AMBULATORY_CARE_PROVIDER_SITE_OTHER): Payer: PRIVATE HEALTH INSURANCE | Admitting: Family Medicine

## 2016-04-05 VITALS — BP 106/71 | HR 72 | Temp 98.6°F | Resp 20 | Wt 202.0 lb

## 2016-04-05 DIAGNOSIS — J0100 Acute maxillary sinusitis, unspecified: Secondary | ICD-10-CM

## 2016-04-05 MED ORDER — CEFDINIR 300 MG PO CAPS: 300.0000 mg | ORAL_CAPSULE | Freq: Two times a day (BID) | ORAL | 0 refills | Status: DC

## 2016-04-05 NOTE — Patient Instructions (Addendum)
Sinusitis, Adult Sinusitis is redness, soreness, and inflammation of the paranasal sinuses. Paranasal sinuses are air pockets within the bones of your face. They are located beneath your eyes, in the middle of your forehead, and above your eyes. In healthy paranasal sinuses, mucus is able to drain out, and air is able to circulate through them by way of your nose. However, when your paranasal sinuses are inflamed, mucus and air can become trapped. This can allow bacteria and other germs to grow and cause infection. Sinusitis can develop quickly and last only a short time (acute) or continue over a long period (chronic). Sinusitis that lasts for more than 12 weeks is considered chronic. CAUSES Causes of sinusitis include:  Allergies.  Structural abnormalities, such as displacement of the cartilage that separates your nostrils (deviated septum), which can decrease the air flow through your nose and sinuses and affect sinus drainage.  Functional abnormalities, such as when the small hairs (cilia) that line your sinuses and help remove mucus do not work properly or are not present. SIGNS AND SYMPTOMS Symptoms of acute and chronic sinusitis are the same. The primary symptoms are pain and pressure around the affected sinuses. Other symptoms include:  Upper toothache.  Earache.  Headache.  Bad breath.  Decreased sense of smell and taste.  A cough, which worsens when you are lying flat.  Fatigue.  Fever.  Thick drainage from your nose, which often is green and may contain pus (purulent).  Swelling and warmth over the affected sinuses. DIAGNOSIS Your health care provider will perform a physical exam. During your exam, your health care provider may perform any of the following to help determine if you have acute sinusitis or chronic sinusitis:  Look in your nose for signs of abnormal growths in your nostrils (nasal polyps).  Tap over the affected sinus to check for signs of  infection.  View the inside of your sinuses using an imaging device that has a light attached (endoscope). If your health care provider suspects that you have chronic sinusitis, one or more of the following tests may be recommended:  Allergy tests.  Nasal culture. A sample of mucus is taken from your nose, sent to a lab, and screened for bacteria.  Nasal cytology. A sample of mucus is taken from your nose and examined by your health care provider to determine if your sinusitis is related to an allergy. TREATMENT Most cases of acute sinusitis are related to a viral infection and will resolve on their own within 10 days. Sometimes, medicines are prescribed to help relieve symptoms of both acute and chronic sinusitis. These may include pain medicines, decongestants, nasal steroid sprays, or saline sprays. However, for sinusitis related to a bacterial infection, your health care provider will prescribe antibiotic medicines. These are medicines that will help kill the bacteria causing the infection. Rarely, sinusitis is caused by a fungal infection. In these cases, your health care provider will prescribe antifungal medicine. For some cases of chronic sinusitis, surgery is needed. Generally, these are cases in which sinusitis recurs more than 3 times per year, despite other treatments. HOME CARE INSTRUCTIONS  Drink plenty of water. Water helps thin the mucus so your sinuses can drain more easily.  Use a humidifier.  Inhale steam 3-4 times a day (for example, sit in the bathroom with the shower running).  Apply a warm, moist washcloth to your face 3-4 times a day, or as directed by your health care provider.  Use saline nasal sprays to help   moisten and clean your sinuses.  Take medicines only as directed by your health care provider.  If you were prescribed either an antibiotic or antifungal medicine, finish it all even if you start to feel better. SEEK IMMEDIATE MEDICAL CARE IF:  You have  increasing pain or severe headaches.  You have nausea, vomiting, or drowsiness.  You have swelling around your face.  You have vision problems.  You have a stiff neck.  You have difficulty breathing.   This information is not intended to replace advice given to you by your health care provider. Make sure you discuss any questions you have with your health care provider.   Document Released: 06/28/2005 Document Revised: 07/19/2014 Document Reviewed: 07/13/2011 Elsevier Interactive Patient Education 2016 [employer]   - Rest, hydrate - continue zyrtec - Start flonase, mucinex and cefdinir - F/U PRN, or if not improved in 2 weeks.

## 2016-04-05 NOTE — Progress Notes (Signed)
Raven [name] , 10/07/1955, 59 y.o., female MRN: [id number] Patient Care Team    Relationship Specialty Notifications Start End  Jeoffrey Massed, MD PCP - General Family Medicine  07/15/15   Zenovia Jordan, MD Consulting Physician Rheumatology  07/15/15   Graylin Shiver, MD Consulting Physician Gastroenterology  07/15/15   Carlus Pavlov, MD Consulting Physician Endocrinology  07/15/15   Haroldine Laws, MD Referring Physician Neurology  01/14/16     CC: nasal congestion Subjective: Pt presents for an acute OV with complaints of nasal congestion of 5 days duration.  Associated symptoms include eye drainage, nasal congestion, ear pressure, green nasal drainage, blood nose, teeth hurt, sore throat, dry cough. She admits to fever at night.  She was exposed to a child with strep throat.  Pt has tried nettie pot and daily zyrtec to ease their symptoms.  She is on plaquenil, prednisone 5 mg and methotrexate.  Allergies  Allergen Reactions  . Penicillins Rash  . Pravastatin     Myalgias  . Simvastatin     Myalgias  . Hydrocodone-Acetaminophen Nausea And Vomiting   Social History  Substance Use Topics  . Smoking status: Never Smoker  . Smokeless tobacco: Never Used  . Alcohol use No   Past Medical History:  Diagnosis Date  . Abnormal mammogram 02/09/2016   Left breast distortion-?mass?-bilat breast calcifications: bx to be done as of 02/09/16  . Acute medial meniscus tear of right knee   . Anxiety   . Diabetes mellitus oral meds  . GERD (gastroesophageal reflux disease) per pt watches diet and take tums as needed  . H/O hiatal hernia   . Hyperlipidemia   . Hypertension   . Interstitial cystitis   . Lichen sclerosus of female genitalia   . Migraine   . Osteoarthritis   . Psoriasis   . Seasonal allergies   . Seropositive rheumatoid arthritis (HCC)    Hands, knees, jaw, elbow: responding to Simponi as of 09/29/15 rheum f/u.  Waning effectiveness on 03/2016 f/u so plan is to switch to  Orencia injections.  Chronic low-dose prednisone therapy.   Past Surgical History:  Procedure Laterality Date  . ABDOMINAL HYSTERECTOMY  1991   for fibroids; ovaries still in  . BREAST BIOPSY  2001; 02/12/16   fibrocystic breast disease in 2001 and again in 02/2016 (+scar from prev bx site w/calcifications).  . COLONOSCOPY  09/12/2013   Recall 5 yrs ([facility], Dr. Evette Cristal) due to FH of colon polyps.  . CYSTOSCOPY  2004   for chronic UTI  . ESOPHAGOGASTRODUODENOSCOPY  04/19/2011   Nl except antral gastritis: bx = mild chron gastritis (H pyloria neg)   . intraarticular steroid injection  2013   3 R knee & L X 1; Dr Netta Corrigan  . KNEE ARTHROCENTESIS  2013   R knee x 3 & L X 1  . KNEE ARTHROSCOPY  10/21/2011   Procedure: ARTHROSCOPY KNEE;  Surgeon: Jacki Cones, MD;  Location: [facility];  Service: Orthopedics;  Laterality: Right;  WITH MEDIAL and lateral shaving of femoral chondyl  with microfracture technique of lateral and medial femoral chondyl suprapatellar synovectomy  . TOTAL KNEE ARTHROPLASTY  04/12/2012   Procedure: TOTAL KNEE ARTHROPLASTY;  Surgeon: Jacki Cones, MD;  Location: [facility];  Service: Orthopedics;  Laterality: Right;  Right Total Knee Arthroplasty  . TUBAL LIGATION  1981   Family History  Problem Relation Age of Onset  . Diabetes Mother   . Stroke  Mother 86  . Osteoarthritis Mother   . Stroke Brother 22  . Heart disease Father     CABG  . Lung cancer Father   . Prostate cancer Father   . Pancreatic cancer Father   . Pancreatic cancer Paternal Grandfather   . Bipolar disorder Daughter   . Anxiety disorder Son   . Arthritis Maternal Grandmother     rheumatoid  . Heart attack Brother 62    smoker     Medication List       Accurate as of 04/05/16 11:40 AM. Always use your most recent med list.          cetirizine 10 MG tablet Commonly known as:  ZYRTEC Take 10 mg by mouth every evening.   diazepam 5 MG tablet Commonly known as:   VALIUM Take 1 tablet (5 mg total) by mouth every 12 (twelve) hours as needed. Migraine headaches   Fish Oil 1000 MG Caps Take by mouth 2 (two) times daily.   FOLIC ACID PO Take 1 mg by mouth daily.   glucose blood test strip Commonly known as:  ONE TOUCH ULTRA TEST Check blood sugar daily dx: 250.02   hydroxychloroquine 200 MG tablet Commonly known as:  PLAQUENIL Take 200 mg by mouth 2 (two) times daily. Added by rheum 12/23/15   Insulin Detemir 100 UNIT/ML Pen Commonly known as:  LEVEMIR Inject 14 Units into the skin daily at 10 pm.   lisinopril 20 MG tablet Commonly known as:  PRINIVIL,ZESTRIL Take 0.5 tablets (10 mg total) by mouth daily.   metFORMIN 500 MG tablet Commonly known as:  GLUCOPHAGE Take 2 tablets (1,000 mg total) by mouth 2 (two) times daily with a meal.   METHOTREXATE PO Inject 0.8 mg as directed every Wednesday.   ONETOUCH DELICA LANCETS FINE Misc 1 each by Does not apply route daily. Dx:250.02   predniSONE 5 MG tablet Commonly known as:  DELTASONE Take 10 mg by mouth daily. Take 7.5 mg by mouth daily with breakfast x 1 month then 5mg    primidone 50 MG tablet Commonly known as:  MYSOLINE Take 50 mg by mouth. 3 by mouth at bedtime   ranitidine 75 MG tablet Commonly known as:  ZANTAC Take 75 mg by mouth 2 (two) times daily.   rosuvastatin 10 MG tablet Commonly known as:  CRESTOR Take 1 tablet (10 mg total) by mouth daily.   sertraline 50 MG tablet Commonly known as:  ZOLOFT Take 1.5 tablets (75 mg total) by mouth daily with breakfast.   SIMPONI [place] Inject 50 mg into the skin.   sitaGLIPtin 100 MG tablet Commonly known as:  JANUVIA Take 1 tablet (100 mg total) by mouth daily.   triamcinolone 0.025 % ointment Commonly known as:  KENALOG Apply 1 application topically as needed.       No results found for this or any previous visit (from the past 24 hour(s)). No results found.   ROS: Negative, with the exception of above mentioned in  HPI   Objective:  BP 106/71 (BP Location: Left Arm, Patient Position: Sitting, Cuff Size: Large)   Pulse 72   Temp 98.6 F (37 C)   Resp 20   Wt 202 lb (91.6 kg)   SpO2 96%   BMI 31.40 kg/m  Body mass index is 31.4 kg/m. Gen: Afebrile. No acute distress. Nontoxic in appearance, well developed, well nourished. Congestion present.  HENT: AT. . Bilateral TM visualized WNL. MMM, no oral lesions. Bilateral nares erythema,  drainage and mild swelling. Throat without erythema or exudates. No cough on exam, TTP frontal and max sinus.  Eyes:Pupils Equal Round Reactive to light, Extraocular movements intact,  Conjunctiva without redness, discharge or icterus. Neck/lymp/endocrine: Supple, no lymphadenopathy CV: RRR  Chest: CTAB, no wheeze or crackles. Good air movement, normal resp effort.  Abd: Soft. NTND. BS present Skin: no rashes, purpura or petechiae.  Neuro: Normal gait. PERLA. EOMi. Alert. Oriented x3   Assessment/Plan: Raven [name] is a 60 y.o. female present for acute OV for  Acute maxillary sinusitis, recurrence not specified  - pt on chronic steroids, plaquenil and methotrexate for RA, also a diabetic.  - Rest, hydrate - continue zyrtec - Start flonase, mucinex and cefdinir - F/U PRN, or if not improved in 2 weeks.   > 25 minutes spent with patient, >50% of time spent face to face counseling patient and coordinating care.   electronically signed by:  Felix Pacini, DO  [facility] - [place]

## 2016-04-20 ENCOUNTER — Encounter: Payer: Self-pay | Admitting: Internal Medicine

## 2016-04-20 ENCOUNTER — Ambulatory Visit (INDEPENDENT_AMBULATORY_CARE_PROVIDER_SITE_OTHER): Payer: PRIVATE HEALTH INSURANCE | Admitting: Internal Medicine

## 2016-04-20 VITALS — BP 122/82 | HR 69 | Ht 68.0 in | Wt 200.0 lb

## 2016-04-20 DIAGNOSIS — E119 Type 2 diabetes mellitus without complications: Secondary | ICD-10-CM | POA: Diagnosis not present

## 2016-04-20 DIAGNOSIS — Z794 Long term (current) use of insulin: Secondary | ICD-10-CM

## 2016-04-20 LAB — POCT GLYCOSYLATED HEMOGLOBIN (HGB A1C): Hemoglobin A1C: 5.9

## 2016-04-20 MED ORDER — INSULIN DETEMIR 100 UNIT/ML FLEXPEN: 14.0000 [IU] | PEN_INJECTOR | Freq: Every day | SUBCUTANEOUS | 2 refills | Status: DC

## 2016-04-20 MED ORDER — SITAGLIPTIN PHOSPHATE 100 MG PO TABS: 100.0000 mg | ORAL_TABLET | Freq: Every day | ORAL | 3 refills | Status: DC

## 2016-04-20 NOTE — Patient Instructions (Signed)
Please continue: - Levemir 14 units at bedtime. - Metformin 1000 mg 2x a day - Januvia 100 mg daily in am   Please come back for a follow-up appointment in 4 months.  KEEP UP THE GREAT WORK!!!

## 2016-04-20 NOTE — Progress Notes (Signed)
Patient ID: Raven [name], female   DOB: 07/21/1955, 60 y.o.   MRN: [id number]  HPI: Raven [name] is a 60 y.o.-year-old female, returning for f/u for DM2, dx ~1995, insulin-dependent since 11/2013, controlled, without complications. Last visit 4 mo ago.  She and her husband changed her diet >> no concentrated sweets and less starches. Sugars are much better!  Last hemoglobin A1c was: Lab Results  Component Value Date   HGBA1C 6.9 12/17/2015   HGBA1C 6.2 08/19/2015   HGBA1C 6.5 04/18/2015   She has RA >> was on Simponi (changed from Remicade) and MTX >> will start Orencia and remains on MTX . She was on Prednisone 10 mg since 05/12/2014 >> 7.5 mg in last month >> 5 mg. Was on 15 mg for a long time.  Has repeated steroid tapers and injections.   Pt is on a regimen of: - Levemir 14 units qhs - Metformin 1000 mg po bid - Januvia 100 mg daily in am >> started 04/2014  She tried Glimepiride in the past >> fluctuating blood sugar.  Pt checks her sugars 1x a day and they are: - am:83-120 (mostly 80s) >> 137-155, 203 >> 111-126, 136 >> 105-123 >> 94-110 >> 130-140 >> 97-120 - 2h after b'fast: n/c >> 137 >> 140, 155 >> 156-190 >> n/c >> 121-160, 180 - before lunch: n/c >> 84-140 >> 84, 89 >> 120 >> n/c >> 103 - 2h after lunch: 129-159 >> 124-150 >> 129-160, 186 >> 132-158 >> 128 >> 120-125, 250 if forgets insulin >> 91-165, 180 - before dinner: n/c >> 82-143 >> 83-86 >> n/c - 2h after dinner: 130-154, 190 >> 114-177, 185 >> 145-170, 182 >> 140s, 200s >> 87-171, 226 when misses meds - bedtime: n/c >> 122-150, if dessert >> 213-214 >> 95-105 >> 119, 130 >> n/c >> 97-159, 210 (missed meds) - nighttime: n/c No lows. Lowest sugar was 115 >> 94 >> 130 >> 87;  she has hypoglycemia awareness at 80.  Highest sugar was 258 >> 214 >> 160 >> 203 (after steroid shot) >> 182 >> 359 >> 226.  Meter: ReliOn.  Pt's meals are: - Breakfast: scrambled eggs + bacon + tomatoes - Lunch: meat + 2 veggies  + no bread unless burger - Dinner: meat + 2 veggies - Snacks: 2: nuts; cheese;   - no CKD, last BUN/creatinine:  Lab Results  Component Value Date   BUN 16 07/15/2015   CREATININE 0.73 07/15/2015  On Lisinopril. - last set of lipids: Lab Results  Component Value Date   CHOL 179 07/15/2015   HDL 44.30 07/15/2015   LDLCALC 120 (H) 07/15/2015   LDLDIRECT 142.3 03/06/2008   TRIG 77.0 07/15/2015   CHOLHDL 4 07/15/2015  She was on statins but taken off b/c transaminitis in 10/2013. She was on Atorvastatin after this, now stopped b/c mm cramps. Now Crestor 10 mg >> no mm aches.  - last eye exam - 2016. No DR. + cataract. - no numbness and tingling in her feet.  I reviewed pt's medications, allergies, PMH, social hx, family hx, and changes were documented in the history of present illness. Otherwise, unchanged from my initial visit note.  ROS: Constitutional: no weight gain, + fatigue, no subjective hyperthermia/hypothermia Eyes: no blurry vision, no xerophthalmia ENT: no sore throat, no nodules palpated in throat, no dysphagia/odynophagia, no hoarseness Cardiovascular: no CP/SOB/palpitations/leg swelling Respiratory: no cough/SOB Gastrointestinal: no N/V/D/C/heartburn Musculoskeletal: no muscle/+ joint aches and swelling (L elbow and foot) Skin: +  rash (psoriasis), + hair loss Neurological: no  Tremors/numbness/tingling/dizziness, + HA  PE: BP 122/82 (BP Location: Left Arm, Patient Position: Sitting)   Pulse 69   Ht 5\' 8"  (1.727 m)   Wt 200 lb (90.7 kg)   SpO2 95%   BMI 30.41 kg/m  Body mass index is 30.41 kg/m.  Wt Readings from Last 3 Encounters:  04/20/16 200 lb (90.7 kg)  04/05/16 202 lb (91.6 kg)  01/14/16 200 lb 8 oz (90.9 kg)   Constitutional: overweight, in NAD Eyes: PERRLA, EOMI, no exophthalmos ENT: moist mucous membranes, no thyromegaly, no cervical lymphadenopathy Cardiovascular: RRR, No MRG Respiratory: CTA B Gastrointestinal: abdomen soft, NT, ND,  BS+ Musculoskeletal: no deformities, strength intact in all 4 Skin: moist, warm, no rashes Neurological: no tremor with outstretched hands, DTR normal in all 4  ASSESSMENT: 1. DM2, insulin-dependent, controlled, without complications  PLAN:  1. Patient with long-standing, uncontrolled diabetes, now with improved control on the new low carb diet! I congratulated her for her results. - She continues steroids for her RA, but no tapers of Prednisone or steroid injections since last visit - I suggested to:  Patient Instructions  Please continue: - Levemir 14 units at bedtime. - Metformin 1000 mg 2x a day - Januvia 100 mg daily in am   Please come back for a follow-up appointment in 3 months  - continue checking sugars at different times of the day - check 1x times a day, rotating checks - advised for yearly eye exams >> she is UTD, but needs one soon - recheck HbA1c today >> 5.9% (much better!) - Return to clinic in 4 mo with sugar log   03/16/16, MD PhD [facility] Endocrinology

## 2016-04-20 NOTE — Addendum Note (Signed)
Addended by: Darene Lamer T on: 04/20/2016 08:42 AM   Modules accepted: Orders

## 2016-04-25 ENCOUNTER — Encounter: Payer: Self-pay | Admitting: Family Medicine

## 2016-06-08 ENCOUNTER — Encounter: Payer: Self-pay | Admitting: Family Medicine

## 2016-06-08 ENCOUNTER — Ambulatory Visit (INDEPENDENT_AMBULATORY_CARE_PROVIDER_SITE_OTHER): Payer: PRIVATE HEALTH INSURANCE | Admitting: Family Medicine

## 2016-06-08 VITALS — BP 120/79 | HR 84 | Temp 98.4°F | Resp 20 | Wt 197.5 lb

## 2016-06-08 DIAGNOSIS — J0101 Acute recurrent maxillary sinusitis: Secondary | ICD-10-CM | POA: Diagnosis not present

## 2016-06-08 MED ORDER — PROMETHAZINE-CODEINE 6.25-10 MG/5ML PO SYRP: 5.0000 mL | ORAL_SOLUTION | Freq: Every day | ORAL | 0 refills | Status: DC

## 2016-06-08 MED ORDER — CEFDINIR 300 MG PO CAPS
300.0000 mg | ORAL_CAPSULE | Freq: Two times a day (BID) | ORAL | 0 refills | Status: DC
Start: 2016-06-08 — End: 2016-07-23

## 2016-06-08 NOTE — Progress Notes (Signed)
Raven [name] , 1955-10-21, 60 y.o., female MRN: [id number] Patient Care Team    Relationship Specialty Notifications Start End  Jeoffrey Massed, MD PCP - General Family Medicine  07/15/15   Zenovia Jordan, MD Consulting Physician Rheumatology  07/15/15   Graylin Shiver, MD Consulting Physician Gastroenterology  07/15/15   Carlus Pavlov, MD Consulting Physician Endocrinology  07/15/15   Haroldine Laws, MD Referring Physician Neurology  01/14/16     CC: cough  Subjective: Pt presents for an acute OV with complaints of cough of 6 days duration.  Associated symptoms include nasal congestion, PND, facial pressure and headache, ear pressure. She is tolerating PO. She denies fever, chills, nausea, vomit or diarrhea.  Pt has tried mucinex, rest and hydrate to ease their symptoms. Pt is immunocompromised with RA medications.She felt her symptoms has been getting better until last night her symptoms started to worsen. She has tried the nettie pot, but it will not work because she is too congested. Her husband is also ill.  She has her flu shot last 07/2015.    Allergies  Allergen Reactions  . Penicillins Rash  . Pravastatin     Myalgias  . Simvastatin     Myalgias  . Hydrocodone-Acetaminophen Nausea And Vomiting   Social History  Substance Use Topics  . Smoking status: Never Smoker  . Smokeless tobacco: Never Used  . Alcohol use No   Past Medical History:  Diagnosis Date  . Abnormal mammogram 02/09/2016   Left breast distortion-?mass?-bilat breast calcifications: bx to be done as of 02/09/16  . Acute medial meniscus tear of right knee   . Anxiety   . Diabetes mellitus 1995   Managed by Dr. Elvera Lennox (Endo)  . GERD (gastroesophageal reflux disease) per pt watches diet and take tums as needed  . H/O hiatal hernia   . Hyperlipidemia   . Hypertension   . Interstitial cystitis   . Lichen sclerosus of female genitalia   . Migraine   . Osteoarthritis   . Psoriasis   . Seasonal allergies     . Seropositive rheumatoid arthritis (HCC)    Hands, knees, jaw, elbow: responding to Simponi as of 09/29/15 rheum f/u.  Waning effectiveness on 03/2016 f/u so plan is to switch to Orencia injections.  Chronic low-dose prednisone therapy.   Past Surgical History:  Procedure Laterality Date  . ABDOMINAL HYSTERECTOMY  1991   for fibroids; ovaries still in  . BREAST BIOPSY  2001; 02/12/16   fibrocystic breast disease in 2001 and again in 02/2016 (+scar from prev bx site w/calcifications).  . COLONOSCOPY  09/12/2013   Recall 5 yrs ([facility], Dr. Evette Cristal) due to FH of colon polyps.  . CYSTOSCOPY  2004   for chronic UTI  . ESOPHAGOGASTRODUODENOSCOPY  04/19/2011   Nl except antral gastritis: bx = mild chron gastritis (H pyloria neg)   . intraarticular steroid injection  2013   3 R knee & L X 1; Dr Netta Corrigan  . KNEE ARTHROCENTESIS  2013   R knee x 3 & L X 1  . KNEE ARTHROSCOPY  10/21/2011   Procedure: ARTHROSCOPY KNEE;  Surgeon: Jacki Cones, MD;  Location: [facility];  Service: Orthopedics;  Laterality: Right;  WITH MEDIAL and lateral shaving of femoral chondyl  with microfracture technique of lateral and medial femoral chondyl suprapatellar synovectomy  . TOTAL KNEE ARTHROPLASTY  04/12/2012   Procedure: TOTAL KNEE ARTHROPLASTY;  Surgeon: Jacki Cones, MD;  Location: [facility];  Service: Orthopedics;  Laterality: Right;  Right Total Knee Arthroplasty  . TUBAL LIGATION  1981   Family History  Problem Relation Age of Onset  . Diabetes Mother   . Stroke Mother 55  . Osteoarthritis Mother   . Stroke Brother 9  . Heart disease Father     CABG  . Lung cancer Father   . Prostate cancer Father   . Pancreatic cancer Father   . Pancreatic cancer Paternal Grandfather   . Bipolar disorder Daughter   . Anxiety disorder Son   . Arthritis Maternal Grandmother     rheumatoid  . Heart attack Brother 81    smoker     Medication List       Accurate as of 06/08/16 10:55 AM. Always  use your most recent med list.          cetirizine 10 MG tablet Commonly known as:  ZYRTEC Take 10 mg by mouth every evening.   diazepam 5 MG tablet Commonly known as:  VALIUM Take 1 tablet (5 mg total) by mouth every 12 (twelve) hours as needed. Migraine headaches   Fish Oil 1000 MG Caps Take by mouth 2 (two) times daily.   FOLIC ACID PO Take 1 mg by mouth daily.   hydroxychloroquine 200 MG tablet Commonly known as:  PLAQUENIL Take 200 mg by mouth 2 (two) times daily. Added by rheum 12/23/15   Insulin Detemir 100 UNIT/ML Pen Commonly known as:  LEVEMIR Inject 14 Units into the skin daily at 10 pm.   lisinopril 20 MG tablet Commonly known as:  PRINIVIL,ZESTRIL Take 0.5 tablets (10 mg total) by mouth daily.   metFORMIN 500 MG tablet Commonly known as:  GLUCOPHAGE Take 2 tablets (1,000 mg total) by mouth 2 (two) times daily with a meal.   METHOTREXATE PO Inject 0.8 mg as directed every Wednesday.   ORENCIA 50 MG/0.4ML Sosy Generic drug:  Abatacept Inject 1 Dose into the skin once a week.   predniSONE 5 MG tablet Commonly known as:  DELTASONE Take 10 mg by mouth daily. Take 7.5 mg by mouth daily with breakfast x 1 month then 5mg    primidone 50 MG tablet Commonly known as:  MYSOLINE Take 50 mg by mouth. 3 by mouth at bedtime   ranitidine 75 MG tablet Commonly known as:  ZANTAC Take 75 mg by mouth 2 (two) times daily.   rosuvastatin 10 MG tablet Commonly known as:  CRESTOR Take 1 tablet (10 mg total) by mouth daily.   sertraline 50 MG tablet Commonly known as:  ZOLOFT Take 1.5 tablets (75 mg total) by mouth daily with breakfast.   sitaGLIPtin 100 MG tablet Commonly known as:  JANUVIA Take 1 tablet (100 mg total) by mouth daily.   triamcinolone 0.025 % ointment Commonly known as:  KENALOG Apply 1 application topically as needed.       No results found for this or any previous visit (from the past 24 hour(s)). No results found.   ROS: Negative,  with the exception of above mentioned in HPI   Objective:  BP 120/79 (BP Location: Left Arm, Patient Position: Sitting, Cuff Size: Large)   Pulse 84   Temp 98.4 F (36.9 C)   Resp 20   Wt 197 lb 8 oz (89.6 kg)   SpO2 99%   BMI 30.03 kg/m  Body mass index is 30.03 kg/m. Gen: Afebrile. No acute distress. Nontoxic in appearance, well developed, well nourished. Pleasant caucasian female. HENT: AT. [place]. Bilateral TM  visualized wnl. MMM, no oral lesions. Bilateral nares with erythema and drainage. Throat without erythema or exudates. PND, cough, TTP right max sinus. Eyes:Pupils Equal Round Reactive to light, Extraocular movements intact,  Conjunctiva without redness, discharge or icterus. Neck/lymp/endocrine: Supple,no lymphadenopathy CV: RRR Chest: CTAB, no wheeze or crackles. Good air movement, normal resp effort.  Abd: Soft. obese. NTND. BS present.  Skin: no rashes, purpura or petechiae.  Neuro:Normal gait. PERLA. EOMi. Alert. Oriented x3   Assessment/Plan: Raven [name] is a 60 y.o. female present for acute OV for  Acute maxillary sinusitis, recurrence not specified  -pt on chronic steroids, plaquenil and methotrexate for RA, also a diabetic.  - Rest, hydrate - continue zyrtec - restart flonase and mucinex  - cefdinir, promethazine/codiene cough syrup at night only.  - F/U PRN, or if not improved in 2 weeks.    > 25 minutes spent with patient, >50% of time spent face to face   electronically signed by:  Felix Pacini, DO  [facility] - OR

## 2016-06-08 NOTE — Patient Instructions (Addendum)
-   Rest, hydrate - continue zyrtec - restart flonase and mucinex  - cefdinir start.     Sinusitis, Adult Sinusitis is soreness and inflammation of your sinuses. Sinuses are hollow spaces in the bones around your face. They are located:  Around your eyes.  In the middle of your forehead.  Behind your nose.  In your cheekbones. Your sinuses and nasal passages are lined with a stringy fluid (mucus). Mucus normally drains out of your sinuses. When your nasal tissues get inflamed or swollen, the mucus can get trapped or blocked so air cannot flow through your sinuses. This lets bacteria, viruses, and funguses grow, and that leads to infection. Follow these instructions at home: Medicines  Take, use, or apply over-the-counter and prescription medicines only as told by your doctor. These may include nasal sprays.  If you were prescribed an antibiotic medicine, take it as told by your doctor. Do not stop taking the antibiotic even if you start to feel better. Hydrate and Humidify  Drink enough water to keep your pee (urine) clear or pale yellow.  Use a cool mist humidifier to keep the humidity level in your home above 50%.  Breathe in steam for 10-15 minutes, 3-4 times a day or as told by your doctor. You can do this in the bathroom while a hot shower is running.  Try not to spend time in cool or dry air. Rest  Rest as much as possible.  Sleep with your head raised (elevated).  Make sure to get enough sleep each night. General instructions  Put a warm, moist washcloth on your face 3-4 times a day or as told by your doctor. This will help with discomfort.  Wash your hands often with soap and water. If there is no soap and water, use hand sanitizer.  Do not smoke. Avoid being around people who are smoking (secondhand smoke).  Keep all follow-up visits as told by your doctor. This is important. Contact a doctor if:  You have a fever.  Your symptoms get worse.  Your symptoms  do not get better within 10 days. Get help right away if:  You have a very bad headache.  You cannot stop throwing up (vomiting).  You have pain or swelling around your face or eyes.  You have trouble seeing.  You feel confused.  Your neck is stiff.  You have trouble breathing. This information is not intended to replace advice given to you by your health care provider. Make sure you discuss any questions you have with your health care provider. Document Released: 12/15/2007 Document Revised: 02/22/2016 Document Reviewed: 04/23/2015 Elsevier Interactive Patient Education  2017 [employer].

## 2016-06-23 ENCOUNTER — Encounter: Payer: Self-pay | Admitting: Family Medicine

## 2016-07-16 ENCOUNTER — Encounter: Payer: PRIVATE HEALTH INSURANCE | Admitting: Family Medicine

## 2016-07-23 ENCOUNTER — Encounter: Payer: Self-pay | Admitting: Family Medicine

## 2016-07-23 ENCOUNTER — Ambulatory Visit (INDEPENDENT_AMBULATORY_CARE_PROVIDER_SITE_OTHER): Payer: PRIVATE HEALTH INSURANCE | Admitting: Family Medicine

## 2016-07-23 VITALS — BP 122/68 | HR 91 | Temp 100.9°F | Resp 16 | Ht 68.0 in | Wt 203.0 lb

## 2016-07-23 DIAGNOSIS — R509 Fever, unspecified: Secondary | ICD-10-CM

## 2016-07-23 DIAGNOSIS — J111 Influenza due to unidentified influenza virus with other respiratory manifestations: Secondary | ICD-10-CM

## 2016-07-23 DIAGNOSIS — Z0000 Encounter for general adult medical examination without abnormal findings: Secondary | ICD-10-CM | POA: Diagnosis not present

## 2016-07-23 DIAGNOSIS — R69 Illness, unspecified: Secondary | ICD-10-CM

## 2016-07-23 LAB — TSH: TSH: 1.03 u[IU]/mL (ref 0.35–4.50)

## 2016-07-23 LAB — COMPREHENSIVE METABOLIC PANEL
ALT: 21 U/L (ref 0–35)
AST: 19 U/L (ref 0–37)
Albumin: 4 g/dL (ref 3.5–5.2)
Alkaline Phosphatase: 47 U/L (ref 39–117)
BILIRUBIN TOTAL: 0.4 mg/dL (ref 0.2–1.2)
BUN: 11 mg/dL (ref 6–23)
CALCIUM: 9 mg/dL (ref 8.4–10.5)
CHLORIDE: 107 meq/L (ref 96–112)
CO2: 24 meq/L (ref 19–32)
Creatinine, Ser: 0.78 mg/dL (ref 0.40–1.20)
GFR: 79.78 mL/min (ref >60.00)
Glucose, Bld: 118 mg/dL — ABNORMAL HIGH (ref 70–99)
POTASSIUM: 4.1 meq/L (ref 3.5–5.1)
Sodium: 140 mEq/L (ref 135–145)
Total Protein: 6.5 g/dL (ref 6.0–8.3)

## 2016-07-23 LAB — CBC WITH DIFFERENTIAL/PLATELET
BASOS ABS: 0 10*3/uL (ref 0.0–0.1)
Basophils Relative: 0.4 % (ref 0.0–3.0)
Eosinophils Absolute: 0.1 10*3/uL (ref 0.0–0.7)
Eosinophils Relative: 1.4 % (ref 0.0–5.0)
HEMATOCRIT: 33 % — AB (ref 36.0–46.0)
Hemoglobin: 11 g/dL — ABNORMAL LOW (ref 12.0–15.0)
LYMPHS PCT: 17.3 % (ref 12.0–46.0)
Lymphs Abs: 0.9 10*3/uL (ref 0.7–4.0)
MCHC: 33.3 g/dL (ref 30.0–36.0)
MCV: 81.5 fl (ref 78.0–100.0)
MONOS PCT: 10.7 % (ref 3.0–12.0)
Monocytes Absolute: 0.6 10*3/uL (ref 0.1–1.0)
NEUTROS ABS: 3.6 10*3/uL (ref 1.4–7.7)
Neutrophils Relative %: 70.2 % (ref 43.0–77.0)
Platelets: 211 10*3/uL (ref 150.0–400.0)
RBC: 4.05 Mil/uL (ref 3.87–5.11)
RDW: 16 % — ABNORMAL HIGH (ref 11.5–15.5)
WBC: 5.2 10*3/uL (ref 4.0–10.5)

## 2016-07-23 LAB — POCT INFLUENZA A/B
INFLUENZA A, POC: NEGATIVE
INFLUENZA B, POC: NEGATIVE

## 2016-07-23 LAB — LIPID PANEL
CHOL/HDL RATIO: 3
Cholesterol: 164 mg/dL (ref 0–200)
HDL: 52 mg/dL (ref >39.00)
LDL CALC: 99 mg/dL (ref 0–99)
NonHDL: 112.1
TRIGLYCERIDES: 65 mg/dL (ref 0.0–149.0)
VLDL: 13 mg/dL (ref 0.0–40.0)

## 2016-07-23 MED ORDER — OSELTAMIVIR PHOSPHATE 75 MG PO CAPS: 75.0000 mg | ORAL_CAPSULE | Freq: Two times a day (BID) | ORAL | 0 refills | Status: DC

## 2016-07-23 NOTE — Progress Notes (Signed)
OFFICE VISIT  07/23/2016   CC:  Chief Complaint  Patient presents with  . Annual Exam  . Fever    cough, body aches, head congestion   HPI:    Patient is a 61 y.o. Caucasian female who presents for annual health maintenance exam but she has an acute illness so we chose to defer her CPE today. Onset about 36h ago: cough, mild hoarseness.  No nasal sx's, no ST.  +Bad HA.  Some body aches. No f/c at home bu temp a little elevated here today.  Some noisy/wheezy breathing and feeling of SOB, +chest tightness. No n/v/d.   Past Medical History:  Diagnosis Date  . Abnormal mammogram 02/09/2016   Left breast distortion-?mass?-bilat breast calcifications: bx to be done as of 02/09/16  . Acute medial meniscus tear of right knee   . Anxiety   . Diabetes mellitus 1995   Managed by Dr. Elvera Lennox (Endo)  . GERD (gastroesophageal reflux disease) per pt watches diet and take tums as needed  . H/O hiatal hernia   . Hyperlipidemia   . Hypertension   . Interstitial cystitis   . Lichen sclerosus of female genitalia   . Migraine   . Osteoarthritis   . Psoriasis   . Seasonal allergies   . Seropositive rheumatoid arthritis (HCC)    Hands, knees, jaw, elbow: responding to Simponi as of 09/29/15 rheum f/u.  Waning effectiveness on 03/2016 f/u so pt switched to Orencia injections.  Chronic low-dose prednisone therapy+ methotrexate.    Past Surgical History:  Procedure Laterality Date  . ABDOMINAL HYSTERECTOMY  1991   for fibroids; ovaries still in  . BREAST BIOPSY  2001; 02/12/16   fibrocystic breast disease in 2001 and again in 02/2016 (+scar from prev bx site w/calcifications).  . COLONOSCOPY  09/12/2013   Recall 5 yrs ([facility], Dr. Evette Cristal) due to FH of colon polyps.  . CYSTOSCOPY  2004   for chronic UTI  . ESOPHAGOGASTRODUODENOSCOPY  04/19/2011   Nl except antral gastritis: bx = mild chron gastritis (H pyloria neg)   . intraarticular steroid injection  2013   3 R knee & L X 1; Dr Netta Corrigan  .  KNEE ARTHROCENTESIS  2013   R knee x 3 & L X 1  . KNEE ARTHROSCOPY  10/21/2011   Procedure: ARTHROSCOPY KNEE;  Surgeon: Jacki Cones, MD;  Location: [facility];  Service: Orthopedics;  Laterality: Right;  WITH MEDIAL and lateral shaving of femoral chondyl  with microfracture technique of lateral and medial femoral chondyl suprapatellar synovectomy  . TOTAL KNEE ARTHROPLASTY  04/12/2012   Procedure: TOTAL KNEE ARTHROPLASTY;  Surgeon: Jacki Cones, MD;  Location: [facility];  Service: Orthopedics;  Laterality: Right;  Right Total Knee Arthroplasty  . TUBAL LIGATION  1981    Outpatient Medications Prior to Visit  Medication Sig Dispense Refill  . Abatacept (ORENCIA) 50 MG/0.4ML SOSY Inject 1 Dose into the skin once a week.    . cetirizine (ZYRTEC) 10 MG tablet Take 10 mg by mouth every evening.     . diazepam (VALIUM) 5 MG tablet Take 1 tablet (5 mg total) by mouth every 12 (twelve) hours as needed. Migraine headaches 30 tablet 0  . FOLIC ACID PO Take 1 mg by mouth daily.     . hydroxychloroquine (PLAQUENIL) 200 MG tablet Take 200 mg by mouth 2 (two) times daily. Added by rheum 12/23/15    . Insulin Detemir (LEVEMIR) 100 UNIT/ML Pen Inject 14 Units  into the skin daily at 10 pm. 15 mL 2  . lisinopril (PRINIVIL,ZESTRIL) 20 MG tablet Take 0.5 tablets (10 mg total) by mouth daily. 90 tablet 3  . metFORMIN (GLUCOPHAGE) 500 MG tablet Take 2 tablets (1,000 mg total) by mouth 2 (two) times daily with a meal. 360 tablet 3  . Methotrexate Sodium (METHOTREXATE PO) Inject 0.8 mg as directed every Wednesday.     . Omega-3 Fatty Acids (FISH OIL) 1000 MG CAPS Take by mouth 2 (two) times daily.    . predniSONE (DELTASONE) 5 MG tablet Take 10 mg by mouth daily. Take 7.5 mg by mouth daily with breakfast x 1 month then 5mg     . primidone (MYSOLINE) 50 MG tablet Take 50 mg by mouth. 3 by mouth at bedtime    . ranitidine (ZANTAC) 75 MG tablet Take 75 mg by mouth 2 (two) times daily.    .  rosuvastatin (CRESTOR) 10 MG tablet Take 1 tablet (10 mg total) by mouth daily. 90 tablet 0  . sertraline (ZOLOFT) 50 MG tablet Take 1.5 tablets (75 mg total) by mouth daily with breakfast. 135 tablet 3  . sitaGLIPtin (JANUVIA) 100 MG tablet Take 1 tablet (100 mg total) by mouth daily. 90 tablet 3  . triamcinolone (KENALOG) 0.025 % ointment Apply 1 application topically as needed.    . cefdinir (OMNICEF) 300 MG capsule Take 1 capsule (300 mg total) by mouth 2 (two) times daily. 14 capsule 0  . promethazine-codeine (PHENERGAN WITH CODEINE) 6.25-10 MG/5ML syrup Take 5 mLs by mouth at bedtime. 60 mL 0   No facility-administered medications prior to visit.     Allergies  Allergen Reactions  . Penicillins Rash  . Pravastatin     Myalgias  . Simvastatin     Myalgias  . Hydrocodone-Acetaminophen Nausea And Vomiting    ROS As per HPI  PE: Blood pressure 122/68, pulse 91, temperature (!) 100.9 F (38.3 C), temperature source Oral, resp. rate 16, height 5\' 8"  (1.727 m), weight 203 lb (92.1 kg), SpO2 98 %. VS: noted--normal. Gen: alert, NAD, NONTOXIC APPEARING. HEENT: eyes without injection, drainage, or swelling.  Ears: EACs clear, TMs with normal light reflex and landmarks.  Nose: Clear rhinorrhea, with some dried, crusty exudate adherent to mildly injected mucosa.  No purulent d/c.  No paranasal sinus TTP.  No facial swelling.  Throat and mouth without focal lesion.  No pharyngial swelling, erythema, or exudate.   Neck: supple, no LAD.   LUNGS: CTA bilat, nonlabored resps.   CV: RRR, no m/r/g. EXT: no c/c/e SKIN: no rash  LABS:  Lab Results  Component Value Date   TSH 1.64 06/24/2014   Lab Results  Component Value Date   WBC 9.8 07/15/2015   HGB 11.4 (L) 07/15/2015   HCT 35.9 (L) 07/15/2015   MCV 84.1 07/15/2015   PLT 316.0 07/15/2015   Lab Results  Component Value Date   CREATININE 0.73 07/15/2015   BUN 16 07/15/2015   NA 139 07/15/2015   K 4.4 07/15/2015   CL 102  07/15/2015   CO2 27 07/15/2015   Lab Results  Component Value Date   ALT 19 07/15/2015   AST 14 07/15/2015   ALKPHOS 42 07/15/2015   BILITOT 0.3 07/15/2015   Lab Results  Component Value Date   CHOL 179 07/15/2015   Lab Results  Component Value Date   HDL 44.30 07/15/2015   Lab Results  Component Value Date   LDLCALC 120 (H) 07/15/2015  Lab Results  Component Value Date   TRIG 77.0 07/15/2015   Lab Results  Component Value Date   CHOLHDL 4 07/15/2015   Rapid flu A/B today was NEG.  IMPRESSION AND PLAN:  Influenza-like illness, rapid flu NEG. High risk/immunosuppressed pt, within 48h of onset of illness. Will treat with tamiflu 75 mg bid x 5d. Symptomatic care discussed. She knows to hold her orencia and methotrexate at this time until she is over this illness.  She is fasting so we'll get her fasting CPE labs today, and she'll return in 3-4 weeks for her CPE.  An After Visit Summary was printed and given to the patient.  FOLLOW UP: Return for 3-4 weeks f/u for CPE.  Signed:  Santiago Bumpers, MD           07/23/2016

## 2016-07-23 NOTE — Progress Notes (Signed)
Pre visit review using our clinic review tool, if applicable. No additional management support is needed unless otherwise documented below in the visit note. 

## 2016-07-26 ENCOUNTER — Encounter: Payer: Self-pay | Admitting: Family Medicine

## 2016-07-26 ENCOUNTER — Other Ambulatory Visit (INDEPENDENT_AMBULATORY_CARE_PROVIDER_SITE_OTHER): Payer: PRIVATE HEALTH INSURANCE

## 2016-07-26 DIAGNOSIS — R7301 Impaired fasting glucose: Secondary | ICD-10-CM

## 2016-07-26 LAB — HEMOGLOBIN A1C: HEMOGLOBIN A1C: 6.5 % (ref 4.6–6.5)

## 2016-08-13 ENCOUNTER — Encounter: Payer: PRIVATE HEALTH INSURANCE | Admitting: Family Medicine

## 2016-08-13 NOTE — Progress Notes (Deleted)
Office Note 08/13/2016  CC: No chief complaint on file.   HPI:  Raven [name] is a 61 y.o. female who is here for annual health maintenance exam.   Past Medical History:  Diagnosis Date  . Abnormal mammogram 02/09/2016   Left breast distortion-?mass?-bilat breast calcifications: bx to be done as of 02/09/16  . Acute medial meniscus tear of right knee   . Anemia of chronic disease   . Anxiety   . Diabetes mellitus 1995   Managed by Dr. Elvera Lennox (Endo)  . GERD (gastroesophageal reflux disease) per pt watches diet and take tums as needed  . H/O hiatal hernia   . Hyperlipidemia   . Hypertension   . Interstitial cystitis   . Lichen sclerosus of female genitalia   . Migraine   . Osteoarthritis   . Psoriasis   . Seasonal allergies   . Seropositive rheumatoid arthritis (HCC)    Hands, knees, jaw, elbow: responding to Simponi as of 09/29/15 rheum f/u.  Waning effectiveness on 03/2016 f/u so pt switched to Orencia injections.  Chronic low-dose prednisone therapy+ methotrexate.    Past Surgical History:  Procedure Laterality Date  . ABDOMINAL HYSTERECTOMY  1991   for fibroids; ovaries still in  . BREAST BIOPSY  2001; 02/12/16   fibrocystic breast disease in 2001 and again in 02/2016 (+scar from prev bx site w/calcifications).  . COLONOSCOPY  09/12/2013   Recall 5 yrs ([facility], Dr. Evette Cristal) due to FH of colon polyps.  . CYSTOSCOPY  2004   for chronic UTI  . ESOPHAGOGASTRODUODENOSCOPY  04/19/2011   Nl except antral gastritis: bx = mild chron gastritis (H pyloria neg)   . intraarticular steroid injection  2013   3 R knee & L X 1; Dr Netta Corrigan  . KNEE ARTHROCENTESIS  2013   R knee x 3 & L X 1  . KNEE ARTHROSCOPY  10/21/2011   Procedure: ARTHROSCOPY KNEE;  Surgeon: Jacki Cones, MD;  Location: [facility];  Service: Orthopedics;  Laterality: Right;  WITH MEDIAL and lateral shaving of femoral chondyl  with microfracture technique of lateral and medial femoral chondyl  suprapatellar synovectomy  . TOTAL KNEE ARTHROPLASTY  04/12/2012   Procedure: TOTAL KNEE ARTHROPLASTY;  Surgeon: Jacki Cones, MD;  Location: [facility];  Service: Orthopedics;  Laterality: Right;  Right Total Knee Arthroplasty  . TUBAL LIGATION  1981    Family History  Problem Relation Age of Onset  . Diabetes Mother   . Stroke Mother 67  . Osteoarthritis Mother   . Stroke Brother 33  . Heart disease Father     CABG  . Lung cancer Father   . Prostate cancer Father   . Pancreatic cancer Father   . Pancreatic cancer Paternal Grandfather   . Bipolar disorder Daughter   . Anxiety disorder Son   . Arthritis Maternal Grandmother     rheumatoid  . Heart attack Brother 22    smoker    Social History   Social History  . Marital status: Married    Spouse name: N/A  . Number of children: N/A  . Years of education: N/A   Occupational History  . Not on file.   Social History Main Topics  . Smoking status: Never Smoker  . Smokeless tobacco: Never Used  . Alcohol use No  . Drug use: No  . Sexual activity: Not on file   Other Topics Concern  . Not on file   Social History Narrative  Married, 3 children (2 in [place], 1 in [place]).  4 grandchildren.   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)   No tob/alc/drugs.   2 cups of coffee/day x 3 months   Regular exercise- no-due to arthritis.   Religion affecting care, "it allows stress management:"    Outpatient Medications Prior to Visit  Medication Sig Dispense Refill  . Abatacept (ORENCIA) 50 MG/0.4ML SOSY Inject 1 Dose into the skin once a week.    . cetirizine (ZYRTEC) 10 MG tablet Take 10 mg by mouth every evening.     . diazepam (VALIUM) 5 MG tablet Take 1 tablet (5 mg total) by mouth every 12 (twelve) hours as needed. Migraine headaches 30 tablet 0  . FOLIC ACID PO Take 1 mg by mouth daily.     . hydroxychloroquine (PLAQUENIL) 200 MG tablet Take 200 mg by mouth 2 (two) times daily. Added by rheum 12/23/15    .  Insulin Detemir (LEVEMIR) 100 UNIT/ML Pen Inject 14 Units into the skin daily at 10 pm. 15 mL 2  . lisinopril (PRINIVIL,ZESTRIL) 20 MG tablet Take 0.5 tablets (10 mg total) by mouth daily. 90 tablet 3  . metFORMIN (GLUCOPHAGE) 500 MG tablet Take 2 tablets (1,000 mg total) by mouth 2 (two) times daily with a meal. 360 tablet 3  . Methotrexate Sodium (METHOTREXATE PO) Inject 0.8 mg as directed every Wednesday.     . Omega-3 Fatty Acids (FISH OIL) 1000 MG CAPS Take by mouth 2 (two) times daily.    Marland Kitchen oseltamivir (TAMIFLU) 75 MG capsule Take 1 capsule (75 mg total) by mouth 2 (two) times daily. 10 capsule 0  . predniSONE (DELTASONE) 5 MG tablet Take 10 mg by mouth daily. Take 7.5 mg by mouth daily with breakfast x 1 month then 5mg     . primidone (MYSOLINE) 50 MG tablet Take 50 mg by mouth. 3 by mouth at bedtime    . ranitidine (ZANTAC) 75 MG tablet Take 75 mg by mouth 2 (two) times daily.    . rosuvastatin (CRESTOR) 10 MG tablet Take 1 tablet (10 mg total) by mouth daily. 90 tablet 0  . sertraline (ZOLOFT) 50 MG tablet Take 1.5 tablets (75 mg total) by mouth daily with breakfast. 135 tablet 3  . sitaGLIPtin (JANUVIA) 100 MG tablet Take 1 tablet (100 mg total) by mouth daily. 90 tablet 3  . triamcinolone (KENALOG) 0.025 % ointment Apply 1 application topically as needed.     No facility-administered medications prior to visit.     Allergies  Allergen Reactions  . Penicillins Rash  . Pravastatin     Myalgias  . Simvastatin     Myalgias  . Hydrocodone-Acetaminophen Nausea And Vomiting    ROS *** PE; There were no vitals taken for this visit. *** Pertinent labs:  Lab Results  Component Value Date   TSH 1.03 07/23/2016   Lab Results  Component Value Date   WBC 5.2 07/23/2016   HGB 11.0 (L) 07/23/2016   HCT 33.0 (L) 07/23/2016   MCV 81.5 07/23/2016   PLT 211.0 07/23/2016   Lab Results  Component Value Date   CREATININE 0.78 07/23/2016   BUN 11 07/23/2016   NA 140 07/23/2016   K  4.1 07/23/2016   CL 107 07/23/2016   CO2 24 07/23/2016   Lab Results  Component Value Date   ALT 21 07/23/2016   AST 19 07/23/2016   ALKPHOS 47 07/23/2016   BILITOT 0.4 07/23/2016   Lab Results  Component Value  Date   CHOL 164 07/23/2016   Lab Results  Component Value Date   HDL 52.00 07/23/2016   Lab Results  Component Value Date   LDLCALC 99 07/23/2016   Lab Results  Component Value Date   TRIG 65.0 07/23/2016   Lab Results  Component Value Date   CHOLHDL 3 07/23/2016   Lab Results  Component Value Date   HGBA1C 6.5 07/26/2016     ASSESSMENT AND PLAN:   No problem-specific Assessment & Plan notes found for this encounter.   FOLLOW UP:  No Follow-up on file.

## 2016-08-16 ENCOUNTER — Ambulatory Visit (INDEPENDENT_AMBULATORY_CARE_PROVIDER_SITE_OTHER): Payer: PRIVATE HEALTH INSURANCE | Admitting: Family Medicine

## 2016-08-16 ENCOUNTER — Encounter: Payer: Self-pay | Admitting: Family Medicine

## 2016-08-16 VITALS — BP 97/67 | HR 68 | Temp 97.6°F | Resp 16 | Ht 68.0 in | Wt 200.0 lb

## 2016-08-16 DIAGNOSIS — E785 Hyperlipidemia, unspecified: Secondary | ICD-10-CM

## 2016-08-16 DIAGNOSIS — Z0000 Encounter for general adult medical examination without abnormal findings: Secondary | ICD-10-CM

## 2016-08-16 DIAGNOSIS — E2839 Other primary ovarian failure: Secondary | ICD-10-CM | POA: Diagnosis not present

## 2016-08-16 DIAGNOSIS — F411 Generalized anxiety disorder: Secondary | ICD-10-CM

## 2016-08-16 DIAGNOSIS — Z7952 Long term (current) use of systemic steroids: Secondary | ICD-10-CM | POA: Diagnosis not present

## 2016-08-16 DIAGNOSIS — I10 Essential (primary) hypertension: Secondary | ICD-10-CM

## 2016-08-16 MED ORDER — LISINOPRIL 20 MG PO TABS: 10.0000 mg | ORAL_TABLET | Freq: Every day | ORAL | 3 refills | Status: DC

## 2016-08-16 MED ORDER — SERTRALINE HCL 50 MG PO TABS: 75.0000 mg | ORAL_TABLET | Freq: Every day | ORAL | 3 refills | Status: DC

## 2016-08-16 MED ORDER — ROSUVASTATIN CALCIUM 10 MG PO TABS: 10.0000 mg | ORAL_TABLET | Freq: Every day | ORAL | 3 refills | Status: DC

## 2016-08-16 NOTE — Patient Instructions (Signed)

## 2016-08-16 NOTE — Progress Notes (Signed)
Pre visit review using our clinic review tool, if applicable. No additional management support is needed unless otherwise documented below in the visit note. 

## 2016-08-16 NOTE — Progress Notes (Signed)
Office Note 08/16/2016  CC:  Chief Complaint  Patient presents with  . Annual Exam    fasting    HPI:  Raven [name] is a 61 y.o. White female who is here for annual health maintenance exam. Eye exam: 2017. Dental preventative: q6 mo. Exercising: none. Diet: lower fats and carbs, more proteins.  She is happily recovered from her recent influenza-like illness.   Past Medical History:  Diagnosis Date  . Abnormal mammogram 02/09/2016   Left breast distortion-?mass?-bilat breast calcifications: bx to be done as of 02/09/16  . Acute medial meniscus tear of right knee   . Anemia of chronic disease   . Anxiety   . Diabetes mellitus 1995   Managed by Dr. Elvera Lennox (Endo)  . GERD (gastroesophageal reflux disease) per pt watches diet and take tums as needed  . H/O hiatal hernia   . Hyperlipidemia   . Hypertension   . Interstitial cystitis   . Lichen sclerosus of female genitalia   . Migraine   . Osteoarthritis   . Psoriasis   . Seasonal allergies   . Seropositive rheumatoid arthritis (HCC)    Hands, knees, jaw, elbow: responding to Simponi as of 09/29/15 rheum f/u.  Waning effectiveness on 03/2016 f/u so pt switched to Orencia injections.  Chronic low-dose prednisone therapy+ methotrexate.    Past Surgical History:  Procedure Laterality Date  . ABDOMINAL HYSTERECTOMY  1991   for fibroids; ovaries still in  . BREAST BIOPSY  2001; 02/12/16   fibrocystic breast disease in 2001 and again in 02/2016 (+scar from prev bx site w/calcifications).  . COLONOSCOPY  09/12/2013   Recall 5 yrs ([facility], Dr. Evette Cristal) due to FH of colon polyps.  . CYSTOSCOPY  2004   for chronic UTI  . ESOPHAGOGASTRODUODENOSCOPY  04/19/2011   Nl except antral gastritis: bx = mild chron gastritis (H pyloria neg)   . intraarticular steroid injection  2013   3 R knee & L X 1; Dr Netta Corrigan  . KNEE ARTHROCENTESIS  2013   R knee x 3 & L X 1  . KNEE ARTHROSCOPY  10/21/2011   Procedure: ARTHROSCOPY KNEE;  Surgeon:  Jacki Cones, MD;  Location: [facility];  Service: Orthopedics;  Laterality: Right;  WITH MEDIAL and lateral shaving of femoral chondyl  with microfracture technique of lateral and medial femoral chondyl suprapatellar synovectomy  . TOTAL KNEE ARTHROPLASTY  04/12/2012   Procedure: TOTAL KNEE ARTHROPLASTY;  Surgeon: Jacki Cones, MD;  Location: [facility];  Service: Orthopedics;  Laterality: Right;  Right Total Knee Arthroplasty  . TUBAL LIGATION  1981    Family History  Problem Relation Age of Onset  . Diabetes Mother   . Stroke Mother 68  . Osteoarthritis Mother   . Stroke Brother 15  . Heart disease Father     CABG  . Lung cancer Father   . Prostate cancer Father   . Pancreatic cancer Father   . Pancreatic cancer Paternal Grandfather   . Bipolar disorder Daughter   . Anxiety disorder Son   . Arthritis Maternal Grandmother     rheumatoid  . Heart attack Brother 48    smoker    Social History   Social History  . Marital status: Married    Spouse name: N/A  . Number of children: N/A  . Years of education: N/A   Occupational History  . Not on file.   Social History Main Topics  . Smoking status: Never Smoker  .  Smokeless tobacco: Never Used  . Alcohol use No  . Drug use: No  . Sexual activity: Not on file   Other Topics Concern  . Not on file   Social History Narrative   Married, 3 children (2 in [place], 1 in [place]).  4 grandchildren.   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)   No tob/alc/drugs.   2 cups of coffee/day x 3 months   Regular exercise- no-due to arthritis.   Religion affecting care, "it allows stress management:"    Outpatient Medications Prior to Visit  Medication Sig Dispense Refill  . Abatacept (ORENCIA) 50 MG/0.4ML SOSY Inject 1 Dose into the skin once a week.    . cetirizine (ZYRTEC) 10 MG tablet Take 10 mg by mouth every evening.     . diazepam (VALIUM) 5 MG tablet Take 1 tablet (5 mg total) by mouth every 12  (twelve) hours as needed. Migraine headaches 30 tablet 0  . FOLIC ACID PO Take 1 mg by mouth daily.     . hydroxychloroquine (PLAQUENIL) 200 MG tablet Take 200 mg by mouth 2 (two) times daily. Added by rheum 12/23/15    . Insulin Detemir (LEVEMIR) 100 UNIT/ML Pen Inject 14 Units into the skin daily at 10 pm. 15 mL 2  . lisinopril (PRINIVIL,ZESTRIL) 20 MG tablet Take 0.5 tablets (10 mg total) by mouth daily. 90 tablet 3  . metFORMIN (GLUCOPHAGE) 500 MG tablet Take 2 tablets (1,000 mg total) by mouth 2 (two) times daily with a meal. 360 tablet 3  . Methotrexate Sodium (METHOTREXATE PO) Inject 0.8 mg as directed every Wednesday.     . Omega-3 Fatty Acids (FISH OIL) 1000 MG CAPS Take by mouth 2 (two) times daily.    . predniSONE (DELTASONE) 5 MG tablet Take 10 mg by mouth daily. Take 7.5 mg by mouth daily with breakfast x 1 month then 5mg     . primidone (MYSOLINE) 50 MG tablet Take 50 mg by mouth. 3 by mouth at bedtime    . ranitidine (ZANTAC) 75 MG tablet Take 75 mg by mouth 2 (two) times daily.    . rosuvastatin (CRESTOR) 10 MG tablet Take 1 tablet (10 mg total) by mouth daily. 90 tablet 0  . sertraline (ZOLOFT) 50 MG tablet Take 1.5 tablets (75 mg total) by mouth daily with breakfast. 135 tablet 3  . sitaGLIPtin (JANUVIA) 100 MG tablet Take 1 tablet (100 mg total) by mouth daily. 90 tablet 3  . triamcinolone (KENALOG) 0.025 % ointment Apply 1 application topically as needed.    oseltamivir (TAMIFLU) 75 MG capsule Take 1 capsule (75 mg total) by mouth 2 (two) times daily. 10 capsule 0   No facility-administered medications prior to visit.     Allergies  Allergen Reactions  . Penicillins Rash  . Pravastatin     Myalgias  . Simvastatin     Myalgias  . Hydrocodone-Acetaminophen Nausea And Vomiting    ROS Review of Systems  Constitutional: Negative for appetite change, chills, fatigue and fever.  HENT: Negative for congestion, dental problem, ear pain and sore throat.   Eyes: Negative  for discharge, redness and visual disturbance.  Respiratory: Negative for cough, chest tightness, shortness of breath and wheezing.   Cardiovascular: Negative for chest pain, palpitations and leg swelling.  Gastrointestinal: Negative for abdominal pain, blood in stool, diarrhea, nausea and vomiting.  Genitourinary: Negative for difficulty urinating, dysuria, flank pain, frequency, hematuria and urgency.  Musculoskeletal: Negative for arthralgias, back pain, joint swelling, myalgias and  neck stiffness.  Skin: Negative for pallor and rash.  Neurological: Negative for dizziness, speech difficulty, weakness and headaches.  Hematological: Negative for adenopathy. Does not bruise/bleed easily.  Psychiatric/Behavioral: Negative for confusion and sleep disturbance. The patient is not nervous/anxious.     PE; Blood pressure 97/67, pulse 68, temperature 97.6 F (36.4 C), temperature source Temporal, resp. rate 16, height 5\' 8"  (1.727 m), weight 200 lb (90.7 kg), SpO2 98 %.  Pt examined with Faythe Ghee, CMA, as chaperone.  Gen: Alert, well appearing.  Patient is oriented to person, place, time, and situation. AFFECT: pleasant, lucid thought and speech. ENT: Ears: EACs clear, normal epithelium.  TMs with good light reflex and landmarks bilaterally.  Eyes: no injection, icteris, swelling, or exudate.  EOMI, PERRLA. Nose: no drainage or turbinate edema/swelling.  No injection or focal lesion.  Mouth: lips without lesion/swelling.  Oral mucosa pink and moist.  Dentition intact and without obvious caries or gingival swelling.  Oropharynx without erythema, exudate, or swelling.  Neck: supple/nontender.  No LAD, mass, or TM.  Carotid pulses 2+ bilaterally, without bruits. CV: RRR, no m/r/g.   LUNGS: CTA bilat, nonlabored resps, good aeration in all lung fields. ABD: soft, NT, ND, BS normal.  No hepatospenomegaly or mass.  No bruits. EXT: no clubbing, cyanosis, or edema.  Musculoskeletal: no joint  swelling, erythema, warmth, or tenderness.  ROM of all joints intact. Skin - no sores or suspicious lesions or rashes or color changes   Pertinent labs:  Lab Results  Component Value Date   TSH 1.03 07/23/2016   Lab Results  Component Value Date   WBC 5.2 07/23/2016   HGB 11.0 (L) 07/23/2016   HCT 33.0 (L) 07/23/2016   MCV 81.5 07/23/2016   PLT 211.0 07/23/2016   Lab Results  Component Value Date   CREATININE 0.78 07/23/2016   BUN 11 07/23/2016   NA 140 07/23/2016   K 4.1 07/23/2016   CL 107 07/23/2016   CO2 24 07/23/2016   Lab Results  Component Value Date   ALT 21 07/23/2016   AST 19 07/23/2016   ALKPHOS 47 07/23/2016   BILITOT 0.4 07/23/2016   Lab Results  Component Value Date   CHOL 164 07/23/2016   Lab Results  Component Value Date   HDL 52.00 07/23/2016   Lab Results  Component Value Date   LDLCALC 99 07/23/2016   Lab Results  Component Value Date   TRIG 65.0 07/23/2016   Lab Results  Component Value Date   CHOLHDL 3 07/23/2016   Lab Results  Component Value Date   HGBA1C 6.5 07/26/2016   ASSESSMENT AND PLAN:   Health maintenance exam: Reviewed age and gender appropriate health maintenance issues (prudent diet, regular exercise, health risks of tobacco and excessive alcohol, use of seatbelts, fire alarms in home, use of sunscreen).  Also reviewed age and gender appropriate health screening as well as vaccine recommendations. Reviewed fasting labs with pt from 07/23/16--all stable. Colon cancer screening: next colonoscopy due 09/2018. Not a candidate for cervical cancer screening: hx of hysterectomy for benign reason. Breast ca screening: UTD. Osteoporosis screening: last DEXA 2015 and was normal:  Ordered repeat DEXA today.  An After Visit Summary was printed and given to the patient.  FOLLOW UP:  Return in about 6 months (around 02/13/2017) for routine chronic illness f/u.  Signed:  Santiago Bumpers, MD           08/16/2016

## 2016-08-20 ENCOUNTER — Ambulatory Visit (INDEPENDENT_AMBULATORY_CARE_PROVIDER_SITE_OTHER): Payer: PRIVATE HEALTH INSURANCE | Admitting: Internal Medicine

## 2016-08-20 ENCOUNTER — Encounter: Payer: Self-pay | Admitting: Internal Medicine

## 2016-08-20 VITALS — BP 138/80 | HR 82 | Wt 199.0 lb

## 2016-08-20 DIAGNOSIS — Z794 Long term (current) use of insulin: Secondary | ICD-10-CM | POA: Diagnosis not present

## 2016-08-20 DIAGNOSIS — E119 Type 2 diabetes mellitus without complications: Secondary | ICD-10-CM | POA: Diagnosis not present

## 2016-08-20 NOTE — Progress Notes (Signed)
Patient ID: Raven [name], female   DOB: Feb 17, 1956, 61 y.o.   MRN: [id number]  HPI: Raven [name] is a 61 y.o.-year-old female, returning for f/u for DM2, dx ~1995, insulin-dependent since 11/2013, controlled, without complications. Last visit 4 mo ago.  She and her husband changed her diet >> no concentrated sweets and less starches.   She had the flu 1 mo ago and prev. URI.  She had to increase her Prednisone to 10 mg for her RA >> now on 5 mg again.  Last hemoglobin A1c was: Lab Results  Component Value Date   HGBA1C 6.5 07/26/2016   HGBA1C 5.9 04/20/2016   HGBA1C 6.9 12/17/2015   She has RA >> was on Simponi (changed from Remicade) and MTX >> started Orencia and stays on MTX and Plaquenil. She was on Prednisone 10 mg since 05/12/2014 >> 7.5 mg>> 5 mg.   Pt is on a regimen of: - Levemir 14 units qhs - Metformin 1000 mg po bid - Januvia 100 mg daily in am >> started 04/2014  She tried Glimepiride in the past >> fluctuating blood sugar.  Pt checks her sugars 1x a day and they are: - am: 137-155, 203 >> 111-126, 136 >> 105-123 >> 94-110 >> 130-140 >> 97-120 >> 90s-110 - 2h after b'fast: n/c >> 137 >> 140, 155 >> 156-190 >> n/c >> 121-160, 180 >> 160s - before lunch: n/c >> 84-140 >> 84, 89 >> 120 >> n/c >> 103 >> 100-110 - 2h after lunch: 132-158 >> 128 >> 120-125, 250 if forgets insulin >> 91-165, 180 >> 140s - before dinner: n/c >> 82-143 >> 83-86 >> n/c >> 100-110 - 2h after dinner: 145-170, 182 >> 140s, 200s >> 87-171, 226 when misses meds >> up to 190 - bedtime:213-214 >> 95-105 >> 119, 130 >> n/c >> 97-159, 210 (missed meds) >> n/c - nighttime: n/c No lows. Lowest sugar was 87 >> 90s (had one episode of hypoglycemia at [facility] after eating a protein bar);  she has hypoglycemia awareness at 80.  Highest sugar was 359 >> 226.  Meter: ReliOn.  Pt's meals are: - Breakfast: scrambled eggs + bacon + tomatoes - Lunch: meat + 2 veggies + no bread unless burger -  Dinner: meat + 2 veggies - Snacks: 2: nuts; cheese;   - no CKD, last BUN/creatinine:  Lab Results  Component Value Date   BUN 11 07/23/2016   CREATININE 0.78 07/23/2016  On Lisinopril. - last set of lipids: Lab Results  Component Value Date   CHOL 164 07/23/2016   HDL 52.00 07/23/2016   LDLCALC 99 07/23/2016   LDLDIRECT 142.3 03/06/2008   TRIG 65.0 07/23/2016   CHOLHDL 3 07/23/2016  She was on statins but taken off b/c transaminitis in 10/2013. She was on Atorvastatin after this, stopped b/c mm cramps. Continues Crestor 10 mg >> no mm aches.  - last eye exam - 05/2016. No DR. + cataract. - no numbness and tingling in her feet.  I reviewed pt's medications, allergies, PMH, social hx, family hx, and changes were documented in the history of present illness. Otherwise, unchanged from my initial visit note.  ROS: Constitutional: no weight gain, no fatigue, no subjective hyperthermia/hypothermia Eyes: no blurry vision, no xerophthalmia ENT: no sore throat, no nodules palpated in throat, no dysphagia/odynophagia, no hoarseness Cardiovascular: no CP/SOB/palpitations/leg swelling Respiratory: no cough/SOB Gastrointestinal: no N/V/D/C/heartburn Musculoskeletal: no muscle/+ joint aches  (L elbow and foot) Skin: + rash (psoriasis), + hair loss  Neurological:  no  Tremors/numbness/tingling/dizziness, + HA  PE: BP 138/80 (BP Location: Left Arm, Patient Position: Sitting)   Pulse 82   Wt 199 lb (90.3 kg)   SpO2 97%   BMI 30.26 kg/m  Body mass index is 30.26 kg/m.  Wt Readings from Last 3 Encounters:  08/20/16 199 lb (90.3 kg)  08/16/16 200 lb (90.7 kg)  07/23/16 203 lb (92.1 kg)   Constitutional: overweight, in NAD Eyes: PERRLA, EOMI, no exophthalmos ENT: moist mucous membranes, no thyromegaly, no cervical lymphadenopathy Cardiovascular: RRR, No MRG Respiratory: CTA B Gastrointestinal: abdomen soft, NT, ND, BS+ Musculoskeletal: no deformities, strength intact in all 4 Skin:  moist, warm, no rashes Neurological: no tremor with outstretched hands, DTR normal in all 4  ASSESSMENT: 1. DM2, insulin-dependent, controlled, without complications  PLAN:  1. Patient with long-standing, uncontrolled diabetes, now with better control in last year. However, since last visit, she had the flu and URI and also, she has been on a higher Prednsone dose >> sugars were a little higher >> a HbA1c checked by PCP <1 mo ago was higher, at 6.6%. Sugars now improved after decreasing the Prednisone dose back to 5 mg. Will not change her regimen. - I suggested to:  Patient Instructions  Please continue: - Levemir 14 units at bedtime. - Metformin 1000 mg 2x a day - Januvia 100 mg daily in am   Please come back for a follow-up appointment in 4 months.  - continue checking sugars at different times of the day - check 1x times a day, rotating checks - advised for yearly eye exams >> she is UTD - Return to clinic in 4 mo with sugar log   Carlus Pavlov, MD PhD [facility]

## 2016-08-20 NOTE — Patient Instructions (Signed)
Please continue: - Levemir 14 units at bedtime. - Metformin 1000 mg 2x a day - Januvia 100 mg daily in am   Please come back for a follow-up appointment in 4 months.

## 2016-10-10 HISTORY — PX: OTHER SURGICAL HISTORY: SHX169

## 2016-11-09 ENCOUNTER — Ambulatory Visit
Admission: RE | Admit: 2016-11-09 | Discharge: 2016-11-09 | Disposition: A | Discharge status: Home / Self Care | Payer: PRIVATE HEALTH INSURANCE | Source: Ambulatory Visit | Attending: Family Medicine | Admitting: Family Medicine

## 2016-11-09 ENCOUNTER — Encounter: Payer: Self-pay | Admitting: Family Medicine

## 2016-11-09 DIAGNOSIS — E2839 Other primary ovarian failure: Secondary | ICD-10-CM

## 2016-11-09 DIAGNOSIS — Z7952 Long term (current) use of systemic steroids: Secondary | ICD-10-CM

## 2016-11-15 ENCOUNTER — Ambulatory Visit (INDEPENDENT_AMBULATORY_CARE_PROVIDER_SITE_OTHER): Payer: PRIVATE HEALTH INSURANCE | Admitting: Family Medicine

## 2016-11-15 ENCOUNTER — Encounter: Payer: Self-pay | Admitting: Family Medicine

## 2016-11-15 VITALS — BP 124/84 | HR 69 | Temp 98.4°F | Resp 16 | Ht 68.0 in | Wt 206.5 lb

## 2016-11-15 DIAGNOSIS — T887XXA Unspecified adverse effect of drug or medicament, initial encounter: Secondary | ICD-10-CM

## 2016-11-15 DIAGNOSIS — R300 Dysuria: Secondary | ICD-10-CM | POA: Diagnosis not present

## 2016-11-15 DIAGNOSIS — R197 Diarrhea, unspecified: Secondary | ICD-10-CM | POA: Diagnosis not present

## 2016-11-15 DIAGNOSIS — R3915 Urgency of urination: Secondary | ICD-10-CM | POA: Diagnosis not present

## 2016-11-15 LAB — POCT URINALYSIS DIPSTICK
Bilirubin, UA: NEGATIVE
Blood, UA: NEGATIVE
Glucose, UA: NEGATIVE
KETONES UA: NEGATIVE
Nitrite, UA: NEGATIVE
PROTEIN UA: NEGATIVE
Spec Grav, UA: 1.015 (ref 1.010–1.025)
UROBILINOGEN UA: 0.2 U/dL
pH, UA: 5 (ref 5.0–8.0)

## 2016-11-15 NOTE — Progress Notes (Signed)
OFFICE VISIT  11/15/2016   CC:  Chief Complaint  Patient presents with  . Diarrhea    x 2-3 weeks   HPI:    Patient is a 61 y.o. Caucasian female who presents for diarrhea. Onset 2-3 weeks ago, but says she has been on it off and on.  She thinks it may be her glucophage. Usually it goes on 1-2 weeks and stops.  She has watery/very soft BMs 4 times a day, imodium helps cuts this down to once per day.  No n/v.  Drinking plenty of fluids. No mucous or blood in it.  No different smell.  Gassy but no abd pain.  No fever/chills/malaise. No recent change in dosing of glucophage.    Urinary burning and urgency during this time as well.  No foul odor, urine looks clear. Has hx of interstitial cystitis.  Most recent AZO was 2 nights ago.  Of note, she has rheumatoid arthritis for which she takes methotrexate, orencia, and plaquenil---making her relatively immunosuppressed status.   Past Medical History:  Diagnosis Date  . Abnormal mammogram 02/09/2016   Left breast distortion-?mass?-bilat breast calcifications: bx to be done as of 02/09/16  . Acute medial meniscus tear of right knee   . Anemia of chronic disease   . Anxiety   . Diabetes mellitus 1995   Managed by Dr. Elvera Lennox (Endo)  . GERD (gastroesophageal reflux disease) per pt watches diet and take tums as needed  . H/O hiatal hernia   . Hyperlipidemia   . Hypertension   . Interstitial cystitis   . Lichen sclerosus of female genitalia   . Migraine   . Osteoarthritis   . Psoriasis   . Seasonal allergies   . Seropositive rheumatoid arthritis (HCC)    Hands, knees, jaw, elbow: responding to Simponi as of 09/29/15 rheum f/u.  Waning effectiveness on 03/2016 f/u so pt switched to Orencia injections.  Chronic low-dose prednisone therapy+ methotrexate.    Past Surgical History:  Procedure Laterality Date  . ABDOMINAL HYSTERECTOMY  1991   for fibroids; ovaries still in  . BREAST BIOPSY  2001; 02/12/16   fibrocystic breast disease in  2001 and again in 02/2016 (+scar from prev bx site w/calcifications).  . COLONOSCOPY  09/12/2013   Recall 5 yrs ([facility], Dr. Evette Cristal) due to FH of colon polyps.  Manfred Shirts   for chronic UTI  . DEXA  10/2016   Bone density normal (T-score 0.0)  . ESOPHAGOGASTRODUODENOSCOPY  04/19/2011   Nl except antral gastritis: bx = mild chron gastritis (H pyloria neg)   . intraarticular steroid injection  2013   3 R knee & L X 1; Dr Netta Corrigan  . KNEE ARTHROCENTESIS  2013   R knee x 3 & L X 1  . KNEE ARTHROSCOPY  10/21/2011   Procedure: ARTHROSCOPY KNEE;  Surgeon: Jacki Cones, MD;  Location: [facility];  Service: Orthopedics;  Laterality: Right;  WITH MEDIAL and lateral shaving of femoral chondyl  with microfracture technique of lateral and medial femoral chondyl suprapatellar synovectomy  . TOTAL KNEE ARTHROPLASTY  04/12/2012   Procedure: TOTAL KNEE ARTHROPLASTY;  Surgeon: Jacki Cones, MD;  Location: [facility];  Service: Orthopedics;  Laterality: Right;  Right Total Knee Arthroplasty  . TUBAL LIGATION  1981    Outpatient Medications Prior to Visit  Medication Sig Dispense Refill  . Abatacept (ORENCIA) 50 MG/0.4ML SOSY Inject 1 Dose into the skin once a week.    . cetirizine (  ZYRTEC) 10 MG tablet Take 10 mg by mouth every evening.     . diazepam (VALIUM) 5 MG tablet Take 1 tablet (5 mg total) by mouth every 12 (twelve) hours as needed. Migraine headaches 30 tablet 0  . FOLIC ACID PO Take 1 mg by mouth daily.     . hydroxychloroquine (PLAQUENIL) 200 MG tablet Take 200 mg by mouth 2 (two) times daily. Added by rheum 12/23/15    . Insulin Detemir (LEVEMIR) 100 UNIT/ML Pen Inject 14 Units into the skin daily at 10 pm. 15 mL 2  . lisinopril (PRINIVIL,ZESTRIL) 20 MG tablet Take 0.5 tablets (10 mg total) by mouth daily. 45 tablet 3  . metFORMIN (GLUCOPHAGE) 500 MG tablet Take 2 tablets (1,000 mg total) by mouth 2 (two) times daily with a meal. 360 tablet 3  . Methotrexate Sodium  (METHOTREXATE PO) Inject 0.8 mg as directed every Wednesday.     . Omega-3 Fatty Acids (FISH OIL) 1000 MG CAPS Take by mouth 2 (two) times daily.    . predniSONE (DELTASONE) 5 MG tablet Take 10 mg by mouth daily. Take 7.5 mg by mouth daily with breakfast x 1 month then 5mg     . primidone (MYSOLINE) 50 MG tablet Take 50 mg by mouth. 3 by mouth at bedtime    . ranitidine (ZANTAC) 75 MG tablet Take 75 mg by mouth 2 (two) times daily.    . rosuvastatin (CRESTOR) 10 MG tablet Take 1 tablet (10 mg total) by mouth daily. 90 tablet 3  . sertraline (ZOLOFT) 50 MG tablet Take 1.5 tablets (75 mg total) by mouth daily with breakfast. 135 tablet 3  . sitaGLIPtin (JANUVIA) 100 MG tablet Take 1 tablet (100 mg total) by mouth daily. 90 tablet 3  . triamcinolone (KENALOG) 0.025 % ointment Apply 1 application topically as needed.    . vitamin E (VITAMIN E) 400 UNIT capsule Take 400 Units by mouth daily.     No facility-administered medications prior to visit.     Allergies  Allergen Reactions  . Penicillins Rash  . Pravastatin     Myalgias  . Simvastatin     Myalgias  . Hydrocodone-Acetaminophen Nausea And Vomiting    ROS As per HPI  PE: Blood pressure 124/84, pulse 69, temperature 98.4 F (36.9 C), temperature source Oral, resp. rate 16, height 5\' 8"  (1.727 m), weight 206 lb 8 oz (93.7 kg), SpO2 99 %. Gen: Alert, well appearing.  Patient is oriented to person, place, time, and situation. : no injection, icteris, swelling, or exudate.  EOMI, PERRLA. Mouth: lips without lesion/swelling.  Oral mucosa pink and moist. Oropharynx without erythema, exudate, or swelling.  CV: RRR, no m/r/g.   LUNGS: CTA bilat, nonlabored resps, good aeration in all lung fields. ABD: soft, NT, ND, BS normal.  No hepatospenomegaly or mass.  No bruits. EXT: no clubbing, cyanosis, or edema.    LABS:    Chemistry      Component Value Date/Time   NA 140 07/23/2016 1011   K 4.1 07/23/2016 1011   CL 107  07/23/2016 1011   CO2 24 07/23/2016 1011   BUN 11 07/23/2016 1011   CREATININE 0.78 07/23/2016 1011      Component Value Date/Time   CALCIUM 9.0 07/23/2016 1011   ALKPHOS 47 07/23/2016 1011   AST 19 07/23/2016 1011   ALT 21 07/23/2016 1011   BILITOT 0.4 07/23/2016 1011     Lab Results  Component Value Date   HGBA1C 6.5 07/26/2016  CC UA today: trace leu, o/w normal.  IMPRESSION AND PLAN:  1) Diarrhea, suspected side effect from glucophage. Discussed options today and pt chose to decrease glucophage dosing to 500mg  bid and see how symptoms respond.  She will continue use of imodium to decrease frequency of diarrhea.  Push fluids. Watch for new s/s such as those of infection. Continue current immunosuppressant meds for now.  2) Urinary ugency and dysuria: hx of interstitial cystitis. UA unremarkable today but sent for c/s for completeness. Likely a flare of her interstitial cystitis.  An After Visit Summary was printed and given to the patient.  FOLLOW UP: Return in about 3 weeks (around 12/06/2016) for f/u diarrhea.  Signed:  Santiago Bumpers, MD           11/15/2016

## 2016-11-15 NOTE — Progress Notes (Signed)
Pre visit review using our clinic review tool, if applicable. No additional management support is needed unless otherwise documented below in the visit note. 

## 2016-11-15 NOTE — Patient Instructions (Signed)
Decrease your glucophage/metformin to one 500mg  tab twice daily.

## 2016-11-17 LAB — URINE CULTURE

## 2016-11-18 ENCOUNTER — Other Ambulatory Visit: Payer: Self-pay | Admitting: *Deleted

## 2016-11-18 MED ORDER — CIPROFLOXACIN HCL 500 MG PO TABS: 500.0000 mg | ORAL_TABLET | Freq: Two times a day (BID) | ORAL | 0 refills | Status: DC

## 2016-11-25 LAB — CLOSTRIDIUM DIFFICILE BY PCR: Toxigenic C. Difficile by PCR: NOT DETECTED

## 2016-11-25 LAB — FECAL LACTOFERRIN, QUANT: Lactoferrin: NEGATIVE

## 2016-11-28 LAB — STOOL CULTURE

## 2016-11-30 LAB — GIARDIA/CRYPTOSPORIDIUM (EIA)

## 2016-12-02 ENCOUNTER — Ambulatory Visit: Payer: PRIVATE HEALTH INSURANCE | Admitting: Family Medicine

## 2016-12-03 ENCOUNTER — Encounter: Payer: Self-pay | Admitting: Family Medicine

## 2016-12-03 ENCOUNTER — Ambulatory Visit (INDEPENDENT_AMBULATORY_CARE_PROVIDER_SITE_OTHER): Payer: PRIVATE HEALTH INSURANCE | Admitting: Family Medicine

## 2016-12-03 VITALS — BP 100/60 | HR 74 | Temp 98.2°F | Resp 16 | Ht 68.0 in | Wt 206.5 lb

## 2016-12-03 DIAGNOSIS — N3001 Acute cystitis with hematuria: Secondary | ICD-10-CM

## 2016-12-03 DIAGNOSIS — T887XXA Unspecified adverse effect of drug or medicament, initial encounter: Secondary | ICD-10-CM

## 2016-12-03 DIAGNOSIS — R3915 Urgency of urination: Secondary | ICD-10-CM

## 2016-12-03 DIAGNOSIS — R197 Diarrhea, unspecified: Secondary | ICD-10-CM | POA: Diagnosis not present

## 2016-12-03 LAB — POC URINALSYSI DIPSTICK (AUTOMATED)
Bilirubin, UA: NEGATIVE
Glucose, UA: NEGATIVE
KETONES UA: NEGATIVE
Nitrite, UA: NEGATIVE
PROTEIN UA: NEGATIVE
SPEC GRAV UA: 1.015 (ref 1.010–1.025)
UROBILINOGEN UA: 0.2 U/dL
pH, UA: 5.5 (ref 5.0–8.0)

## 2016-12-03 MED ORDER — CIPROFLOXACIN HCL 500 MG PO TABS: ORAL_TABLET | ORAL | 0 refills | Status: DC

## 2016-12-03 NOTE — Progress Notes (Signed)
OFFICE VISIT  12/03/2016   CC:  Chief Complaint  Patient presents with  . Follow-up    diarrhea, improved but still UTI symptoms     HPI:    Patient is a 61 y.o. Caucasian female who presents for 3 week f/u diarrhea. Felt like this was from glucophage so we decreased her dose to 500 mg bid. She had a UTI--treated with cipro 500 bid x 5d. All stool studies negative.  Diarrhea is better: has one-two loose bm's per day.  More solid-not watery. Glucoses not remarkably different on lower dose of metformin.  Urinary sx's persist: urgency and dysuria.  She has not taken AZO. The sx's decrease with the cipro but did not go away, then after cipro finished sx's returned to pre-treatment level.    Past Medical History:  Diagnosis Date  . Abnormal mammogram 02/09/2016   Left breast distortion-?mass?-bilat breast calcifications: bx to be done as of 02/09/16  . Acute medial meniscus tear of right knee   . Anemia of chronic disease   . Anxiety   . Diabetes mellitus 1995   Managed by Dr. Elvera Lennox (Endo)  . GERD (gastroesophageal reflux disease) per pt watches diet and take tums as needed  . H/O hiatal hernia   . Hyperlipidemia   . Hypertension   . Interstitial cystitis   . Lichen sclerosus of female genitalia   . Migraine   . Osteoarthritis   . Psoriasis   . Seasonal allergies   . Seropositive rheumatoid arthritis (HCC)    Hands, knees, jaw, elbow: responding to Simponi as of 09/29/15 rheum f/u.  Waning effectiveness on 03/2016 f/u so pt switched to Orencia injections.  Chronic low-dose prednisone therapy+ methotrexate.    Past Surgical History:  Procedure Laterality Date  . ABDOMINAL HYSTERECTOMY  1991   for fibroids; ovaries still in  . BREAST BIOPSY  2001; 02/12/16   fibrocystic breast disease in 2001 and again in 02/2016 (+scar from prev bx site w/calcifications).  . COLONOSCOPY  09/12/2013   Recall 5 yrs ([facility], Dr. Evette Cristal) due to FH of colon polyps.  Manfred Shirts   for chronic UTI  . DEXA  10/2016   Bone density normal (T-score 0.0)  . ESOPHAGOGASTRODUODENOSCOPY  04/19/2011   Nl except antral gastritis: bx = mild chron gastritis (H pyloria neg)   . intraarticular steroid injection  2013   3 R knee & L X 1; Dr Netta Corrigan  . KNEE ARTHROCENTESIS  2013   R knee x 3 & L X 1  . KNEE ARTHROSCOPY  10/21/2011   Procedure: ARTHROSCOPY KNEE;  Surgeon: Jacki Cones, MD;  Location: [facility]: [facility];  Service: Orthopedics;  Laterality: Right;  WITH MEDIAL and lateral shaving of femoral chondyl  with microfracture technique of lateral and medial femoral chondyl suprapatellar synovectomy  . TOTAL KNEE ARTHROPLASTY  04/12/2012   Procedure: TOTAL KNEE ARTHROPLASTY;  Surgeon: Jacki Cones, MD;  Location: [facility];  Service: Orthopedics;  Laterality: Right;  Right Total Knee Arthroplasty  . TUBAL LIGATION  1981    Outpatient Medications Prior to Visit  Medication Sig Dispense Refill  . Abatacept (ORENCIA) 50 MG/0.4ML SOSY Inject 1 Dose into the skin once a week.    . cetirizine (ZYRTEC) 10 MG tablet Take 10 mg by mouth every evening.     . diazepam (VALIUM) 5 MG tablet Take 1 tablet (5 mg total) by mouth every 12 (twelve) hours as needed. Migraine headaches 30 tablet 0  .  FOLIC ACID PO Take 1 mg by mouth daily.     . hydroxychloroquine (PLAQUENIL) 200 MG tablet Take 200 mg by mouth 2 (two) times daily. Added by rheum 12/23/15    . Insulin Detemir (LEVEMIR) 100 UNIT/ML Pen Inject 14 Units into the skin daily at 10 pm. 15 mL 2  . lisinopril (PRINIVIL,ZESTRIL) 20 MG tablet Take 0.5 tablets (10 mg total) by mouth daily. 45 tablet 3  . metFORMIN (GLUCOPHAGE) 500 MG tablet Take 2 tablets (1,000 mg total) by mouth 2 (two) times daily with a meal. 360 tablet 3  . Methotrexate Sodium (METHOTREXATE PO) Inject 0.8 mg as directed every Wednesday.     . Omega-3 Fatty Acids (FISH OIL) 1000 MG CAPS Take by mouth 2 (two) times daily.    . predniSONE (DELTASONE) 5 MG tablet  Take 10 mg by mouth daily. Take 7.5 mg by mouth daily with breakfast x 1 month then 5mg     . primidone (MYSOLINE) 50 MG tablet Take 50 mg by mouth. 3 by mouth at bedtime    . ranitidine (ZANTAC) 75 MG tablet Take 75 mg by mouth 2 (two) times daily.    . rosuvastatin (CRESTOR) 10 MG tablet Take 1 tablet (10 mg total) by mouth daily. 90 tablet 3  . sertraline (ZOLOFT) 50 MG tablet Take 1.5 tablets (75 mg total) by mouth daily with breakfast. 135 tablet 3  . sitaGLIPtin (JANUVIA) 100 MG tablet Take 1 tablet (100 mg total) by mouth daily. 90 tablet 3  . triamcinolone (KENALOG) 0.025 % ointment Apply 1 application topically as needed.    . vitamin E (VITAMIN E) 400 UNIT capsule Take 400 Units by mouth daily.    . ciprofloxacin (CIPRO) 500 MG tablet Take 1 tablet (500 mg total) by mouth 2 (two) times daily. (Patient not taking: Reported on 12/03/2016) 10 tablet 0   No facility-administered medications prior to visit.     Allergies  Allergen Reactions  . Penicillins Rash  . Pravastatin     Myalgias  . Simvastatin     Myalgias  . Hydrocodone-Acetaminophen Nausea And Vomiting    ROS As per HPI  PE: Blood pressure 100/60, pulse 74, temperature 98.2 F (36.8 C), temperature source Oral, resp. rate 16, height 5\' 8"  (1.727 m), weight 206 lb 8 oz (93.7 kg), SpO2 98 %. Gen: Alert, well appearing.  Patient is oriented to person, place, time, and situation. AFFECT: pleasant, lucid thought and speech. No further exam today.  LABS:  CC UA today: trace lysed blood, small LEU, otherwise normal.  IMPRESSION AND PLAN:  1) Med-induced diarrhea (metformin): much improved/tolerable on lower dose metformin.  Continue 500 mg bid. Glucoses seem stable per pt.  2) UTI: still with significant symptoms.  Urine grew E coli sensitive to cipro last o/v and she took 5d of cipro. Pt states it commonly takes longer/double course of abx to treat her UTIs. UA today mildly abnl.  Send for c/s, start cipro 500 mg  bid x 10d now. If clx neg, suspect interstitial cystitis flare and pt states just taking AZO helps this.  An After Visit Summary was printed and given to the patient.  FOLLOW UP: Return if symptoms worsen or fail to improve.  Signed:  12/05/2016, MD           12/03/2016

## 2016-12-04 LAB — URINE CULTURE

## 2016-12-07 ENCOUNTER — Encounter: Payer: Self-pay | Admitting: *Deleted

## 2016-12-17 ENCOUNTER — Encounter: Payer: Self-pay | Admitting: Internal Medicine

## 2016-12-17 ENCOUNTER — Ambulatory Visit (INDEPENDENT_AMBULATORY_CARE_PROVIDER_SITE_OTHER): Payer: PRIVATE HEALTH INSURANCE | Admitting: Internal Medicine

## 2016-12-17 VITALS — BP 128/78 | HR 85 | Ht 68.0 in | Wt 204.0 lb

## 2016-12-17 DIAGNOSIS — Z794 Long term (current) use of insulin: Secondary | ICD-10-CM

## 2016-12-17 DIAGNOSIS — E119 Type 2 diabetes mellitus without complications: Secondary | ICD-10-CM

## 2016-12-17 LAB — POCT GLYCOSYLATED HEMOGLOBIN (HGB A1C): Hemoglobin A1C: 6

## 2016-12-17 MED ORDER — METFORMIN HCL ER 500 MG PO TB24: 1000.0000 mg | ORAL_TABLET | Freq: Two times a day (BID) | ORAL | 3 refills | Status: DC

## 2016-12-17 NOTE — Progress Notes (Signed)
Patient ID: Raven [name], female   DOB: 06/19/1956, 61 y.o.   MRN: [id number]  HPI: Raven [name] is a 61 y.o.-year-old female, returning for f/u for DM2, dx ~1995, insulin-dependent since 11/2013, controlled, without complications. Last visit 5 mo ago.  She is on Prednisone for RA >> now on 5 mg daily.  Last hemoglobin A1c was: Lab Results  Component Value Date   HGBA1C 6.5 07/26/2016   HGBA1C 5.9 04/20/2016   HGBA1C 6.9 12/17/2015   She has RA >> was on Simponi (changed from Remicade) and MTX >> started Orencia and stays on MTX and Plaquenil. She was on Prednisone 10 mg since 05/12/2014 >> 7.5 mg >> 5 mg - continues this   Pt is on a regimen of: - Levemir 14 units qhs - Metformin 1000 mg po bid - Januvia 100 mg daily in am >> started 04/2014  She tried Glimepiride in the past >> fluctuating blood sugar.  Pt checks her sugars 1x a day and they are: - am: 137-155, 203 >> 111-126, 136 >> 105-123 >> 94-110 >> 130-140 >> 97-120 >> 90s-110 >> 90s-110 - 2h after b'fast: n/c >> 137 >> 140, 155 >> 156-190 >> n/c >> 121-160, 180 >> 160s >> 190s - before lunch: n/c >> 84-140 >> 84, 89 >> 120 >> n/c >> 103 >> 100-110 >> 100-110 - 2h after lunch: 132-158 >> 128 >> 120-125, 250 if forgets insulin >> 91-165, 180 >> 140s >> n/c - before dinner: n/c >> 82-143 >> 83-86 >> n/c >> 100-110 >> n/c - 2h after dinner: 145-170, 182 >> 140s, 200s >> 87-171, 226 when misses meds >> up to 190 >> 140-190s - bedtime:213-214 >> 95-105 >> 119, 130 >> n/c >> 97-159, 210 (missed meds) >> n/c >> 110-120 - nighttime: n/c No lows. Lowest sugar was 87 >> 90s >> 90s in am;  she has hypoglycemia awareness at 80.  Highest sugar was 359 >> 226 >> 220.  Meter: ReliOn.  Pt's meals are: - Breakfast: scrambled eggs + bacon + tomatoes - Lunch: meat + 2 veggies + no bread unless burger - Dinner: meat + 2 veggies - Snacks: 2: nuts; cheese;   - No CKD, last BUN/creatinine:  Lab Results  Component Value Date    BUN 11 07/23/2016   CREATININE 0.78 07/23/2016  On Lisinopril. - last set of lipids: Lab Results  Component Value Date   CHOL 164 07/23/2016   HDL 52.00 07/23/2016   LDLCALC 99 07/23/2016   LDLDIRECT 142.3 03/06/2008   TRIG 65.0 07/23/2016   CHOLHDL 3 07/23/2016  She was on statins but taken off b/c transaminitis in 10/2013. She was on Atorvastatin >> mm cramps.Continues Crestor 10 mg >> no mm aches. - last eye exam - 05/2016. No DR. + cataract. - she denies numbness and tingling in her feet.  I reviewed pt's medications, allergies, PMH, social hx, family hx, and changes were documented in the history of present illness. Otherwise, unchanged from my initial visit note.  ROS: Constitutional: + weight gain, + fatigue, no subjective hyperthermia, no subjective hypothermia Eyes: no blurry vision, no xerophthalmia ENT: no sore throat, no nodules palpated in throat, no dysphagia, no odynophagia, no hoarseness Cardiovascular: no CP/no SOB/no palpitations/no leg swelling Respiratory: no cough/no SOB/no wheezing Gastrointestinal: no N/no V/+ D/no C/no acid reflux Musculoskeletal: + muscle aches/+ joint aches Skin: + rash (psoriasis), + hair loss  Neurological: no tremors/no numbness/no tingling/no dizziness  I reviewed pt's medications, allergies, PMH, social hx,  family hx, and changes were documented in the history of present illness. Otherwise, unchanged from my initial visit note.  PE: BP 128/78 (BP Location: Left Arm, Patient Position: Sitting)   Pulse 85   Ht 5\' 8"  (1.727 m)   Wt 204 lb (92.5 kg)   SpO2 98%   BMI 31.02 kg/m  Body mass index is 31.02 kg/m.  Wt Readings from Last 3 Encounters:  12/17/16 204 lb (92.5 kg)  12/03/16 206 lb 8 oz (93.7 kg)  11/15/16 206 lb 8 oz (93.7 kg)   Constitutional: overweight, in NAD Eyes: PERRLA, EOMI, no exophthalmos ENT: moist mucous membranes, no thyromegaly, no cervical lymphadenopathy Cardiovascular: RRR, No MRG Respiratory: CTA  B Gastrointestinal: abdomen soft, NT, ND, BS+ Musculoskeletal: no deformities, strength intact in all 4 Skin: moist, warm, no rashes Neurological: no tremor with outstretched hands, DTR normal in all 4   ASSESSMENT: 1. DM2, insulin-dependent, controlled, without complications  PLAN:  1. Patient with long-standing, uncontrolled diabetes, now with better control in last year. Since last visit, her sugars are great before meals, but higher after meals (has some 190s). She had 2 courses of ABx since last visit >> possibly the source of the higher sugars.  - she has diarrhea >> will try to change to Metformin ER 1000 mg 2x a day and if not better >> will decrease the dose to 500 mg bid.  - I suggested to:  Patient Instructions  Please continue: - Levemir 14 units at bedtime. - Januvia 100 mg daily in am   Please stop regular Metformin and start Metformin ER 1000 mg 2x a day with meals. If diarrhea is not better, then try to reduce the dose to 500 mg 2x a day.  Please come back for a follow-up appointment in 4 months.  - today, HbA1c is 6% (better) - continue checking sugars at different times of the day - check 1x a day, rotating checks - advised for yearly eye exams >> she is UTD - Return to clinic in 4 mo with sugar log   01/15/17, MD PhD [facility] Endocrinology

## 2016-12-17 NOTE — Addendum Note (Signed)
Addended by: Darene Lamer T on: 12/17/2016 10:32 AM   Modules accepted: Orders

## 2016-12-17 NOTE — Patient Instructions (Addendum)
Please continue: - Levemir 14 units at bedtime. - Januvia 100 mg daily in am   Please stop regular Metformin and start Metformin ER 1000 mg 2x a day with meals. If diarrhea is not better, then try to reduce the dose to 500 mg 2x a day.  Please come back for a follow-up appointment in 4 months.

## 2016-12-20 ENCOUNTER — Other Ambulatory Visit: Payer: Self-pay

## 2016-12-20 MED ORDER — METFORMIN HCL ER 500 MG PO TB24: 1000.0000 mg | ORAL_TABLET | Freq: Two times a day (BID) | ORAL | 3 refills | Status: DC

## 2016-12-23 ENCOUNTER — Other Ambulatory Visit: Payer: Self-pay

## 2016-12-23 MED ORDER — METFORMIN HCL ER 500 MG PO TB24: 1000.0000 mg | ORAL_TABLET | Freq: Two times a day (BID) | ORAL | 3 refills | Status: DC

## 2016-12-28 ENCOUNTER — Other Ambulatory Visit: Payer: Self-pay

## 2016-12-28 MED ORDER — METFORMIN HCL ER 500 MG PO TB24: 1000.0000 mg | ORAL_TABLET | Freq: Two times a day (BID) | ORAL | 3 refills | Status: DC

## 2017-02-04 ENCOUNTER — Encounter: Payer: Self-pay | Admitting: Family Medicine

## 2017-02-04 ENCOUNTER — Ambulatory Visit (INDEPENDENT_AMBULATORY_CARE_PROVIDER_SITE_OTHER): Payer: PRIVATE HEALTH INSURANCE | Admitting: Family Medicine

## 2017-02-04 VITALS — BP 101/64 | HR 75 | Temp 98.2°F | Resp 16 | Ht 68.0 in | Wt 209.2 lb

## 2017-02-04 DIAGNOSIS — N3000 Acute cystitis without hematuria: Secondary | ICD-10-CM

## 2017-02-04 DIAGNOSIS — R300 Dysuria: Secondary | ICD-10-CM

## 2017-02-04 LAB — POC URINALSYSI DIPSTICK (AUTOMATED)
Bilirubin, UA: NEGATIVE
Glucose, UA: NEGATIVE
Ketones, UA: NEGATIVE
Nitrite, UA: POSITIVE
PROTEIN UA: NEGATIVE
RBC UA: NEGATIVE
Spec Grav, UA: 1.02 (ref 1.010–1.025)
UROBILINOGEN UA: 0.2 U/dL
pH, UA: 5.5 (ref 5.0–8.0)

## 2017-02-04 MED ORDER — CIPROFLOXACIN HCL 500 MG PO TABS: 500.0000 mg | ORAL_TABLET | Freq: Two times a day (BID) | ORAL | 0 refills | Status: AC

## 2017-02-04 NOTE — Progress Notes (Signed)
OFFICE VISIT  02/04/2017   CC:  Chief Complaint  Patient presents with  . Urinary Tract Infection   HPI:    Patient is a 61 y.o. Caucasian female who presents for urinary symptoms. Has hx of interstitial cystitis. Most recent UTI sx's showed NEG urine clx 12/03/16. However, she had an e coli UTI 11/15/16--resistant to penicillins and bactrim.  Reports onset over a week ago--dysuria, urinary frequency, urgency, lower abd/suprapubic pain.   AZO standard not helping--most recent dosing was yesterday afternoon.  ibup no help.   Has constant feeling of needing to urinate.  No gross hematuria.  No incomplete emptying. No fever.  No signif nausea.  No flank or CVA region pain.  Past Medical History:  Diagnosis Date  . Abnormal mammogram 02/09/2016   Left breast distortion-?mass?-bilat breast calcifications: bx to be done as of 02/09/16  . Acute medial meniscus tear of right knee   . Anemia of chronic disease   . Anxiety   . Diabetes mellitus 1995   Managed by Dr. Elvera Lennox (Endo)  . GERD (gastroesophageal reflux disease) per pt watches diet and take tums as needed  . H/O hiatal hernia   . Hyperlipidemia   . Hypertension   . Interstitial cystitis   . Lichen sclerosus of female genitalia   . Migraine   . Osteoarthritis   . Psoriasis   . Seasonal allergies   . Seropositive rheumatoid arthritis (HCC)    Hands, knees, jaw, elbow: responding to Simponi as of 09/29/15 rheum f/u.  Waning effectiveness on 03/2016 f/u so pt switched to Orencia injections.  Chronic low-dose prednisone therapy+ methotrexate.    Past Surgical History:  Procedure Laterality Date  . ABDOMINAL HYSTERECTOMY  1991   for fibroids; ovaries still in  . BREAST BIOPSY  2001; 02/12/16   fibrocystic breast disease in 2001 and again in 02/2016 (+scar from prev bx site w/calcifications).  . COLONOSCOPY  09/12/2013   Recall 5 yrs ([facility], Dr. Evette Cristal) due to FH of colon polyps.  Manfred Shirts   for chronic UTI  . DEXA   10/2016   Bone density normal (T-score 0.0)  . ESOPHAGOGASTRODUODENOSCOPY  04/19/2011   Nl except antral gastritis: bx = mild chron gastritis (H pyloria neg)   . intraarticular steroid injection  2013   3 R knee & L X 1; Dr Netta Corrigan  . KNEE ARTHROCENTESIS  2013   R knee x 3 & L X 1  . KNEE ARTHROSCOPY  10/21/2011   Procedure: ARTHROSCOPY KNEE;  Surgeon: Jacki Cones, MD;  Location: [facility];  Service: Orthopedics;  Laterality: Right;  WITH MEDIAL and lateral shaving of femoral chondyl  with microfracture technique of lateral and medial femoral chondyl suprapatellar synovectomy  . TOTAL KNEE ARTHROPLASTY  04/12/2012   Procedure: TOTAL KNEE ARTHROPLASTY;  Surgeon: Jacki Cones, MD;  Location: [facility];  Service: Orthopedics;  Laterality: Right;  Right Total Knee Arthroplasty  . TUBAL LIGATION  1981    Outpatient Medications Prior to Visit  Medication Sig Dispense Refill  . Abatacept (ORENCIA) 50 MG/0.4ML SOSY Inject 1 Dose into the skin once a week.    . cetirizine (ZYRTEC) 10 MG tablet Take 10 mg by mouth every evening.     . diazepam (VALIUM) 5 MG tablet Take 1 tablet (5 mg total) by mouth every 12 (twelve) hours as needed. Migraine headaches 30 tablet 0  . FOLIC ACID PO Take 1 mg by mouth daily.     Marland Kitchen  hydroxychloroquine (PLAQUENIL) 200 MG tablet Take 200 mg by mouth 2 (two) times daily. Added by rheum 12/23/15    . Insulin Detemir (LEVEMIR) 100 UNIT/ML Pen Inject 14 Units into the skin daily at 10 pm. 15 mL 2  . lisinopril (PRINIVIL,ZESTRIL) 20 MG tablet Take 0.5 tablets (10 mg total) by mouth daily. 45 tablet 3  . metFORMIN (GLUCOPHAGE-XR) 500 MG 24 hr tablet Take 2 tablets (1,000 mg total) by mouth 2 (two) times daily with a meal. 360 tablet 3  . Methotrexate Sodium (METHOTREXATE PO) Inject 0.8 mg as directed every Wednesday.     . Omega-3 Fatty Acids (FISH OIL) 1000 MG CAPS Take by mouth 2 (two) times daily.    . predniSONE (DELTASONE) 5 MG tablet Take 10 mg by  mouth daily. Take 7.5 mg by mouth daily with breakfast x 1 month then 5mg     . primidone (MYSOLINE) 50 MG tablet Take 50 mg by mouth. 3 by mouth at bedtime    . ranitidine (ZANTAC) 75 MG tablet Take 75 mg by mouth 2 (two) times daily.    . rosuvastatin (CRESTOR) 10 MG tablet Take 1 tablet (10 mg total) by mouth daily. 90 tablet 3  . sertraline (ZOLOFT) 50 MG tablet Take 1.5 tablets (75 mg total) by mouth daily with breakfast. 135 tablet 3  . sitaGLIPtin (JANUVIA) 100 MG tablet Take 1 tablet (100 mg total) by mouth daily. 90 tablet 3  . triamcinolone (KENALOG) 0.025 % ointment Apply 1 application topically as needed.    . vitamin E (VITAMIN E) 400 UNIT capsule Take 400 Units by mouth daily.     No facility-administered medications prior to visit.     Allergies  Allergen Reactions  . Penicillins Rash  . Pravastatin     Myalgias  . Simvastatin     Myalgias  . Hydrocodone-Acetaminophen Nausea And Vomiting    ROS As per HPI  PE: Blood pressure 101/64, pulse 75, temperature 98.2 F (36.8 C), temperature source Oral, resp. rate 16, height 5\' 8"  (1.727 m), weight 209 lb 4 oz (94.9 kg), SpO2 99 %. Gen: Alert, well appearing.  Patient is oriented to person, place, time, and situation. AFFECT: pleasant, lucid thought and speech. No further exam today.  LABS:    Chemistry      Component Value Date/Time   NA 140 07/23/2016 1011   K 4.1 07/23/2016 1011   CL 107 07/23/2016 1011   CO2 24 07/23/2016 1011   BUN 11 07/23/2016 1011   CREATININE 0.78 07/23/2016 1011      Component Value Date/Time   CALCIUM 9.0 07/23/2016 1011   ALKPHOS 47 07/23/2016 1011   AST 19 07/23/2016 1011   ALT 21 07/23/2016 1011   BILITOT 0.4 07/23/2016 1011     CC UA today: urine yellow, +nitrite and LEU, otherwise normal.  IMPRESSION AND PLAN:  Acute cystitis; favor infectious over interstitial cystitis. Cipro 500 mg bid x 5d eRx'd today. Sent urine for c/s. Encouraged pt to think about seeing a  urologist again for further evaluation/treatment of her interstitial cystitis---she has not seen one in "YEARS".  An After Visit Summary was printed and given to the patient.  FOLLOW UP: Return if symptoms worsen or fail to improve.  Signed:  09/20/2016, MD           02/04/2017

## 2017-02-07 LAB — URINE CULTURE

## 2017-02-10 ENCOUNTER — Other Ambulatory Visit (INDEPENDENT_AMBULATORY_CARE_PROVIDER_SITE_OTHER): Payer: PRIVATE HEALTH INSURANCE

## 2017-02-10 ENCOUNTER — Other Ambulatory Visit: Payer: Self-pay | Admitting: *Deleted

## 2017-02-10 DIAGNOSIS — R319 Hematuria, unspecified: Secondary | ICD-10-CM | POA: Diagnosis not present

## 2017-02-10 DIAGNOSIS — N390 Urinary tract infection, site not specified: Secondary | ICD-10-CM

## 2017-02-11 ENCOUNTER — Encounter: Payer: Self-pay | Admitting: Family Medicine

## 2017-02-11 ENCOUNTER — Ambulatory Visit (INDEPENDENT_AMBULATORY_CARE_PROVIDER_SITE_OTHER): Payer: PRIVATE HEALTH INSURANCE | Admitting: Family Medicine

## 2017-02-11 VITALS — BP 128/83 | HR 71 | Temp 98.4°F | Resp 16 | Wt 207.0 lb

## 2017-02-11 DIAGNOSIS — E7800 Pure hypercholesterolemia, unspecified: Secondary | ICD-10-CM

## 2017-02-11 DIAGNOSIS — N3000 Acute cystitis without hematuria: Secondary | ICD-10-CM

## 2017-02-11 DIAGNOSIS — N3010 Interstitial cystitis (chronic) without hematuria: Secondary | ICD-10-CM | POA: Diagnosis not present

## 2017-02-11 DIAGNOSIS — D649 Anemia, unspecified: Secondary | ICD-10-CM | POA: Diagnosis not present

## 2017-02-11 DIAGNOSIS — E119 Type 2 diabetes mellitus without complications: Secondary | ICD-10-CM | POA: Diagnosis not present

## 2017-02-11 DIAGNOSIS — Z79899 Other long term (current) drug therapy: Secondary | ICD-10-CM

## 2017-02-11 LAB — COMPREHENSIVE METABOLIC PANEL
ALT: 16 U/L (ref 0–35)
AST: 16 U/L (ref 0–37)
Albumin: 4.1 g/dL (ref 3.5–5.2)
Alkaline Phosphatase: 47 U/L (ref 39–117)
BILIRUBIN TOTAL: 0.4 mg/dL (ref 0.2–1.2)
BUN: 17 mg/dL (ref 6–23)
CALCIUM: 8.5 mg/dL (ref 8.4–10.5)
CHLORIDE: 106 meq/L (ref 96–112)
CO2: 26 meq/L (ref 19–32)
Creatinine, Ser: 0.91 mg/dL (ref 0.40–1.20)
GFR: 66.66 mL/min (ref >60.00)
Glucose, Bld: 101 mg/dL — ABNORMAL HIGH (ref 70–99)
Potassium: 4.5 mEq/L (ref 3.5–5.1)
Sodium: 140 mEq/L (ref 135–145)
Total Protein: 6.3 g/dL (ref 6.0–8.3)

## 2017-02-11 LAB — CBC WITH DIFFERENTIAL/PLATELET
BASOS ABS: 0 10*3/uL (ref 0.0–0.1)
Basophils Relative: 0.6 % (ref 0.0–3.0)
EOS ABS: 0.1 10*3/uL (ref 0.0–0.7)
Eosinophils Relative: 1.9 % (ref 0.0–5.0)
HEMATOCRIT: 34.8 % — AB (ref 36.0–46.0)
HEMOGLOBIN: 11.3 g/dL — AB (ref 12.0–15.0)
Lymphocytes Relative: 43.6 % (ref 12.0–46.0)
Lymphs Abs: 3.3 10*3/uL (ref 0.7–4.0)
MCHC: 32.6 g/dL (ref 30.0–36.0)
MCV: 83.6 fl (ref 78.0–100.0)
MONOS PCT: 10.4 % (ref 3.0–12.0)
Monocytes Absolute: 0.8 10*3/uL (ref 0.1–1.0)
Neutro Abs: 3.3 10*3/uL (ref 1.4–7.7)
Neutrophils Relative %: 43.5 % (ref 43.0–77.0)
Platelets: 256 10*3/uL (ref 150.0–400.0)
RBC: 4.17 Mil/uL (ref 3.87–5.11)
RDW: 16.5 % — ABNORMAL HIGH (ref 11.5–15.5)
WBC: 7.6 10*3/uL (ref 4.0–10.5)

## 2017-02-11 LAB — LIPID PANEL
CHOL/HDL RATIO: 3
Cholesterol: 150 mg/dL (ref 0–200)
HDL: 52.7 mg/dL (ref >39.00)
LDL CALC: 75 mg/dL (ref 0–99)
NonHDL: 96.87
TRIGLYCERIDES: 110 mg/dL (ref 0.0–149.0)
VLDL: 22 mg/dL (ref 0.0–40.0)

## 2017-02-11 LAB — URINE CULTURE: ORGANISM ID, BACTERIA: NO GROWTH

## 2017-02-11 MED ORDER — DIAZEPAM 5 MG PO TABS: 5.0000 mg | ORAL_TABLET | Freq: Two times a day (BID) | ORAL | 5 refills | Status: DC | PRN

## 2017-02-11 NOTE — Progress Notes (Signed)
OFFICE VISIT  02/11/2017   CC:  Chief Complaint  Patient presents with  . Follow-up    RCI     HPI:    Patient is a 61 y.o.  female who presents for 6 mo f/u HLD, high risk med use, anemia of chronic dz. She has DM 2 managed by endocrinologist, Dr. Elvera Lennox. Has seropositive RA, is on chronic low dose prednisone therapy + methotrexate+plaquenil+orencia--followed by rheum.  Has recently been having more symptoms related to UTI and interstitial cystitis: recent urine clx +, sensitive to cipro, which I treated her with, but pt still with symptoms.  Repeat urine clx pending at this time.  HLD: tolerating statin w/out problem. Diet is fairly low in fat/cholest. Anemia of chronic dz: she is not currently taking any iron supplement.  ROS: no fevers, no cough, no HA's, no CP, no SOB, no abd pain, no melena or hematochezia, no myalgias or arthralgias.  Past Medical History:  Diagnosis Date  . Abnormal mammogram 02/09/2016   Left breast distortion-?mass?-bilat breast calcifications: bx to be done as of 02/09/16  . Acute medial meniscus tear of right knee   . Anemia of chronic disease   . Anxiety   . Diabetes mellitus 1995   Managed by Dr. Elvera Lennox (Endo)  . GERD (gastroesophageal reflux disease) per pt watches diet and take tums as needed  . H/O hiatal hernia   . Hyperlipidemia   . Hypertension   . Interstitial cystitis   . Lichen sclerosus of female genitalia   . Migraine   . Osteoarthritis   . Psoriasis   . Seasonal allergies   . Seropositive rheumatoid arthritis (HCC)    Hands, knees, jaw, elbow: responding to Simponi as of 09/29/15 rheum f/u.  Waning effectiveness on 03/2016 f/u so pt switched to Orencia injections.  Chronic low-dose prednisone therapy+ methotrexate.    Past Surgical History:  Procedure Laterality Date  . ABDOMINAL HYSTERECTOMY  1991   for fibroids; ovaries still in  . BREAST BIOPSY  2001; 02/12/16   fibrocystic breast disease in 2001 and again in 02/2016  (+scar from prev bx site w/calcifications).  . COLONOSCOPY  09/12/2013   Recall 5 yrs ([facility], Dr. Evette Cristal) due to FH of colon polyps.  Manfred Shirts   for chronic UTI  . DEXA  10/2016   Bone density normal (T-score 0.0)  . ESOPHAGOGASTRODUODENOSCOPY  04/19/2011   Nl except antral gastritis: bx = mild chron gastritis (H pyloria neg)   . intraarticular steroid injection  2013   3 R knee & L X 1; Dr Netta Corrigan  . KNEE ARTHROCENTESIS  2013   R knee x 3 & L X 1  . KNEE ARTHROSCOPY  10/21/2011   Procedure: ARTHROSCOPY KNEE;  Surgeon: Jacki Cones, MD;  Location: [facility];  Service: Orthopedics;  Laterality: Right;  WITH MEDIAL and lateral shaving of femoral chondyl  with microfracture technique of lateral and medial femoral chondyl suprapatellar synovectomy  . TOTAL KNEE ARTHROPLASTY  04/12/2012   Procedure: TOTAL KNEE ARTHROPLASTY;  Surgeon: Jacki Cones, MD;  Location: [facility];  Service: Orthopedics;  Laterality: Right;  Right Total Knee Arthroplasty  . TUBAL LIGATION  1981    Outpatient Medications Prior to Visit  Medication Sig Dispense Refill  . Abatacept (ORENCIA) 50 MG/0.4ML SOSY Inject 1 Dose into the skin once a week.    . cetirizine (ZYRTEC) 10 MG tablet Take 10 mg by mouth every evening.     Marland Kitchen  FOLIC ACID PO Take 1 mg by mouth daily.     . hydroxychloroquine (PLAQUENIL) 200 MG tablet Take 200 mg by mouth 2 (two) times daily. Added by rheum 12/23/15    . Insulin Detemir (LEVEMIR) 100 UNIT/ML Pen Inject 14 Units into the skin daily at 10 pm. 15 mL 2  . lisinopril (PRINIVIL,ZESTRIL) 20 MG tablet Take 0.5 tablets (10 mg total) by mouth daily. 45 tablet 3  . metFORMIN (GLUCOPHAGE-XR) 500 MG 24 hr tablet Take 2 tablets (1,000 mg total) by mouth 2 (two) times daily with a meal. 360 tablet 3  . Methotrexate Sodium (METHOTREXATE PO) Inject 0.8 mg as directed every Wednesday.     . Omega-3 Fatty Acids (FISH OIL) 1000 MG CAPS Take by mouth 2 (two) times daily.    .  predniSONE (DELTASONE) 5 MG tablet Take 10 mg by mouth daily. Take 7.5 mg by mouth daily with breakfast x 1 month then 5mg     . primidone (MYSOLINE) 50 MG tablet Take 50 mg by mouth. 3 by mouth at bedtime    . ranitidine (ZANTAC) 75 MG tablet Take 75 mg by mouth 2 (two) times daily.    . rosuvastatin (CRESTOR) 10 MG tablet Take 1 tablet (10 mg total) by mouth daily. 90 tablet 3  . sertraline (ZOLOFT) 50 MG tablet Take 1.5 tablets (75 mg total) by mouth daily with breakfast. 135 tablet 3  . sitaGLIPtin (JANUVIA) 100 MG tablet Take 1 tablet (100 mg total) by mouth daily. 90 tablet 3  . triamcinolone (KENALOG) 0.025 % ointment Apply 1 application topically as needed.    . vitamin E (VITAMIN E) 400 UNIT capsule Take 400 Units by mouth daily.    . diazepam (VALIUM) 5 MG tablet Take 1 tablet (5 mg total) by mouth every 12 (twelve) hours as needed. Migraine headaches 30 tablet 0   No facility-administered medications prior to visit.     Allergies  Allergen Reactions  . Penicillins Rash  . Pravastatin     Myalgias  . Simvastatin     Myalgias  . Hydrocodone-Acetaminophen Nausea And Vomiting    ROS As per HPI  PE: Blood pressure 128/83, pulse 71, temperature 98.4 F (36.9 C), temperature source Oral, resp. rate 16, weight 207 lb (93.9 kg), SpO2 97 %. Gen: Alert, well appearing.  Patient is oriented to person, place, time, and situation. AFFECT: pleasant, lucid thought and speech. CV: RRR, no m/r/g.   LUNGS: CTA bilat, nonlabored resps, good aeration in all lung fields. EXT: no clubbing, cyanosis, or edema.   LABS:  Lab Results  Component Value Date   TSH 1.03 07/23/2016   Lab Results  Component Value Date   WBC 7.6 02/11/2017   HGB 11.3 (L) 02/11/2017   HCT 34.8 (L) 02/11/2017   MCV 83.6 02/11/2017   PLT 256.0 02/11/2017   Lab Results  Component Value Date   CREATININE 0.91 02/11/2017   BUN 17 02/11/2017   NA 140 02/11/2017   K 4.5 02/11/2017   CL 106 02/11/2017   CO2 26  02/11/2017   Lab Results  Component Value Date   ALT 16 02/11/2017   AST 16 02/11/2017   ALKPHOS 47 02/11/2017   BILITOT 0.4 02/11/2017   Lab Results  Component Value Date   CHOL 150 02/11/2017   Lab Results  Component Value Date   HDL 52.70 02/11/2017   Lab Results  Component Value Date   LDLCALC 75 02/11/2017   Lab Results  Component Value  Date   TRIG 110.0 02/11/2017   Lab Results  Component Value Date   CHOLHDL 3 02/11/2017   Lab Results  Component Value Date   HGBA1C 6.0 12/17/2016    IMPRESSION AND PLAN:  1) Hyperlipidemia: tolerating statin. Check FLP today.  2) High risk medication use (for seropositive RA): CBC and CMET today.  3) Anemia of chronic dz: this has been stable. Monitor CBC today.  4) Recurrent lower urinary tract symptoms: sometimes infection is playing a role, but she has been dx'd with interstitial cystitis by a urologist in the remote past.  I recommended she see a urologist again. She will let me know about urology referral in future for interstitial cystitis.   Will f/u current urine clx.  An After Visit Summary was printed and given to the patient.  FOLLOW UP: Return in about 6 months (around 08/14/2017) for annual CPE (fasting).  Signed:  Santiago Bumpers, MD           02/11/2017

## 2017-02-14 ENCOUNTER — Ambulatory Visit: Payer: PRIVATE HEALTH INSURANCE | Admitting: Family Medicine

## 2017-03-17 ENCOUNTER — Other Ambulatory Visit: Payer: Self-pay | Admitting: Internal Medicine

## 2017-04-18 ENCOUNTER — Ambulatory Visit: Payer: PRIVATE HEALTH INSURANCE | Admitting: Internal Medicine

## 2017-04-19 ENCOUNTER — Ambulatory Visit (INDEPENDENT_AMBULATORY_CARE_PROVIDER_SITE_OTHER): Payer: PRIVATE HEALTH INSURANCE | Admitting: Internal Medicine

## 2017-04-19 ENCOUNTER — Encounter: Payer: Self-pay | Admitting: Internal Medicine

## 2017-04-19 ENCOUNTER — Telehealth: Payer: Self-pay | Admitting: Internal Medicine

## 2017-04-19 VITALS — BP 132/80 | HR 76 | Wt 214.0 lb

## 2017-04-19 DIAGNOSIS — Z23 Encounter for immunization: Secondary | ICD-10-CM

## 2017-04-19 DIAGNOSIS — E119 Type 2 diabetes mellitus without complications: Secondary | ICD-10-CM

## 2017-04-19 DIAGNOSIS — E785 Hyperlipidemia, unspecified: Secondary | ICD-10-CM

## 2017-04-19 DIAGNOSIS — Z794 Long term (current) use of insulin: Secondary | ICD-10-CM

## 2017-04-19 LAB — POCT GLYCOSYLATED HEMOGLOBIN (HGB A1C): HEMOGLOBIN A1C: 6.1

## 2017-04-19 MED ORDER — FREESTYLE LIBRE SENSOR SYSTEM MISC: 1.0000 | 11 refills | Status: DC

## 2017-04-19 MED ORDER — FREESTYLE LIBRE READER DEVI: 1.0000 | Freq: Three times a day (TID) | 1 refills | Status: DC

## 2017-04-19 NOTE — Addendum Note (Signed)
Addended by: Darene Lamer T on: 04/19/2017 11:29 AM   Modules accepted: Orders

## 2017-04-19 NOTE — Telephone Encounter (Signed)
Freestyle was sent already by [place] to the [facility].  Patient should be able to pick it up.

## 2017-04-19 NOTE — Telephone Encounter (Signed)
[facility] called and stated that they do not do free style Libre. Please advise  [phone]

## 2017-04-19 NOTE — Patient Instructions (Addendum)
Please continue: - Levemir 14 units at bedtime. - Januvia 100 mg daily in am   Try to split: - Metformin ER into 500 mg 2-3x a day with meals  Please come back for a follow-up appointment in 4 months.

## 2017-04-19 NOTE — Progress Notes (Signed)
Patient ID: Raven [name], female   DOB: [birth year], 61 y.o.   MRN: [id number]  HPI: Raven [name] is a 61 y.o.-year-old female, returning for f/u for DM2, dx ~1995, insulin-dependent since 11/2013, controlled, without complications. Last visit 4 mo ago..  She is on Prednisone for RA >> now on 5 mg daily for last month, but had another 2 weeks taper from 40 mg.  Last hemoglobin A1c was: Lab Results  Component Value Date   HGBA1C 6.0 12/17/2016   HGBA1C 6.5 07/26/2016   HGBA1C 5.9 04/20/2016   She has RA >> was on Simponi (changed from Remicade) and MTX >> started Orencia and stays on MTX and Plaquenil.  Pt is on a regimen of: - Levemir 14 units qhs - Metformin 1000 mg po bid >> Metformin ER 1000 mg 2x a day - still having some diarrhea - Januvia 100 mg daily in am >> started 04/2014  She tried Glimepiride in the past >> fluctuating blood sugar.  Pt checks her sugars 1x a day: - am: 130-140 >> 97-120 >> 90s-110 >> 90s-110 >> 120s - 2h after b'fast: 121-160, 180 >> 160s >> 190s >> 160-180 - before lunch: 103 >> 100-110 >> 100-110 >> n/c - 2h after lunch:  91-165, 180 >> 140s >> n/c - before dinner:83-86 >> n/c >> 100-110 >> n/c - 2h after dinner: 87-171, 226 >> up to 190 >> 140-190s >> 160-180 - bedtime:97-159, 210 (missed meds) >> n/c >> 110-120 >> n/c - nighttime: n/c Lowest sugar was 90s in am >> ? (in church);  she has hypoglycemia awareness at 1s.  Highest sugar was 220 >> 180.  Meter: ReliOn.  Pt's meals are: - Breakfast: scrambled eggs + bacon + tomatoes - Lunch: meat + 2 veggies + no bread unless burger - Dinner: meat + 2 veggies - Snacks: 2: nuts; cheese;   - No CKD, last BUN/creatinine:  Lab Results  Component Value Date   BUN 17 02/11/2017   CREATININE 0.91 02/11/2017  On Lisinopril. - last set of lipids: Lab Results  Component Value Date   CHOL 150 02/11/2017   HDL 52.70 02/11/2017   LDLCALC 75 02/11/2017   LDLDIRECT 142.3 03/06/2008   TRIG  110.0 02/11/2017   CHOLHDL 3 02/11/2017  She was on statins but taken off b/c transaminitis in 10/2013. She was on Atorvastatin >> mm cramps. Now on Crestor 10 mg >> no mm aches. - last eye exam - 04/2017. No DR. She has cataract. - she denies numbness and tingling in her feet.  She will go on a [employer] at the end of the week.  ROS: Constitutional: no weight gain/no weight loss, + fatigue, no subjective hyperthermia, no subjective hypothermia Eyes: no blurry vision, no xerophthalmia ENT: no sore throat, no nodules palpated in throat, no dysphagia, no odynophagia, no hoarseness Cardiovascular: no CP/no SOB/no palpitations/no leg swelling Respiratory: no cough/no SOB/no wheezing Gastrointestinal: no N/no V/no D/no C/no acid reflux Musculoskeletal: no muscle aches/+ joint aches Skin: no rashes, no hair loss Neurological: no tremors/no numbness/no tingling/no dizziness  I reviewed pt's medications, allergies, PMH, social hx, family hx, and changes were documented in the history of present illness. Otherwise, unchanged from my initial visit note.  PE: BP 132/80 (BP Location: Left Arm, Patient Position: Sitting)   Pulse 76   Wt 214 lb (97.1 kg)   SpO2 98%   BMI 32.54 kg/m  Body mass index is 32.54 kg/m.  Wt Readings from Last 3 Encounters:  04/19/17 214 lb (97.1 kg)  02/11/17 207 lb (93.9 kg)  02/04/17 209 lb 4 oz (94.9 kg)   Constitutional: overweight, in NAD Eyes: PERRLA, EOMI, no exophthalmos ENT: moist mucous membranes, no thyromegaly, no cervical lymphadenopathy Cardiovascular: RRR, No MRG Respiratory: CTA B Gastrointestinal: abdomen soft, NT, ND, BS+ Musculoskeletal: no deformities, strength intact in all 4 Skin: moist, warm, no rashes Neurological: no tremor with outstretched hands, DTR normal in all 4  ASSESSMENT: 1. DM2, insulin-dependent, controlled, without long term complications  2. HL  PLAN:  1. Patient with long-standing, Previously uncontrolled  diabetes, now with better control on the low-dose basal insulin and also metformin and Januvia. She has had a prednisone taper since last visit, however, the sugars did not change much.  - At last visit, she was describing that she still had diarrhea from metformin so I suggested to switch to the extended-release formulation. The diarrhea has improved, but is not resolved. I suggested to decrease the dose to 500 mg with every meal, or 500 mg twice a day - I suggested to:  Patient Instructions  Please continue: - Levemir 14 units at bedtime. - Januvia 100 mg daily in am   Try to split: - Metformin ER into 500 mg 2-3x a day with meals  Please come back for a follow-up appointment in 4 months.  - today, HbA1c is 6.1% (great!) - continue checking sugars at different times of the day - check 1x a day, rotating checks - She is interested in the FreeStyle libre CGM - Discussed how it works and send the prescription to her pharmacy  - advised for yearly eye exams >> she is UTD - Return to clinic in 4 mo with sugar log    2. HL - Reviewed her latest cholesterol levels and her LDL cholesterol is significantly improved, on Crestor. No side effects from Crestor.  - Continue the statin.  Carlus Pavlov, MD PhD [facility] Endocrinology

## 2017-05-03 ENCOUNTER — Other Ambulatory Visit: Payer: Self-pay | Admitting: Internal Medicine

## 2017-07-12 HISTORY — PX: GALLBLADDER SURGERY: SHX652

## 2017-07-13 ENCOUNTER — Ambulatory Visit: Payer: PRIVATE HEALTH INSURANCE | Admitting: Family Medicine

## 2017-07-13 ENCOUNTER — Encounter: Payer: Self-pay | Admitting: Family Medicine

## 2017-07-13 VITALS — BP 146/77 | HR 81 | Temp 98.1°F | Resp 16 | Wt 214.0 lb

## 2017-07-13 DIAGNOSIS — J029 Acute pharyngitis, unspecified: Secondary | ICD-10-CM | POA: Diagnosis not present

## 2017-07-13 DIAGNOSIS — J069 Acute upper respiratory infection, unspecified: Secondary | ICD-10-CM

## 2017-07-13 DIAGNOSIS — B9789 Other viral agents as the cause of diseases classified elsewhere: Secondary | ICD-10-CM

## 2017-07-13 LAB — POCT RAPID STREP A (OFFICE): RAPID STREP A SCREEN: NEGATIVE

## 2017-07-13 MED ORDER — BENZONATATE 200 MG PO CAPS: 200.0000 mg | ORAL_CAPSULE | Freq: Three times a day (TID) | ORAL | 1 refills | Status: DC | PRN

## 2017-07-13 MED ORDER — HYDROCODONE-HOMATROPINE 5-1.5 MG/5ML PO SYRP: ORAL_SOLUTION | ORAL | 0 refills | Status: DC

## 2017-07-13 NOTE — Progress Notes (Signed)
OFFICE VISIT  07/13/2017   CC:  Chief Complaint  Patient presents with  . Sore Throat    hoarse   HPI:    Patient is a 62 y.o.  female who presents for sore throat. Onset of cough 2 days ago, inhibited sleep.  Woke up with sore throat and it felt swollen.  It has improved as the day has gone on.  Lost voice last 24h but this is come back some.  No nasal of facial congestion.  Feels some PND. Heard some wheezing.  No SOB.  No fever.  No body aches, no fatigue.  Past Medical History:  Diagnosis Date  . Abnormal mammogram 02/09/2016   Left breast distortion-?mass?-bilat breast calcifications: bx to be done as of 02/09/16  . Acute medial meniscus tear of right knee   . Anemia of chronic disease   . Anxiety   . Diabetes mellitus 1995   Managed by Dr. Elvera Lennox (Endo)  . GERD (gastroesophageal reflux disease) per pt watches diet and take tums as needed  . H/O hiatal hernia   . Hyperlipidemia   . Hypertension   . Interstitial cystitis   . Lichen sclerosus of female genitalia   . Migraine   . Osteoarthritis   . Psoriasis   . Seasonal allergies   . Seropositive rheumatoid arthritis (HCC)    Hands, knees, jaw, elbow: responding to Simponi as of 09/29/15 rheum f/u.  Waning effectiveness on 03/2016 f/u so pt switched to Orencia injections.  Chronic low-dose prednisone therapy+ methotrexate.    Past Surgical History:  Procedure Laterality Date  . ABDOMINAL HYSTERECTOMY  1991   for fibroids; ovaries still in  . BREAST BIOPSY  2001; 02/12/16   fibrocystic breast disease in 2001 and again in 02/2016 (+scar from prev bx site w/calcifications).  . COLONOSCOPY  09/12/2013   Recall 5 yrs ([facility], Dr. Evette Cristal) due to FH of colon polyps.  Manfred Shirts   for chronic UTI  . DEXA  10/2016   Bone density normal (T-score 0.0)  . ESOPHAGOGASTRODUODENOSCOPY  04/19/2011   Nl except antral gastritis: bx = mild chron gastritis (H pyloria neg)   . intraarticular steroid injection  2013   3 R knee &  L X 1; Dr Netta Corrigan  . KNEE ARTHROCENTESIS  2013   R knee x 3 & L X 1  . KNEE ARTHROSCOPY  10/21/2011   Procedure: ARTHROSCOPY KNEE;  Surgeon: Jacki Cones, MD;  Location: [facility];  Service: Orthopedics;  Laterality: Right;  WITH MEDIAL and lateral shaving of femoral chondyl  with microfracture technique of lateral and medial femoral chondyl suprapatellar synovectomy  . TOTAL KNEE ARTHROPLASTY  04/12/2012   Procedure: TOTAL KNEE ARTHROPLASTY;  Surgeon: Jacki Cones, MD;  Location: [facility];  Service: Orthopedics;  Laterality: Right;  Right Total Knee Arthroplasty  . TUBAL LIGATION  1981    Outpatient Medications Prior to Visit  Medication Sig Dispense Refill  . Abatacept (ORENCIA) 50 MG/0.4ML SOSY Inject 1 Dose into the skin once a week.    . cetirizine (ZYRTEC) 10 MG tablet Take 10 mg by mouth every evening.     . Continuous Blood Gluc Receiver (FREESTYLE LIBRE READER) DEVI 1 Device by Does not apply route 3 (three) times daily. 1 Device 1  . Continuous Blood Gluc Sensor (FREESTYLE LIBRE SENSOR SYSTEM) MISC 1 Device by Does not apply route every 30 (thirty) days. 3 each 11  . diazepam (VALIUM) 5 MG tablet Take  1 tablet (5 mg total) by mouth every 12 (twelve) hours as needed. Migraine headaches 60 tablet 5  . FOLIC ACID PO Take 1 mg by mouth daily.     . hydroxychloroquine (PLAQUENIL) 200 MG tablet Take 200 mg by mouth 2 (two) times daily. Added by rheum 12/23/15    . JANUVIA 100 MG tablet TAKE ONE TABLET BY MOUTH EVERY DAY 90 tablet 1  . LEVEMIR FLEXTOUCH 100 UNIT/ML Pen INJECT 14 UNITS INTO THE SKIN DAILY AT 10PM 15 mL 2  . lisinopril (PRINIVIL,ZESTRIL) 20 MG tablet Take 0.5 tablets (10 mg total) by mouth daily. 45 tablet 3  . metFORMIN (GLUCOPHAGE-XR) 500 MG 24 hr tablet Take 2 tablets (1,000 mg total) by mouth 2 (two) times daily with a meal. 360 tablet 3  . Methotrexate Sodium (METHOTREXATE PO) Inject 1 mg as directed every Wednesday.     . Omega-3 Fatty Acids (FISH  OIL) 1000 MG CAPS Take by mouth 2 (two) times daily.    . primidone (MYSOLINE) 50 MG tablet Take 50 mg by mouth. 3 by mouth at bedtime    . ranitidine (ZANTAC) 75 MG tablet Take 75 mg by mouth 2 (two) times daily.    . rosuvastatin (CRESTOR) 10 MG tablet Take 1 tablet (10 mg total) by mouth daily. 90 tablet 3  . sertraline (ZOLOFT) 50 MG tablet Take 1.5 tablets (75 mg total) by mouth daily with breakfast. 135 tablet 3  . triamcinolone (KENALOG) 0.025 % ointment Apply 1 application topically as needed.    . vitamin E (VITAMIN E) 400 UNIT capsule Take 400 Units by mouth daily.     No facility-administered medications prior to visit.     Allergies  Allergen Reactions  . Penicillins Rash  . Pravastatin     Myalgias  . Simvastatin     Myalgias  . Hydrocodone-Acetaminophen Nausea And Vomiting    ROS As per HPI  PE: Blood pressure (!) 146/77, pulse 81, temperature 98.1 F (36.7 C), temperature source Oral, resp. rate 16, weight 214 lb (97.1 kg), SpO2 98 %. VS: noted--normal. Gen: alert, NAD, NONTOXIC APPEARING. HEENT: eyes without injection, drainage, or swelling.  Ears: EACs clear, TMs with normal light reflex and landmarks.  Nose: Clear rhinorrhea, with some dried, crusty exudate adherent to mildly injected mucosa.  No purulent d/c.  No paranasal sinus TTP.  No facial swelling.  Throat and mouth without focal lesion.  No pharyngial swelling, erythema, or exudate.   Neck: supple, no LAD.   LUNGS: CTA bilat, nonlabored resps.   CV: RRR, no m/r/g. EXT: no c/c/e SKIN: no rash   LABS:  Rapid strep NEG    Chemistry      Component Value Date/Time   NA 140 02/11/2017 1055   K 4.5 02/11/2017 1055   CL 106 02/11/2017 1055   CO2 26 02/11/2017 1055   BUN 17 02/11/2017 1055   CREATININE 0.91 02/11/2017 1055      Component Value Date/Time   CALCIUM 8.5 02/11/2017 1055   ALKPHOS 47 02/11/2017 1055   AST 16 02/11/2017 1055   ALT 16 02/11/2017 1055   BILITOT 0.4 02/11/2017 1055        IMPRESSION AND PLAN:  Viral URI with cough, sore throat. Rapid strep NEG. Tessalon perles 200 mg tid prn, #30, RF x 1.  An After Visit Summary was printed and given to the patient.  FOLLOW UP: Return if symptoms worsen or fail to improve.  Signed:  Santiago Bumpers, MD  07/13/2017     

## 2017-08-17 ENCOUNTER — Other Ambulatory Visit: Payer: Self-pay

## 2017-08-17 ENCOUNTER — Encounter: Payer: Self-pay | Admitting: Family Medicine

## 2017-08-17 ENCOUNTER — Ambulatory Visit (INDEPENDENT_AMBULATORY_CARE_PROVIDER_SITE_OTHER): Payer: PRIVATE HEALTH INSURANCE | Admitting: Family Medicine

## 2017-08-17 VITALS — BP 137/84 | HR 77 | Temp 98.4°F | Resp 16 | Ht 68.5 in | Wt 208.0 lb

## 2017-08-17 DIAGNOSIS — D649 Anemia, unspecified: Secondary | ICD-10-CM | POA: Diagnosis not present

## 2017-08-17 DIAGNOSIS — I10 Essential (primary) hypertension: Secondary | ICD-10-CM

## 2017-08-17 DIAGNOSIS — E119 Type 2 diabetes mellitus without complications: Secondary | ICD-10-CM

## 2017-08-17 DIAGNOSIS — E7800 Pure hypercholesterolemia, unspecified: Secondary | ICD-10-CM | POA: Diagnosis not present

## 2017-08-17 DIAGNOSIS — Z0000 Encounter for general adult medical examination without abnormal findings: Secondary | ICD-10-CM

## 2017-08-17 DIAGNOSIS — E785 Hyperlipidemia, unspecified: Secondary | ICD-10-CM

## 2017-08-17 DIAGNOSIS — F411 Generalized anxiety disorder: Secondary | ICD-10-CM

## 2017-08-17 LAB — CBC WITH DIFFERENTIAL/PLATELET
BASOS ABS: 0.1 10*3/uL (ref 0.0–0.1)
Basophils Relative: 1.1 % (ref 0.0–3.0)
Eosinophils Absolute: 0.1 10*3/uL (ref 0.0–0.7)
Eosinophils Relative: 1.3 % (ref 0.0–5.0)
HCT: 36.3 % (ref 36.0–46.0)
Hemoglobin: 11.7 g/dL — ABNORMAL LOW (ref 12.0–15.0)
LYMPHS ABS: 3.2 10*3/uL (ref 0.7–4.0)
Lymphocytes Relative: 37.1 % (ref 12.0–46.0)
MCHC: 32.2 g/dL (ref 30.0–36.0)
MCV: 82.7 fl (ref 78.0–100.0)
Monocytes Absolute: 1 10*3/uL (ref 0.1–1.0)
Monocytes Relative: 11.1 % (ref 3.0–12.0)
NEUTROS ABS: 4.3 10*3/uL (ref 1.4–7.7)
NEUTROS PCT: 49.4 % (ref 43.0–77.0)
PLATELETS: 271 10*3/uL (ref 150.0–400.0)
RBC: 4.39 Mil/uL (ref 3.87–5.11)
RDW: 17.7 % — AB (ref 11.5–15.5)
WBC: 8.7 10*3/uL (ref 4.0–10.5)

## 2017-08-17 LAB — LIPID PANEL
CHOL/HDL RATIO: 4
Cholesterol: 158 mg/dL (ref 0–200)
HDL: 40.8 mg/dL (ref >39.00)
LDL Cholesterol: 95 mg/dL (ref 0–99)
NONHDL: 116.99
TRIGLYCERIDES: 110 mg/dL (ref 0.0–149.0)
VLDL: 22 mg/dL (ref 0.0–40.0)

## 2017-08-17 LAB — BASIC METABOLIC PANEL
BUN: 17 mg/dL (ref 6–23)
CO2: 25 mEq/L (ref 19–32)
Calcium: 8.9 mg/dL (ref 8.4–10.5)
Chloride: 104 mEq/L (ref 96–112)
Creatinine, Ser: 0.86 mg/dL (ref 0.40–1.20)
GFR: 71.03 mL/min (ref >60.00)
Glucose, Bld: 129 mg/dL — ABNORMAL HIGH (ref 70–99)
POTASSIUM: 4.3 meq/L (ref 3.5–5.1)
SODIUM: 137 meq/L (ref 135–145)

## 2017-08-17 LAB — TSH: TSH: 1.96 u[IU]/mL (ref 0.35–4.50)

## 2017-08-17 MED ORDER — LISINOPRIL 20 MG PO TABS: 10.0000 mg | ORAL_TABLET | Freq: Every day | ORAL | 3 refills | Status: DC

## 2017-08-17 MED ORDER — ROSUVASTATIN CALCIUM 10 MG PO TABS: 10.0000 mg | ORAL_TABLET | Freq: Every day | ORAL | 3 refills | Status: DC

## 2017-08-17 MED ORDER — SERTRALINE HCL 50 MG PO TABS
75.0000 mg | ORAL_TABLET | Freq: Every day | ORAL | 3 refills | Status: DC
Start: 2017-08-17 — End: 2017-10-14

## 2017-08-17 NOTE — Progress Notes (Signed)
Office Note 08/17/2017  CC:  Chief Complaint  Patient presents with  . Annual Exam    HPI:  Raven [name] is a 62 y.o. White female who is here for annual health maintenance exam. Has diabetes managed by endocrinologist, Dr. Elvera Lennox.  Going to [place] in March.  She asks about any vaccines needed. Going to [place] and [place].  Exercise: walking minimally. Diet: currently doing 30d of no dairy, bread, or pasta, and only fresh foods.  Past Medical History:  Diagnosis Date  . Abnormal mammogram 02/09/2016   Left breast distortion-?mass?-bilat breast calcifications: bx to be done as of 02/09/16  . Acute medial meniscus tear of right knee   . Anemia of chronic disease   . Anxiety   . Diabetes mellitus 1995   Managed by Dr. Elvera Lennox (Endo)  . GERD (gastroesophageal reflux disease) per pt watches diet and take tums as needed  . H/O hiatal hernia   . Hyperlipidemia   . Hypertension   . Interstitial cystitis   . Lichen sclerosus of female genitalia   . Migraine   . Osteoarthritis   . Psoriasis   . Seasonal allergies   . Seropositive rheumatoid arthritis (HCC)    Hands, knees, jaw, elbow: responding to Simponi as of 09/29/15 rheum f/u.  Waning effectiveness on 03/2016 f/u so pt switched to Orencia injections.  Chronic low-dose prednisone therapy+ methotrexate.    Past Surgical History:  Procedure Laterality Date  . ABDOMINAL HYSTERECTOMY  1991   for fibroids; ovaries still in  . BREAST BIOPSY  2001; 02/12/16   fibrocystic breast disease in 2001 and again in 02/2016 (+scar from prev bx site w/calcifications).  . COLONOSCOPY  09/12/2013   Recall 5 yrs ([facility], Dr. Evette Cristal) due to FH of colon polyps.  Manfred Shirts   for chronic UTI  . DEXA  10/2016   Bone density normal (T-score 0.0)  . ESOPHAGOGASTRODUODENOSCOPY  04/19/2011   Nl except antral gastritis: bx = mild chron gastritis (H pyloria neg)   . intraarticular steroid injection  2013   3 R knee & L X 1; Dr  Netta Corrigan  . KNEE ARTHROCENTESIS  2013   R knee x 3 & L X 1  . KNEE ARTHROSCOPY  10/21/2011   Procedure: ARTHROSCOPY KNEE;  Surgeon: Jacki Cones, MD;  Location: [facility];  Service: Orthopedics;  Laterality: Right;  WITH MEDIAL and lateral shaving of femoral chondyl  with microfracture technique of lateral and medial femoral chondyl suprapatellar synovectomy  . TOTAL KNEE ARTHROPLASTY  04/12/2012   Procedure: TOTAL KNEE ARTHROPLASTY;  Surgeon: Jacki Cones, MD;  Location: [facility];  Service: Orthopedics;  Laterality: Right;  Right Total Knee Arthroplasty  . TUBAL LIGATION  1981    Family History  Problem Relation Age of Onset  . Diabetes Mother   . Stroke Mother 64  . Osteoarthritis Mother   . Stroke Brother 16  . Heart disease Father        CABG  . Lung cancer Father   . Prostate cancer Father   . Pancreatic cancer Father   . Pancreatic cancer Paternal Grandfather   . Bipolar disorder Daughter   . Anxiety disorder Son   . Arthritis Maternal Grandmother        rheumatoid  . Heart attack Brother 97       smoker    Social History   Socioeconomic History  . Marital status: Married    Spouse name: Not on  file  . Number of children: Not on file  . Years of education: Not on file  . Highest education level: Not on file  Social Needs  . Financial resource strain: Not on file  . Food insecurity - worry: Not on file  . Food insecurity - inability: Not on file  . Transportation needs - medical: Not on file  . Transportation needs - non-medical: Not on file  Occupational History  . Not on file  Tobacco Use  . Smoking status: Never Smoker  . Smokeless tobacco: Never Used  Substance and Sexual Activity  . Alcohol use: No  . Drug use: No  . Sexual activity: Not on file  Other Topics Concern  . Not on file  Social History Narrative   Married, 3 children (2 in [place], 1 in [place]).  4 grandchildren.   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)    No tob/alc/drugs.   2 cups of coffee/day x 3 months   Regular exercise- no-due to arthritis.   Religion affecting care, "it allows stress management:"    Outpatient Medications Prior to Visit  Medication Sig Dispense Refill  . Abatacept (ORENCIA) 50 MG/0.4ML SOSY Inject 1 Dose into the skin once a week.    . cetirizine (ZYRTEC) 10 MG tablet Take 10 mg by mouth every evening.     . Continuous Blood Gluc Receiver (FREESTYLE LIBRE READER) DEVI 1 Device by Does not apply route 3 (three) times daily. 1 Device 1  . Continuous Blood Gluc Sensor (FREESTYLE LIBRE SENSOR SYSTEM) MISC 1 Device by Does not apply route every 30 (thirty) days. 3 each 11  . cyclobenzaprine (FLEXERIL) 5 MG tablet Take 5 mg by mouth 3 (three) times daily as needed for muscle spasms. Take 1 1/2 tablets 3 times daily    . diazepam (VALIUM) 5 MG tablet Take 1 tablet (5 mg total) by mouth every 12 (twelve) hours as needed. Migraine headaches 60 tablet 5  . FOLIC ACID PO Take 1 mg by mouth daily.     Marland Kitchen JANUVIA 100 MG tablet TAKE ONE TABLET BY MOUTH EVERY DAY 90 tablet 1  . LEVEMIR FLEXTOUCH 100 UNIT/ML Pen INJECT 14 UNITS INTO THE SKIN DAILY AT 10PM 15 mL 2  . lisinopril (PRINIVIL,ZESTRIL) 20 MG tablet Take 0.5 tablets (10 mg total) by mouth daily. 45 tablet 3  . metFORMIN (GLUCOPHAGE-XR) 500 MG 24 hr tablet Take 2 tablets (1,000 mg total) by mouth 2 (two) times daily with a meal. 360 tablet 3  . Methotrexate Sodium (METHOTREXATE PO) Inject 1 mg as directed every Wednesday.     . Omega-3 Fatty Acids (FISH OIL) 1000 MG CAPS Take by mouth 2 (two) times daily.    . primidone (MYSOLINE) 50 MG tablet Take 50 mg by mouth. 3 by mouth at bedtime    . ranitidine (ZANTAC) 75 MG tablet Take 75 mg by mouth 2 (two) times daily.    . rosuvastatin (CRESTOR) 10 MG tablet Take 1 tablet (10 mg total) by mouth daily. 90 tablet 3  . sertraline (ZOLOFT) 50 MG tablet Take 1.5 tablets (75 mg total) by mouth daily with breakfast. 135 tablet 3  . vitamin  E (VITAMIN E) 400 UNIT capsule Take 400 Units by mouth daily.    . benzonatate (TESSALON) 200 MG capsule Take 1 capsule (200 mg total) by mouth 3 (three) times daily as needed for cough. (Patient not taking: Reported on 08/17/2017) 30 capsule 1  . hydroxychloroquine (PLAQUENIL) 200 MG tablet Take 200  mg by mouth 2 (two) times daily. Added by rheum 12/23/15    . triamcinolone (KENALOG) 0.025 % ointment Apply 1 application topically as needed.     No facility-administered medications prior to visit.     Allergies  Allergen Reactions  . Penicillins Rash  . Pravastatin     Myalgias  . Simvastatin     Myalgias  . Hydrocodone-Acetaminophen Nausea And Vomiting    ROS Review of Systems  Constitutional: Negative for appetite change, chills, fatigue and fever.  HENT: Negative for congestion, dental problem, ear pain and sore throat.   Eyes: Negative for discharge, redness and visual disturbance.  Respiratory: Negative for cough, chest tightness, shortness of breath and wheezing.   Cardiovascular: Negative for chest pain, palpitations and leg swelling.  Gastrointestinal: Negative for abdominal pain, blood in stool, diarrhea, nausea and vomiting.  Genitourinary: Negative for difficulty urinating, dysuria, flank pain, frequency, hematuria and urgency.  Musculoskeletal: Negative for arthralgias, back pain, joint swelling, myalgias and neck stiffness.  Skin: Negative for pallor and rash.  Neurological: Negative for dizziness, speech difficulty, weakness and headaches.  Hematological: Negative for adenopathy. Does not bruise/bleed easily.  Psychiatric/Behavioral: Negative for confusion and sleep disturbance. The patient is not nervous/anxious.     PE; Blood pressure 137/84, pulse 77, temperature 98.4 F (36.9 C), temperature source Oral, resp. rate 16, height 5' 8.5" (1.74 m), weight 208 lb (94.3 kg), SpO2 96 %. Body mass index is 31.17 kg/m. Exam chaperoned by Vanetta Mulders, CMA.  Gen: Alert, well  appearing.  Patient is oriented to person, place, time, and situation. AFFECT: pleasant, lucid thought and speech. ENT: Ears: EACs clear, normal epithelium.  TMs with good light reflex and landmarks bilaterally.  Eyes: no injection, icteris, swelling, or exudate.  EOMI, PERRLA. Nose: no drainage or turbinate edema/swelling.  No injection or focal lesion.  Mouth: lips without lesion/swelling.  Oral mucosa pink and moist.  Dentition intact and without obvious caries or gingival swelling.  Oropharynx without erythema, exudate, or swelling.  Neck: supple/nontender.  No LAD, mass, or TM.  Carotid pulses 2+ bilaterally, without bruits. CV: RRR, no m/r/g.   LUNGS: CTA bilat, nonlabored resps, good aeration in all lung fields. ABD: soft, NT, ND, BS normal.  No hepatospenomegaly or mass.  No bruits. EXT: no clubbing, cyanosis, or edema.  Musculoskeletal: no joint swelling, erythema, warmth, or tenderness.  ROM of all joints intact. Skin - no sores or suspicious lesions or rashes or color changes   Pertinent labs:  Lab Results  Component Value Date   TSH 1.03 07/23/2016   Lab Results  Component Value Date   WBC 7.6 02/11/2017   HGB 11.3 (L) 02/11/2017   HCT 34.8 (L) 02/11/2017   MCV 83.6 02/11/2017   PLT 256.0 02/11/2017   Lab Results  Component Value Date   CREATININE 0.91 02/11/2017   BUN 17 02/11/2017   NA 140 02/11/2017   K 4.5 02/11/2017   CL 106 02/11/2017   CO2 26 02/11/2017   Lab Results  Component Value Date   ALT 16 02/11/2017   AST 16 02/11/2017   ALKPHOS 47 02/11/2017   BILITOT 0.4 02/11/2017   Lab Results  Component Value Date   CHOL 150 02/11/2017   Lab Results  Component Value Date   HDL 52.70 02/11/2017   Lab Results  Component Value Date   LDLCALC 75 02/11/2017   Lab Results  Component Value Date   TRIG 110.0 02/11/2017   Lab Results  Component Value  Date   CHOLHDL 3 02/11/2017   Lab Results  Component Value Date   HGBA1C 6.1 04/19/2017    ASSESSMENT AND PLAN:   Health maintenance exam: Reviewed age and gender appropriate health maintenance issues (prudent diet, regular exercise, health risks of tobacco and excessive alcohol, use of seatbelts, fire alarms in home, use of sunscreen).  Also reviewed age and gender appropriate health screening as well as vaccine recommendations. Vaccines: Tdap, flu, and pneumovax UTD. Labs: BMET, FLP, CBC, TSH. Cervical ca screening: n/a---pt had TAH for benign reason. Breast ca screening: last mammogram 2017--she is overdue for repeat.  Pt got scheduling letter and needs to reschedule this. Colon ca screening: next colonoscopy due 2020.  Hep A vaccine recommended for travel to [place], but I want her to contact her rheum MD and make sure it is ok from their standpoint (immunocompromised due to rheum med).  An After Visit Summary was printed and given to the patient.  FOLLOW UP:  Return in about 6 months (around 02/14/2018) for routine chronic illness f/u.  Signed:  Santiago Bumpers, MD           08/17/2017

## 2017-08-17 NOTE — Patient Instructions (Signed)

## 2017-08-19 ENCOUNTER — Ambulatory Visit: Payer: PRIVATE HEALTH INSURANCE | Admitting: Internal Medicine

## 2017-08-19 ENCOUNTER — Encounter: Payer: Self-pay | Admitting: Internal Medicine

## 2017-08-19 VITALS — BP 116/78 | HR 80 | Ht 68.0 in | Wt 207.0 lb

## 2017-08-19 DIAGNOSIS — Z794 Long term (current) use of insulin: Secondary | ICD-10-CM | POA: Diagnosis not present

## 2017-08-19 DIAGNOSIS — E785 Hyperlipidemia, unspecified: Secondary | ICD-10-CM | POA: Diagnosis not present

## 2017-08-19 DIAGNOSIS — E119 Type 2 diabetes mellitus without complications: Secondary | ICD-10-CM

## 2017-08-19 LAB — POCT GLYCOSYLATED HEMOGLOBIN (HGB A1C): Hemoglobin A1C: 6.8

## 2017-08-19 MED ORDER — INSULIN DETEMIR 100 UNIT/ML FLEXPEN: PEN_INJECTOR | SUBCUTANEOUS | 11 refills | Status: DC

## 2017-08-19 MED ORDER — SITAGLIPTIN PHOSPHATE 100 MG PO TABS: 100.0000 mg | ORAL_TABLET | Freq: Every day | ORAL | 3 refills | Status: DC

## 2017-08-19 NOTE — Progress Notes (Signed)
Patient ID: Raven [name], female   DOB: 1955/11/03, 62 y.o.   MRN: [id number]  HPI: Raven [name] is a 62 y.o.-year-old female, returning for f/u for DM2, dx ~1995, insulin-dependent since 11/2013, controlled, without long-term complications. Last visit 4 months ago.  She is on prednisone for RA: Now on 5 mg daily. She also had a steroid inj 3 weeks ago.   She relaxed her diet over the [facility] >> sugars higher >> started Whole 30 diet 08/12/2017. Sugars improved. Also, already lost 8 lbs.  Last hemoglobin A1c was: Lab Results  Component Value Date   HGBA1C 6.1 04/19/2017   HGBA1C 6.0 12/17/2016   HGBA1C 6.5 07/26/2016   She has RA >> was on Simponi (changed from Remicade) and MTX >> started Orencia and stays on MTX. Off Plaquenil.  Pt is on a regimen of: - Levemir 14 units at bedtime. - Metformin ER 500 mg in am and 1000 mg at night - Januvia 100 mg in a.m. She tried Glimepiride in the past >> fluctuating blood sugar.  Pt checks her sugars many times a day with Freestyle Libre CGM: Freestyle Libre CGM parameters: - average: 117 +/-29.3 - coefficient of variation: 25% (19-25%) - time in range:  - low (<70): 0% - normal (70-180): 98% - high (>180): 2%  Sugars - 25-75%: - am: 97-120 >> 90s-110 >> 90s-110 >> 120s >> 90-110 - 2h after b'fast:160s >> 190s >> 160-180 >> 120-130 - before lunch:100-110 >> 100-110 >> n/c >> 110-120 - 2h after lunch:  91-165, 180 >> 140s >> n/c >> 140-154 - before dinner:83-86 >> n/c >> 100-110 >> n/c >> 130-150 - 2h after dinner:up to 190 >> 140-190s >> 160-180 >> n/c - bedtime:97-159, 210 >> n/c >> 110-120 >> n/c  - nighttime: n/c >> [phone] Lowest sugar was 90s in am >> 72;  she has hypoglycemia awareness in the 70s. Highest sugar was 180 >>174.  Meter: ReliOn.  Pt's meals are: - Breakfast: scrambled eggs + bacon + tomatoes - Lunch: meat + 2 veggies + no bread unless burger - Dinner: meat + 2 veggies - Snacks: 2: nuts; cheese;    -No CKD, last BUN/creatinine:  Lab Results  Component Value Date   BUN 17 08/17/2017   CREATININE 0.86 08/17/2017  On lisinopril. -+ HL; last set of lipids: Lab Results  Component Value Date   CHOL 158 08/17/2017   HDL 40.80 08/17/2017   LDLCALC 95 08/17/2017   LDLDIRECT 142.3 03/06/2008   TRIG 110.0 08/17/2017   CHOLHDL 4 08/17/2017  She was on statins but taken off b/c transaminitis in 10/2013. She was on Atorvastatin >> mm cramps.  Now on Crestor 10 mg daily: No muscle aches. On Fish Oil. - last eye exam -04/2017: No DR. She has cataract. -No numbness and tingling in her feet.  She will go on a [employer] at the end of the week.  ROS: Constitutional: no weight gain/no weight loss, no fatigue, no subjective hyperthermia, no subjective hypothermia Eyes: no blurry vision, no xerophthalmia ENT: no sore throat, no nodules palpated in throat, no dysphagia, no odynophagia, no hoarseness Cardiovascular: no CP/no SOB/no palpitations/no leg swelling Respiratory: no cough/no SOB/no wheezing Gastrointestinal: no N/no V/no D/no C/no acid reflux Musculoskeletal: + muscle aches/+ joint aches Skin: no rashes, + hair loss Neurological: no tremors/no numbness/no tingling/no dizziness  I reviewed pt's medications, allergies, PMH, social hx, family hx, and changes were documented in the history of present illness. Otherwise, unchanged from my  initial visit note. Started Flexeril.  PE: BP 116/78   Pulse 80   Ht 5\' 8"  (1.727 m)   Wt 207 lb (93.9 kg)   SpO2 98%   BMI 31.47 kg/m  Body mass index is 31.47 kg/m.  Wt Readings from Last 3 Encounters:  08/19/17 207 lb (93.9 kg)  08/17/17 208 lb (94.3 kg)  07/13/17 214 lb (97.1 kg)   Constitutional: overweight, in NAD Eyes: PERRLA, EOMI, no exophthalmos ENT: moist mucous membranes, no thyromegaly, no cervical lymphadenopathy Cardiovascular: RRR, No MRG Respiratory: CTA B Gastrointestinal: abdomen soft, NT, ND, BS+ Musculoskeletal:  no deformities, strength intact in all 4 Skin: moist, warm, no rashes Neurological: + tremor with outstretched hands, DTR normal in all 4  ASSESSMENT: 1. DM2, insulin-dependent, controlled, without long term complications  2. HL  PLAN:  1. Patient with long-standing, previously uncontrolled diabetes, now with better control on low-dose basal insulin and also metformin and Januvia.  She was having diarrhea with regular metformin so we switched to the extended release formulation.  Since the diarrhea improved but not resolved, we decreased the dose of metformin to 500 mg 2-3 times a day. - We discussed about the freestyle libre CGM at last visit >> she got this >> reviewed traces >> sugars definitely improved after she started the Whole 30 diet. No need to change the med regimen. - I suggested to:  Patient Instructions  Please continue: - Levemir 14 units at bedtime. - Metformin ER 1500 mg daily - Januvia 100 mg in a.m.  Please come back for a follow-up appointment in 4 months.  - today, HbA1c is 6.8% (higher) - continue checking sugars at different times of the day - check with the CGM - advised for yearly eye exams >> she is UTD - Return to clinic in 4 mo with sugar log    2. HL -Reviewed latest lipid panel from 08/2017: Lipid fractions have improved, especially her LDL -Continues Crestor without side effects. Also, fish oil.  09/2017, MD PhD [facility] Endocrinology

## 2017-08-19 NOTE — Patient Instructions (Signed)
Please continue: - Levemir 14 units at bedtime. - Metformin ER 1500 mg daily - Januvia 100 mg in a.m.  Please come back for a follow-up appointment in 4 months.

## 2017-10-05 ENCOUNTER — Other Ambulatory Visit: Payer: Self-pay

## 2017-10-05 ENCOUNTER — Emergency Department (HOSPITAL_COMMUNITY): Payer: PRIVATE HEALTH INSURANCE

## 2017-10-05 ENCOUNTER — Encounter (HOSPITAL_COMMUNITY): Payer: Self-pay | Admitting: Emergency Medicine

## 2017-10-05 ENCOUNTER — Emergency Department (HOSPITAL_COMMUNITY)
Admission: EM | Admit: 2017-10-05 | Discharge: 2017-10-05 | Disposition: A | Discharge status: Home / Self Care | Payer: PRIVATE HEALTH INSURANCE | Attending: Emergency Medicine | Admitting: Emergency Medicine

## 2017-10-05 DIAGNOSIS — I10 Essential (primary) hypertension: Secondary | ICD-10-CM | POA: Insufficient documentation

## 2017-10-05 DIAGNOSIS — Z96651 Presence of right artificial knee joint: Secondary | ICD-10-CM | POA: Insufficient documentation

## 2017-10-05 DIAGNOSIS — E119 Type 2 diabetes mellitus without complications: Secondary | ICD-10-CM | POA: Diagnosis not present

## 2017-10-05 DIAGNOSIS — Z794 Long term (current) use of insulin: Secondary | ICD-10-CM | POA: Insufficient documentation

## 2017-10-05 DIAGNOSIS — Z79899 Other long term (current) drug therapy: Secondary | ICD-10-CM | POA: Diagnosis not present

## 2017-10-05 DIAGNOSIS — R0789 Other chest pain: Secondary | ICD-10-CM | POA: Diagnosis not present

## 2017-10-05 DIAGNOSIS — R079 Chest pain, unspecified: Secondary | ICD-10-CM | POA: Diagnosis present

## 2017-10-05 LAB — CBC WITH DIFFERENTIAL/PLATELET
BASOS PCT: 0 %
Basophils Absolute: 0 10*3/uL (ref 0.0–0.1)
Eosinophils Absolute: 0.2 10*3/uL (ref 0.0–0.7)
Eosinophils Relative: 3 %
HEMATOCRIT: 33.6 % — AB (ref 36.0–46.0)
HEMOGLOBIN: 10.7 g/dL — AB (ref 12.0–15.0)
Lymphocytes Relative: 39 %
Lymphs Abs: 3 10*3/uL (ref 0.7–4.0)
MCH: 26.8 pg (ref 26.0–34.0)
MCHC: 31.8 g/dL (ref 30.0–36.0)
MCV: 84 fL (ref 78.0–100.0)
Monocytes Absolute: 0.7 10*3/uL (ref 0.1–1.0)
Monocytes Relative: 9 %
NEUTROS ABS: 3.8 10*3/uL (ref 1.7–7.7)
NEUTROS PCT: 49 %
Platelets: 243 10*3/uL (ref 150–400)
RBC: 4 MIL/uL (ref 3.87–5.11)
RDW: 15.8 % — ABNORMAL HIGH (ref 11.5–15.5)
WBC: 7.8 10*3/uL (ref 4.0–10.5)

## 2017-10-05 LAB — BASIC METABOLIC PANEL
ANION GAP: 15 (ref 5–15)
BUN: 14 mg/dL (ref 6–20)
CHLORIDE: 104 mmol/L (ref 101–111)
CO2: 19 mmol/L — AB (ref 22–32)
Calcium: 8.5 mg/dL — ABNORMAL LOW (ref 8.9–10.3)
Creatinine, Ser: 0.83 mg/dL (ref 0.44–1.00)
GFR calc non Af Amer: >60 mL/min (ref >60)
Glucose, Bld: 147 mg/dL — ABNORMAL HIGH (ref 65–99)
POTASSIUM: 3.8 mmol/L (ref 3.5–5.1)
Sodium: 138 mmol/L (ref 135–145)

## 2017-10-05 LAB — TROPONIN I: Troponin I: <0.03 ng/mL (ref <0.03)

## 2017-10-05 LAB — D-DIMER, QUANTITATIVE (NOT AT ARMC): D DIMER QUANT: 0.98 ug{FEU}/mL — AB (ref 0.00–0.50)

## 2017-10-05 MED ORDER — ACETAMINOPHEN 325 MG PO TABS
650.0000 mg | ORAL_TABLET | Freq: Once | ORAL | Status: AC
  Administered 2017-10-05: 650 mg via ORAL
  Filled 2017-10-05: qty 2

## 2017-10-05 MED ORDER — IOPAMIDOL (ISOVUE-370) INJECTION 76%
100.0000 mL | Freq: Once | INTRAVENOUS | Status: AC | PRN
  Administered 2017-10-05: 100 mL via INTRAVENOUS

## 2017-10-05 MED ORDER — ASPIRIN 325 MG PO TABS
325.0000 mg | ORAL_TABLET | Freq: Once | ORAL | Status: AC
  Administered 2017-10-05: 325 mg via ORAL
  Filled 2017-10-05: qty 1

## 2017-10-05 NOTE — ED Triage Notes (Signed)
Pt states she woke up to chest tightness.

## 2017-10-05 NOTE — ED Provider Notes (Signed)
[facility] EMERGENCY DEPARTMENT Provider Note   CSN: [id number] Arrival date & time: 10/05/17  4562     History   Chief Complaint Chief Complaint  Patient presents with  . Chest Pain    HPI Raven [name] is a 62 y.o. female.  HPI  This is a 62 year old female with a history of diabetes, hypertension, hyperlipidemia who presents with chest pain.  Patient reports that she awoke from sleep at 4 AM with substernal pressure-like chest pain.  She states "it felt like there was a belt around my chest."  This lasted approximately an hour.  Nothing seemed to make it better or worse.  Initially she thought it might be indigestion.  Not improved with sitting upright.  Denies any nausea or vomiting.  She states it was difficult to take a big deep breath but did not have any shortness of breath.  Denies any recent fevers or chills.  Reports early family history of heart disease with a brother who died in his 26s from an MI.  She has not had any cardiac evaluation in over 20 years.  Currently she is pain free.  He denies any leg swelling.  [place]  [place] was a greater than 6-hour flight.  Past Medical History:  Diagnosis Date  . Abnormal mammogram 02/09/2016   Left breast distortion-?mass?-bilat breast calcifications: bx to be done as of 02/09/16  . Acute medial meniscus tear of right knee   . Anemia of chronic disease   . Anxiety   . Diabetes mellitus 1995   Managed by Dr. Elvera Lennox (Endo)  . GERD (gastroesophageal reflux disease) per pt watches diet and take tums as needed  . H/O hiatal hernia   . Hyperlipidemia   . Hypertension   . Interstitial cystitis   . Lichen sclerosus of female genitalia   . Migraine   . Osteoarthritis   . Psoriasis   . Seasonal allergies   . Seropositive rheumatoid arthritis (HCC)    Hands, knees, jaw, elbow: responding to Simponi as of 09/29/15 rheum f/u.  Waning effectiveness on 03/2016 f/u so pt switched to Orencia  injections.  Chronic low-dose prednisone therapy+ methotrexate.    Patient Active Problem List   Diagnosis Date Noted  . UTI (urinary tract infection) 10/31/2015  . Acute bronchitis 12/18/2013  . Rheumatoid arthritis(714.0) 07/27/2012  . Intention tremor 07/27/2012  . Anxiety state 04/14/2009  . Insulin dependent type 2 diabetes mellitus, controlled (HCC) 12/03/2008  . CARPAL TUNNEL SYNDROME, BILATERAL 03/06/2008  . Essential hypertension 03/06/2008  . GERD 03/06/2008  . Hyperlipidemia 12/01/2007  . MIGRAINE HEADACHE 12/01/2007    Past Surgical History:  Procedure Laterality Date  . ABDOMINAL HYSTERECTOMY  1991   for fibroids; ovaries still in  . BREAST BIOPSY  2001; 02/12/16   fibrocystic breast disease in 2001 and again in 02/2016 (+scar from prev bx site w/calcifications).  . COLONOSCOPY  09/12/2013   Recall 5 yrs ([facility], Dr. Evette Cristal) due to FH of colon polyps.  Manfred Shirts   for chronic UTI  . DEXA  10/2016   Bone density normal (T-score 0.0)  . ESOPHAGOGASTRODUODENOSCOPY  04/19/2011   Nl except antral gastritis: bx = mild chron gastritis (H pyloria neg)   . intraarticular steroid injection  2013   3 R knee & L X 1; Dr Netta Corrigan  . KNEE ARTHROCENTESIS  2013   R knee x 3 & L X 1  . KNEE  ARTHROSCOPY  10/21/2011   Procedure: ARTHROSCOPY KNEE;  Surgeon: Jacki Cones, MD;  Location: [facility];  Service: Orthopedics;  Laterality: Right;  WITH MEDIAL and lateral shaving of femoral chondyl  with microfracture technique of lateral and medial femoral chondyl suprapatellar synovectomy  . TOTAL KNEE ARTHROPLASTY  04/12/2012   Procedure: TOTAL KNEE ARTHROPLASTY;  Surgeon: Jacki Cones, MD;  Location: [facility];  Service: Orthopedics;  Laterality: Right;  Right Total Knee Arthroplasty  . TUBAL LIGATION  1981     OB History   None      Home Medications    Prior to Admission medications   Medication Sig Start Date End Date Taking? Authorizing Provider    Abatacept (ORENCIA) 50 MG/0.4ML SOSY Inject 1 Dose into the skin once a week.   Yes [provider]  cetirizine (ZYRTEC) 10 MG tablet Take 10 mg by mouth every evening.    Yes [provider]  Continuous Blood Gluc Receiver (FREESTYLE LIBRE READER) DEVI 1 Device by Does not apply route 3 (three) times daily. 04/19/17  Yes Carlus Pavlov, MD  Continuous Blood Gluc Sensor (FREESTYLE LIBRE SENSOR SYSTEM) MISC 1 Device by Does not apply route every 30 (thirty) days. 04/19/17  Yes Carlus Pavlov, MD  cyclobenzaprine (FLEXERIL) 5 MG tablet Take 5 mg by mouth 3 (three) times daily as needed for muscle spasms. Take 1 1/2 tablets 3 times daily   Yes [provider]  diazepam (VALIUM) 5 MG tablet Take 1 tablet (5 mg total) by mouth every 12 (twelve) hours as needed. Migraine headaches 02/11/17  Yes McGowen, Maryjean Morn, MD  FOLIC ACID PO Take 1 mg by mouth daily.    Yes [provider]  Insulin Detemir (LEVEMIR FLEXTOUCH) 100 UNIT/ML Pen INJECT 14 UNITS INTO THE SKIN DAILY AT 10PM 08/19/17  Yes Carlus Pavlov, MD  lisinopril (PRINIVIL,ZESTRIL) 20 MG tablet Take 0.5 tablets (10 mg total) by mouth daily. 08/17/17  Yes McGowen, Maryjean Morn, MD  metFORMIN (GLUCOPHAGE-XR) 500 MG 24 hr tablet Take 2 tablets (1,000 mg total) by mouth 2 (two) times daily with a meal. 12/28/16  Yes Carlus Pavlov, MD  Methotrexate Sodium (METHOTREXATE PO) Inject 1 mg as directed every Wednesday.    Yes [provider]  Omega-3 Fatty Acids (FISH OIL) 1000 MG CAPS Take by mouth 2 (two) times daily.   Yes [provider]  primidone (MYSOLINE) 50 MG tablet Take 50 mg by mouth. 3 by mouth at bedtime   Yes [provider]  ranitidine (ZANTAC) 75 MG tablet Take 75 mg by mouth 2 (two) times daily.   Yes [provider]  rosuvastatin (CRESTOR) 10 MG tablet Take 1 tablet (10 mg total) by mouth daily. 08/17/17  Yes McGowen, Maryjean Morn, MD  sertraline (ZOLOFT) 50 MG tablet Take 1.5  tablets (75 mg total) by mouth daily with breakfast. 08/17/17  Yes McGowen, Maryjean Morn, MD  sitaGLIPtin (JANUVIA) 100 MG tablet Take 1 tablet (100 mg total) by mouth daily. 08/19/17  Yes Carlus Pavlov, MD  triamcinolone (KENALOG) 0.025 % ointment Apply 1 application topically as needed.   Yes [provider]  vitamin E (VITAMIN E) 400 UNIT capsule Take 400 Units by mouth daily.   Yes [provider]  benzonatate (TESSALON) 200 MG capsule Take 1 capsule (200 mg total) by mouth 3 (three) times daily as needed for cough. 07/13/17   McGowen, Maryjean Morn, MD  hydroxychloroquine (PLAQUENIL) 200 MG tablet Take 200 mg by mouth  2 (two) times daily. Added by rheum 12/23/15    [provider]    Family History Family History  Problem Relation Age of Onset  . Diabetes Mother   . Stroke Mother 41  . Osteoarthritis Mother   . Stroke Brother 38  . Heart disease Father        CABG  . Lung cancer Father   . Prostate cancer Father   . Pancreatic cancer Father   . Pancreatic cancer Paternal Grandfather   . Bipolar disorder Daughter   . Anxiety disorder Son   . Arthritis Maternal Grandmother        rheumatoid  . Heart attack Brother 92       smoker    Social History Social History   Tobacco Use  . Smoking status: Never Smoker  . Smokeless tobacco: Never Used  Substance Use Topics  . Alcohol use: No  . Drug use: No     Allergies   Penicillins; Pravastatin; Simvastatin; and Hydrocodone-acetaminophen   Review of Systems Review of Systems  Constitutional: Negative for fever.  Respiratory: Negative for shortness of breath.   Cardiovascular: Positive for chest pain. Negative for leg swelling.  Gastrointestinal: Negative for abdominal pain, nausea and vomiting.  Genitourinary: Negative for dysuria.  All other systems reviewed and are negative.    Physical Exam Updated Vital Signs BP 125/63   Pulse 65   Temp 98.3 F (36.8 C)   Resp 14   Ht 5\' 6"  (1.676 m)   Wt  91.6 kg (202 lb)   SpO2 98%   BMI 32.60 kg/m   Physical Exam  Constitutional: She is oriented to person, place, and time. She appears well-developed and well-nourished. She does not appear ill.  Overweight, no acute distress  HENT:  Head: Normocephalic and atraumatic.  Cardiovascular: Normal rate, regular rhythm and normal heart sounds.  Pulmonary/Chest: Effort normal. No respiratory distress. She has no wheezes.  Abdominal: Soft. Bowel sounds are normal.  Musculoskeletal:       Right lower leg: She exhibits no edema.       Left lower leg: She exhibits no edema.  Neurological: She is alert and oriented to person, place, and time.  Skin: Skin is warm and dry.  Psychiatric: She has a normal mood and affect.  Nursing note and vitals reviewed.    ED Treatments / Results  Labs (all labs ordered are listed, but only abnormal results are displayed) Labs Reviewed  CBC WITH DIFFERENTIAL/PLATELET - Abnormal; Notable for the following components:      Result Value   Hemoglobin 10.7 (*)    HCT 33.6 (*)    RDW 15.8 (*)    All other components within normal limits  BASIC METABOLIC PANEL - Abnormal; Notable for the following components:   CO2 19 (*)    Glucose, Bld 147 (*)    Calcium 8.5 (*)    All other components within normal limits  D-DIMER, QUANTITATIVE (NOT AT [facility]) - Abnormal; Notable for the following components:   D-Dimer, Quant 0.98 (*)    All other components within normal limits  TROPONIN I  TROPONIN I    EKG EKG Interpretation  Date/Time:  Wednesday October 05 2017 05:23:15 EDT Ventricular Rate:  85 PR Interval:    QRS Duration: 104 QT Interval:  400 QTC Calculation: 476 R Axis:   44 Text Interpretation:  Sinus rhythm Low voltage, precordial leads Confirmed by Ross Marcus ([id number]) on 10/05/2017 5:26:21 AM   Radiology Dg Chest  2 View  Result Date: 10/05/2017 CLINICAL DATA:  62 y/o  F; chest pain. EXAM: CHEST - 2 VIEW COMPARISON:  04/07/2012 chest radiograph  FINDINGS: Stable cardiac silhouette within normal limits given projection and technique. Aortic atherosclerosis with calcification. Clear lungs. No pleural effusion or pneumothorax. Moderate hiatal hernia is increased in size. No acute osseous abnormality identified IMPRESSION: 1. Moderate hiatal hernia is increased in size. 2. No acute pulmonary process identified. 3. Aortic atherosclerosis. Electronically Signed   By: Mitzi Hansen M.D.   On: 10/05/2017 07:00    Procedures Procedures (including critical care time)  Medications Ordered in ED Medications  iopamidol (ISOVUE-370) 76 % injection 100 mL (has no administration in time range)  aspirin tablet 325 mg (325 mg Oral Given 10/05/17 0547)     Initial Impression / Assessment and Plan / ED Course  I have reviewed the triage vital signs and the nursing notes.  Pertinent labs & imaging results that were available during my care of the patient were reviewed by me and considered in my medical decision making (see chart for details).     She presents with chest pain.  Lasted approximately 1 hour.  Vital signs are reassuring.  Patient is stable.  EKG is normal without signs of ischemia.  However, patient does have risk factors including hypertension, hyperlipidemia, diabetes, family history.  Her heart score is 3-4 depending on how you score her history slightly versus moderately suspicious.  She is pain-free.  Patient was given aspirin.  D-dimer was sent given recent long travel although she has no objective evidence of DVT.  Initial troponin is negative.  She remains chest pain-free.  See clinical course above.  If CTA is negative and repeat troponin at 9 AM remains negative, patient needs to follow-up closely with cardiology as an outpatient for stress testing and functional testing.  If she develops any recurrent chest pain or any of her testing comes back positive she will need admission.  Final Clinical Impressions(s) / ED Diagnoses    Final diagnoses:  None    ED Discharge Orders    None       Kuper Rennels, Mayer Masker, MD 10/05/17 [phone]

## 2017-10-05 NOTE — Discharge Instructions (Addendum)
Call Dr. Verna Czech office today to schedule an appointment.  Tell office staff that you were seen in the emergency department today. Return if concern for any reason

## 2017-10-05 NOTE — ED Provider Notes (Addendum)
Patient signed out to me .patient awakened at 4 AM today with a bandlike discomfort under both breasts which was pleuritic in quality lasted 2 hours resolve spontaneously without treatment.  She denies any shortness of breath nausea or sweatiness.  Symptoms resolved spontaneously without treatment.  She reports eating a hamburger for dinner last night.  She is presently asymptomatic except for a mild diffuse headache.  No other associated symptoms.  She is never had similar symptoms before.  On exam alert no distress HEENT exam no facial asymmetry neck supple no bruit lungs clear to auscultation heart regular rate and rhythm no murmurs abdomen nondistended nontender extremities without edema. Patient's heart score equals 3 based on story age and risk factors  10:15 AM patient remains asymptomatic. Chest x-ray viewed by me.  Patient was advised by me as to diagnostic test results Results for orders placed or performed during the hospital encounter of 10/05/17  CBC with Differential  Result Value Ref Range   WBC 7.8 4.0 - 10.5 K/uL   RBC 4.00 3.87 - 5.11 MIL/uL   Hemoglobin 10.7 (L) 12.0 - 15.0 g/dL   HCT 95.2 (L) 84.1 - 32.4 %   MCV 84.0 78.0 - 100.0 fL   MCH 26.8 26.0 - 34.0 pg   MCHC 31.8 30.0 - 36.0 g/dL   RDW 40.1 (H) 02.7 - 25.3 %   Platelets 243 150 - 400 K/uL   Neutrophils Relative % 49 %   Neutro Abs 3.8 1.7 - 7.7 K/uL   Lymphocytes Relative 39 %   Lymphs Abs 3.0 0.7 - 4.0 K/uL   Monocytes Relative 9 %   Monocytes Absolute 0.7 0.1 - 1.0 K/uL   Eosinophils Relative 3 %   Eosinophils Absolute 0.2 0.0 - 0.7 K/uL   Basophils Relative 0 %   Basophils Absolute 0.0 0.0 - 0.1 K/uL  Basic metabolic panel  Result Value Ref Range   Sodium 138 135 - 145 mmol/L   Potassium 3.8 3.5 - 5.1 mmol/L   Chloride 104 101 - 111 mmol/L   CO2 19 (L) 22 - 32 mmol/L   Glucose, Bld 147 (H) 65 - 99 mg/dL   BUN 14 6 - 20 mg/dL   Creatinine, Ser 6.64 0.44 - 1.00 mg/dL   Calcium 8.5 (L) 8.9 - 10.3 mg/dL    GFR calc non Af Amer >60 >60 mL/min   GFR calc Af Amer >60 >60 mL/min   Anion gap 15 5 - 15  Troponin I  Result Value Ref Range   Troponin I <0.03 <0.03 ng/mL  D-dimer, quantitative (not at [facility])  Result Value Ref Range   D-Dimer, Quant 0.98 (H) 0.00 - 0.50 ug/mL-FEU  Troponin I  Result Value Ref Range   Troponin I <0.03 <0.03 ng/mL   Dg Chest 2 View  Result Date: 10/05/2017 CLINICAL DATA:  62 y/o  F; chest pain. EXAM: CHEST - 2 VIEW COMPARISON:  04/07/2012 chest radiograph FINDINGS: Stable cardiac silhouette within normal limits given projection and technique. Aortic atherosclerosis with calcification. Clear lungs. No pleural effusion or pneumothorax. Moderate hiatal hernia is increased in size. No acute osseous abnormality identified IMPRESSION: 1. Moderate hiatal hernia is increased in size. 2. No acute pulmonary process identified. 3. Aortic atherosclerosis. Electronically Signed   By: Mitzi Hansen M.D.   On: 10/05/2017 07:00   Ct Angio Chest Pe W And/or Wo Contrast  Result Date: 10/05/2017 CLINICAL DATA:  Chest pain.  Recent long airplane flight EXAM: CT ANGIOGRAPHY CHEST WITH CONTRAST  TECHNIQUE: Multidetector CT imaging of the chest was performed using the standard protocol during bolus administration of intravenous contrast. Multiplanar CT image reconstructions and MIPs were obtained to evaluate the vascular anatomy. CONTRAST:  ISOVUE-370 IOPAMIDOL (ISOVUE-370) INJECTION 76% COMPARISON:  Chest radiograph October 05, 2017 FINDINGS: Cardiovascular: There is no demonstrable pulmonary embolus. There is no thoracic aortic aneurysm or dissection. Visualized great vessels appear normal. There is atherosclerotic calcification in the aorta as well as foci of coronary artery calcification. There is no appreciable pericardial effusion or pericardial thickening. Mediastinum/Nodes: Thyroid appears normal. There is no appreciable thoracic adenopathy. There is a moderate hiatal hernia.  Lungs/Pleura: There is mild lower lobe atelectatic change bilaterally. There is no appreciable edema or consolidation. No pleural effusion or pleural thickening evident. Upper Abdomen: A small gallstone is noted in the gallbladder. Gallbladder wall does not appear thickened. Visualized upper abdominal structures otherwise appear normal. Musculoskeletal: There is degenerative change in the thoracic spine. There are no blastic or lytic bone lesions. No evident chest wall lesions. Review of the MIP images confirms the above findings. IMPRESSION: 1. No demonstrable pulmonary embolus. No thoracic aortic aneurysm or dissection. 2.  Moderate hiatal hernia. 3.  Areas of mild atelectatic change.  No edema or consolidation. 4.  No appreciable thoracic adenopathy. 5. Atherosclerotic calcification in the aorta. Foci of coronary artery calcification noted. 6. Small gallstone in gallbladder. No gallbladder wall thickening evident. Aortic Atherosclerosis (ICD10-I70.0). Electronically Signed   By: Bretta Bang III M.D.   On: 10/05/2017 07:47   Assessment patient requires outpatient cardiac evaluation however hiatal hernia and gallstone may also be because of her symptoms.  Plan referral Dr. Wyline Mood  Lab work consistent with mild anemia Diagnosis #1 atypical chest pain 2 anemia   Doug Sou, MD 10/05/17 1020    Doug Sou, MD 10/05/17 1022

## 2017-10-10 DIAGNOSIS — R002 Palpitations: Secondary | ICD-10-CM

## 2017-10-10 DIAGNOSIS — R079 Chest pain, unspecified: Secondary | ICD-10-CM

## 2017-10-10 HISTORY — PX: OTHER SURGICAL HISTORY: SHX169

## 2017-10-10 HISTORY — DX: Chest pain, unspecified: R07.9

## 2017-10-10 HISTORY — DX: Palpitations: R00.2

## 2017-10-14 ENCOUNTER — Encounter: Payer: Self-pay | Admitting: Family Medicine

## 2017-10-14 ENCOUNTER — Ambulatory Visit: Payer: PRIVATE HEALTH INSURANCE | Admitting: Family Medicine

## 2017-10-14 VITALS — BP 116/78 | HR 78 | Temp 97.9°F | Ht 66.0 in | Wt 207.4 lb

## 2017-10-14 DIAGNOSIS — K8020 Calculus of gallbladder without cholecystitis without obstruction: Secondary | ICD-10-CM | POA: Diagnosis not present

## 2017-10-14 DIAGNOSIS — F411 Generalized anxiety disorder: Secondary | ICD-10-CM | POA: Diagnosis not present

## 2017-10-14 DIAGNOSIS — Z87898 Personal history of other specified conditions: Secondary | ICD-10-CM

## 2017-10-14 DIAGNOSIS — F330 Major depressive disorder, recurrent, mild: Secondary | ICD-10-CM | POA: Diagnosis not present

## 2017-10-14 DIAGNOSIS — K449 Diaphragmatic hernia without obstruction or gangrene: Secondary | ICD-10-CM

## 2017-10-14 MED ORDER — SERTRALINE HCL 50 MG PO TABS: ORAL_TABLET | ORAL | 3 refills | Status: DC

## 2017-10-14 NOTE — Progress Notes (Addendum)
OFFICE VISIT  10/14/2017   CC:  Chief Complaint  Patient presents with  . Follow-up    ER     HPI:    Patient is a 62 y.o. Caucasian female who presents for ER follow up. Went to [facility] ED 10/05/17 for chest pain.  Reviewed notes and testing done at that visit today. NO evidence of ACS, CXR neg acute, CT angio chest neg for PE.  CT did pick up a moderate size hiatal hernia and a small gallstone w/out GB wall thickening.  BMET normal, CBC remarkable only for stable chronic normocytic anemia.  D-dimer slightly elevated.   Pain resolved spontaneously, no new meds at d/c home.  Has been set up to see cardiologist for consideration of stress testing.  Since going home she has not had pain to the severity of when she went to ED but has had recurrent similar (although milder/moderate severity) episodes of pain in upper abdomen diffusely, occurs consistently right after finishing eating--no matter what she eats.  Some nausea with the episodes, vomited on one occasion.  No CP, no SOB.  Exertion does not bring on any symptoms. She does not hurt in her chest at all.  Episodes last approx 60-90 min.  No fevers. She takes zantac 75mg  bid--long term.  Denies active GERD sx's.  Of note, the episode that provoked her ED visit was the first time this pain has happened.  At the end of the visit today, she asked if she could increase her zoloft.  States she has been feeling more depressed, overwhelmed, hopeless, anhedonia.  Increased stress in her life lately. She is currently taking 1 and 1/2 of the 50mg  sertraline tabs daily. Denies SI or HI.   Past Medical History:  Diagnosis Date  . Abnormal mammogram 02/09/2016   Left breast distortion-?mass?-bilat breast calcifications: bx to be done as of 02/09/16  . Acute medial meniscus tear of right knee   . Anemia of chronic disease   . Anxiety   . Diabetes mellitus 1995   Managed by Dr. 02/11/2016 (Endo)  . GERD (gastroesophageal reflux disease) per pt watches  diet and take tums as needed  . H/O hiatal hernia   . Hyperlipidemia   . Hypertension   . Interstitial cystitis   . Lichen sclerosus of female genitalia   . Migraine   . Osteoarthritis   . Psoriasis   . Seasonal allergies   . Seropositive rheumatoid arthritis (HCC)    Hands, knees, jaw, elbow: responding to Simponi as of 09/29/15 rheum f/u.  Waning effectiveness on 03/2016 f/u so pt switched to Orencia injections.  Chronic low-dose prednisone therapy+ methotrexate.    Past Surgical History:  Procedure Laterality Date  . ABDOMINAL HYSTERECTOMY  1991   for fibroids; ovaries still in  . BREAST BIOPSY  2001; 02/12/16   fibrocystic breast disease in 2001 and again in 02/2016 (+scar from prev bx site w/calcifications).  . COLONOSCOPY  09/12/2013   Recall 5 yrs ([facility], Dr. 03/2016) due to FH of colon polyps.  11/12/2013   for chronic UTI  . DEXA  10/2016   Bone density normal (T-score 0.0)  . ESOPHAGOGASTRODUODENOSCOPY  04/19/2011   Nl except antral gastritis: bx = mild chron gastritis (H pyloria neg)   . intraarticular steroid injection  2013   3 R knee & L X 1; Dr 06/19/2011  . KNEE ARTHROCENTESIS  2013   R knee x 3 & L X 1  . KNEE ARTHROSCOPY  10/21/2011   Procedure: ARTHROSCOPY KNEE;  Surgeon: Jacki Cones, MD;  Location: [facility];  Service: Orthopedics;  Laterality: Right;  WITH MEDIAL and lateral shaving of femoral chondyl  with microfracture technique of lateral and medial femoral chondyl suprapatellar synovectomy  . TOTAL KNEE ARTHROPLASTY  04/12/2012   Procedure: TOTAL KNEE ARTHROPLASTY;  Surgeon: Jacki Cones, MD;  Location: [facility];  Service: Orthopedics;  Laterality: Right;  Right Total Knee Arthroplasty  . TUBAL LIGATION  1981    Outpatient Medications Prior to Visit  Medication Sig Dispense Refill  . Abatacept (ORENCIA) 50 MG/0.4ML SOSY Inject 1 Dose into the skin once a week.    . cetirizine (ZYRTEC) 10 MG tablet Take 10 mg by mouth every  evening.     . Continuous Blood Gluc Receiver (FREESTYLE LIBRE READER) DEVI 1 Device by Does not apply route 3 (three) times daily. 1 Device 1  . Continuous Blood Gluc Sensor (FREESTYLE LIBRE SENSOR SYSTEM) MISC 1 Device by Does not apply route every 30 (thirty) days. 3 each 11  . cyclobenzaprine (FLEXERIL) 5 MG tablet Take 5 mg by mouth 3 (three) times daily as needed for muscle spasms. Take 1 1/2 tablets 3 times daily    . diazepam (VALIUM) 5 MG tablet Take 1 tablet (5 mg total) by mouth every 12 (twelve) hours as needed. Migraine headaches 60 tablet 5  . FOLIC ACID PO Take 1 mg by mouth daily.     . Insulin Detemir (LEVEMIR FLEXTOUCH) 100 UNIT/ML Pen INJECT 14 UNITS INTO THE SKIN DAILY AT 10PM 15 mL 11  . lisinopril (PRINIVIL,ZESTRIL) 20 MG tablet Take 0.5 tablets (10 mg total) by mouth daily. 45 tablet 3  . metFORMIN (GLUCOPHAGE-XR) 500 MG 24 hr tablet Take 2 tablets (1,000 mg total) by mouth 2 (two) times daily with a meal. 360 tablet 3  . Methotrexate Sodium (METHOTREXATE PO) Inject 1 mg as directed every Wednesday.     . Omega-3 Fatty Acids (FISH OIL) 1000 MG CAPS Take by mouth 2 (two) times daily.    . primidone (MYSOLINE) 50 MG tablet Take 50 mg by mouth. 3 by mouth at bedtime    . ranitidine (ZANTAC) 75 MG tablet Take 75 mg by mouth 2 (two) times daily.    . rosuvastatin (CRESTOR) 10 MG tablet Take 1 tablet (10 mg total) by mouth daily. 90 tablet 3  . sertraline (ZOLOFT) 50 MG tablet Take 1.5 tablets (75 mg total) by mouth daily with breakfast. 135 tablet 3  . sitaGLIPtin (JANUVIA) 100 MG tablet Take 1 tablet (100 mg total) by mouth daily. 90 tablet 3  . triamcinolone (KENALOG) 0.025 % ointment Apply 1 application topically as needed.    . vitamin E (VITAMIN E) 400 UNIT capsule Take 400 Units by mouth daily.    . benzonatate (TESSALON) 200 MG capsule Take 1 capsule (200 mg total) by mouth 3 (three) times daily as needed for cough. 30 capsule 1  . hydroxychloroquine (PLAQUENIL) 200 MG  tablet Take 200 mg by mouth 2 (two) times daily. Added by rheum 12/23/15     No facility-administered medications prior to visit.     Allergies  Allergen Reactions  . Penicillins Rash  . Pravastatin     Myalgias  . Simvastatin     Myalgias  . Hydrocodone-Acetaminophen Nausea And Vomiting    ROS As per HPI  PE: Blood pressure 116/78, pulse 78, temperature 97.9 F (36.6 C), temperature source Oral, height 5\' 6"  (1.676  m), weight 207 lb 6.4 oz (94.1 kg), SpO2 98 %. Body mass index is 33.48 kg/m.  Gen: Alert, well appearing.  Patient is oriented to person, place, time, and situation. AFFECT: pleasant, lucid thought and speech. ZOX:WRUE: no injection, icteris, swelling, or exudate.  EOMI, PERRLA. Mouth: lips without lesion/swelling.  Oral mucosa pink and moist. Oropharynx without erythema, exudate, or swelling.  CV: RRR, no m/r/g.   LUNGS: CTA bilat, nonlabored resps, good aeration in all lung fields. ABD: soft, NT, ND, BS normal.  No hepatospenomegaly or mass.  No bruits. EXT: no clubbing, cyanosis, or edema.   LABS:    Chemistry      Component Value Date/Time   NA 138 10/05/2017 0553   K 3.8 10/05/2017 0553   CL 104 10/05/2017 0553   CO2 19 (L) 10/05/2017 0553   BUN 14 10/05/2017 0553   CREATININE 0.83 10/05/2017 0553      Component Value Date/Time   CALCIUM 8.5 (L) 10/05/2017 0553   ALKPHOS 47 02/11/2017 1055   AST 16 02/11/2017 1055   ALT 16 02/11/2017 1055   BILITOT 0.4 02/11/2017 1055     CBC    Component Value Date/Time   WBC 7.8 10/05/2017 0553   RBC 4.00 10/05/2017 0553   HGB 10.7 (L) 10/05/2017 0553   HCT 33.6 (L) 10/05/2017 0553   PLT 243 10/05/2017 0553   MCV 84.0 10/05/2017 0553   MCH 26.8 10/05/2017 0553   MCHC 31.8 10/05/2017 0553   RDW 15.8 (H) 10/05/2017 0553   LYMPHSABS 3.0 10/05/2017 0553   MONOABS 0.7 10/05/2017 0553   EOSABS 0.2 10/05/2017 0553   BASOSABS 0.0 10/05/2017 0553   Lab Results  Component Value Date   DDIMER 0.98 (H)  10/05/2017   Lab Results  Component Value Date   CKTOTAL 29 11/26/2014   TROPONINI <0.03 10/05/2017    IMPRESSION AND PLAN:  1) Symptomatic cholelithiasis. Will check CMET today. Referral ordered for general surgery today. She will also proceed with the already arranged cardiology visit as planned.  2) MDD, mild episode, recurrent. Increase sertraline to 150mg  qd dosing (3 of the 50mg  tabs qd).  New rx sent in today.  An After Visit Summary was printed and given to the patient.  FOLLOW UP: Return in about 4 months (around 02/13/2018) for routine chronic illness f/u.  Signed:  , MD           10/14/2017

## 2017-10-14 NOTE — Addendum Note (Signed)
Addended by: Jeoffrey Massed on: 10/14/2017 05:36 PM   Modules accepted: Orders

## 2017-10-15 LAB — COMPREHENSIVE METABOLIC PANEL
AG Ratio: 1.8 (calc) (ref 1.0–2.5)
ALBUMIN MSPROF: 4.2 g/dL (ref 3.6–5.1)
ALKALINE PHOSPHATASE (APISO): 64 U/L (ref 33–130)
ALT: 20 U/L (ref 6–29)
AST: 21 U/L (ref 10–35)
BUN: 10 mg/dL (ref 7–25)
CO2: 27 mmol/L (ref 20–32)
Calcium: 8.9 mg/dL (ref 8.6–10.4)
Chloride: 102 mmol/L (ref 98–110)
Creat: 0.82 mg/dL (ref 0.50–0.99)
Globulin: 2.4 g/dL (calc) (ref 1.9–3.7)
Glucose, Bld: 219 mg/dL — ABNORMAL HIGH (ref 65–99)
POTASSIUM: 4.5 mmol/L (ref 3.5–5.3)
Sodium: 138 mmol/L (ref 135–146)
TOTAL PROTEIN: 6.6 g/dL (ref 6.1–8.1)
Total Bilirubin: 0.3 mg/dL (ref 0.2–1.2)

## 2017-10-15 LAB — EXTRA LAV TOP TUBE

## 2017-10-17 ENCOUNTER — Encounter: Payer: Self-pay | Admitting: *Deleted

## 2017-10-20 ENCOUNTER — Ambulatory Visit: Payer: PRIVATE HEALTH INSURANCE | Admitting: Cardiology

## 2017-10-20 ENCOUNTER — Telehealth: Payer: Self-pay | Admitting: Cardiology

## 2017-10-20 ENCOUNTER — Encounter: Payer: Self-pay | Admitting: Cardiology

## 2017-10-20 ENCOUNTER — Encounter: Payer: Self-pay | Admitting: *Deleted

## 2017-10-20 VITALS — BP 127/82 | HR 78 | Ht 68.0 in | Wt 208.0 lb

## 2017-10-20 DIAGNOSIS — R079 Chest pain, unspecified: Secondary | ICD-10-CM

## 2017-10-20 DIAGNOSIS — R002 Palpitations: Secondary | ICD-10-CM

## 2017-10-20 NOTE — Patient Instructions (Addendum)
Medication Instructions:  Continue all current medications.  Labwork: none  Testing/Procedures:  Your physician has requested that you have a lexiscan myoview. For further information please visit [email or web address] Please follow instruction sheet, as given.  Your physician has recommended that you wear a 14 day event monitor. Event monitors are medical devices that record the heart's electrical activity. Doctors most often [place] these monitors to diagnose arrhythmias. Arrhythmias are problems with the speed or rhythm of the heartbeat. The monitor is a small, portable device. You can wear one while you do your normal daily activities. This is usually used to diagnose what is causing palpitations/syncope (passing out).  Office will contact with results via phone or letter.    Follow-Up: To be determined.    Any Other Special Instructions Will Be Listed Below (If Applicable).  If you need a refill on your cardiac medications before your next appointment, please call your pharmacy.

## 2017-10-20 NOTE — Progress Notes (Signed)
Clinical Summary Raven [name] is a 62 y.o.female seen as new consult, referred by Dr Milinda Cave for chest pain.   1. Chest pain - seen in ER 10/05/17 with chest pain.  - Trop neg x 2, D-dimer0.98. CT PE neg for PE. Noted aortic atherosclerosis and CAD. +gallstone -EKG SR, no ischemic changes. CXR hiatal hernia, no acute rpcoess  - awoke from sleep. Squeezing like feeling belt like across below breast. Difficultly with deep breath.  - not positional. Got up and sat in recliner, not improved.  - pain eased up on its own. Pain started 4AM, over by 7AM.  - repeat episode like episode. Not as intense.  - sedentary lifestyle. No recent SOB/DOE based on her level of activity -    CAD risk factors: HL, DM2, brother history of MI age 10, father with MI she thinks late 60s, RA - unable to run on treadmill, knee replacement  2. Palpitations - on and off for some time, increased in frequency and more severe - feeling of heart racing, feels flushed, SOB. No specific trigger - occurs 1-2 per week. Lasts just a few seconds - cut out caffeeine, has not improved.    Past Medical History:  Diagnosis Date  . Abnormal mammogram 02/09/2016   Left breast distortion-?mass?-bilat breast calcifications: bx to be done as of 02/09/16  . Acute medial meniscus tear of right knee   . Anemia of chronic disease   . Anxiety   . Diabetes mellitus 1995   Managed by Dr. Elvera Lennox (Endo)  . GERD (gastroesophageal reflux disease) per pt watches diet and take tums as needed  . H/O hiatal hernia    slightly larger ("moderate" size) on CT angio done to r/o PE 10/2017.  Marland Kitchen Hyperlipidemia   . Hypertension   . Interstitial cystitis   . Lichen sclerosus of female genitalia   . Migraine   . Osteoarthritis   . Psoriasis   . Seasonal allergies   . Seropositive rheumatoid arthritis (HCC)    Hands, knees, jaw, elbow: responding to Simponi as of 09/29/15 rheum f/u.  Waning effectiveness on 03/2016 f/u so pt switched to  Orencia injections.  Chronic low-dose prednisone therapy+ methotrexate.  . Symptomatic cholelithiasis 10/2017     Allergies  Allergen Reactions  . Penicillins Rash  . Pravastatin     Myalgias  . Simvastatin     Myalgias  . Hydrocodone-Acetaminophen Nausea And Vomiting     Current Outpatient Medications  Medication Sig Dispense Refill  . Abatacept (ORENCIA) 50 MG/0.4ML SOSY Inject 1 Dose into the skin once a week.    . cetirizine (ZYRTEC) 10 MG tablet Take 10 mg by mouth every evening.     . Continuous Blood Gluc Receiver (FREESTYLE LIBRE READER) DEVI 1 Device by Does not apply route 3 (three) times daily. 1 Device 1  . Continuous Blood Gluc Sensor (FREESTYLE LIBRE SENSOR SYSTEM) MISC 1 Device by Does not apply route every 30 (thirty) days. 3 each 11  . cyclobenzaprine (FLEXERIL) 5 MG tablet Take 5 mg by mouth 3 (three) times daily as needed for muscle spasms. Take 1 1/2 tablets 3 times daily    . diazepam (VALIUM) 5 MG tablet Take 1 tablet (5 mg total) by mouth every 12 (twelve) hours as needed. Migraine headaches 60 tablet 5  . FOLIC ACID PO Take 1 mg by mouth daily.     . Insulin Detemir (LEVEMIR FLEXTOUCH) 100 UNIT/ML Pen INJECT 14 UNITS INTO THE SKIN DAILY AT  10PM 15 mL 11  . lisinopril (PRINIVIL,ZESTRIL) 20 MG tablet Take 0.5 tablets (10 mg total) by mouth daily. 45 tablet 3  . metFORMIN (GLUCOPHAGE-XR) 500 MG 24 hr tablet Take 2 tablets (1,000 mg total) by mouth 2 (two) times daily with a meal. 360 tablet 3  . Methotrexate Sodium (METHOTREXATE PO) Inject 1 mg as directed every Wednesday.     . Omega-3 Fatty Acids (FISH OIL) 1000 MG CAPS Take by mouth 2 (two) times daily.    . primidone (MYSOLINE) 50 MG tablet Take 50 mg by mouth. 3 by mouth at bedtime    . ranitidine (ZANTAC) 75 MG tablet Take 75 mg by mouth 2 (two) times daily.    . rosuvastatin (CRESTOR) 10 MG tablet Take 1 tablet (10 mg total) by mouth daily. 90 tablet 3  . sertraline (ZOLOFT) 50 MG tablet 3 tabs po qd 90  tablet 3  . sitaGLIPtin (JANUVIA) 100 MG tablet Take 1 tablet (100 mg total) by mouth daily. 90 tablet 3  . triamcinolone (KENALOG) 0.025 % ointment Apply 1 application topically as needed.    . vitamin E (VITAMIN E) 400 UNIT capsule Take 400 Units by mouth daily.     No current facility-administered medications for this visit.      Past Surgical History:  Procedure Laterality Date  . ABDOMINAL HYSTERECTOMY  1991   for fibroids; ovaries still in  . BREAST BIOPSY  2001; 02/12/16   fibrocystic breast disease in 2001 and again in 02/2016 (+scar from prev bx site w/calcifications).  . COLONOSCOPY  09/12/2013   Recall 5 yrs ([facility], Dr. Evette Cristal) due to FH of colon polyps.  Manfred Shirts   for chronic UTI  . DEXA  10/2016   Bone density normal (T-score 0.0)  . ESOPHAGOGASTRODUODENOSCOPY  04/19/2011   Nl except antral gastritis: bx = mild chron gastritis (H pyloria neg)   . intraarticular steroid injection  2013   3 R knee & L X 1; Dr Netta Corrigan  . KNEE ARTHROCENTESIS  2013   R knee x 3 & L X 1  . KNEE ARTHROSCOPY  10/21/2011   Procedure: ARTHROSCOPY KNEE;  Surgeon: Jacki Cones, MD;  Location: [facility];  Service: Orthopedics;  Laterality: Right;  WITH MEDIAL and lateral shaving of femoral chondyl  with microfracture technique of lateral and medial femoral chondyl suprapatellar synovectomy  . TOTAL KNEE ARTHROPLASTY  04/12/2012   Procedure: TOTAL KNEE ARTHROPLASTY;  Surgeon: Jacki Cones, MD;  Location: [facility];  Service: Orthopedics;  Laterality: Right;  Right Total Knee Arthroplasty  . TUBAL LIGATION  1981     Allergies  Allergen Reactions  . Penicillins Rash  . Pravastatin     Myalgias  . Simvastatin     Myalgias  . Hydrocodone-Acetaminophen Nausea And Vomiting      Family History  Problem Relation Age of Onset  . Diabetes Mother   . Stroke Mother 51  . Osteoarthritis Mother   . Stroke Brother 17  . Heart disease Father        CABG  . Lung  cancer Father   . Prostate cancer Father   . Pancreatic cancer Father   . Pancreatic cancer Paternal Grandfather   . Bipolar disorder Daughter   . Anxiety disorder Son   . Arthritis Maternal Grandmother        rheumatoid  . Heart attack Brother 4       smoker     Social  History Ms. Mairena reports that she has never smoked. She has never used smokeless tobacco. Ms. Shaft reports that she does not drink alcohol.   Review of Systems CONSTITUTIONAL: No weight loss, fever, chills, weakness or fatigue.  HEENT: Eyes: No visual loss, blurred vision, double vision or yellow sclerae.No hearing loss, sneezing, congestion, runny nose or sore throat.  SKIN: No rash or itching.  CARDIOVASCULAR: per hpi RESPIRATORY: per hpi GASTROINTESTINAL: No anorexia, nausea, vomiting or diarrhea. No abdominal pain or blood.  GENITOURINARY: No burning on urination, no polyuria NEUROLOGICAL: No headache, dizziness, syncope, paralysis, ataxia, numbness or tingling in the extremities. No change in bowel or bladder control.  MUSCULOSKELETAL: No muscle, back pain, joint pain or stiffness.  LYMPHATICS: No enlarged nodes. No history of splenectomy.  PSYCHIATRIC: No history of depression or anxiety.  ENDOCRINOLOGIC: No reports of sweating, cold or heat intolerance. No polyuria or polydipsia.  Marland Kitchen   Physical Examination Vitals:   10/20/17 1307  BP: 127/82  Pulse: 78  SpO2: 97%   Vitals:   10/20/17 1307  Weight: 208 lb (94.3 kg)  Height: 5\' 8"  (1.727 m)    Gen: resting comfortably, no acute distress HEENT: no scleral icterus, pupils equal round and reactive, no palptable cervical adenopathy,  CV: RRR, no m/r/g, no jvd Resp: Clear to auscultation bilaterally GI: abdomen is soft, non-tender, non-distended, normal bowel sounds, no hepatosplenomegaly MSK: extremities are warm, no edema.  Skin: warm, no rash Neuro:  no focal deficits Psych: appropriate affect     Assessment and Plan  1. Chest  pain - CAD risk factors along with noted CAD by recent CT scan - we will plan for lexiscan stress test to further evaluate  2. Palpitatoins -plan for 2 week event monitor to further evaluate   F/u pending test results.        , M.D.,

## 2017-10-20 NOTE — Telephone Encounter (Signed)
Pre-cert Verification for the following procedure   Lexiscan scheduled for 11/01/2017 at [facility]

## 2017-10-27 ENCOUNTER — Encounter: Payer: Self-pay | Admitting: Cardiology

## 2017-11-01 ENCOUNTER — Encounter (HOSPITAL_COMMUNITY): Payer: PRIVATE HEALTH INSURANCE

## 2017-11-01 ENCOUNTER — Encounter (HOSPITAL_COMMUNITY)
Admission: RE | Admit: 2017-11-01 | Discharge: 2017-11-01 | Disposition: A | Discharge status: Home / Self Care | Payer: PRIVATE HEALTH INSURANCE | Source: Ambulatory Visit | Attending: Cardiology | Admitting: Cardiology

## 2017-11-01 DIAGNOSIS — R079 Chest pain, unspecified: Secondary | ICD-10-CM | POA: Insufficient documentation

## 2017-11-01 HISTORY — PX: CARDIOVASCULAR STRESS TEST: SHX262

## 2017-11-01 LAB — NM MYOCAR MULTI W/SPECT W/WALL MOTION / EF
CHL CUP NUCLEAR SSS: 1
CHL CUP RESTING HR STRESS: 69 {beats}/min
LV sys vol: 14 mL
LVDIAVOL: 63 mL (ref 46–106)
NUC STRESS TID: 1.09
Peak HR: 96 {beats}/min
RATE: 0.41
SDS: 1
SRS: 0

## 2017-11-01 MED ORDER — SODIUM CHLORIDE 0.9% FLUSH
INTRAVENOUS | Status: AC
  Administered 2017-11-01: 10 mL via INTRAVENOUS
  Filled 2017-11-01: qty 10

## 2017-11-01 MED ORDER — TECHNETIUM TC 99M TETROFOSMIN IV KIT
30.0000 | PACK | Freq: Once | INTRAVENOUS | Status: AC | PRN
  Administered 2017-11-01: 30 via INTRAVENOUS

## 2017-11-01 MED ORDER — REGADENOSON 0.4 MG/5ML IV SOLN
INTRAVENOUS | Status: AC
  Administered 2017-11-01: 0.4 mg via INTRAVENOUS
  Filled 2017-11-01: qty 5

## 2017-11-01 MED ORDER — TECHNETIUM TC 99M TETROFOSMIN IV KIT
10.0000 | PACK | Freq: Once | INTRAVENOUS | Status: AC | PRN
  Administered 2017-11-01: 9.6 via INTRAVENOUS

## 2017-11-02 ENCOUNTER — Telehealth: Payer: Self-pay | Admitting: *Deleted

## 2017-11-02 NOTE — Telephone Encounter (Signed)
Pt aware and voiced understanding - routed to pcp  

## 2017-11-02 NOTE — Telephone Encounter (Signed)
-----   Message from Antoine Poche, MD sent at 11/02/2017  2:24 PM EDT ----- Stress test looks good, no evidence of blockages. We will f/u results of her heart monitor   J BrancH MD

## 2017-11-03 ENCOUNTER — Encounter: Payer: Self-pay | Admitting: Family Medicine

## 2017-11-07 ENCOUNTER — Ambulatory Visit (INDEPENDENT_AMBULATORY_CARE_PROVIDER_SITE_OTHER): Payer: PRIVATE HEALTH INSURANCE | Admitting: *Deleted

## 2017-11-07 DIAGNOSIS — R002 Palpitations: Secondary | ICD-10-CM

## 2017-11-11 DIAGNOSIS — K8020 Calculus of gallbladder without cholecystitis without obstruction: Secondary | ICD-10-CM | POA: Insufficient documentation

## 2017-11-29 NOTE — Progress Notes (Signed)
Event monitor baseline

## 2017-12-12 ENCOUNTER — Telehealth: Payer: Self-pay | Admitting: *Deleted

## 2017-12-12 NOTE — Telephone Encounter (Signed)
-----   Message from Antoine Poche, MD sent at 12/07/2017  4:16 PM EDT ----- Heart monitor shows some occasional extra heart beats, some times she can have a few in a row. This is considered benign but can cause some palpitations. If still bothering her can try lopressor 12.5mg  bid, if symptoms aren't bothersome not something we have to treat. F/u 2 months  Dominga Ferry MD

## 2017-12-12 NOTE — Telephone Encounter (Signed)
Pt aware and voiced understanding - says palps are not bothersome but on occasion and declines metoprolol at this time. 2 month f/u scheduled - routed to pcp

## 2017-12-15 ENCOUNTER — Encounter: Payer: Self-pay | Admitting: Family Medicine

## 2017-12-16 ENCOUNTER — Ambulatory Visit: Payer: PRIVATE HEALTH INSURANCE | Admitting: Internal Medicine

## 2017-12-16 ENCOUNTER — Encounter: Payer: Self-pay | Admitting: Internal Medicine

## 2017-12-16 VITALS — BP 116/80 | HR 77 | Ht 68.0 in | Wt 206.0 lb

## 2017-12-16 DIAGNOSIS — Z794 Long term (current) use of insulin: Secondary | ICD-10-CM

## 2017-12-16 DIAGNOSIS — E785 Hyperlipidemia, unspecified: Secondary | ICD-10-CM | POA: Diagnosis not present

## 2017-12-16 DIAGNOSIS — E119 Type 2 diabetes mellitus without complications: Secondary | ICD-10-CM

## 2017-12-16 LAB — POCT GLYCOSYLATED HEMOGLOBIN (HGB A1C): Hemoglobin A1C: 7 % — AB (ref 4.0–5.6)

## 2017-12-16 MED ORDER — FREESTYLE LIBRE 14 DAY SENSOR MISC: 1.0000 | 11 refills | Status: DC

## 2017-12-16 MED ORDER — FREESTYLE LIBRE 14 DAY READER DEVI: 1.0000 | Freq: Once | 1 refills | Status: AC

## 2017-12-16 NOTE — Addendum Note (Signed)
Addended by: Yolande Jolly on: 12/16/2017 01:15 PM   Modules accepted: Orders

## 2017-12-16 NOTE — Progress Notes (Signed)
Patient ID: Raven [name], female   DOB: 1955/09/19, 62 y.o.   MRN: [id number]  HPI: Raven [name] is a 62 y.o.-year-old female, returning for f/u for DM2, dx ~1995, insulin-dependent since 11/2013, controlled, without long-term complications. Last visit 4 months ago.  She was in the emergency room for atypical chest pain 09/2017 right after she came back from [place]. She r/o AMI and PE. She had a Holter monitor, had a stress test. Occasional irregular beats, but no tx needed. It was believed that the pain was related to overactive GB. She will have cholecystectomy.  She continues on prednisone 5 mg daily for her RA.  She started the whole 30 diet on 08/12/2017.  At last visit, sugars were better and she already started to lose weight. Since the last visit, she is off the diet, eating more sweets, but planning to restart along with her husband.  Last hemoglobin A1c was: Lab Results  Component Value Date   HGBA1C 6.8 08/19/2017   HGBA1C 6.1 04/19/2017   HGBA1C 6.0 12/17/2016   She has RA >> was on Simponi (changed from Remicade) and MTX >> started Orencia and stays on MTX.  Plaquenil..  Pt is on a regimen of: - Levemir 14 units at bedtime. - Metformin ER 1500 mg daily - Januvia 100 mg in a.m. She tried Glimepiride in the past >> fluctuating blood sugar.  In the past, she was checking her sugars with her CGM, several times a day, but she cannot afford it anymore, as the price increased from $70 to $130 a month.  P no sugars available for review at this visit, but previously,  CGM traces-25-75%: - am:  90s-110 >> 120s >> 90-110 - 2h after b'fast: 160-180 >> 120-130 - before lunch: n/c >> 110-120 - 2h after lunch: n/c >> 140-154 - before dinner: n/c >> 130-150 - 2h after dinner: 160-180 >> n/c - bedtime: 110-120 >> n/c  - nighttime: n/c >> 100-150 Lowest sugar was 90s in am >> 72 >> 80  she has hypoglycemia awareness in the 70s. Highest sugar was 180 >>174 >> 278  (dessert).  Meter: ReliOn.  Pt's meals are: - Breakfast: scrambled eggs + bacon + tomatoes - Lunch: meat + 2 veggies + no bread unless burger - Dinner: meat + 2 veggies - Snacks: 2: nuts; cheese;  + more sweets recently.  -No CKD, last BUN/creatinine:  Lab Results  Component Value Date   BUN 10 10/14/2017   CREATININE 0.82 10/14/2017  On lisinopril. -+ HL; last set of lipids: Lab Results  Component Value Date   CHOL 158 08/17/2017   HDL 40.80 08/17/2017   LDLCALC 95 08/17/2017   LDLDIRECT 142.3 03/06/2008   TRIG 110.0 08/17/2017   CHOLHDL 4 08/17/2017  She was on statins but taken off b/c transaminitis in 10/2013. She was on Atorvastatin >> mm cramps.  On fish oil and Crestor 10 mg daily.  No muscle aches. - last eye exam -04/2017: No DR.+  cataract. - no numbness and tingling in her feet.  She went on a [place] cruise after our last visit.  ROS: Constitutional: no weight gain/no weight loss, + fatigue, no subjective hyperthermia, no subjective hypothermia Eyes: + blurry vision-right eye, no xerophthalmia ENT: no sore throat, no nodules palpated in throat, no dysphagia, no odynophagia, no hoarseness Cardiovascular: no CP/no SOB/no palpitations/no leg swelling Respiratory: no cough/no SOB/no wheezing Gastrointestinal: + N/no V/no D/no C/+ acid reflux Musculoskeletal: no muscle aches/+ joint aches (pseudogout) Skin: no  rashes, + hair loss Neurological: no tremors/no numbness/no tingling/no dizziness  I reviewed pt's medications, allergies, PMH, social hx, family hx, and changes were documented in the history of present illness. Otherwise, unchanged from my initial visit note. Increased Zoloft. She started Colchicine for pseudogout.  Past Medical History:  Diagnosis Date  . Acute medial meniscus tear of right knee   . Anemia of chronic disease   . Anxiety and depression   . Chest pain 10/2017   +Cardiac CT.  Myocardial perfusion imaging NORMAL, EF>65%  . Diabetes  mellitus 1995   Managed by Dr. Elvera Lennox (Endo)  . GERD (gastroesophageal reflux disease) per pt watches diet and take tums as needed  . H/O hiatal hernia    slightly larger ("moderate" size) on CT angio done to r/o PE 10/2017.  Marland Kitchen Hyperlipidemia   . Hypertension   . Interstitial cystitis   . Lichen sclerosus of female genitalia   . Migraine   . Osteoarthritis   . Palpitations 10/2017   3 wk event monitor: symptoms correlate with sinus rhythm with PACs, at times runs of PACs up to 5 beats.  . Psoriasis   . Seasonal allergies   . Seropositive rheumatoid arthritis (HCC)    Hands, knees, jaw, elbow: responding to Simponi as of 09/29/15 rheum f/u.  Waning effectiveness on 03/2016 f/u so pt switched to Orencia injections.  Chronic low-dose prednisone therapy+ methotrexate.  . Symptomatic cholelithiasis 10/2017    Past Surgical History:  Procedure Laterality Date  . ABDOMINAL HYSTERECTOMY  1991   for fibroids; ovaries still in  . BREAST BIOPSY  2001; 02/12/16   fibrocystic breast disease in 2001 and again in 02/2016 (+scar from prev bx site w/calcifications).  . CARDIOVASCULAR STRESS TEST  11/01/2017   Myocard perf imaging: NORMAL (EF >60-65%)  . COLONOSCOPY  09/12/2013   Recall 5 yrs ([facility], Dr. Evette Cristal) due to FH of colon polyps.  Manfred Shirts   for chronic UTI  . DEXA  10/2016   Bone density normal (T-score 0.0)  . ESOPHAGOGASTRODUODENOSCOPY  04/19/2011   Nl except antral gastritis: bx = mild chron gastritis (H pyloria neg)   . EVENT MONITOR  10/2017   3 wk-->Symptoms correlate with sinus rhythm with PACs, at times runs of PACs up to 5 beats.  . intraarticular steroid injection  2013   3 R knee & L X 1; Dr Netta Corrigan  . KNEE ARTHROCENTESIS  2013   R knee x 3 & L X 1  . KNEE ARTHROSCOPY  10/21/2011   Procedure: ARTHROSCOPY KNEE;  Surgeon: Jacki Cones, MD;  Location: [facility];  Service: Orthopedics;  Laterality: Right;  WITH MEDIAL and lateral shaving of femoral  chondyl  with microfracture technique of lateral and medial femoral chondyl suprapatellar synovectomy  . TOTAL KNEE ARTHROPLASTY  04/12/2012   Procedure: TOTAL KNEE ARTHROPLASTY;  Surgeon: Jacki Cones, MD;  Location: [facility];  Service: Orthopedics;  Laterality: Right;  Right Total Knee Arthroplasty  . TUBAL LIGATION  1981    Social History   Socioeconomic History  . Marital status: Married    Spouse name: Not on file  . Number of children: Not on file  . Years of education: Not on file  . Highest education level: Not on file  Occupational History  . Not on file  Social Needs  . Financial resource strain: Not on file  . Food insecurity:    Worry: Not on file    Inability:  Not on file  . Transportation needs:    Medical: Not on file    Non-medical: Not on file  Tobacco Use  . Smoking status: Never Smoker  . Smokeless tobacco: Never Used  Substance and Sexual Activity  . Alcohol use: No  . Drug use: No  . Sexual activity: Not on file  Lifestyle  . Physical activity:    Days per week: Not on file    Minutes per session: Not on file  . Stress: Not on file  Relationships  . Social connections:    Talks on phone: Not on file    Gets together: Not on file    Attends religious service: Not on file    Active member of club or organization: Not on file    Attends meetings of clubs or organizations: Not on file    Relationship status: Not on file  . Intimate partner violence:    Fear of current or ex partner: Not on file    Emotionally abused: Not on file    Physically abused: Not on file    Forced sexual activity: Not on file  Other Topics Concern  . Not on file  Social History Narrative   Married, 3 children (2 in [place], 1 in [place]).  4 grandchildren.   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)   No tob/alc/drugs.   2 cups of coffee/day x 3 months   Regular exercise- no-due to arthritis.   Religion affecting care, "it allows stress management:"     Current Outpatient Medications on File Prior to Visit  Medication Sig Dispense Refill  . Abatacept (ORENCIA) 50 MG/0.4ML SOSY Inject 1 Dose into the skin once a week.    . cetirizine (ZYRTEC) 10 MG tablet Take 10 mg by mouth every evening.     . Continuous Blood Gluc Receiver (FREESTYLE LIBRE READER) DEVI 1 Device by Does not apply route 3 (three) times daily. 1 Device 1  . Continuous Blood Gluc Sensor (FREESTYLE LIBRE SENSOR SYSTEM) MISC 1 Device by Does not apply route every 30 (thirty) days. 3 each 11  . cyclobenzaprine (FLEXERIL) 5 MG tablet Take 5 mg by mouth 3 (three) times daily as needed for muscle spasms. Take 1 1/2 tablets 3 times daily    . diazepam (VALIUM) 5 MG tablet Take 1 tablet (5 mg total) by mouth every 12 (twelve) hours as needed. Migraine headaches 60 tablet 5  . FOLIC ACID PO Take 1 mg by mouth daily.     . Insulin Detemir (LEVEMIR FLEXTOUCH) 100 UNIT/ML Pen INJECT 14 UNITS INTO THE SKIN DAILY AT 10PM 15 mL 11  . lisinopril (PRINIVIL,ZESTRIL) 20 MG tablet Take 0.5 tablets (10 mg total) by mouth daily. 45 tablet 3  . metFORMIN (GLUCOPHAGE-XR) 500 MG 24 hr tablet Take 2 tablets (1,000 mg total) by mouth 2 (two) times daily with a meal. 360 tablet 3  . Methotrexate Sodium (METHOTREXATE PO) Inject 1 mg as directed every Wednesday.     . Omega-3 Fatty Acids (FISH OIL) 1000 MG CAPS Take by mouth 2 (two) times daily.    . primidone (MYSOLINE) 50 MG tablet Take 50 mg by mouth. 3 by mouth at bedtime    . ranitidine (ZANTAC) 75 MG tablet Take 75 mg by mouth 2 (two) times daily.    . rosuvastatin (CRESTOR) 10 MG tablet Take 1 tablet (10 mg total) by mouth daily. 90 tablet 3  . sertraline (ZOLOFT) 50 MG tablet 3 tabs po qd 90 tablet 3  .  sitaGLIPtin (JANUVIA) 100 MG tablet Take 1 tablet (100 mg total) by mouth daily. 90 tablet 3  . triamcinolone (KENALOG) 0.025 % ointment Apply 1 application topically as needed.    . vitamin E (VITAMIN E) 400 UNIT capsule Take 400 Units by mouth  daily.     No current facility-administered medications on file prior to visit.     Allergies  Allergen Reactions  . Penicillins Rash  . Pravastatin     Myalgias  . Simvastatin     Myalgias  . Hydrocodone-Acetaminophen Nausea And Vomiting    Family History  Problem Relation Age of Onset  . Diabetes Mother   . Stroke Mother 77  . Osteoarthritis Mother   . Stroke Brother 22  . Heart disease Father        CABG  . Lung cancer Father   . Prostate cancer Father   . Pancreatic cancer Father   . Pancreatic cancer Paternal Grandfather   . Bipolar disorder Daughter   . Anxiety disorder Son   . Arthritis Maternal Grandmother        rheumatoid  . Heart attack Brother 43       smoker    PE: BP 116/80   Pulse 77   Ht 5\' 8"  (1.727 m)   Wt 206 lb (93.4 kg)   SpO2 97%   BMI 31.32 kg/m  Body mass index is 31.32 kg/m.  Wt Readings from Last 3 Encounters:  12/16/17 206 lb (93.4 kg)  10/20/17 208 lb (94.3 kg)  10/14/17 207 lb 6.4 oz (94.1 kg)   Constitutional: overweight, in NAD Eyes: PERRLA, EOMI, no exophthalmos ENT: moist mucous membranes, no thyromegaly, no cervical lymphadenopathy Cardiovascular: RRR, No MRG Respiratory: CTA B Gastrointestinal: abdomen soft, NT, ND, BS+ Musculoskeletal: no deformities, strength intact in all 4 Skin: moist, warm, no rashes Neurological: + tremor with outstretched hands, DTR normal in all 4   ASSESSMENT: 1. DM2, insulin-dependent, controlled, without long term complications, but with Hgly  2. HL  PLAN:  1. Patient with long-standing, previously uncontrolled diabetes, now with better control on low-dose basal insulin + metformin and Januvia.  We switched from regular metformin to extended release as she was having diarrhea with the regular formulation.  Her diarrhea has improved, but not completely resolved.  She is now taking 500 mg 2-3 times a day, depending on her symptoms.  She was on the [employer] CGM.  At last visit, sugars  greatly improved after she started the whole 30 diet.  We did not change her medication regimen.  At this visit, she is off the CGM since she cannot afford it anymore.  We will try to obtain it for her. - At this visit, she relaxed her diet even more, including more sweets, and coming off the whole 30 diet.  Her sugars have increased, especially after dietary indulgences.  However, she and her husband are planning to go back to the whole 30 diet, on which she was doing great. - In this case, will not change her regimen for now - I suggested to:  Patient Instructions  Please continue: - Levemir 14 units at bedtime. - Metformin ER 1500 mg daily - Januvia 100 mg in a.m.  Please come back for a follow-up appointment in 4 months.  - today, HbA1c is 7% (higher) -Restart checking sugars at different times of the day with CGM  - advised for yearly eye exams >> she is UTD - Return to clinic in 4 mo with  sugar log     2. HL - Reviewed latest lipid panel from 08/2017 >> Lipid fractions improved, esp. Her LDL - Continues Crestor without side effects. Also on Fish oil.  Carlus Pavlov, MD PhD [facility]

## 2017-12-16 NOTE — Patient Instructions (Signed)
Please continue: - Levemir 14 units at bedtime. - Metformin ER 1500 mg daily - Januvia 100 mg in a.m.  Please come back for a follow-up appointment in 4 months. 

## 2018-01-08 ENCOUNTER — Encounter: Payer: Self-pay | Admitting: Family Medicine

## 2018-01-09 HISTORY — PX: LAPAROSCOPIC CHOLECYSTECTOMY: SUR755

## 2018-01-19 ENCOUNTER — Other Ambulatory Visit: Payer: Self-pay | Admitting: Internal Medicine

## 2018-02-16 ENCOUNTER — Other Ambulatory Visit: Payer: Self-pay | Admitting: Internal Medicine

## 2018-02-21 ENCOUNTER — Ambulatory Visit: Payer: PRIVATE HEALTH INSURANCE | Admitting: Cardiology

## 2018-02-23 ENCOUNTER — Ambulatory Visit: Payer: PRIVATE HEALTH INSURANCE | Admitting: Family Medicine

## 2018-02-24 ENCOUNTER — Encounter: Payer: Self-pay | Admitting: *Deleted

## 2018-02-24 ENCOUNTER — Encounter: Payer: Self-pay | Admitting: Family Medicine

## 2018-02-24 ENCOUNTER — Ambulatory Visit: Payer: PRIVATE HEALTH INSURANCE | Admitting: Family Medicine

## 2018-02-24 VITALS — BP 101/69 | HR 75 | Temp 98.3°F | Resp 16 | Ht 68.0 in | Wt 204.0 lb

## 2018-02-24 DIAGNOSIS — E7800 Pure hypercholesterolemia, unspecified: Secondary | ICD-10-CM

## 2018-02-24 DIAGNOSIS — I10 Essential (primary) hypertension: Secondary | ICD-10-CM | POA: Diagnosis not present

## 2018-02-24 DIAGNOSIS — Z23 Encounter for immunization: Secondary | ICD-10-CM | POA: Diagnosis not present

## 2018-02-24 DIAGNOSIS — F3342 Major depressive disorder, recurrent, in full remission: Secondary | ICD-10-CM

## 2018-02-24 DIAGNOSIS — F411 Generalized anxiety disorder: Secondary | ICD-10-CM | POA: Diagnosis not present

## 2018-02-24 LAB — LIPID PANEL
CHOLESTEROL: 148 mg/dL (ref 0–200)
HDL: 48.2 mg/dL (ref >39.00)
LDL CALC: 68 mg/dL (ref 0–99)
NONHDL: 99.8
Total CHOL/HDL Ratio: 3
Triglycerides: 159 mg/dL — ABNORMAL HIGH (ref 0.0–149.0)
VLDL: 31.8 mg/dL (ref 0.0–40.0)

## 2018-02-24 LAB — BASIC METABOLIC PANEL
BUN: 13 mg/dL (ref 6–23)
CALCIUM: 8.9 mg/dL (ref 8.4–10.5)
CO2: 26 meq/L (ref 19–32)
Chloride: 105 mEq/L (ref 96–112)
Creatinine, Ser: 0.8 mg/dL (ref 0.40–1.20)
GFR: 77.08 mL/min (ref >60.00)
Glucose, Bld: 110 mg/dL — ABNORMAL HIGH (ref 70–99)
POTASSIUM: 4.2 meq/L (ref 3.5–5.1)
Sodium: 141 mEq/L (ref 135–145)

## 2018-02-24 MED ORDER — DIAZEPAM 5 MG PO TABS: 5.0000 mg | ORAL_TABLET | Freq: Two times a day (BID) | ORAL | 5 refills | Status: DC | PRN

## 2018-02-24 NOTE — Progress Notes (Signed)
OFFICE VISIT  02/24/2018   CC:  Chief Complaint  Patient presents with  . Follow-up    RCI, pt is fasting.      HPI:    Patient is a 62 y.o. Caucasian female who presents for f/u HTN, HLD, recurrent MDD. She has DM 2, managed by Dr. Oswaldo Conroy. Has seropos RA managed by rheum.  Feels great since getting her GB removed a few months ago.  MDD: At last f/u 10/2017 I increased her sertraline to 150mg  qd.  She is much improved, pt feels like she is in remission. GAD: she uses diazepam on infrequent basis and finds it very helpful.  CSC discussed/done today. HTN: home monitoring consistently <130/80. HLD: tolerating daily crestor.  ROS: no CP, no SOB, no wheezing, no cough, no dizziness, no HAs, no rashes, no melena/hematochezia.  No polyuria or polydipsia.     Past Medical History:  Diagnosis Date  . Acute medial meniscus tear of right knee   . Anemia of chronic disease    Mild, Hb stable consistently  . Anxiety and depression   . Chest pain 10/2017   +Cardiac CT.  Myocardial perfusion imaging NORMAL, EF>65%  . Diabetes mellitus 1995   Managed by Dr. 11/2017 (Endo)  . GERD (gastroesophageal reflux disease) per pt watches diet and take tums as needed  . H/O hiatal hernia    slightly larger ("moderate" size) on CT angio done to r/o PE 10/2017.  11/2017 Hyperlipidemia   . Hypertension   . Interstitial cystitis   . Lichen sclerosus of female genitalia   . Migraine   . Osteoarthritis   . Palpitations 10/2017   3 wk event monitor: symptoms correlate with sinus rhythm with PACs, at times runs of PACs up to 5 beats.  . Pseudogout of knee, left    colchicine helpful  . Psoriasis   . Seasonal allergies   . Seropositive rheumatoid arthritis (HCC)    Hands, knees, jaw, elbow: responding to Simponi as of 09/29/15 rheum f/u.  Waning effectiveness on 03/2016 f/u so pt switched to Orencia injections.  Chronic low-dose prednisone therapy+ methotrexate.  . Symptomatic cholelithiasis 10/2017    Lap chole summer 2019    Past Surgical History:  Procedure Laterality Date  . ABDOMINAL HYSTERECTOMY  1991   for fibroids; ovaries still in  . BREAST BIOPSY  2001; 02/12/16   fibrocystic breast disease in 2001 and again in 02/2016 (+scar from prev bx site w/calcifications).  . CARDIOVASCULAR STRESS TEST  11/01/2017   Myocard perf imaging: NORMAL (EF >60-65%)  . COLONOSCOPY  09/12/2013   Recall 5 yrs ([facility], Dr. 11/12/2013) due to FH of colon polyps.  Evette Cristal   for chronic UTI  . DEXA  10/2016   Bone density normal (T-score 0.0)  . ESOPHAGOGASTRODUODENOSCOPY  04/19/2011   Nl except antral gastritis: bx = mild chron gastritis (H pyloria neg)   . EVENT MONITOR  10/2017   3 wk-->Symptoms correlate with sinus rhythm with PACs, at times runs of PACs up to 5 beats.  . intraarticular steroid injection  2013   3 R knee & L X 1; Dr 2014  . KNEE ARTHROCENTESIS  2013   R knee x 3 & L X 1  . KNEE ARTHROSCOPY  10/21/2011   Procedure: ARTHROSCOPY KNEE;  Surgeon: 12/21/2011, MD;  Location: [facility];  Service: Orthopedics;  Laterality: Right;  WITH MEDIAL and lateral shaving of femoral chondyl  with microfracture technique of  lateral and medial femoral chondyl suprapatellar synovectomy  . LAPAROSCOPIC CHOLECYSTECTOMY  01/2018  . TOTAL KNEE ARTHROPLASTY  04/12/2012   Procedure: TOTAL KNEE ARTHROPLASTY;  Surgeon: Jacki Cones, MD;  Location: [facility];  Service: Orthopedics;  Laterality: Right;  Right Total Knee Arthroplasty  . TUBAL LIGATION  1981    Outpatient Medications Prior to Visit  Medication Sig Dispense Refill  . Abatacept (ORENCIA) 50 MG/0.4ML SOSY Inject 1 Dose into the skin once a week.    . cetirizine (ZYRTEC) 10 MG tablet Take 10 mg by mouth every evening.     . colchicine 0.6 MG tablet Take 1 tablet by mouth 2 (two) times daily.    . Continuous Blood Gluc Sensor (FREESTYLE LIBRE 14 DAY SENSOR) MISC 1 each by Does not apply route every 14  (fourteen) days. Change every 2 weeks 2 each 11  . cyclobenzaprine (FLEXERIL) 5 MG tablet Take 5 mg by mouth 3 (three) times daily as needed for muscle spasms. Take 1 1/2 tablets 3 times daily    . FOLIC ACID PO Take 1 mg by mouth daily.     . Insulin Detemir (LEVEMIR FLEXTOUCH) 100 UNIT/ML Pen INJECT 14 UNITS INTO THE SKIN DAILY AT 10PM 15 mL 11  . JANUVIA 100 MG tablet TAKE ONE TABLET BY MOUTH EVERY DAY 30 tablet 0  . lisinopril (PRINIVIL,ZESTRIL) 20 MG tablet Take 0.5 tablets (10 mg total) by mouth daily. 45 tablet 3  . metFORMIN (GLUCOPHAGE-XR) 500 MG 24 hr tablet Take 2 tablets (1,000 mg total) by mouth 2 (two) times daily with a meal. 360 tablet 3  . Methotrexate Sodium (METHOTREXATE PO) Inject 1 mg as directed every Wednesday.     . Omega-3 Fatty Acids (FISH OIL) 1000 MG CAPS Take by mouth 2 (two) times daily.    . primidone (MYSOLINE) 50 MG tablet Take 50 mg by mouth. 3 by mouth at bedtime    . ranitidine (ZANTAC) 75 MG tablet Take 75 mg by mouth 2 (two) times daily.    . rosuvastatin (CRESTOR) 10 MG tablet Take 1 tablet (10 mg total) by mouth daily. 90 tablet 3  . sertraline (ZOLOFT) 50 MG tablet 3 tabs po qd 90 tablet 3  . triamcinolone (KENALOG) 0.025 % ointment Apply 1 application topically as needed.    . vitamin E (VITAMIN E) 400 UNIT capsule Take 400 Units by mouth daily.    . diazepam (VALIUM) 5 MG tablet Take 1 tablet (5 mg total) by mouth every 12 (twelve) hours as needed. Migraine headaches 60 tablet 5  . metFORMIN (GLUCOPHAGE-XR) 500 MG 24 hr tablet TAKE TWO TABLETS BY MOUTH TWICE DAILY WITH MEALS (Patient not taking: Reported on 02/24/2018) 360 tablet 3   No facility-administered medications prior to visit.     Allergies  Allergen Reactions  . Penicillins Rash  . Pravastatin     Myalgias  . Simvastatin     Myalgias  . Hydrocodone-Acetaminophen Nausea And Vomiting    ROS As per HPI  PE: Blood pressure 101/69, pulse 75, temperature 98.3 F (36.8 C), temperature  source Oral, resp. rate 16, height 5\' 8"  (1.727 m), weight 204 lb (92.5 kg), SpO2 98 %. Gen: Alert, well appearing.  Patient is oriented to person, place, time, and situation. AFFECT: pleasant, lucid thought and speech. : no injection, icteris, swelling, or exudate.  EOMI, PERRLA. Mouth: lips without lesion/swelling.  Oral mucosa pink and moist. Oropharynx without erythema, exudate, or swelling.  CV: RRR, no m/r/g.  LUNGS: CTA bilat, nonlabored resps, good aeration in all lung fields. EXT: no clubbing, cyanosis, or edema.    LABS:  Lab Results  Component Value Date   TSH 1.96 08/17/2017   Lab Results  Component Value Date   WBC 7.8 10/05/2017   HGB 10.7 (L) 10/05/2017   HCT 33.6 (L) 10/05/2017   MCV 84.0 10/05/2017   PLT 243 10/05/2017  No results found for: IRON, TIBC, FERRITIN  Lab Results  Component Value Date   CREATININE 0.82 10/14/2017   BUN 10 10/14/2017   NA 138 10/14/2017   K 4.5 10/14/2017   CL 102 10/14/2017   CO2 27 10/14/2017   Lab Results  Component Value Date   ALT 20 10/14/2017   AST 21 10/14/2017   ALKPHOS 47 02/11/2017   BILITOT 0.3 10/14/2017   Lab Results  Component Value Date   CHOL 158 08/17/2017   Lab Results  Component Value Date   HDL 40.80 08/17/2017   Lab Results  Component Value Date   LDLCALC 95 08/17/2017   Lab Results  Component Value Date   TRIG 110.0 08/17/2017   Lab Results  Component Value Date   CHOLHDL 4 08/17/2017   Lab Results  Component Value Date   HGBA1C 7.0 (A) 12/16/2017    IMPRESSION AND PLAN:  Doing very well!  1) HTN: The current medical regimen is effective;  continue present plan and medications. Lytes/cr today.  2) HLD: tolerating statin.  FLP today.  3) MDD, in remission now.  Continue sertraline 150mg  qd.  4) GAD: sertraline 150 qd, with occ use of diazepam 10mg . Controlled substance contract reviewed with patient today.  Patient signed this and it will be placed in the chart.    Will do UDS next f/u visit. Diazepam eRx'd today.  5) Chronic diarrhea: resolved since cholecystectomy a few months ago.  6) Pseudogout: responding well to colchicine.  7) Preventative health care: Shingrix vaccine recommended today: #1 given today. Flu vaccine today.  An After Visit Summary was printed and given to the patient.  FOLLOW UP: Return in about 6 months (around 08/27/2018) for annual CPE (fasting).  Signed:  Santiago Bumpers, MD           02/24/2018

## 2018-04-07 ENCOUNTER — Ambulatory Visit: Payer: PRIVATE HEALTH INSURANCE | Admitting: Cardiology

## 2018-04-07 ENCOUNTER — Encounter: Payer: Self-pay | Admitting: Cardiology

## 2018-04-07 VITALS — BP 132/80 | HR 75 | Ht 68.0 in | Wt 208.8 lb

## 2018-04-07 DIAGNOSIS — R002 Palpitations: Secondary | ICD-10-CM

## 2018-04-07 DIAGNOSIS — R0789 Other chest pain: Secondary | ICD-10-CM | POA: Diagnosis not present

## 2018-04-07 NOTE — Progress Notes (Signed)
Clinical Summary Raven [name] is a 63 y.o.female seen today for follow up of the following medical problme.s .  1. Chest pain - seen in ER 10/05/17 with chest pain.  - Trop neg x 2, D-dimer0.98. CT PE neg for PE. Noted aortic atherosclerosis and CAD. +gallstone -EKG SR, no ischemic changes. CXR hiatal hernia, no acute rpcoess CAD risk factors: HL, DM2, brother history of MI age 20, father with MI she thinks late 23s, RA   - 10/2017 nuclear stress: no ischemia.  - occasional chest pain with certain foods. Otherwise no symptoms, Since our last visit she has had her gallbladder removed.   2. Palpitations - mild symptoms at times.  - 11/2017 event monitor: symptoms correlate with SR and PACs, short runs of PACs up to 5 beats - she was not interested in starting medical therapy.   3. Hyperlipidemia - 02/2018 TC 148 TG 159 HDL 48 LDL 68 - compliatn with statin    Past Medical History:  Diagnosis Date  . Acute medial meniscus tear of right knee   . Anemia of chronic disease    Mild, Hb stable consistently  . Anxiety and depression   . Chest pain 10/2017   +Cardiac CT.  Myocardial perfusion imaging NORMAL, EF>65%  . Diabetes mellitus 1995   Managed by Dr. Elvera Lennox (Endo)  . GERD (gastroesophageal reflux disease) per pt watches diet and take tums as needed  . H/O hiatal hernia    slightly larger ("moderate" size) on CT angio done to r/o PE 10/2017.  Marland Kitchen Hyperlipidemia   . Hypertension   . Interstitial cystitis   . Lichen sclerosus of female genitalia   . Migraine   . Osteoarthritis   . Palpitations 10/2017   3 wk event monitor: symptoms correlate with sinus rhythm with PACs, at times runs of PACs up to 5 beats.  . Pseudogout of knee, left    colchicine helpful  . Psoriasis   . Seasonal allergies   . Seropositive rheumatoid arthritis (HCC)    Hands, knees, jaw, elbow: responding to Simponi as of 09/29/15 rheum f/u.  Waning effectiveness on 03/2016 f/u so pt switched to  Orencia injections.  Chronic low-dose prednisone therapy+ methotrexate.  . Symptomatic cholelithiasis 10/2017   Lap chole summer 2019     Allergies  Allergen Reactions  . Penicillins Rash  . Pravastatin     Myalgias  . Simvastatin     Myalgias  . Hydrocodone-Acetaminophen Nausea And Vomiting     Current Outpatient Medications  Medication Sig Dispense Refill  . Abatacept (ORENCIA) 50 MG/0.4ML SOSY Inject 1 Dose into the skin once a week.    . cetirizine (ZYRTEC) 10 MG tablet Take 10 mg by mouth every evening.     . colchicine 0.6 MG tablet Take 1 tablet by mouth 2 (two) times daily.    . Continuous Blood Gluc Sensor (FREESTYLE LIBRE 14 DAY SENSOR) MISC 1 each by Does not apply route every 14 (fourteen) days. Change every 2 weeks 2 each 11  . cyclobenzaprine (FLEXERIL) 5 MG tablet Take 5 mg by mouth 3 (three) times daily as needed for muscle spasms. Take 1 1/2 tablets 3 times daily    . diazepam (VALIUM) 5 MG tablet Take 1 tablet (5 mg total) by mouth every 12 (twelve) hours as needed. Migraine headaches 60 tablet 5  . FOLIC ACID PO Take 1 mg by mouth daily.     . Insulin Detemir (LEVEMIR FLEXTOUCH) 100 UNIT/ML Pen INJECT  14 UNITS INTO THE SKIN DAILY AT 10PM 15 mL 11  . JANUVIA 100 MG tablet TAKE ONE TABLET BY MOUTH EVERY DAY 30 tablet 0  . lisinopril (PRINIVIL,ZESTRIL) 20 MG tablet Take 0.5 tablets (10 mg total) by mouth daily. 45 tablet 3  . metFORMIN (GLUCOPHAGE-XR) 500 MG 24 hr tablet Take 2 tablets (1,000 mg total) by mouth 2 (two) times daily with a meal. 360 tablet 3  . metFORMIN (GLUCOPHAGE-XR) 500 MG 24 hr tablet TAKE TWO TABLETS BY MOUTH TWICE DAILY WITH MEALS (Patient not taking: Reported on 02/24/2018) 360 tablet 3  . Methotrexate Sodium (METHOTREXATE PO) Inject 1 mg as directed every Wednesday.     . Omega-3 Fatty Acids (FISH OIL) 1000 MG CAPS Take by mouth 2 (two) times daily.    . primidone (MYSOLINE) 50 MG tablet Take 50 mg by mouth. 3 by mouth at bedtime    .  ranitidine (ZANTAC) 75 MG tablet Take 75 mg by mouth 2 (two) times daily.    . rosuvastatin (CRESTOR) 10 MG tablet Take 1 tablet (10 mg total) by mouth daily. 90 tablet 3  . sertraline (ZOLOFT) 50 MG tablet 3 tabs po qd 90 tablet 3  . triamcinolone (KENALOG) 0.025 % ointment Apply 1 application topically as needed.    . vitamin E (VITAMIN E) 400 UNIT capsule Take 400 Units by mouth daily.     No current facility-administered medications for this visit.      Past Surgical History:  Procedure Laterality Date  . ABDOMINAL HYSTERECTOMY  1991   for fibroids; ovaries still in  . BREAST BIOPSY  2001; 02/12/16   fibrocystic breast disease in 2001 and again in 02/2016 (+scar from prev bx site w/calcifications).  . CARDIOVASCULAR STRESS TEST  11/01/2017   Myocard perf imaging: NORMAL (EF >60-65%)  . COLONOSCOPY  09/12/2013   Recall 5 yrs ([facility], Dr. Evette Cristal) due to FH of colon polyps.  Manfred Shirts   for chronic UTI  . DEXA  10/2016   Bone density normal (T-score 0.0)  . ESOPHAGOGASTRODUODENOSCOPY  04/19/2011   Nl except antral gastritis: bx = mild chron gastritis (H pyloria neg)   . EVENT MONITOR  10/2017   3 wk-->Symptoms correlate with sinus rhythm with PACs, at times runs of PACs up to 5 beats.  . intraarticular steroid injection  2013   3 R knee & L X 1; Dr Netta Corrigan  . KNEE ARTHROCENTESIS  2013   R knee x 3 & L X 1  . KNEE ARTHROSCOPY  10/21/2011   Procedure: ARTHROSCOPY KNEE;  Surgeon: Jacki Cones, MD;  Location: [facility];  Service: Orthopedics;  Laterality: Right;  WITH MEDIAL and lateral shaving of femoral chondyl  with microfracture technique of lateral and medial femoral chondyl suprapatellar synovectomy  . LAPAROSCOPIC CHOLECYSTECTOMY  01/2018  . TOTAL KNEE ARTHROPLASTY  04/12/2012   Procedure: TOTAL KNEE ARTHROPLASTY;  Surgeon: Jacki Cones, MD;  Location: [facility];  Service: Orthopedics;  Laterality: Right;  Right Total Knee Arthroplasty  . TUBAL  LIGATION  1981     Allergies  Allergen Reactions  . Penicillins Rash  . Pravastatin     Myalgias  . Simvastatin     Myalgias  . Hydrocodone-Acetaminophen Nausea And Vomiting      Family History  Problem Relation Age of Onset  . Diabetes Mother   . Stroke Mother 82  . Osteoarthritis Mother   . Stroke Brother 62  . Heart disease  Father        CABG  . Lung cancer Father   . Prostate cancer Father   . Pancreatic cancer Father   . Pancreatic cancer Paternal Grandfather   . Bipolar disorder Daughter   . Anxiety disorder Son   . Arthritis Maternal Grandmother        rheumatoid  . Heart attack Brother 75       smoker     Social History Raven [name] reports that she has never smoked. She has never used smokeless tobacco. Raven [name] reports that she does not drink alcohol.   Review of Systems CONSTITUTIONAL: No weight loss, fever, chills, weakness or fatigue.  HEENT: Eyes: No visual loss, blurred vision, double vision or yellow sclerae.No hearing loss, sneezing, congestion, runny nose or sore throat.  SKIN: No rash or itching.  CARDIOVASCULAR: per hpi RESPIRATORY: No shortness of breath, cough or sputum.  GASTROINTESTINAL: No anorexia, nausea, vomiting or diarrhea. No abdominal pain or blood.  GENITOURINARY: No burning on urination, no polyuria NEUROLOGICAL: No headache, dizziness, syncope, paralysis, ataxia, numbness or tingling in the extremities. No change in bowel or bladder control.  MUSCULOSKELETAL: No muscle, back pain, joint pain or stiffness.  LYMPHATICS: No enlarged nodes. No history of splenectomy.  PSYCHIATRIC: No history of depression or anxiety.  ENDOCRINOLOGIC: No reports of sweating, cold or heat intolerance. No polyuria or polydipsia.  Marland Kitchen   Physical Examination Vitals:   04/07/18 1111  BP: 132/80  Pulse: 75   Vitals:   04/07/18 1111  Weight: 208 lb 12.8 oz (94.7 kg)  Height: 5\' 8"  (1.727 m)    Gen: resting comfortably, no acute  distress HEENT: no scleral icterus, pupils equal round and reactive, no palptable cervical adenopathy,  CV: RRR, no m/r/g, no jvd Resp: Clear to auscultation bilaterally GI: abdomen is soft, non-tender, non-distended, normal bowel sounds, no hepatosplenomegaly MSK: extremities are warm, no edema.  Skin: warm, no rash Neuro:  no focal deficits Psych: appropriate affect   Diagnostic Studies  10/2017 nuclear stress  There was no ST segment deviation noted during stress.  The study is normal. There are no perfusion defects  This is a low risk study.  The left ventricular ejection fraction is hyperdynamic (>65%).   Assessment and Plan   1. Chest pain - no recent cardiac symptoms. Recent negative stress test, no further testing at this time - continue risk factor modification.   2. Palpitatoins -event monitor with benign PACs - she is not interested in trial of beta blocker at this time, symptoms mild and tolerable - continue to monitor   F/u as needed     11/2017, M.D

## 2018-04-07 NOTE — Patient Instructions (Signed)
Your physician recommends that you schedule a follow-up appointment in: AS NEEDED WITH DR BRANCH  Your physician recommends that you continue on your current medications as directed. Please refer to the Current Medication list given to you today.  Thank you for choosing [facility]!!    

## 2018-04-18 ENCOUNTER — Ambulatory Visit: Payer: PRIVATE HEALTH INSURANCE | Admitting: Internal Medicine

## 2018-04-26 ENCOUNTER — Other Ambulatory Visit: Payer: Self-pay | Admitting: Family Medicine

## 2018-06-02 ENCOUNTER — Ambulatory Visit (INDEPENDENT_AMBULATORY_CARE_PROVIDER_SITE_OTHER): Payer: PRIVATE HEALTH INSURANCE | Admitting: Family Medicine

## 2018-06-02 ENCOUNTER — Encounter: Payer: Self-pay | Admitting: Family Medicine

## 2018-06-02 VITALS — BP 109/73 | HR 72 | Temp 98.3°F | Resp 16 | Ht 68.0 in | Wt 202.0 lb

## 2018-06-02 DIAGNOSIS — J069 Acute upper respiratory infection, unspecified: Secondary | ICD-10-CM

## 2018-06-02 DIAGNOSIS — Z79899 Other long term (current) drug therapy: Secondary | ICD-10-CM | POA: Diagnosis not present

## 2018-06-02 DIAGNOSIS — D84821 Immunodeficiency due to drugs: Secondary | ICD-10-CM

## 2018-06-02 DIAGNOSIS — J209 Acute bronchitis, unspecified: Secondary | ICD-10-CM

## 2018-06-02 MED ORDER — AZITHROMYCIN 250 MG PO TABS: ORAL_TABLET | ORAL | 0 refills | Status: DC

## 2018-06-02 NOTE — Patient Instructions (Signed)
Do not take your Dub Amis when it is due this Monday (06/05/18).

## 2018-06-02 NOTE — Progress Notes (Signed)
OFFICE VISIT  06/04/2018   CC:  Chief Complaint  Patient presents with  . Cough   HPI:    Patient is a 62 y.o. Caucasian female with RA who is on a biologic and methotrexate who presents for cough. Onset almost 3 weeks ago : hoarse throat, cough, then PND and ST.  All sx's lingering, lost voice at one point but this has gotten better.  Tussin DM helping a little, taking ibup some.  No fever.  NO SOB or wheezing.  Feeling generalized weakness.  Eating and drinking well.  Most recent orencia dose was 4 d/a.  Next dose is due in 3d.  Past Medical History:  Diagnosis Date  . Acute medial meniscus tear of right knee   . Anemia of chronic disease    Mild, Hb stable consistently  . Anxiety and depression   . Chest pain 10/2017   +Cardiac CT.  Myocardial perfusion imaging NORMAL, EF>65%  . Diabetes mellitus 1995   Managed by Dr. Elvera Lennox (Endo)  . GERD (gastroesophageal reflux disease) per pt watches diet and take tums as needed  . H/O hiatal hernia    slightly larger ("moderate" size) on CT angio done to r/o PE 10/2017.  Marland Kitchen Hyperlipidemia   . Hypertension   . Interstitial cystitis   . Lichen sclerosus of female genitalia   . Migraine   . Osteoarthritis   . Palpitations 10/2017   3 wk event monitor: symptoms correlate with sinus rhythm with PACs, at times runs of PACs up to 5 beats.  . Pseudogout of knee, left    colchicine helpful  . Psoriasis   . Seasonal allergies   . Seropositive rheumatoid arthritis (HCC)    Hands, knees, jaw, elbow: responding to Simponi as of 09/29/15 rheum f/u.  Waning effectiveness on 03/2016 f/u so pt switched to Orencia injections.  Chronic low-dose prednisone therapy+ methotrexate.  . Symptomatic cholelithiasis 10/2017   Lap chole summer 2019    Past Surgical History:  Procedure Laterality Date  . ABDOMINAL HYSTERECTOMY  1991   for fibroids; ovaries still in  . BREAST BIOPSY  2001; 02/12/16   fibrocystic breast disease in 2001 and again in 02/2016  (+scar from prev bx site w/calcifications).  . CARDIOVASCULAR STRESS TEST  11/01/2017   Myocard perf imaging: NORMAL (EF >60-65%)  . COLONOSCOPY  09/12/2013   Recall 5 yrs ([facility], Dr. Evette Cristal) due to FH of colon polyps.  Manfred Shirts   for chronic UTI  . DEXA  10/2016   Bone density normal (T-score 0.0)  . ESOPHAGOGASTRODUODENOSCOPY  04/19/2011   Nl except antral gastritis: bx = mild chron gastritis (H pyloria neg)   . EVENT MONITOR  10/2017   3 wk-->Symptoms correlate with sinus rhythm with PACs, at times runs of PACs up to 5 beats.  . intraarticular steroid injection  2013   3 R knee & L X 1; Dr Netta Corrigan  . KNEE ARTHROCENTESIS  2013   R knee x 3 & L X 1  . KNEE ARTHROSCOPY  10/21/2011   Procedure: ARTHROSCOPY KNEE;  Surgeon: Jacki Cones, MD;  Location: [facility];  Service: Orthopedics;  Laterality: Right;  WITH MEDIAL and lateral shaving of femoral chondyl  with microfracture technique of lateral and medial femoral chondyl suprapatellar synovectomy  . LAPAROSCOPIC CHOLECYSTECTOMY  01/2018  . TOTAL KNEE ARTHROPLASTY  04/12/2012   Procedure: TOTAL KNEE ARTHROPLASTY;  Surgeon: Jacki Cones, MD;  Location: [facility];  Service: Orthopedics;  Laterality: Right;  Right Total Knee Arthroplasty  . TUBAL LIGATION  1981    Outpatient Medications Prior to Visit  Medication Sig Dispense Refill  . Abatacept (ORENCIA) 50 MG/0.4ML SOSY Inject 1 Dose into the skin once a week.    . cetirizine (ZYRTEC) 10 MG tablet Take 10 mg by mouth every evening.     . colchicine 0.6 MG tablet Take 1 tablet by mouth 2 (two) times daily.    . Continuous Blood Gluc Sensor (FREESTYLE LIBRE 14 DAY SENSOR) MISC 1 each by Does not apply route every 14 (fourteen) days. Change every 2 weeks 2 each 11  . cyclobenzaprine (FLEXERIL) 5 MG tablet Take 5 mg by mouth 3 (three) times daily as needed for muscle spasms. Take 1 1/2 tablets 3 times daily    . diazepam (VALIUM) 5 MG tablet Take 1 tablet (5  mg total) by mouth every 12 (twelve) hours as needed. Migraine headaches 60 tablet 5  . FOLIC ACID PO Take 1 mg by mouth daily.     . Insulin Detemir (LEVEMIR FLEXTOUCH) 100 UNIT/ML Pen INJECT 14 UNITS INTO THE SKIN DAILY AT 10PM 15 mL 11  . JANUVIA 100 MG tablet TAKE ONE TABLET BY MOUTH EVERY DAY 30 tablet 0  . lisinopril (PRINIVIL,ZESTRIL) 20 MG tablet Take 0.5 tablets (10 mg total) by mouth daily. 45 tablet 3  . metFORMIN (GLUCOPHAGE-XR) 500 MG 24 hr tablet Take 2 tablets (1,000 mg total) by mouth 2 (two) times daily with a meal. 360 tablet 3  . methocarbamol (ROBAXIN) 500 MG tablet Take 500 mg by mouth 4 (four) times daily.    . Methotrexate Sodium (METHOTREXATE PO) Inject 1 mg as directed every Wednesday.     . Omega-3 Fatty Acids (FISH OIL) 1000 MG CAPS Take by mouth 2 (two) times daily.    . primidone (MYSOLINE) 50 MG tablet Take 50 mg by mouth. 3 by mouth at bedtime    . rosuvastatin (CRESTOR) 10 MG tablet Take 1 tablet (10 mg total) by mouth daily. 90 tablet 3  . sertraline (ZOLOFT) 100 MG tablet TAKE 1 AND 1/2 TABLETS BY MOUTH EVERY DAY 45 tablet 3  . triamcinolone (KENALOG) 0.025 % ointment Apply 1 application topically as needed.    . vitamin E (VITAMIN E) 400 UNIT capsule Take 400 Units by mouth daily.    . ranitidine (ZANTAC) 75 MG tablet Take 75 mg by mouth 2 (two) times daily.     No facility-administered medications prior to visit.     Allergies  Allergen Reactions  . Penicillins Rash  . Pravastatin     Myalgias  . Simvastatin     Myalgias  . Hydrocodone-Acetaminophen Nausea And Vomiting    ROS As per HPI  PE: Blood pressure 109/73, pulse 72, temperature 98.3 F (36.8 C), temperature source Oral, resp. rate 16, height 5\' 8"  (1.727 m), weight 202 lb (91.6 kg), SpO2 98 %. VS: noted--normal. Gen: alert, NAD, NONTOXIC APPEARING. HEENT: eyes without injection, drainage, or swelling.  Ears: EACs clear, TMs with normal light reflex and landmarks.  Nose: Clear  rhinorrhea, with some dried, crusty exudate adherent to mildly injected mucosa.  No purulent d/c.  No paranasal sinus TTP.  No facial swelling.  Throat and mouth without focal lesion.  No pharyngial swelling, erythema, or exudate.   Neck: supple, no LAD.   LUNGS: CTA bilat, nonlabored resps.   CV: RRR, no m/r/g. EXT: no c/c/e SKIN: no rash    LABS:  Chemistry      Component Value Date/Time   NA 141 02/24/2018 0848   K 4.2 02/24/2018 0848   CL 105 02/24/2018 0848   CO2 26 02/24/2018 0848   BUN 13 02/24/2018 0848   CREATININE 0.80 02/24/2018 0848   CREATININE 0.82 10/14/2017 1524      Component Value Date/Time   CALCIUM 8.9 02/24/2018 0848   ALKPHOS 47 02/11/2017 1055   AST 21 10/14/2017 1524   ALT 20 10/14/2017 1524   BILITOT 0.3 10/14/2017 1524      IMPRESSION AND PLAN:  Prolonged URI with acute bronchitis and laryngitis. Sx's w/out significant improvement.   Although she looks pretty good today, I will go ahead and treat with Z pack since she is high risk for bacterial superinfection b/c of relative immunosuppression due to her orencia and methotrexate. I recommended she NOT take her next orencia dose that is supposed to be 3 days from now.  Wait until completely over this illness before taking next dose.  An After Visit Summary was printed and given to the patient.  FOLLOW UP: Return if symptoms worsen or fail to improve.  Signed:  Santiago Bumpers, MD           06/04/2018

## 2018-06-16 ENCOUNTER — Encounter: Payer: Self-pay | Admitting: Internal Medicine

## 2018-06-16 ENCOUNTER — Ambulatory Visit (INDEPENDENT_AMBULATORY_CARE_PROVIDER_SITE_OTHER): Payer: PRIVATE HEALTH INSURANCE | Admitting: Internal Medicine

## 2018-06-16 VITALS — BP 138/82 | HR 72 | Ht 68.0 in | Wt 205.0 lb

## 2018-06-16 DIAGNOSIS — E669 Obesity, unspecified: Secondary | ICD-10-CM | POA: Diagnosis not present

## 2018-06-16 DIAGNOSIS — E119 Type 2 diabetes mellitus without complications: Secondary | ICD-10-CM | POA: Diagnosis not present

## 2018-06-16 DIAGNOSIS — Z794 Long term (current) use of insulin: Secondary | ICD-10-CM

## 2018-06-16 DIAGNOSIS — E785 Hyperlipidemia, unspecified: Secondary | ICD-10-CM | POA: Diagnosis not present

## 2018-06-16 LAB — POCT GLYCOSYLATED HEMOGLOBIN (HGB A1C): Hemoglobin A1C: 6.7 % — AB (ref 4.0–5.6)

## 2018-06-16 MED ORDER — SITAGLIPTIN PHOSPHATE 100 MG PO TABS: 100.0000 mg | ORAL_TABLET | Freq: Every day | ORAL | 11 refills | Status: DC

## 2018-06-16 MED ORDER — INSULIN DETEMIR 100 UNIT/ML FLEXPEN: PEN_INJECTOR | SUBCUTANEOUS | 11 refills | Status: DC

## 2018-06-16 MED ORDER — METFORMIN HCL ER 500 MG PO TB24: 1000.0000 mg | ORAL_TABLET | Freq: Two times a day (BID) | ORAL | 3 refills | Status: DC

## 2018-06-16 MED ORDER — GLIPIZIDE 5 MG PO TABS: 5.0000 mg | ORAL_TABLET | Freq: Every day | ORAL | 3 refills | Status: DC

## 2018-06-16 NOTE — Patient Instructions (Addendum)
Please continue: - Levemir 14 units at bedtime. - Metformin ER 1500 mg daily - Januvia 100 mg in a.m.  Please start Glipizide 2.5-5 mg before dinner.  Please come back for a follow-up appointment in 4 months.

## 2018-06-16 NOTE — Progress Notes (Signed)
Patient ID: Raven [name], female   DOB: Oct 25, 1955, 62 y.o.   MRN: [id number]  HPI: JOELLE [name] is a 62 y.o.-year-old female, returning for f/u for DM2, dx ~1995, insulin-dependent since 11/2013, controlled, without long-term complications. Last visit 6 months ago.  She had laryngitis, was on ABx. She also has arthritic pain. She had a rough several months due to these. Sugars are higher, but did notice that they improved slightly in the last week.  She continues on prednisone 5 mg daily for her RA.  Last hemoglobin A1c was: Lab Results  Component Value Date   HGBA1C 7.0 (A) 12/16/2017   HGBA1C 6.8 08/19/2017   HGBA1C 6.1 04/19/2017   She has RA >> was on Simponi (changed from Remicade) and MTX >> started Orencia and stays on MTX.  Plaquenil..  Pt is on a regimen of: - Levemir 14 units at bedtime. - Metformin ER 1500 mg daily - Januvia 100 mg in a.m. She tried Glimepiride in the past >> fluctuating blood sugar.  In the past, she was checking her sugars with her CGM, several times a day, but she cannot afford it anymore, as the price increased from $70 to $130 a month.  She checks sugars once a day: - am:  90s-110 >> 120s >> 90-110 >> 114-147 (lately improved) - 2h after b'fast: 160-180 >> 120-130 >> n/c - before lunch: n/c >> 110-120 >> n/c - 2h after lunch: n/c >> 140-154 >> n/c  - before dinner: n/c >> 130-150 >> 150 - 2h after dinner: 160-180 >> n/c >> 189-236 (lately improved) - bedtime: 110-120 >> n/c   - nighttime: n/c >> 100-150 >> n/c Lowest sugar was 72 >> 80 >> 114;  she has hypoglycemia awareness in the 70s. Highest sugar was 278 (dessert) >> 324.  Meter: ReliOn.  Pt's meals are: - Breakfast: scrambled eggs + bacon + tomatoes - Lunch: meat + 2 veggies + no bread unless burger - Dinner: meat + 2 veggies - Snacks: 2: nuts; cheese;   -noCKD, last BUN/creatinine:  Lab Results  Component Value Date   BUN 13 02/24/2018   CREATININE 0.80 02/24/2018  On  lisinopril. -+ HL; last set of lipids: Lab Results  Component Value Date   CHOL 148 02/24/2018   HDL 48.20 02/24/2018   LDLCALC 68 02/24/2018   LDLDIRECT 142.3 03/06/2008   TRIG 159.0 (H) 02/24/2018   CHOLHDL 3 02/24/2018  She was on statins but taken off b/c transaminitis in 10/2013.  Atorvastatin gave her muscle cramps.  Now no muscle aches on Crestor 10 with fish oil.- last eye exam 04/2018: No DR, + cataract >> had cataract sx's 05/2018. -No numbness and tingling in her feet.  ROS: Constitutional: no weight gain/no weight loss, + fatigue, no subjective hyperthermia, no subjective hypothermia Eyes: no blurry vision, no xerophthalmia ENT: + Sore throat, no nodules palpated in neck, no dysphagia, no odynophagia, + hoarseness Cardiovascular: no CP/no SOB/no palpitations/no leg swelling Respiratory: no cough/no SOB/no wheezing Gastrointestinal: no N/no V/no D/no C/no acid reflux Musculoskeletal: no muscle aches/+ joint aches (pseudogout) Skin: no rashes, + hair loss Neurological: no tremors/no numbness/no tingling/no dizziness  I reviewed pt's medications, allergies, PMH, social hx, family hx, and changes were documented in the history of present illness. Otherwise, unchanged from my initial visit note.  Past Medical History:  Diagnosis Date  . Acute medial meniscus tear of right knee   . Anemia of chronic disease    Mild, Hb stable consistently  .  Anxiety and depression   . Chest pain 10/2017   +Cardiac CT.  Myocardial perfusion imaging NORMAL, EF>65%  . Diabetes mellitus 1995   Managed by Dr. Elvera Lennox (Endo)  . GERD (gastroesophageal reflux disease) per pt watches diet and take tums as needed  . H/O hiatal hernia    slightly larger ("moderate" size) on CT angio done to r/o PE 10/2017.  Marland Kitchen Hyperlipidemia   . Hypertension   . Interstitial cystitis   . Lichen sclerosus of female genitalia   . Migraine   . Osteoarthritis   . Palpitations 10/2017   3 wk event monitor: symptoms  correlate with sinus rhythm with PACs, at times runs of PACs up to 5 beats.  . Pseudogout of knee, left    colchicine helpful  . Psoriasis   . Seasonal allergies   . Seropositive rheumatoid arthritis (HCC)    Hands, knees, jaw, elbow: responding to Simponi as of 09/29/15 rheum f/u.  Waning effectiveness on 03/2016 f/u so pt switched to Orencia injections.  Chronic low-dose prednisone therapy+ methotrexate.  . Symptomatic cholelithiasis 10/2017   Lap chole summer 2019    Past Surgical History:  Procedure Laterality Date  . ABDOMINAL HYSTERECTOMY  1991   for fibroids; ovaries still in  . BREAST BIOPSY  2001; 02/12/16   fibrocystic breast disease in 2001 and again in 02/2016 (+scar from prev bx site w/calcifications).  . CARDIOVASCULAR STRESS TEST  11/01/2017   Myocard perf imaging: NORMAL (EF >60-65%)  . COLONOSCOPY  09/12/2013   Recall 5 yrs ([facility], Dr. Evette Cristal) due to FH of colon polyps.  Manfred Shirts   for chronic UTI  . DEXA  10/2016   Bone density normal (T-score 0.0)  . ESOPHAGOGASTRODUODENOSCOPY  04/19/2011   Nl except antral gastritis: bx = mild chron gastritis (H pyloria neg)   . EVENT MONITOR  10/2017   3 wk-->Symptoms correlate with sinus rhythm with PACs, at times runs of PACs up to 5 beats.  . intraarticular steroid injection  2013   3 R knee & L X 1; Dr Netta Corrigan  . KNEE ARTHROCENTESIS  2013   R knee x 3 & L X 1  . KNEE ARTHROSCOPY  10/21/2011   Procedure: ARTHROSCOPY KNEE;  Surgeon: Jacki Cones, MD;  Location[facility];  Service: Orthopedics;  Laterality: Right;  WITH MEDIAL and lateral shaving of femoral chondyl  with microfracture technique of lateral and medial femoral chondyl suprapatellar synovectomy  . LAPAROSCOPIC CHOLECYSTECTOMY  01/2018  . TOTAL KNEE ARTHROPLASTY  04/12/2012   Procedure: TOTAL KNEE ARTHROPLASTY;  Surgeon: Jacki Cones, MD;  Location: [facility];  Service: Orthopedics;  Laterality: Right;  Right Total Knee Arthroplasty   . TUBAL LIGATION  1981    Social History   Socioeconomic History  . Marital status: Married    Spouse name: Not on file  . Number of children: Not on file  . Years of education: Not on file  . Highest education level: Not on file  Occupational History  . Not on file  Social Needs  . Financial resource strain: Not on file  . Food insecurity:    Worry: Not on file    Inability: Not on file  . Transportation needs:    Medical: Not on file    Non-medical: Not on file  Tobacco Use  . Smoking status: Never Smoker  . Smokeless tobacco: Never Used  Substance and Sexual Activity  . Alcohol use: No  .  Drug use: No  . Sexual activity: Not on file  Lifestyle  . Physical activity:    Days per week: Not on file    Minutes per session: Not on file  . Stress: Not on file  Relationships  . Social connections:    Talks on phone: Not on file    Gets together: Not on file    Attends religious service: Not on file    Active member of club or organization: Not on file    Attends meetings of clubs or organizations: Not on file    Relationship status: Not on file  . Intimate partner violence:    Fear of current or ex partner: Not on file    Emotionally abused: Not on file    Physically abused: Not on file    Forced sexual activity: Not on file  Other Topics Concern  . Not on file  Social History Narrative   Married, 3 children (2 in [place], 1 in [place]).  4 grandchildren.   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)   No tob/alc/drugs.   2 cups of coffee/day x 3 months   Regular exercise- no-due to arthritis.   Religion affecting care, "it allows stress management:"    Current Outpatient Medications on File Prior to Visit  Medication Sig Dispense Refill  . Abatacept (ORENCIA) 50 MG/0.4ML SOSY Inject 1 Dose into the skin once a week.    . cetirizine (ZYRTEC) 10 MG tablet Take 10 mg by mouth every evening.     . colchicine 0.6 MG tablet Take 1 tablet by mouth 2 (two) times  daily.    . Continuous Blood Gluc Sensor (FREESTYLE LIBRE 14 DAY SENSOR) MISC 1 each by Does not apply route every 14 (fourteen) days. Change every 2 weeks 2 each 11  . cyclobenzaprine (FLEXERIL) 5 MG tablet Take 5 mg by mouth 3 (three) times daily as needed for muscle spasms. Take 1 1/2 tablets 3 times daily    . diazepam (VALIUM) 5 MG tablet Take 1 tablet (5 mg total) by mouth every 12 (twelve) hours as needed. Migraine headaches 60 tablet 5  . FOLIC ACID PO Take 1 mg by mouth daily.     . Insulin Detemir (LEVEMIR FLEXTOUCH) 100 UNIT/ML Pen INJECT 14 UNITS INTO THE SKIN DAILY AT 10PM 15 mL 11  . JANUVIA 100 MG tablet TAKE ONE TABLET BY MOUTH EVERY DAY 30 tablet 0  . lisinopril (PRINIVIL,ZESTRIL) 20 MG tablet Take 0.5 tablets (10 mg total) by mouth daily. 45 tablet 3  . metFORMIN (GLUCOPHAGE-XR) 500 MG 24 hr tablet Take 2 tablets (1,000 mg total) by mouth 2 (two) times daily with a meal. 360 tablet 3  . methocarbamol (ROBAXIN) 500 MG tablet Take 500 mg by mouth 4 (four) times daily.    . Methotrexate Sodium (METHOTREXATE PO) Inject 1 mg as directed every Wednesday.     . Omega-3 Fatty Acids (FISH OIL) 1000 MG CAPS Take by mouth 2 (two) times daily.    . primidone (MYSOLINE) 50 MG tablet Take 50 mg by mouth. 3 by mouth at bedtime    . rosuvastatin (CRESTOR) 10 MG tablet Take 1 tablet (10 mg total) by mouth daily. 90 tablet 3  . sertraline (ZOLOFT) 100 MG tablet TAKE 1 AND 1/2 TABLETS BY MOUTH EVERY DAY 45 tablet 3  . triamcinolone (KENALOG) 0.025 % ointment Apply 1 application topically as needed.    . vitamin E (VITAMIN E) 400 UNIT capsule Take 400 Units by mouth  daily.     No current facility-administered medications on file prior to visit.     Allergies  Allergen Reactions  . Penicillins Rash  . Pravastatin     Myalgias  . Simvastatin     Myalgias  . Hydrocodone-Acetaminophen Nausea And Vomiting    Family History  Problem Relation Age of Onset  . Diabetes Mother   . Stroke Mother  36  . Osteoarthritis Mother   . Stroke Brother 18  . Heart disease Father        CABG  . Lung cancer Father   . Prostate cancer Father   . Pancreatic cancer Father   . Pancreatic cancer Paternal Grandfather   . Bipolar disorder Daughter   . Anxiety disorder Son   . Arthritis Maternal Grandmother        rheumatoid  . Heart attack Brother 44       smoker    PE: BP 138/82   Pulse 72   Ht 5\' 8"  (1.727 m) Comment: measured  Wt 205 lb (93 kg)   SpO2 98%   BMI 31.17 kg/m  Body mass index is 31.17 kg/m.  Wt Readings from Last 3 Encounters:  06/16/18 205 lb (93 kg)  06/02/18 202 lb (91.6 kg)  04/07/18 208 lb 12.8 oz (94.7 kg)   Constitutional: overweight, in NAD Eyes: PERRLA, EOMI, no exophthalmos ENT: moist mucous membranes, no thyromegaly, no cervical lymphadenopathy Cardiovascular: RRR, No MRG Respiratory: CTA B Gastrointestinal: abdomen soft, NT, ND, BS+ Musculoskeletal: no deformities, strength intact in all 4 Skin: moist, warm, no rashes Neurological: + tremor with outstretched hands, DTR normal in all 4  ASSESSMENT: 1. DM2, insulin-dependent, controlled, without long term complications, but with Hgly  2. HL  3.  Obesity class I  PLAN:  1. Patient with longstanding, previously uncontrolled diabetes, now with better control on low-dose basal insulin, metformin, and Januvia.  She was having diarrhea with regular metformin formulation.  After switching to the ER formulation, diarrhea improved, but not 100%.  She is now taking 500 mg 2-3 times a day depending on her symptoms. -She was previously on the freestyle libre CGM, but cannot afford it anymore. -At last visit, she relaxed her diet more and her HbA1c was higher, at 7.0%.  We discussed about going back to the whole 30 diet. -At this visit, sugars appear higher, as she had higher sugars in the last few months due to increased joint pain and pharyngitis.  Sugar started to improve in the last week.  They are still  above target, especially after dinner.  We discussed about possibly changing from Januvia to a GLP-1 receptor agonist, but she would like to defer this until after the first of the year.  For now, she agrees to start glipizide to cover her dinner.  I advised her to use a 2.5 mg dose before dinner and if the sugars are still high afterwards, to increase the dose to 5 mg.  At next visit, we will reevaluate the need for a GLP-1 receptor agonist. - I suggested to:  Patient Instructions  Please continue: - Levemir 14 units at bedtime. - Metformin ER 1500 mg daily - Januvia 100 mg in a.m.  Please start Glipizide 2.5-5 mg before dinner.  Please come back for a follow-up appointment in 4 months.  - today, HbA1c is 6.7% (improved) - continue checking sugars at different times of the day - check 1x a day, rotating checks - advised for yearly eye exams >> she  is UTD - Return to clinic in 4 mo with sugar log      2. HL - Reviewed latest lipid panel from 02/2018: Triglycerides slightly high, LDL at goal Lab Results  Component Value Date   CHOL 148 02/24/2018   HDL 48.20 02/24/2018   LDLCALC 68 02/24/2018   LDLDIRECT 142.3 03/06/2008   TRIG 159.0 (H) 02/24/2018   CHOLHDL 3 02/24/2018  - Continues Crestor and fish oil without side effects.  3.  Obesity class I -Weight is stable since last visit, she did not lose weight -A GLP-1 receptor agonist will help with weight, also.  We will discuss again about this at next visit.  Carlus Pavlov, MD PhD [facility] Endocrinology

## 2018-07-14 ENCOUNTER — Encounter: Payer: Self-pay | Admitting: Family Medicine

## 2018-07-14 ENCOUNTER — Ambulatory Visit (INDEPENDENT_AMBULATORY_CARE_PROVIDER_SITE_OTHER): Payer: 59 | Admitting: Family Medicine

## 2018-07-14 VITALS — BP 126/79 | HR 72 | Temp 98.2°F | Resp 16 | Ht 68.0 in | Wt 208.5 lb

## 2018-07-14 DIAGNOSIS — D899 Disorder involving the immune mechanism, unspecified: Secondary | ICD-10-CM

## 2018-07-14 DIAGNOSIS — J209 Acute bronchitis, unspecified: Secondary | ICD-10-CM | POA: Diagnosis not present

## 2018-07-14 DIAGNOSIS — J069 Acute upper respiratory infection, unspecified: Secondary | ICD-10-CM

## 2018-07-14 DIAGNOSIS — D849 Immunodeficiency, unspecified: Secondary | ICD-10-CM

## 2018-07-14 MED ORDER — AZITHROMYCIN 250 MG PO TABS: ORAL_TABLET | ORAL | 0 refills | Status: DC

## 2018-07-14 MED ORDER — ALBUTEROL SULFATE HFA 108 (90 BASE) MCG/ACT IN AERS: 2.0000 | INHALATION_SPRAY | RESPIRATORY_TRACT | 0 refills | Status: DC | PRN

## 2018-07-14 MED ORDER — HYDROCODONE-HOMATROPINE 5-1.5 MG/5ML PO SYRP: ORAL_SOLUTION | ORAL | 0 refills | Status: DC

## 2018-07-14 MED ORDER — PREDNISONE 20 MG PO TABS: ORAL_TABLET | ORAL | 0 refills | Status: DC

## 2018-07-14 NOTE — Progress Notes (Signed)
OFFICE VISIT  07/14/2018   CC:  Chief Complaint  Patient presents with  . Cough   HPI:    Patient is a 63 y.o. Caucasian female who presents for cough. Onset 4 d/a, sneezing and this progressed to cough, nasal/sinus congestion, HA.  ST starting, related to all the coughing. Started flonase, tussin dm, airborn, and tylenol.  Tussin no help. Coughing up thick green mucous.  Temp 101.5 max. No body aches.  +Chest tightness and wheezy.  No SOB. No n/v.  Has chronic diarrhea due to her meds. Most recent orencia and methotrexate doses were 4 d/a.  She also takes 5mg  prednisone daily (this is all for her seropos RA).  ROS: no CP, no SOB, no dizziness, no rashes, no melena/hematochezia.  No polyuria or polydipsia.  No myalgias or arthralgias.    Past Medical History:  Diagnosis Date  . Acute medial meniscus tear of right knee   . Anemia of chronic disease    Mild, Hb stable consistently  . Anxiety and depression   . Chest pain 10/2017   +Cardiac CT.  Myocardial perfusion imaging NORMAL, EF>65%  . Diabetes mellitus 1995   Managed by Dr. Elvera Lennox (Endo)  . GERD (gastroesophageal reflux disease) per pt watches diet and take tums as needed  . H/O hiatal hernia    slightly larger ("moderate" size) on CT angio done to r/o PE 10/2017.  Marland Kitchen Hyperlipidemia   . Hypertension   . Interstitial cystitis   . Lichen sclerosus of female genitalia   . Migraine   . Osteoarthritis   . Palpitations 10/2017   3 wk event monitor: symptoms correlate with sinus rhythm with PACs, at times runs of PACs up to 5 beats.  . Pseudogout of knee, left    colchicine helpful  . Psoriasis   . Seasonal allergies   . Seropositive rheumatoid arthritis (HCC)    Hands, knees, jaw, elbow: responding to Simponi as of 09/29/15 rheum f/u.  Waning effectiveness on 03/2016 f/u so pt switched to Orencia injections.  Chronic low-dose prednisone therapy+ methotrexate.  . Symptomatic cholelithiasis 10/2017   Lap chole summer 2019     Past Surgical History:  Procedure Laterality Date  . ABDOMINAL HYSTERECTOMY  1991   for fibroids; ovaries still in  . BREAST BIOPSY  2001; 02/12/16   fibrocystic breast disease in 2001 and again in 02/2016 (+scar from prev bx site w/calcifications).  . CARDIOVASCULAR STRESS TEST  11/01/2017   Myocard perf imaging: NORMAL (EF >60-65%)  . COLONOSCOPY  09/12/2013   Recall 5 yrs ([facility], Dr. Evette Cristal) due to FH of colon polyps.  Manfred Shirts   for chronic UTI  . DEXA  10/2016   Bone density normal (T-score 0.0)  . ESOPHAGOGASTRODUODENOSCOPY  04/19/2011   Nl except antral gastritis: bx = mild chron gastritis (H pyloria neg)   . EVENT MONITOR  10/2017   3 wk-->Symptoms correlate with sinus rhythm with PACs, at times runs of PACs up to 5 beats.  . intraarticular steroid injection  2013   3 R knee & L X 1; Dr Netta Corrigan  . KNEE ARTHROCENTESIS  2013   R knee x 3 & L X 1  . KNEE ARTHROSCOPY  10/21/2011   Procedure: ARTHROSCOPY KNEE;  Surgeon: Jacki Cones, MD;  Location: [facility];  Service: Orthopedics;  Laterality: Right;  WITH MEDIAL and lateral shaving of femoral chondyl  with microfracture technique of lateral and medial femoral chondyl suprapatellar synovectomy  .  LAPAROSCOPIC CHOLECYSTECTOMY  01/2018  . TOTAL KNEE ARTHROPLASTY  04/12/2012   Procedure: TOTAL KNEE ARTHROPLASTY;  Surgeon: Jacki Cones, MD;  Location: [facility];  Service: Orthopedics;  Laterality: Right;  Right Total Knee Arthroplasty  . TUBAL LIGATION  1981    Outpatient Medications Prior to Visit  Medication Sig Dispense Refill  . Abatacept (ORENCIA) 50 MG/0.4ML SOSY Inject 1 Dose into the skin once a week.    . cetirizine (ZYRTEC) 10 MG tablet Take 10 mg by mouth every evening.     . colchicine 0.6 MG tablet Take 1 tablet by mouth 2 (two) times daily.    . Continuous Blood Gluc Sensor (FREESTYLE LIBRE 14 DAY SENSOR) MISC 1 each by Does not apply route every 14 (fourteen) days. Change every 2  weeks 2 each 11  . cyclobenzaprine (FLEXERIL) 5 MG tablet Take 5 mg by mouth 3 (three) times daily as needed for muscle spasms. Take 1 1/2 tablets 3 times daily    . diazepam (VALIUM) 5 MG tablet Take 1 tablet (5 mg total) by mouth every 12 (twelve) hours as needed. Migraine headaches 60 tablet 5  . FOLIC ACID PO Take 1 mg by mouth daily.     Marland Kitchen glipiZIDE (GLUCOTROL) 5 MG tablet Take 1 tablet (5 mg total) by mouth daily before supper. 90 tablet 3  . Insulin Detemir (LEVEMIR FLEXTOUCH) 100 UNIT/ML Pen INJECT 14 UNITS INTO THE SKIN DAILY AT 10PM 15 mL 11  . lisinopril (PRINIVIL,ZESTRIL) 20 MG tablet Take 0.5 tablets (10 mg total) by mouth daily. 45 tablet 3  . metFORMIN (GLUCOPHAGE-XR) 500 MG 24 hr tablet Take 2 tablets (1,000 mg total) by mouth 2 (two) times daily with a meal. 360 tablet 3  . methocarbamol (ROBAXIN) 500 MG tablet Take 500 mg by mouth 4 (four) times daily.    . Methotrexate Sodium (METHOTREXATE PO) Inject 1 mg as directed every Wednesday.     . Omega-3 Fatty Acids (FISH OIL) 1000 MG CAPS Take by mouth 2 (two) times daily.    . primidone (MYSOLINE) 50 MG tablet Take 50 mg by mouth. 3 by mouth at bedtime    . rosuvastatin (CRESTOR) 10 MG tablet Take 1 tablet (10 mg total) by mouth daily. 90 tablet 3  . sertraline (ZOLOFT) 100 MG tablet TAKE 1 AND 1/2 TABLETS BY MOUTH EVERY DAY 45 tablet 3  . sitaGLIPtin (JANUVIA) 100 MG tablet Take 1 tablet (100 mg total) by mouth daily. 30 tablet 11  . triamcinolone (KENALOG) 0.025 % ointment Apply 1 application topically as needed.    . vitamin E (VITAMIN E) 400 UNIT capsule Take 400 Units by mouth daily.     No facility-administered medications prior to visit.     Allergies  Allergen Reactions  . Penicillins Rash  . Pravastatin     Myalgias  . Simvastatin     Myalgias  . Hydrocodone-Acetaminophen Nausea And Vomiting    ROS As per HPI  PE: Blood pressure 126/79, pulse 72, temperature 98.2 F (36.8 C), temperature source Oral, resp.  rate 16, height 5\' 8"  (1.727 m), weight 208 lb 8 oz (94.6 kg), SpO2 100 %. Body mass index is 31.7 kg/m.  VS: noted--normal. Gen: alert, NAD, NONTOXIC APPEARING. HEENT: eyes without injection, drainage, or swelling.  Ears: EACs clear, TMs with normal light reflex and landmarks.  Nose: Clear rhinorrhea, with some dried, crusty exudate adherent to mildly injected mucosa.  No purulent d/c.  No paranasal sinus TTP.  No facial  swelling.  Throat and mouth without focal lesion.  No pharyngial swelling, erythema, or exudate.   Neck: supple, no LAD.   LUNGS: CTA bilat, nonlabored resps.  She does have easy coughing with forced exp maneuver. CV: RRR, no m/r/g. EXT: no c/c/e SKIN: no rash    LABS:    Chemistry      Component Value Date/Time   NA 141 02/24/2018 0848   K 4.2 02/24/2018 0848   CL 105 02/24/2018 0848   CO2 26 02/24/2018 0848   BUN 13 02/24/2018 0848   CREATININE 0.80 02/24/2018 0848   CREATININE 0.82 10/14/2017 1524      Component Value Date/Time   CALCIUM 8.9 02/24/2018 0848   ALKPHOS 47 02/11/2017 1055   AST 21 10/14/2017 1524   ALT 20 10/14/2017 1524   BILITOT 0.3 10/14/2017 1524      IMPRESSION AND PLAN:  1) URI with acute bronchitis, viral etiology most likely but pt is on immunosuppressant medication chronically so I will cover for the possibility of bacterial etiology as well. Z pack. Prednisone 40mg  qd x 5d, then resume 5mg  qd usual dosing. Hycodan syrup, 1-2 tsp bid prn, #120 ml. Ventolin HFA, 1-2 p q4h prn.  An After Visit Summary was printed and given to the patient.  FOLLOW UP: Return if symptoms worsen or fail to improve.  Signed:  Santiago BumpersPhil Abigal Choung, MD           07/14/2018

## 2018-08-17 ENCOUNTER — Encounter: Payer: Self-pay | Admitting: *Deleted

## 2018-08-17 DIAGNOSIS — F329 Major depressive disorder, single episode, unspecified: Secondary | ICD-10-CM | POA: Insufficient documentation

## 2018-08-17 DIAGNOSIS — F419 Anxiety disorder, unspecified: Secondary | ICD-10-CM | POA: Insufficient documentation

## 2018-08-17 DIAGNOSIS — F32A Depression, unspecified: Secondary | ICD-10-CM | POA: Insufficient documentation

## 2018-08-18 ENCOUNTER — Encounter: Payer: Self-pay | Admitting: *Deleted

## 2018-08-18 ENCOUNTER — Ambulatory Visit (INDEPENDENT_AMBULATORY_CARE_PROVIDER_SITE_OTHER): Payer: 59 | Admitting: Family Medicine

## 2018-08-18 ENCOUNTER — Encounter: Payer: Self-pay | Admitting: Family Medicine

## 2018-08-18 VITALS — BP 107/72 | HR 69 | Temp 98.1°F | Resp 16 | Ht 67.0 in | Wt 209.4 lb

## 2018-08-18 DIAGNOSIS — I10 Essential (primary) hypertension: Secondary | ICD-10-CM | POA: Diagnosis not present

## 2018-08-18 DIAGNOSIS — E7800 Pure hypercholesterolemia, unspecified: Secondary | ICD-10-CM | POA: Diagnosis not present

## 2018-08-18 DIAGNOSIS — D649 Anemia, unspecified: Secondary | ICD-10-CM | POA: Diagnosis not present

## 2018-08-18 DIAGNOSIS — Z79899 Other long term (current) drug therapy: Secondary | ICD-10-CM

## 2018-08-18 DIAGNOSIS — Z0000 Encounter for general adult medical examination without abnormal findings: Secondary | ICD-10-CM

## 2018-08-18 DIAGNOSIS — E119 Type 2 diabetes mellitus without complications: Secondary | ICD-10-CM

## 2018-08-18 DIAGNOSIS — Z23 Encounter for immunization: Secondary | ICD-10-CM | POA: Diagnosis not present

## 2018-08-18 DIAGNOSIS — E669 Obesity, unspecified: Secondary | ICD-10-CM

## 2018-08-18 LAB — CBC WITH DIFFERENTIAL/PLATELET
BASOS PCT: 0.8 % (ref 0.0–3.0)
Basophils Absolute: 0.1 10*3/uL (ref 0.0–0.1)
Eosinophils Absolute: 0.2 10*3/uL (ref 0.0–0.7)
Eosinophils Relative: 2.1 % (ref 0.0–5.0)
HCT: 35.4 % — ABNORMAL LOW (ref 36.0–46.0)
Hemoglobin: 11.3 g/dL — ABNORMAL LOW (ref 12.0–15.0)
LYMPHS ABS: 3.3 10*3/uL (ref 0.7–4.0)
Lymphocytes Relative: 42.5 % (ref 12.0–46.0)
MCHC: 32.1 g/dL (ref 30.0–36.0)
MCV: 82.2 fl (ref 78.0–100.0)
Monocytes Absolute: 0.8 10*3/uL (ref 0.1–1.0)
Monocytes Relative: 10.1 % (ref 3.0–12.0)
NEUTROS ABS: 3.5 10*3/uL (ref 1.4–7.7)
NEUTROS PCT: 44.5 % (ref 43.0–77.0)
PLATELETS: 246 10*3/uL (ref 150.0–400.0)
RBC: 4.3 Mil/uL (ref 3.87–5.11)
RDW: 17.4 % — ABNORMAL HIGH (ref 11.5–15.5)
WBC: 7.8 10*3/uL (ref 4.0–10.5)

## 2018-08-18 LAB — COMPREHENSIVE METABOLIC PANEL
ALT: 44 U/L — ABNORMAL HIGH (ref 0–35)
AST: 33 U/L (ref 0–37)
Albumin: 4.1 g/dL (ref 3.5–5.2)
Alkaline Phosphatase: 46 U/L (ref 39–117)
BUN: 17 mg/dL (ref 6–23)
CO2: 23 mEq/L (ref 19–32)
Calcium: 8.5 mg/dL (ref 8.4–10.5)
Chloride: 106 mEq/L (ref 96–112)
Creatinine, Ser: 0.86 mg/dL (ref 0.40–1.20)
GFR: 66.61 mL/min (ref >60.00)
GLUCOSE: 87 mg/dL (ref 70–99)
Potassium: 4.3 mEq/L (ref 3.5–5.1)
Sodium: 141 mEq/L (ref 135–145)
Total Bilirubin: 0.3 mg/dL (ref 0.2–1.2)
Total Protein: 6.2 g/dL (ref 6.0–8.3)

## 2018-08-18 LAB — LIPID PANEL
Cholesterol: 160 mg/dL (ref 0–200)
HDL: 39.2 mg/dL (ref >39.00)
LDL Cholesterol: 97 mg/dL (ref 0–99)
NONHDL: 120.52
Total CHOL/HDL Ratio: 4
Triglycerides: 120 mg/dL (ref 0.0–149.0)
VLDL: 24 mg/dL (ref 0.0–40.0)

## 2018-08-18 LAB — TSH: TSH: 3.72 u[IU]/mL (ref 0.35–4.50)

## 2018-08-18 NOTE — Progress Notes (Signed)
Office Note 08/18/2018  CC:  Chief Complaint  Patient presents with  . Annual Exam    Pt is fasting.     HPI:  Raven [name] is a 63 y.o. White female who is here for annual health maintenance exam.  Exercise: not doing any, "I need to start walking" Diet: tries to eat healthy.  No acute complaints.   Past Medical History:  Diagnosis Date  . Acute medial meniscus tear of right knee   . Anemia of chronic disease    Mild, Hb stable consistently  . Anxiety and depression   . Chest pain 10/2017   +Cardiac CT.  Myocardial perfusion imaging NORMAL, EF>65%  . Diabetes mellitus 1995   Managed by Dr. Elvera Lennox (Endo)  . GERD (gastroesophageal reflux disease) per pt watches diet and take tums as needed  . H/O hiatal hernia    slightly larger ("moderate" size) on CT angio done to r/o PE 10/2017.  Marland Kitchen Hyperlipidemia   . Hypertension   . Interstitial cystitis   . Lichen sclerosus of female genitalia   . Migraine   . Osteoarthritis   . Palpitations 10/2017   3 wk event monitor: symptoms correlate with sinus rhythm with PACs, at times runs of PACs up to 5 beats.  . Pseudogout of knee, left    colchicine helpful  . Psoriasis   . Seasonal allergies   . Seropositive rheumatoid arthritis (HCC)    Hands, knees, jaw, elbow: responding to Simponi as of 09/29/15 rheum f/u.  Waning effectiveness on 03/2016 f/u so pt switched to Orencia injections.  Chronic low-dose prednisone therapy+ methotrexate.  . Symptomatic cholelithiasis 10/2017   Lap chole summer 2019    Past Surgical History:  Procedure Laterality Date  . ABDOMINAL HYSTERECTOMY  1991   for fibroids; ovaries still in  . BREAST BIOPSY  2001; 02/12/16   fibrocystic breast disease in 2001 and again in 02/2016 (+scar from prev bx site w/calcifications).  . CARDIOVASCULAR STRESS TEST  11/01/2017   Myocard perf imaging: NORMAL (EF >60-65%)  . COLONOSCOPY  09/12/2013   Recall 5 yrs ([facility], Dr. Evette Cristal) due to FH of colon polyps.  Manfred Shirts   for chronic UTI  . DEXA  10/2016   Bone density normal (T-score 0.0)  . ESOPHAGOGASTRODUODENOSCOPY  04/19/2011   Nl except antral gastritis: bx = mild chron gastritis (H pyloria neg)   . EVENT MONITOR  10/2017   3 wk-->Symptoms correlate with sinus rhythm with PACs, at times runs of PACs up to 5 beats.  . intraarticular steroid injection  2013   3 R knee & L X 1; Dr Netta Corrigan  . KNEE ARTHROCENTESIS  2013   R knee x 3 & L X 1  . KNEE ARTHROSCOPY  10/21/2011   Procedure: ARTHROSCOPY KNEE;  Surgeon: Jacki Cones, MD;  Location: [facility];  Service: Orthopedics;  Laterality: Right;  WITH MEDIAL and lateral shaving of femoral chondyl  with microfracture technique of lateral and medial femoral chondyl suprapatellar synovectomy  . LAPAROSCOPIC CHOLECYSTECTOMY  01/2018  . TOTAL KNEE ARTHROPLASTY  04/12/2012   Procedure: TOTAL KNEE ARTHROPLASTY;  Surgeon: Jacki Cones, MD;  Location: [facility];  Service: Orthopedics;  Laterality: Right;  Right Total Knee Arthroplasty  . TUBAL LIGATION  1981    Family History  Problem Relation Age of Onset  . Diabetes Mother   . Stroke Mother 35  . Osteoarthritis Mother   . Stroke Brother 14  .  Heart disease Father        CABG  . Lung cancer Father   . Prostate cancer Father   . Pancreatic cancer Father   . Pancreatic cancer Paternal Grandfather   . Bipolar disorder Daughter   . Anxiety disorder Son   . Arthritis Maternal Grandmother        rheumatoid  . Heart attack Brother 84       smoker    Social History   Socioeconomic History  . Marital status: Married    Spouse name: Not on file  . Number of children: Not on file  . Years of education: Not on file  . Highest education level: Not on file  Occupational History  . Not on file  Social Needs  . Financial resource strain: Not on file  . Food insecurity:    Worry: Not on file    Inability: Not on file  . Transportation needs:    Medical: Not on file     Non-medical: Not on file  Tobacco Use  . Smoking status: Never Smoker  . Smokeless tobacco: Never Used  Substance and Sexual Activity  . Alcohol use: No  . Drug use: No  . Sexual activity: Not on file  Lifestyle  . Physical activity:    Days per week: Not on file    Minutes per session: Not on file  . Stress: Not on file  Relationships  . Social connections:    Talks on phone: Not on file    Gets together: Not on file    Attends religious service: Not on file    Active member of club or organization: Not on file    Attends meetings of clubs or organizations: Not on file    Relationship status: Not on file  . Intimate partner violence:    Fear of current or ex partner: Not on file    Emotionally abused: Not on file    Physically abused: Not on file    Forced sexual activity: Not on file  Other Topics Concern  . Not on file  Social History Narrative   Married, 3 children (2 in [place], 1 in [place]).  4 grandchildren.   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)   No tob/alc/drugs.   2 cups of coffee/day x 3 months   Regular exercise- no-due to arthritis.   Religion affecting care, "it allows stress management:"    Outpatient Medications Prior to Visit  Medication Sig Dispense Refill  . Abatacept (ORENCIA) 50 MG/0.4ML SOSY Inject 1 Dose into the skin once a week.    . cetirizine (ZYRTEC) 10 MG tablet Take 10 mg by mouth every evening.     . colchicine 0.6 MG tablet Take 1 tablet by mouth 2 (two) times daily.    . cyclobenzaprine (FLEXERIL) 5 MG tablet Take 5 mg by mouth 3 (three) times daily as needed for muscle spasms. Take 1 1/2 tablets 3 times daily    . diazepam (VALIUM) 5 MG tablet Take 1 tablet (5 mg total) by mouth every 12 (twelve) hours as needed. Migraine headaches 60 tablet 5  . FOLIC ACID PO Take 1 mg by mouth daily.     Marland Kitchen glipiZIDE (GLUCOTROL) 5 MG tablet Take 1 tablet (5 mg total) by mouth daily before supper. 90 tablet 3  . Insulin Detemir (LEVEMIR  FLEXTOUCH) 100 UNIT/ML Pen INJECT 14 UNITS INTO THE SKIN DAILY AT 10PM 15 mL 11  . lisinopril (PRINIVIL,ZESTRIL) 20 MG tablet Take 0.5 tablets (  10 mg total) by mouth daily. 45 tablet 3  . metFORMIN (GLUCOPHAGE-XR) 500 MG 24 hr tablet Take 2 tablets (1,000 mg total) by mouth 2 (two) times daily with a meal. 360 tablet 3  . Methotrexate Sodium (METHOTREXATE PO) Inject 1 mg as directed every Wednesday.     . Omega-3 Fatty Acids (FISH OIL) 1000 MG CAPS Take by mouth 2 (two) times daily.    . predniSONE (DELTASONE) 5 MG tablet Take 5 mg by mouth daily.    . primidone (MYSOLINE) 50 MG tablet Take 50 mg by mouth. 3 by mouth at bedtime    . rosuvastatin (CRESTOR) 10 MG tablet Take 1 tablet (10 mg total) by mouth daily. 90 tablet 3  . sertraline (ZOLOFT) 100 MG tablet TAKE 1 AND 1/2 TABLETS BY MOUTH EVERY DAY 45 tablet 3  . sitaGLIPtin (JANUVIA) 100 MG tablet Take 1 tablet (100 mg total) by mouth daily. 30 tablet 11  . vitamin E (VITAMIN E) 400 UNIT capsule Take 400 Units by mouth daily.    Marland Kitchen. albuterol (VENTOLIN HFA) 108 (90 Base) MCG/ACT inhaler Inhale 2 puffs into the lungs every 4 (four) hours as needed for wheezing or shortness of breath. (Patient not taking: Reported on 08/18/2018) 1 Inhaler 0  . azithromycin (ZITHROMAX) 250 MG tablet 2 tabs po qd x 1d, then 1 tab po qd x 4d (Patient not taking: Reported on 08/18/2018) 6 tablet 0  . Continuous Blood Gluc Sensor (FREESTYLE LIBRE 14 DAY SENSOR) MISC 1 each by Does not apply route every 14 (fourteen) days. Change every 2 weeks (Patient not taking: Reported on 08/18/2018) 2 each 11  . HYDROcodone-homatropine (HYCODAN) 5-1.5 MG/5ML syrup 1-2 tsp po bid prn cough (Patient not taking: Reported on 08/18/2018) 120 mL 0  . methocarbamol (ROBAXIN) 500 MG tablet Take 500 mg by mouth 4 (four) times daily.    . predniSONE (DELTASONE) 20 MG tablet 2 tabs po qd x 5d (Patient not taking: Reported on 08/18/2018) 10 tablet 0  . triamcinolone (KENALOG) 0.025 % ointment Apply 1  application topically as needed.     No facility-administered medications prior to visit.     Allergies  Allergen Reactions  . Penicillins Rash  . Pravastatin     Myalgias  . Simvastatin     Myalgias  . Hydrocodone-Acetaminophen Nausea And Vomiting    ROS Review of Systems  Constitutional: Negative for appetite change, chills, fatigue and fever.  HENT: Negative for congestion, dental problem, ear pain and sore throat.   Eyes: Negative for discharge, redness and visual disturbance.  Respiratory: Negative for cough, chest tightness, shortness of breath and wheezing.   Cardiovascular: Negative for chest pain, palpitations and leg swelling.  Gastrointestinal: Negative for abdominal pain, blood in stool, diarrhea, nausea and vomiting.  Genitourinary: Negative for difficulty urinating, dysuria, flank pain, frequency, hematuria and urgency.  Musculoskeletal: Negative for arthralgias, back pain, joint swelling, myalgias and neck stiffness.  Skin: Negative for pallor and rash.  Neurological: Negative for dizziness, speech difficulty, weakness and headaches.  Hematological: Negative for adenopathy. Does not bruise/bleed easily.  Psychiatric/Behavioral: Negative for confusion and sleep disturbance. The patient is not nervous/anxious.     PE; Blood pressure 107/72, pulse 69, temperature 98.1 F (36.7 C), temperature source Oral, resp. rate 16, height 5\' 7"  (1.702 m), weight 209 lb 6 oz (95 kg), SpO2 99 %. Body mass index is 32.79 kg/m.  Gen: Alert, well appearing.  Patient is oriented to person, place, time, and situation. AFFECT: pleasant,  lucid thought and speech. ENT: Ears: EACs clear, normal epithelium.  TMs with good light reflex and landmarks bilaterally.  Eyes: no injection, icteris, swelling, or exudate.  EOMI, PERRLA. Nose: no drainage or turbinate edema/swelling.  No injection or focal lesion.  Mouth: lips without lesion/swelling.  Oral mucosa pink and moist.  Dentition intact  and without obvious caries or gingival swelling.  Oropharynx without erythema, exudate, or swelling.  Neck: supple/nontender.  No LAD, mass, or TM.  Carotid pulses 2+ bilaterally, without bruits. CV: RRR, no m/r/g.   LUNGS: CTA bilat, nonlabored resps, good aeration in all lung fields. ABD: soft, NT, ND, BS normal.  No hepatospenomegaly or mass.  No bruits. EXT: no clubbing, cyanosis, or edema.  Musculoskeletal: no joint swelling, erythema, warmth, or tenderness.  ROM of all joints intact. Skin - no sores or suspicious lesions or rashes or color changes   Pertinent labs:  Lab Results  Component Value Date   TSH 1.96 08/17/2017   Lab Results  Component Value Date   WBC 7.8 10/05/2017   HGB 10.7 (L) 10/05/2017   HCT 33.6 (L) 10/05/2017   MCV 84.0 10/05/2017   PLT 243 10/05/2017   Lab Results  Component Value Date   CREATININE 0.80 02/24/2018   BUN 13 02/24/2018   NA 141 02/24/2018   K 4.2 02/24/2018   CL 105 02/24/2018   CO2 26 02/24/2018   Lab Results  Component Value Date   ALT 20 10/14/2017   AST 21 10/14/2017   ALKPHOS 47 02/11/2017   BILITOT 0.3 10/14/2017   Lab Results  Component Value Date   CHOL 148 02/24/2018   Lab Results  Component Value Date   HDL 48.20 02/24/2018   Lab Results  Component Value Date   LDLCALC 68 02/24/2018   Lab Results  Component Value Date   TRIG 159.0 (H) 02/24/2018   Lab Results  Component Value Date   CHOLHDL 3 02/24/2018   Lab Results  Component Value Date   HGBA1C 6.7 (A) 06/16/2018    ASSESSMENT AND PLAN:   Health maintenance exam: Reviewed age and gender appropriate health maintenance issues (prudent diet, regular exercise, health risks of tobacco and excessive alcohol, use of seatbelts, fire alarms in home, use of sunscreen).  Also reviewed age and gender appropriate health screening as well as vaccine recommendations. Vaccines: UTD but gets shingrix #2 today. Labs: cBC w/diff, CMET, TSH, FLP. Cervical ca  screening: n/a-->pt with hx of TAH for benign dx. Breast ca screening: mamm overdue-->she defers this for now. Colon ca screening: next colonoscopy due 09/2018.  An After Visit Summary was printed and given to the patient.  FOLLOW UP:  Return in about 6 months (around 02/16/2019) for routine chronic illness f/u.  Signed:  Santiago Bumpers, MD           08/18/2018

## 2018-08-18 NOTE — Addendum Note (Signed)
Addended by: Smitty Knudsen on: 08/18/2018 09:58 AM   Modules accepted: Orders

## 2018-08-28 ENCOUNTER — Encounter: Payer: Self-pay | Admitting: *Deleted

## 2018-09-01 ENCOUNTER — Other Ambulatory Visit: Payer: Self-pay | Admitting: Family Medicine

## 2018-10-02 ENCOUNTER — Other Ambulatory Visit: Payer: Self-pay | Admitting: Family Medicine

## 2018-10-02 DIAGNOSIS — E785 Hyperlipidemia, unspecified: Secondary | ICD-10-CM

## 2018-10-17 ENCOUNTER — Ambulatory Visit: Payer: PRIVATE HEALTH INSURANCE | Admitting: Internal Medicine

## 2018-11-14 ENCOUNTER — Other Ambulatory Visit: Payer: Self-pay

## 2018-11-14 NOTE — Progress Notes (Signed)
Patient ID: Raven [name], female   DOB: 1956/01/11, 63 y.o.   MRN: [id number]  HPI: Raven [name] is a 63 y.o.-year-old female, returning for f/u for DM2, dx ~1995, insulin-dependent since 11/2013, controlled, without long-term complications. Last visit 5 months ago.  Last hemoglobin A1c was: Lab Results  Component Value Date   HGBA1C 6.7 (A) 06/16/2018   HGBA1C 7.0 (A) 12/16/2017   HGBA1C 6.8 08/19/2017   She has RA >> was on Simponi (changed from Remicade) and MTX >> started Orencia and stays on MTX. On Plaquenil. She continues on prednisone 5 mg daily.  Pt is on a regimen of: - Levemir 14 units at bedtime. - Metformin ER 1500 mg daily - Januvia 100 mg in a.m. - Glipizide 5 mg before dinner-started 06/2018  She tried Glimepiride in the past >> fluctuating blood sugar.  In the past, she was checking her sugars with her CGM, several times a day, but she cannot afford it anymore (price increased from $70 to $130 a month).  She checks sugars once a day: - am:  90s-110 >> 120s >> 90-110 >> 114-147 >> 107-110 - 2h after b'fast: 160-180 >> 120-130 >> n/c - before lunch: n/c >> 110-120 >> n/c - 2h after lunch: n/c >> 140-154 >> n/c  - before dinner: n/c >> 130-150 >> 150 >> n/c - 2h after dinner: 160-180 >> n/c >> 189-236 >> 170s, 240x2 per mo - bedtime: 110-120 >> n/c   - nighttime: n/c >> 100-150 >> n/c Lowest sugar was 72 >> 80 >> 114 >> 52 (x2 - at night - did not eat regular meals then; just after starting Glipizide);  she has hypoglycemia awareness in the 70s. Highest sugar was 278 (dessert) >> 324 >> 240.  Meter: ReliOn.  Pt's meals are: - Breakfast: scrambled eggs + bacon + tomatoes - Lunch: meat + 2 veggies + no bread unless burger - Dinner: meat + 2 veggies - Snacks: 2: nuts; cheese;   -No CKD, last BUN/creatinine:  Lab Results  Component Value Date   BUN 17 08/18/2018   CREATININE 0.86 08/18/2018  On lisinopril. -+ HL; last set of lipids: Lab Results   Component Value Date   CHOL 160 08/18/2018   HDL 39.20 08/18/2018   LDLCALC 97 08/18/2018   LDLDIRECT 142.3 03/06/2008   TRIG 120.0 08/18/2018   CHOLHDL 4 08/18/2018  She was on statins but taken off b/c transaminitis in 10/2013.  Atorvastatin gave her muscle cramps.  Now, she tolerates well Crestor 10 and fish oil. - last eye exam 04/2018: No DR, + cataract>> had cataract sx's 05/2018. -She denies numbness and tingling in her feet.  ROS: Constitutional: no weight gain/no weight loss, no fatigue, no subjective hyperthermia, no subjective hypothermia Eyes: no blurry vision, no xerophthalmia ENT: no sore throat, no nodules palpated in neck, no dysphagia, no odynophagia, no hoarseness Cardiovascular: no CP/no SOB/no palpitations/no leg swelling Respiratory: no cough/no SOB/no wheezing Gastrointestinal: no N/no V/no D/no C/no acid reflux Musculoskeletal: no muscle aches/+ joint aches (pseudogout) Skin: no rashes, no hair loss Neurological: + tremors/no numbness/no tingling/no dizziness  I reviewed pt's medications, allergies, PMH, social hx, family hx, and changes were documented in the history of present illness. Otherwise, unchanged from my initial visit note.  Past Medical History:  Diagnosis Date  . Acute medial meniscus tear of right knee   . Anemia of chronic disease    Mild, Hb stable consistently  . Anxiety and depression   . Chest  pain 10/2017   +Cardiac CT.  Myocardial perfusion imaging NORMAL, EF>65%  . Diabetes mellitus 1995   Managed by Dr. Elvera LennoxGherghe (Endo)  . GERD (gastroesophageal reflux disease) per pt watches diet and take tums as needed  . H/O hiatal hernia    slightly larger ("moderate" size) on CT angio done to r/o PE 10/2017.  Marland Kitchen. Hyperlipidemia   . Hypertension   . Interstitial cystitis   . Lichen sclerosus of female genitalia   . Migraine   . Osteoarthritis   . Palpitations 10/2017   3 wk event monitor: symptoms correlate with sinus rhythm with PACs, at  times runs of PACs up to 5 beats.  . Pseudogout of knee, left    colchicine helpful  . Psoriasis   . Seasonal allergies   . Seropositive rheumatoid arthritis (HCC)    Hands, knees, jaw, elbow: responding to Simponi as of 09/29/15 rheum f/u.  Waning effectiveness on 03/2016 f/u so pt switched to Orencia injections.  Chronic low-dose prednisone therapy+ methotrexate.  . Symptomatic cholelithiasis 10/2017   Lap chole summer 2019    Past Surgical History:  Procedure Laterality Date  . ABDOMINAL HYSTERECTOMY  1991   for fibroids; ovaries still in  . BREAST BIOPSY  2001; 02/12/16   fibrocystic breast disease in 2001 and again in 02/2016 (+scar from prev bx site w/calcifications).  . CARDIOVASCULAR STRESS TEST  11/01/2017   Myocard perf imaging: NORMAL (EF >60-65%)  . COLONOSCOPY  09/12/2013   Recall 5 yrs ([facility], Dr. Evette CristalGanem) due to FH of colon polyps.  Manfred Shirts. CYSTOSCOPY  2004   for chronic UTI  . DEXA  10/2016   Bone density normal (T-score 0.0)  . ESOPHAGOGASTRODUODENOSCOPY  04/19/2011   Nl except antral gastritis: bx = mild chron gastritis (H pyloria neg)   . EVENT MONITOR  10/2017   3 wk-->Symptoms correlate with sinus rhythm with PACs, at times runs of PACs up to 5 beats.  . intraarticular steroid injection  2013   3 R knee & L X 1; Dr Netta CorriganGioffree  . KNEE ARTHROCENTESIS  2013   R knee x 3 & L X 1  . KNEE ARTHROSCOPY  10/21/2011   Procedure: ARTHROSCOPY KNEE;  Surgeon: Jacki Conesonald A Gioffre, MD;  [facility];  Service: Orthopedics;  Laterality: Right;  WITH MEDIAL and lateral shaving of femoral chondyl  with microfracture technique of lateral and medial femoral chondyl suprapatellar synovectomy  . LAPAROSCOPIC CHOLECYSTECTOMY  01/2018  . TOTAL KNEE ARTHROPLASTY  04/12/2012   Procedure: TOTAL KNEE ARTHROPLASTY;  Surgeon: Jacki Conesonald A Gioffre, MD;  Location: [facility];  Service: Orthopedics;  Laterality: Right;  Right Total Knee Arthroplasty  . TUBAL LIGATION  1981    Social  History   Socioeconomic History  . Marital status: Married    Spouse name: Not on file  . Number of children: Not on file  . Years of education: Not on file  . Highest education level: Not on file  Occupational History  . Not on file  Social Needs  . Financial resource strain: Not on file  . Food insecurity:    Worry: Not on file    Inability: Not on file  . Transportation needs:    Medical: Not on file    Non-medical: Not on file  Tobacco Use  . Smoking status: Never Smoker  . Smokeless tobacco: Never Used  Substance and Sexual Activity  . Alcohol use: No  . Drug use: No  . Sexual activity:  Not on file  Lifestyle  . Physical activity:    Days per week: Not on file    Minutes per session: Not on file  . Stress: Not on file  Relationships  . Social connections:    Talks on phone: Not on file    Gets together: Not on file    Attends religious service: Not on file    Active member of club or organization: Not on file    Attends meetings of clubs or organizations: Not on file    Relationship status: Not on file  . Intimate partner violence:    Fear of current or ex partner: Not on file    Emotionally abused: Not on file    Physically abused: Not on file    Forced sexual activity: Not on file  Other Topics Concern  . Not on file  Social History Narrative   Married, 3 children (2 in [place], 1 in [place]).  4 grandchildren.   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)   No tob/alc/drugs.   2 cups of coffee/day x 3 months   Regular exercise- no-due to arthritis.   Religion affecting care, "it allows stress management:"    Current Outpatient Medications on File Prior to Visit  Medication Sig Dispense Refill  . Abatacept (ORENCIA) 50 MG/0.4ML SOSY Inject 1 Dose into the skin once a week.    . cetirizine (ZYRTEC) 10 MG tablet Take 10 mg by mouth every evening.     . colchicine 0.6 MG tablet Take 1 tablet by mouth 2 (two) times daily.    . cyclobenzaprine  (FLEXERIL) 5 MG tablet Take 5 mg by mouth 3 (three) times daily as needed for muscle spasms. Take 1 1/2 tablets 3 times daily    . diazepam (VALIUM) 5 MG tablet Take 1 tablet (5 mg total) by mouth every 12 (twelve) hours as needed. Migraine headaches 60 tablet 5  . FOLIC ACID PO Take 1 mg by mouth daily.     Marland Kitchen glipiZIDE (GLUCOTROL) 5 MG tablet Take 1 tablet (5 mg total) by mouth daily before supper. 90 tablet 3  . Insulin Detemir (LEVEMIR FLEXTOUCH) 100 UNIT/ML Pen INJECT 14 UNITS INTO THE SKIN DAILY AT 10PM 15 mL 11  . lisinopril (PRINIVIL,ZESTRIL) 20 MG tablet Take 0.5 tablets (10 mg total) by mouth daily. 45 tablet 3  . metFORMIN (GLUCOPHAGE-XR) 500 MG 24 hr tablet Take 2 tablets (1,000 mg total) by mouth 2 (two) times daily with a meal. 360 tablet 3  . Methotrexate Sodium (METHOTREXATE PO) Inject 1 mg as directed every Wednesday.     . Omega-3 Fatty Acids (FISH OIL) 1000 MG CAPS Take by mouth 2 (two) times daily.    . predniSONE (DELTASONE) 5 MG tablet Take 5 mg by mouth daily.    . primidone (MYSOLINE) 50 MG tablet Take 50 mg by mouth. 3 by mouth at bedtime    . rosuvastatin (CRESTOR) 10 MG tablet TAKE ONE TABLET BY MOUTH EVERY DAY 90 tablet 2  . sertraline (ZOLOFT) 100 MG tablet TAKE 1 AND 1/2 TABLETS BY MOUTH EVERY DAY 45 tablet 3  . sitaGLIPtin (JANUVIA) 100 MG tablet Take 1 tablet (100 mg total) by mouth daily. 30 tablet 11  . vitamin E (VITAMIN E) 400 UNIT capsule Take 400 Units by mouth daily.     No current facility-administered medications on file prior to visit.     Allergies  Allergen Reactions  . Penicillins Rash  . Pravastatin  Myalgias  . Simvastatin     Myalgias  . Hydrocodone-Acetaminophen Nausea And Vomiting    Family History  Problem Relation Age of Onset  . Diabetes Mother   . Stroke Mother 51  . Osteoarthritis Mother   . Stroke Brother 24  . Heart disease Father        CABG  . Lung cancer Father   . Prostate cancer Father   . Pancreatic cancer Father    . Pancreatic cancer Paternal Grandfather   . Bipolar disorder Daughter   . Anxiety disorder Son   . Arthritis Maternal Grandmother        rheumatoid  . Heart attack Brother 60       smoker    PE: BP 110/78   Pulse 75   Temp 98.2 F (36.8 C)   Ht 5\' 8"  (1.727 m)   Wt 208 lb (94.3 kg)   SpO2 98%   BMI 31.63 kg/m  Body mass index is 31.63 kg/m.  Wt Readings from Last 3 Encounters:  11/15/18 208 lb (94.3 kg)  08/18/18 209 lb 6 oz (95 kg)  07/14/18 208 lb 8 oz (94.6 kg)   Constitutional: overweight, in NAD Eyes: PERRLA, EOMI, no exophthalmos ENT: moist mucous membranes, no thyromegaly, no cervical lymphadenopathy Cardiovascular: RRR, No MRG Respiratory: CTA B Gastrointestinal: abdomen soft, NT, ND, BS+ Musculoskeletal: no deformities, strength intact in all 4 Skin: moist, warm, no rashes Neurological: + tremor with outstretched hands, DTR normal   ASSESSMENT: 1. DM2, insulin-dependent, controlled, without long term complications, but with Hgly  2. HL  3.  Obesity class I  PLAN:  1. Patient with longstanding, previously uncontrolled diabetes, with better control on low-dose basal insulin and oral medications.  She was having diarrhea in the past with regular metformin formulations.  This improved switching to ER formulations of metformin, but she still has some loose stools.  She takes 1 tablet 2-3 times a day depending on her symptoms. -She was on the freestyle libre CGM in the past but cannot afford it anymore. -At last visit her sugars appear tired due to increased join pain and pharyngitis (on antibiotics).  They started to improve, though and an HbA1c was actually better, at 6.7%.  Says sugars were higher after dinner we added a low-dose glipizide before this meal.  We also discussed at that visit about the possibility of trying the GLP-1 receptor agonist instead Januvia.  She wanted to defer this until after the first of the year. -At this visit, her sugars are at  goal in the morning, improved compared to before, but she occasionally has low blood sugars at night due to glipizide.  She is taking 5 mg of glipizide before each dinner, with occasional still high sugars in the 240s after dinner.  At this visit, we again discussed about trying a GLP-1 receptor agonist and she agrees to do so.  I sent Ozempic to her pharmacy, explained the mechanism of action and possible side effects.  I also explained benefits in terms of weight loss and cardiovascular risk reduction.  We will start at a low dose and advance as tolerated.  Given coupon card. - I suggested to:  Patient Instructions  Please continue: - Levemir 14 units at bedtime. - Metformin ER 1500 mg daily  Stop: - Januvia - Glipizide   Please start Ozempic 0.25 mg weekly in a.m. (for example on Sunday morning) x 4 weeks, then increase to 0.5 mg weekly in a.m. if no nausea or  hypoglycemia.  Please come back for a follow-up appointment in 4 months.  - today, HbA1c is 6.8% (slightly higher) - continue checking sugars at different times of the day - check 1-2x a day, rotating checks - advised for yearly eye exams >> she is UTD - Return to clinic in 4 mo with sugar log      2. HL - Reviewed latest lipid panel from 08/2018: LDL more than 100 and the rest of the fractions are at goal, also Lab Results  Component Value Date   CHOL 160 08/18/2018   HDL 39.20 08/18/2018   LDLCALC 97 08/18/2018   LDLDIRECT 142.3 03/06/2008   TRIG 120.0 08/18/2018   CHOLHDL 4 08/18/2018  - Continues Crestor and fish oil without side effects.  3.  Obesity class I -Weight stable since last visit -Continue metformin which is weight neutral -I would like to change her Januvia to a GLP-1 receptor agonist -hopefully covered by her insurance.  We will also stop glipizide which can be weight inducing  Raven Pavlovristina Magdelena Kinsella, MD PhD [facility]

## 2018-11-14 NOTE — Patient Instructions (Addendum)
Please continue: - Levemir 14 units at bedtime. - Metformin ER 1500 mg daily  Stop: - Januvia - Glipizide   Please start Ozempic 0.25 mg weekly in a.m. (for example on Sunday morning) x 4 weeks, then increase to 0.5 mg weekly in a.m. if no nausea or hypoglycemia.  Please come back for a follow-up appointment in 4 months.

## 2018-11-15 ENCOUNTER — Ambulatory Visit: Payer: 59 | Admitting: Internal Medicine

## 2018-11-15 ENCOUNTER — Encounter: Payer: Self-pay | Admitting: Internal Medicine

## 2018-11-15 ENCOUNTER — Telehealth: Payer: Self-pay

## 2018-11-15 VITALS — BP 110/78 | HR 75 | Temp 98.2°F | Ht 68.0 in | Wt 208.0 lb

## 2018-11-15 DIAGNOSIS — E669 Obesity, unspecified: Secondary | ICD-10-CM | POA: Diagnosis not present

## 2018-11-15 DIAGNOSIS — Z794 Long term (current) use of insulin: Secondary | ICD-10-CM | POA: Diagnosis not present

## 2018-11-15 DIAGNOSIS — E785 Hyperlipidemia, unspecified: Secondary | ICD-10-CM

## 2018-11-15 DIAGNOSIS — E119 Type 2 diabetes mellitus without complications: Secondary | ICD-10-CM | POA: Diagnosis not present

## 2018-11-15 LAB — POCT GLYCOSYLATED HEMOGLOBIN (HGB A1C): Hemoglobin A1C: 6.8 % — AB (ref 4.0–5.6)

## 2018-11-15 MED ORDER — SEMAGLUTIDE(0.25 OR 0.5MG/DOS) 2 MG/1.5ML ~~LOC~~ SOPN: 0.5000 mg | PEN_INJECTOR | SUBCUTANEOUS | 5 refills | Status: DC

## 2018-11-15 NOTE — Telephone Encounter (Signed)
PA for Ozempic has been sent.

## 2018-11-23 LAB — CBC AND DIFFERENTIAL
HCT: 34 — AB (ref 36–46)
Hemoglobin: 11.6 — AB (ref 12.0–16.0)
Neutrophils Absolute: 5
Platelets: 262 (ref 150–399)
WBC: 8

## 2018-11-23 LAB — BASIC METABOLIC PANEL
BUN: 13 (ref 4–21)
Creatinine: 0.8 (ref 0.5–1.1)
Potassium: 4.8 (ref 3.4–5.3)
Sodium: 137 (ref 137–147)

## 2018-11-23 LAB — HEPATIC FUNCTION PANEL
ALT: 52 — AB (ref 7–35)
AST: 36 — AB (ref 13–35)
Alkaline Phosphatase: 52 (ref 25–125)
Bilirubin, Total: 0.2

## 2018-11-30 ENCOUNTER — Telehealth: Payer: Self-pay

## 2018-11-30 NOTE — Telephone Encounter (Signed)
That's crazy!  Let's try a PA.

## 2018-11-30 NOTE — Telephone Encounter (Signed)
PA for Ozempic has been denied by insurance for the following reasons:  A1c within the last 3 months must be greater than or equal to 8.5.  Letter sent to scan.

## 2018-12-01 NOTE — Telephone Encounter (Signed)
I will give you something to attach - we should resend the PA.

## 2018-12-01 NOTE — Telephone Encounter (Signed)
I already did the PA and it was denied due to the reason listed :  A1c within the last 3 months must be greater than or equal to 8.5.A1c   So they won't cover it.

## 2018-12-05 NOTE — Telephone Encounter (Signed)
PA resent with additional document.

## 2018-12-11 ENCOUNTER — Telehealth: Payer: Self-pay

## 2018-12-11 ENCOUNTER — Other Ambulatory Visit: Payer: Self-pay

## 2018-12-11 MED ORDER — SEMAGLUTIDE(0.25 OR 0.5MG/DOS) 2 MG/1.5ML ~~LOC~~ SOPN: 0.5000 mg | PEN_INJECTOR | SUBCUTANEOUS | 5 refills | Status: DC

## 2018-12-11 NOTE — Telephone Encounter (Signed)
Appeal for the Ozempic has been approved.  Approval period is 12/06/2018 to 12/05/2019

## 2018-12-12 ENCOUNTER — Other Ambulatory Visit: Payer: Self-pay | Admitting: Family Medicine

## 2018-12-12 DIAGNOSIS — I10 Essential (primary) hypertension: Secondary | ICD-10-CM

## 2019-01-24 ENCOUNTER — Other Ambulatory Visit: Payer: Self-pay | Admitting: Family Medicine

## 2019-01-24 LAB — HEPATIC FUNCTION PANEL
ALT: 51 — AB (ref 7–35)
AST: 37 — AB (ref 13–35)
Alkaline Phosphatase: 47 (ref 25–125)
Bilirubin, Total: 0.3

## 2019-01-24 LAB — CBC AND DIFFERENTIAL
HCT: 36 (ref 36–46)
Hemoglobin: 12.1 (ref 12.0–16.0)
Neutrophils Absolute: 7
Platelets: 234 (ref 150–399)
WBC: 11

## 2019-01-24 LAB — BASIC METABOLIC PANEL
BUN: 15 (ref 4–21)
Creatinine: 0.9 (ref 0.5–1.1)
Potassium: 4.6 (ref 3.4–5.3)
Sodium: 141 (ref 137–147)

## 2019-01-25 ENCOUNTER — Encounter: Payer: Self-pay | Admitting: Family Medicine

## 2019-02-16 ENCOUNTER — Encounter: Payer: Self-pay | Admitting: Family Medicine

## 2019-02-16 ENCOUNTER — Ambulatory Visit (INDEPENDENT_AMBULATORY_CARE_PROVIDER_SITE_OTHER): Payer: 59 | Admitting: Family Medicine

## 2019-02-16 ENCOUNTER — Other Ambulatory Visit: Payer: Self-pay

## 2019-02-16 VITALS — BP 92/69 | HR 85 | Temp 98.0°F | Resp 16 | Ht 68.0 in | Wt 193.6 lb

## 2019-02-16 DIAGNOSIS — E7800 Pure hypercholesterolemia, unspecified: Secondary | ICD-10-CM

## 2019-02-16 DIAGNOSIS — I10 Essential (primary) hypertension: Secondary | ICD-10-CM | POA: Diagnosis not present

## 2019-02-16 DIAGNOSIS — F330 Major depressive disorder, recurrent, mild: Secondary | ICD-10-CM | POA: Diagnosis not present

## 2019-02-16 NOTE — Progress Notes (Signed)
OFFICE VISIT  02/16/2019   CC:  Chief Complaint  Patient presents with  . Follow-up    RCI, pt is fasting   HPI:    Patient is a 63 y.o. Caucasian female who presents for 6 mo f/u HTN, HLD, recurrent MDD.  She has DM 2, managed by Dr. Joseph Art. Has seropos RA managed by rheum.  HTN: occ home monitoring shows normal every time. Taking 1/2 lisinopril 20mg  qd.  HLD: taking crestor daily w/out problem.  MDD and anxiety:  Taking valium for stress-induced migraines.  Uses this med irregularly. Due to the stress and changes/adjustments assoc with covid pandemic and quarantine she is feeling an increased amount of depressed mood, hopelessness, and very poor motivation.  No SI or HI.  She knows it is secondary to the situation these days and feels like possibly a 1 week vacation at the beach she has planned may help turn things around for her.  She wants to hold off on any change/addition of depression/anxiety med at this time.  ROS: no CP, no SOB, no wheezing, no cough, no dizziness, no HAs, no rashes, no melena/hematochezia.  No polyuria or polydipsia.  No myalgias or arthralgias.    Past Medical History:  Diagnosis Date  . Acute medial meniscus tear of right knee   . Anemia of chronic disease    Mild, Hb stable consistently  . Anxiety and depression   . Chest pain 10/2017   +Cardiac CT.  Myocardial perfusion imaging NORMAL, EF>65%  . Diabetes mellitus 1995   Managed by Dr. Cruzita Lederer (Endo)  . GERD (gastroesophageal reflux disease) per pt watches diet and take tums as needed  . H/O hiatal hernia    slightly larger ("moderate" size) on CT angio done to r/o PE 10/2017.  Marland Kitchen Hyperlipidemia   . Hypertension   . Interstitial cystitis   . Lichen sclerosus of female genitalia   . Migraine   . Osteoarthritis   . Palpitations 10/2017   3 wk event monitor: symptoms correlate with sinus rhythm with PACs, at times runs of PACs up to 5 beats.  . Pseudogout of knee, left    colchicine  helpful  . Psoriasis   . Seasonal allergies   . Seropositive rheumatoid arthritis (HCC)    Hands, knees, jaw, elbow: responding to Simponi as of 09/29/15 rheum f/u.  Waning effectiveness on 03/2016 f/u so pt switched to Orencia injections.  Chronic low-dose prednisone therapy+ methotrexate +orencia as of 01/2019.  Marland Kitchen Symptomatic cholelithiasis 10/2017   Lap chole summer 2019    Past Surgical History:  Procedure Laterality Date  . ABDOMINAL HYSTERECTOMY  1991   for fibroids; ovaries still in  . BREAST BIOPSY  2001; 02/12/16   fibrocystic breast disease in 2001 and again in 02/2016 (+scar from prev bx site w/calcifications).  . CARDIOVASCULAR STRESS TEST  11/01/2017   Myocard perf imaging: NORMAL (EF >60-65%)  . COLONOSCOPY  09/12/2013   Recall 5 yrs ([facility], Dr. Penelope Coop) due to [place] of colon polyps.  Abigail Butts   for chronic UTI  . DEXA  10/2016   Bone density normal (T-score 0.0)  . ESOPHAGOGASTRODUODENOSCOPY  04/19/2011   Nl except antral gastritis: bx = mild chron gastritis (H pyloria neg)   . EVENT MONITOR  10/2017   3 wk-->Symptoms correlate with sinus rhythm with PACs, at times runs of PACs up to 5 beats.  . intraarticular steroid injection  2013   3 R knee & L X 1; Dr  Gioffree  . KNEE ARTHROCENTESIS  2013   R knee x 3 & L X 1  . KNEE ARTHROSCOPY  10/21/2011   Procedure: ARTHROSCOPY KNEE;  Surgeon: Jacki Cones, MD;  [facility]: [facility];  Service: Orthopedics;  Laterality: Right;  WITH MEDIAL and lateral shaving of femoral chondyl  with microfracture technique of lateral and medial femoral chondyl suprapatellar synovectomy  . LAPAROSCOPIC CHOLECYSTECTOMY  01/2018  . TOTAL KNEE ARTHROPLASTY  04/12/2012   Procedure: TOTAL KNEE ARTHROPLASTY;  Surgeon: Jacki Cones, MD;  Location: [facility];  Service: Orthopedics;  Laterality: Right;  Right Total Knee Arthroplasty  . TUBAL LIGATION  1981    Outpatient Medications Prior to Visit  Medication Sig Dispense  Refill  . Abatacept (ORENCIA) 50 MG/0.4ML SOSY Inject 1 Dose into the skin once a week.    . cetirizine (ZYRTEC) 10 MG tablet Take 10 mg by mouth every evening.     . Cholecalciferol (VITAMIN D3) 1.25 MG (50000 UT) CAPS Take by mouth daily.    . colchicine 0.6 MG tablet Take 1 tablet by mouth 2 (two) times daily.    . cyclobenzaprine (FLEXERIL) 5 MG tablet Take 5 mg by mouth 3 (three) times daily as needed for muscle spasms. Take 1 1/2 tablets 3 times daily    . diazepam (VALIUM) 5 MG tablet Take 1 tablet (5 mg total) by mouth every 12 (twelve) hours as needed. Migraine headaches 60 tablet 5  . FOLIC ACID PO Take 1 mg by mouth daily.     . Insulin Detemir (LEVEMIR FLEXTOUCH) 100 UNIT/ML Pen INJECT 14 UNITS INTO THE SKIN DAILY AT 10PM 15 mL 11  . lisinopril (ZESTRIL) 20 MG tablet TAKE 1/2 TABLET BY MOUTH EVERY DAY 45 tablet 2  . metFORMIN (GLUCOPHAGE-XR) 500 MG 24 hr tablet Take 2 tablets (1,000 mg total) by mouth 2 (two) times daily with a meal. 360 tablet 3  . Methotrexate Sodium (METHOTREXATE PO) Inject 1 mg as directed every Wednesday.     . Omega-3 Fatty Acids (FISH OIL) 1000 MG CAPS Take by mouth 2 (two) times daily.    . predniSONE (DELTASONE) 5 MG tablet Take 5 mg by mouth daily.    . primidone (MYSOLINE) 50 MG tablet Take 50 mg by mouth. 3 by mouth at bedtime    . rosuvastatin (CRESTOR) 10 MG tablet TAKE ONE TABLET BY MOUTH EVERY DAY 90 tablet 2  . Semaglutide,0.25 or 0.5MG /DOS, (OZEMPIC, 0.25 OR 0.5 MG/DOSE,) 2 MG/1.5ML SOPN Inject 0.5 mg into the skin once a week. 2 pen 5  . sertraline (ZOLOFT) 100 MG tablet TAKE 1 AND 1/2 TABLETS BY MOUTH EVERY DAY 45 tablet 3  . vitamin E (VITAMIN E) 400 UNIT capsule Take 400 Units by mouth daily.     No facility-administered medications prior to visit.     Allergies  Allergen Reactions  . Penicillins Rash  . Pravastatin     Myalgias  . Simvastatin     Myalgias  . Hydrocodone-Acetaminophen Nausea And Vomiting    ROS As per  HPI  PE: Blood pressure 92/69, pulse 85, temperature 98 F (36.7 C), temperature source Temporal, resp. rate 16, height 5\' 8"  (1.727 m), weight 193 lb 9.6 oz (87.8 kg), SpO2 95 %. Gen: Alert, well appearing.  Patient is oriented to person, place, time, and situation. AFFECT: pleasant, lucid thought and speech. CV: RRR, no m/r/g.   LUNGS: CTA bilat, nonlabored resps, good aeration in all lung fields. EXT: no clubbing  or cyanosis.  no edema.    LABS:    Chemistry      Component Value Date/Time   NA 141 08/18/2018 0833   K 4.3 08/18/2018 0833   CL 106 08/18/2018 0833   CO2 23 08/18/2018 0833   BUN 17 08/18/2018 0833   CREATININE 0.86 08/18/2018 0833   CREATININE 0.82 10/14/2017 1524      Component Value Date/Time   CALCIUM 8.5 08/18/2018 0833   ALKPHOS 46 08/18/2018 0833   AST 33 08/18/2018 0833   ALT 44 (H) 08/18/2018 0833   BILITOT 0.3 08/18/2018 0833     Lab Results  Component Value Date   CHOL 160 08/18/2018   HDL 39.20 08/18/2018   LDLCALC 97 08/18/2018   LDLDIRECT 142.3 03/06/2008   TRIG 120.0 08/18/2018   CHOLHDL 4 08/18/2018   Lab Results  Component Value Date   WBC 7.8 08/18/2018   HGB 11.3 (L) 08/18/2018   HCT 35.4 (L) 08/18/2018   MCV 82.2 08/18/2018   PLT 246.0 08/18/2018   Lab Results  Component Value Date   HGBA1C 6.8 (A) 11/15/2018   Lab Results  Component Value Date   TSH 3.72 08/18/2018   IMPRESSION AND PLAN:  1) HTN: The current medical regimen is effective;  continue present plan and medications. Will obtain most recent labs from her rheum done approx 2 wks ago.  2) HLD: tolerating statin well.  Lipid panel 08/2018 very good.  Plan repeat lipids at f/u in 6 mo.  Will get hepatic panel results from rheum done a couple of weeks ago.  3) MDD, struggling some lately.  She does not want to adjust sertraline or change/added antidepressant at this time.  If her upcoming vacation does not help, though, she is to call or return for discussion of  this.  An After Visit Summary was printed and given to the patient.  FOLLOW UP: Return in about 6 months (around 08/19/2019) for annual CPE (fasting).  Signed:  Santiago BumpersPhil Levern Pitter, MD           02/16/2019

## 2019-02-21 ENCOUNTER — Encounter: Payer: Self-pay | Admitting: Family Medicine

## 2019-03-15 ENCOUNTER — Encounter: Payer: Self-pay | Admitting: Family Medicine

## 2019-03-16 ENCOUNTER — Other Ambulatory Visit: Payer: Self-pay

## 2019-03-21 ENCOUNTER — Other Ambulatory Visit: Payer: Self-pay

## 2019-03-21 ENCOUNTER — Ambulatory Visit (INDEPENDENT_AMBULATORY_CARE_PROVIDER_SITE_OTHER): Payer: 59 | Admitting: Internal Medicine

## 2019-03-21 ENCOUNTER — Encounter: Payer: Self-pay | Admitting: Internal Medicine

## 2019-03-21 VITALS — BP 148/90 | HR 82 | Ht 68.0 in | Wt 187.0 lb

## 2019-03-21 DIAGNOSIS — E669 Obesity, unspecified: Secondary | ICD-10-CM

## 2019-03-21 DIAGNOSIS — E119 Type 2 diabetes mellitus without complications: Secondary | ICD-10-CM

## 2019-03-21 DIAGNOSIS — Z794 Long term (current) use of insulin: Secondary | ICD-10-CM | POA: Diagnosis not present

## 2019-03-21 DIAGNOSIS — E785 Hyperlipidemia, unspecified: Secondary | ICD-10-CM

## 2019-03-21 LAB — POCT GLYCOSYLATED HEMOGLOBIN (HGB A1C): Hemoglobin A1C: 5.9 % — AB (ref 4.0–5.6)

## 2019-03-21 MED ORDER — LEVEMIR FLEXTOUCH 100 UNIT/ML ~~LOC~~ SOPN: PEN_INJECTOR | SUBCUTANEOUS | 11 refills | Status: DC

## 2019-03-21 MED ORDER — METFORMIN HCL ER 500 MG PO TB24: 500.0000 mg | ORAL_TABLET | Freq: Two times a day (BID) | ORAL | 3 refills | Status: DC

## 2019-03-21 NOTE — Patient Instructions (Signed)
Please decrease: - Levemir 10 units at bedtime - Metformin ER 1000 mg daily  Continue: - Ozempic 0.5 mg weekly  Please come back for a follow-up appointment in 4 months.

## 2019-03-21 NOTE — Progress Notes (Signed)
Patient ID: Raven [name], female   DOB: 03-14-1956, 63 y.o.   MRN: [id number]  HPI: Raven [name] is a 63 y.o.-year-old female, returning for f/u for DM2, dx ~1995, insulin-dependent since 11/2013, controlled, without long-term complications. Last visit 4 months ago.  Last hemoglobin A1c was: Lab Results  Component Value Date   HGBA1C 6.8 (A) 11/15/2018   HGBA1C 6.7 (A) 06/16/2018   HGBA1C 7.0 (A) 12/16/2017   She has RA >> was on Simponi (changed from Remicade) and MTX >> started Orencia and stays on MTX. On Plaquenil.  She continues on prednisone 5 mg daily  Pt is on a regimen of: - Metformin ER 1500mg  daily - Ozempic 0.5 mg weekly - started 12/2018 -she initially had nausea with it, now resolved - Levemir 14 units at bedtime. She tried Glimepiride in the past >> fluctuating blood sugar. We stopped glipizide and Januvia only started Ozempic 12/2018.  In the past, she was checking her sugars with her CGM, several times a day, but she cannot afford it anymore (price increased from $70 to $130 a month).  She checks sugars once a day: - am:90-110 >> 114-147 >> 107-110 >> 87-110 - 2h after b'fast: 160-180 >> 120-130 >> n/c - before lunch: n/c >> 110-120 >> n/c - 2h after lunch: n/c >> 140-154 >> n/c  - before dinner: 130-150 >> 150 >> n/c - 2h after dinner: 189-236 >> 170s, 240x2 per mo >> 150-160, 183 - bedtime: 110-120 >> n/c   - nighttime: n/c >> 100-150 >> n/c Lowest sugar was 52 (x2 - at night - did not eat regular meals then; just after starting Glipizide) >> 87;  she has hypoglycemia awareness in the 70s. Highest sugar was 324 >> 240 >> 183.  Meter: ReliOn.  Pt's meals are: - Breakfast: scrambled eggs + bacon + tomatoes - Lunch: meat + 2 veggies + no bread unless burger - Dinner: meat + 2 veggies - Snacks: 2: nuts; cheese;   -No CKD, last BUN/creatinine:  Lab Results  Component Value Date   BUN 15 01/24/2019   CREATININE 0.9 01/24/2019  On lisinopril. -+  HL; last set of lipids: Lab Results  Component Value Date   CHOL 160 08/18/2018   HDL 39.20 08/18/2018   LDLCALC 97 08/18/2018   LDLDIRECT 142.3 03/06/2008   TRIG 120.0 08/18/2018   CHOLHDL 4 08/18/2018  She was on statins but taken off b/c transaminitis in 10/2013.  Atorvastatin gave her muscle cramps. she now tolerates well Crestor 10 and fish oil. - last eye exam 04/2018: No DR, + cataract >> had cataract sx's 05/2018. - no numbness and tingling in her feet.  ROS: Constitutional: no weight gain/+ weight loss, no fatigue, no subjective hyperthermia, no subjective hypothermia Eyes: no blurry vision, no xerophthalmia ENT: no sore throat, no nodules palpated in neck, no dysphagia, no odynophagia, no hoarseness Cardiovascular: no CP/no SOB/no palpitations/no leg swelling Respiratory: no cough/no SOB/no wheezing Gastrointestinal: + resolved N/no V/no D/no C/no acid reflux Musculoskeletal: no muscle aches/+ joint aches (pseudogout) Skin: no rashes, no hair loss Neurological: no tremors/no numbness/no tingling/no dizziness  I reviewed pt's medications, allergies, PMH, social hx, family hx, and changes were documented in the history of present illness. Otherwise, unchanged from my initial visit note.  Past Medical History:  Diagnosis Date  . Acute medial meniscus tear of right knee   . Anemia of chronic disease    Mild, Hb stable consistently  . Anxiety and depression   .  Chest pain 10/2017   +Cardiac CT.  Myocardial perfusion imaging NORMAL, EF>65%  . Diabetes mellitus 1995   Managed by Dr. Cruzita Lederer (Endo)  . GERD (gastroesophageal reflux disease) per pt watches diet and take tums as needed  . H/O hiatal hernia    slightly larger ("moderate" size) on CT angio done to r/o PE 10/2017.  Marland Kitchen Hyperlipidemia   . Hypertension   . Interstitial cystitis   . Lichen sclerosus of female genitalia   . Migraine   . Osteoarthritis   . Palpitations 10/2017   3 wk event monitor: symptoms  correlate with sinus rhythm with PACs, at times runs of PACs up to 5 beats.  . Pseudogout of knee, left    colchicine helpful  . Psoriasis   . Seasonal allergies   . Seropositive rheumatoid arthritis (HCC)    Hands, knees, jaw, elbow: responding to Simponi as of 09/29/15 rheum f/u.  Raven [name] on 03/2016 f/u so pt switched to Orencia injections.  Doing well on chronic low-dose prednisone therapy+ methotrexate +orencia as of 01/2019.  Marland Kitchen Symptomatic cholelithiasis 10/2017   Lap chole summer 2019    Past Surgical History:  Procedure Laterality Date  . ABDOMINAL HYSTERECTOMY  1991   for fibroids; ovaries still in  . BREAST BIOPSY  2001; 02/12/16   fibrocystic breast disease in 2001 and again in 02/2016 (+scar from prev bx site w/calcifications).  . CARDIOVASCULAR STRESS TEST  11/01/2017   Myocard perf imaging: NORMAL (EF >60-65%)  . COLONOSCOPY  09/12/2013   Recall 5 yrs ([facility], Dr. Penelope Coop) due to [place] of colon polyps.  Abigail Butts   for chronic UTI  . DEXA  10/2016   Bone density normal (T-score 0.0)  . ESOPHAGOGASTRODUODENOSCOPY  04/19/2011   Nl except antral gastritis: bx = mild chron gastritis (H pyloria neg)   . EVENT MONITOR  10/2017   3 wk-->Symptoms correlate with sinus rhythm with PACs, at times runs of PACs up to 5 beats.  . intraarticular steroid injection  2013   3 R knee & L X 1; Dr Rushie Nyhan  . KNEE ARTHROCENTESIS  2013   R knee x 3 & L X 1  . KNEE ARTHROSCOPY  10/21/2011   Procedure: ARTHROSCOPY KNEE;  Surgeon: Tobi Bastos, MD;  Location: [facility];  Service: Orthopedics;  Laterality: Right;  WITH MEDIAL and lateral shaving of femoral chondyl  with microfracture technique of lateral and medial femoral chondyl suprapatellar synovectomy  . LAPAROSCOPIC CHOLECYSTECTOMY  01/2018  . TOTAL KNEE ARTHROPLASTY  04/12/2012   Procedure: TOTAL KNEE ARTHROPLASTY;  Surgeon: Tobi Bastos, MD;  Location: [facility];  Service: Orthopedics;  Laterality:  Right;  Right Total Knee Arthroplasty  . TUBAL LIGATION  1981    Social History   Socioeconomic History  . Marital status: Married    Spouse name: Not on file  . Number of children: Not on file  . Years of education: Not on file  . Highest education level: Not on file  Occupational History  . Not on file  Social Needs  . Financial resource strain: Not on file  . Food insecurity    Worry: Not on file    Inability: Not on file  . Transportation needs    Medical: Not on file    Non-medical: Not on file  Tobacco Use  . Smoking status: Never Smoker  . Smokeless tobacco: Never Used  Substance and Sexual Activity  . Alcohol use: No  .  Drug use: No  . Sexual activity: Not on file  Lifestyle  . Physical activity    Days per week: Not on file    Minutes per session: Not on file  . Stress: Not on file  Relationships  . Social Musicianconnections    Talks on phone: Not on file    Gets together: Not on file    Attends religious service: Not on file    Active member of club or organization: Not on file    Attends meetings of clubs or organizations: Not on file    Relationship status: Not on file  . Intimate partner violence    Fear of current or ex partner: Not on file    Emotionally abused: Not on file    Physically abused: Not on file    Forced sexual activity: Not on file  Other Topics Concern  . Not on file  Social History Narrative   Married, 3 children (2 in [place], 1 in [place]  4 grandchildren.   Occupation: Education officer, environmentalastor in [employer]   No tob/alc/drugs.   2 cups of coffee/day x 3 months   Regular exercise- no-due to arthritis.   Religion affecting care, "it allows stress management:"    Current Outpatient Medications on File Prior to Visit  Medication Sig Dispense Refill  . Abatacept (ORENCIA) 50 MG/0.4ML SOSY Inject 1 Dose into the skin once a week.    . cetirizine (ZYRTEC) 10 MG tablet Take 10 mg by mouth every evening.     . Cholecalciferol (VITAMIN D3)  1.25 MG (50000 UT) CAPS Take by mouth daily.    . colchicine 0.6 MG tablet Take 1 tablet by mouth 2 (two) times daily.    . cyclobenzaprine (FLEXERIL) 5 MG tablet Take 5 mg by mouth 3 (three) times daily as needed for muscle spasms. Take 1 1/2 tablets 3 times daily    . diazepam (VALIUM) 5 MG tablet Take 1 tablet (5 mg total) by mouth every 12 (twelve) hours as needed. Migraine headaches 60 tablet 5  . FOLIC ACID PO Take 1 mg by mouth daily.     . Insulin Detemir (LEVEMIR FLEXTOUCH) 100 UNIT/ML Pen INJECT 14 UNITS INTO THE SKIN DAILY AT 10PM 15 mL 11  . lisinopril (ZESTRIL) 20 MG tablet TAKE 1/2 TABLET BY MOUTH EVERY DAY 45 tablet 2  . metFORMIN (GLUCOPHAGE-XR) 500 MG 24 hr tablet Take 2 tablets (1,000 mg total) by mouth 2 (two) times daily with a meal. 360 tablet 3  . Methotrexate Sodium (METHOTREXATE PO) Inject 1 mg as directed every Wednesday.     . Omega-3 Fatty Acids (FISH OIL) 1000 MG CAPS Take by mouth 2 (two) times daily.    . predniSONE (DELTASONE) 5 MG tablet Take 5 mg by mouth daily.    . primidone (MYSOLINE) 50 MG tablet Take 50 mg by mouth. 3 by mouth at bedtime    . rosuvastatin (CRESTOR) 10 MG tablet TAKE ONE TABLET BY MOUTH EVERY DAY 90 tablet 2  . Semaglutide,0.25 or 0.5MG /DOS, (OZEMPIC, 0.25 OR 0.5 MG/DOSE,) 2 MG/1.5ML SOPN Inject 0.5 mg into the skin once a week. 2 pen 5  . sertraline (ZOLOFT) 100 MG tablet TAKE 1 AND 1/2 TABLETS BY MOUTH EVERY DAY 45 tablet 3   No current facility-administered medications on file prior to visit.     Allergies  Allergen Reactions  . Penicillins Rash  . Pravastatin     Myalgias  . Simvastatin     Myalgias  .  Hydrocodone-Acetaminophen Nausea And Vomiting    Family History  Problem Relation Age of Onset  . Diabetes Mother   . Stroke Mother 47  . Osteoarthritis Mother   . Stroke Brother 47  . Heart disease Father        CABG  . Lung cancer Father   . Prostate cancer Father   . Pancreatic cancer Father   . Pancreatic cancer  Paternal Grandfather   . Bipolar disorder Daughter   . Anxiety disorder Son   . Arthritis Maternal Grandmother        rheumatoid  . Heart attack Brother 71       smoker    PE: BP (!) 148/90   Pulse 82   Ht 5\' 8"  (1.727 m)   Wt 187 lb (84.8 kg)   SpO2 98%   BMI 28.43 kg/m  Body mass index is 28.43 kg/m.  Wt Readings from Last 3 Encounters:  03/21/19 187 lb (84.8 kg)  02/16/19 193 lb 9.6 oz (87.8 kg)  11/15/18 208 lb (94.3 kg)   Constitutional: overweight, in NAD Eyes: PERRLA, EOMI, no exophthalmos ENT: moist mucous membranes, no thyromegaly, no cervical lymphadenopathy Cardiovascular: RRR, No MRG Respiratory: CTA B Gastrointestinal: abdomen soft, NT, ND, BS+ Musculoskeletal: no deformities, strength intact in all 4 Skin: moist, warm, no rashes Neurological: + tremor with outstretched hands, DTR normal in all 4  ASSESSMENT: 1. DM2, insulin-dependent, controlled, without long term complications, but with Hgly -She was on the freestyle libre CGM in the past but cannot afford it anymore.  2. HL  3.  Obesity class I  PLAN:  1. Patient with longstanding, previously uncontrolled diabetes, with better control on low-dose basal insulin, and oral medications.  At last visit, since her sugars were higher, I suggested to stop glipizide and Januvia and start Ozempic.  This was initially denied by her insurance, but approved after recent an appeal.  She started the medication in 12/2018.  We have to continue with metformin ER since she had diarrhea from the regular metformin.  She tolerates Ozempic well, without GI symptoms.  She did have nausea in the beginning, now resolved she lost 21 pounds after starting Ozempic! -At this visit, sugars are almost all at goal, so we can try to decrease the dose of her insulin.  I advised her that if her sugars remain as well-controlled as now, to decrease the dose even further and may even be able to stop insulin.  Since she is still having some  loose stools with metformin, I advised her to try to reduce the dose to only 1000 mg daily. - I suggested to:  Patient Instructions  Please decrease: - Levemir 10 units at bedtime - Metformin ER 1000 mg daily  Continue: - Ozempic 0.5 mg weekly  Please come back for a follow-up appointment in 4 months.  - we checked her HbA1c: 5.9% (excellent) - advised to check sugars at different times of the day - 1x a day, rotating check times - advised for yearly eye exams >> she is UTD - return to clinic in 4 months      2. HL - Reviewed latest lipid panel from 08/2018: LDL lower than 100, triglycerides and HDL at goal Lab Results  Component Value Date   CHOL 160 08/18/2018   HDL 39.20 08/18/2018   LDLCALC 97 08/18/2018   LDLDIRECT 142.3 03/06/2008   TRIG 120.0 08/18/2018   CHOLHDL 4 08/18/2018  - Continues Crestor and fish oil without side effects.  3.  Obesity class I -Continue Ozempic which should also help with weight loss  -she lost 21  lbs since last visit after starting Ozempic! -  We will also try to reduce insulin dose, which should also help with weight loss   Carlus Pavlovristina Zailynn Brandel, MD PhD [facility]

## 2019-05-28 ENCOUNTER — Other Ambulatory Visit: Payer: Self-pay | Admitting: Family Medicine

## 2019-05-28 NOTE — Telephone Encounter (Signed)
RF request for sertraline LOV:02/16/19 Next ov: advised to f/u 6 mo CPE, Feb Last written: 01/24/19 (45,3)   Requesting:Diazepam Contract: 02/16/19 UDS: n/a Last Visit:02/16/19 Next Visit: advised to f/u 6 mo CPE, Feb Last Refill: 02/24/18 (60,5)  Please Advise

## 2019-06-21 ENCOUNTER — Other Ambulatory Visit: Payer: Self-pay | Admitting: Family Medicine

## 2019-06-21 DIAGNOSIS — I10 Essential (primary) hypertension: Secondary | ICD-10-CM

## 2019-06-21 DIAGNOSIS — E785 Hyperlipidemia, unspecified: Secondary | ICD-10-CM

## 2019-07-19 ENCOUNTER — Other Ambulatory Visit: Payer: Self-pay

## 2019-07-23 ENCOUNTER — Other Ambulatory Visit: Payer: Self-pay

## 2019-07-23 ENCOUNTER — Encounter: Payer: Self-pay | Admitting: Internal Medicine

## 2019-07-23 ENCOUNTER — Ambulatory Visit (INDEPENDENT_AMBULATORY_CARE_PROVIDER_SITE_OTHER): Payer: 59 | Admitting: Internal Medicine

## 2019-07-23 VITALS — BP 130/80 | HR 80 | Ht 68.0 in | Wt 165.0 lb

## 2019-07-23 DIAGNOSIS — E663 Overweight: Secondary | ICD-10-CM

## 2019-07-23 DIAGNOSIS — Z794 Long term (current) use of insulin: Secondary | ICD-10-CM | POA: Diagnosis not present

## 2019-07-23 DIAGNOSIS — E785 Hyperlipidemia, unspecified: Secondary | ICD-10-CM | POA: Diagnosis not present

## 2019-07-23 DIAGNOSIS — E119 Type 2 diabetes mellitus without complications: Secondary | ICD-10-CM

## 2019-07-23 LAB — POCT GLYCOSYLATED HEMOGLOBIN (HGB A1C): Hemoglobin A1C: 5.8 % — AB (ref 4.0–5.6)

## 2019-07-23 MED ORDER — OZEMPIC (0.25 OR 0.5 MG/DOSE) 2 MG/1.5ML ~~LOC~~ SOPN: 0.5000 mg | PEN_INJECTOR | SUBCUTANEOUS | 3 refills | Status: DC

## 2019-07-23 NOTE — Addendum Note (Signed)
Addended by: Darliss Ridgel I on: 07/23/2019 09:22 AM   Modules accepted: Orders

## 2019-07-23 NOTE — Progress Notes (Signed)
Patient ID: Raven [name], female   DOB: 1955/11/04, 64 y.o.   MRN: [id number]  This visit occurred during the SARS-CoV-2 public health emergency.  Safety protocols were in place, including screening questions prior to the visit, additional usage of staff PPE, and extensive cleaning of exam room while observing appropriate contact time as indicated for disinfecting solutions.   HPI: Raven [name] is a 64 y.o.-year-old female, returning for f/u for DM2, dx ~1995, insulin-dependent since 11/2013 - now insulin-independent, controlled, without long-term complications. Last visit 4 months ago.  Reviewed HbA1c levels: Lab Results  Component Value Date   HGBA1C 5.9 (A) 03/21/2019   HGBA1C 6.8 (A) 11/15/2018   HGBA1C 6.7 (A) 06/16/2018   She has RA >> was on Simponi (changed from Remicade) and MTX >> started Orencia and stays on MTX. On Plaquenil.  On prednisone 5 mg daily.  Pt is on a regimen of: - Metformin ER 1500 >> 500 mg 2x a day - Ozempic 0.5 mg weekly - started 12/2018 -initially nausea, now resolved - Levemir 14 >> 10 >> 4-6 units at bedtime. She tried Glimepiride in the past >> fluctuating blood sugar. We stopped glipizide and Januvia only started Ozempic 12/2018.  In the past, she was checking her sugars with her CGM, several times a day, but she had to stop that she could not afford it anymore.  She checks sugars once a day: - am:90-110 >> 114-147 >> 107-110 >> 87-110 >> 90-114 - 2h after b'fast: 160-180 >> 120-130 >> n/c >> 120-140 - before lunch: n/c >> 110-120 >> n/c - 2h after lunch: n/c >> 140-154 >> n/c  - before dinner: 130-150 >> 150 >> n/c - 2h after dinner: 189-236 >> 170s, 240x2 >> 150-160, 183 >> 120-140 - bedtime: 110-120 >> n/c   - nighttime: n/c >> 100-150 >> n/c Lowest sugar was 52 (x2 -Glipizide) >> 87 >> 90;  she has hypoglycemia awareness in the 70s. Highest sugar was 324 >> 240 >> 183 >> 199.  Meter: ReliOn.  Pt's meals are: - Breakfast:  scrambled eggs + bacon + tomatoes - Lunch: meat + 2 veggies + no bread unless burger - Dinner: meat + 2 veggies - Snacks: 2: nuts; cheese;   -No CKD, last BUN/creatinine:  Lab Results  Component Value Date   BUN 15 01/24/2019   CREATININE 0.9 01/24/2019  On lisinopril 10. -+ HL; last set of lipids: Lab Results  Component Value Date   CHOL 160 08/18/2018   HDL 39.20 08/18/2018   LDLCALC 97 08/18/2018   LDLDIRECT 142.3 03/06/2008   TRIG 120.0 08/18/2018   CHOLHDL 4 08/18/2018  She was on statins but taken off b/c transaminitis in 10/2013.  Atorvastatin gave her muscle cramps.  She now tolerates well Crestor 10 and fish oil. - last eye exam 04/2019: No DR, had cataract>> had cataract sx's 05/2018. - She denies numbness and tingling in her feet.  ROS: Constitutional: no weight gain/+ weight loss, no fatigue, no subjective hyperthermia, no subjective hypothermia Eyes: no blurry vision, no xerophthalmia ENT: no sore throat, no nodules palpated in neck, no dysphagia, no odynophagia, no hoarseness Cardiovascular: no CP/no SOB/no palpitations/no leg swelling Respiratory: no cough/no SOB/no wheezing Gastrointestinal: no N/no V/no D/no C/no acid reflux Musculoskeletal: no muscle aches/no joint aches Skin: no rashes, no hair loss Neurological: + tremors/no numbness/no tingling/no dizziness  I reviewed pt's medications, allergies, PMH, social hx, family hx, and changes were documented in the history of present illness. Otherwise, unchanged  from my initial visit note.  Past Medical History:  Diagnosis Date  . Acute medial meniscus tear of right knee   . Anemia of chronic disease    Mild, Hb stable consistently  . Anxiety and depression   . Chest pain 10/2017   +Cardiac CT.  Myocardial perfusion imaging NORMAL, EF>65%  . Diabetes mellitus 1995   Managed by Dr. Elvera Lennox (Endo)  . GERD (gastroesophageal reflux disease) per pt watches diet and take tums as needed  . H/O hiatal hernia     slightly larger ("moderate" size) on CT angio done to r/o PE 10/2017.  Marland Kitchen Hyperlipidemia   . Hypertension   . Interstitial cystitis   . Lichen sclerosus of female genitalia   . Migraine   . Osteoarthritis   . Palpitations 10/2017   3 wk event monitor: symptoms correlate with sinus rhythm with PACs, at times runs of PACs up to 5 beats.  . Pseudogout of knee, left    colchicine helpful  . Psoriasis   . Seasonal allergies   . Seropositive rheumatoid arthritis (HCC)    Hands, knees, jaw, elbow: responding to Simponi as of 09/29/15 rheum f/u.  Waning effectiveness on 03/2016 f/u so pt switched to Orencia injections.  Doing well on chronic low-dose prednisone therapy+ methotrexate +orencia as of 01/2019.  Marland Kitchen Symptomatic cholelithiasis 10/2017   Lap chole summer 2019    Past Surgical History:  Procedure Laterality Date  . ABDOMINAL HYSTERECTOMY  1991   for fibroids; ovaries still in  . BREAST BIOPSY  2001; 02/12/16   fibrocystic breast disease in 2001 and again in 02/2016 (+scar from prev bx site w/calcifications).  . CARDIOVASCULAR STRESS TEST  11/01/2017   Myocard perf imaging: NORMAL (EF >60-65%)  . COLONOSCOPY  09/12/2013   Recall 5 yrs ([facility], Dr. Evette Cristal) due to FH of colon polyps.  Manfred Shirts   for chronic UTI  . DEXA  10/2016   Bone density normal (T-score 0.0)  . ESOPHAGOGASTRODUODENOSCOPY  04/19/2011   Nl except antral gastritis: bx = mild chron gastritis (H pyloria neg)   . EVENT MONITOR  10/2017   3 wk-->Symptoms correlate with sinus rhythm with PACs, at times runs of PACs up to 5 beats.  . intraarticular steroid injection  2013   3 R knee & L X 1; Dr Netta Corrigan  . KNEE ARTHROCENTESIS  2013   R knee x 3 & L X 1  . KNEE ARTHROSCOPY  10/21/2011   Procedure: ARTHROSCOPY KNEE;  Surgeon: Jacki Cones, MD;  Location: [facility];  Service: Orthopedics;  Laterality: Right;  WITH MEDIAL and lateral shaving of femoral chondyl  with microfracture technique of  lateral and medial femoral chondyl suprapatellar synovectomy  . LAPAROSCOPIC CHOLECYSTECTOMY  01/2018  . TOTAL KNEE ARTHROPLASTY  04/12/2012   Procedure: TOTAL KNEE ARTHROPLASTY;  Surgeon: Jacki Cones, MD;  Location: [facility];  Service: Orthopedics;  Laterality: Right;  Right Total Knee Arthroplasty  . TUBAL LIGATION  1981    Social History   Socioeconomic History  . Marital status: Married    Spouse name: Not on file  . Number of children: Not on file  . Years of education: Not on file  . Highest education level: Not on file  Occupational History  . Not on file  Tobacco Use  . Smoking status: Never Smoker  . Smokeless tobacco: Never Used  Substance and Sexual Activity  . Alcohol use: No  . Drug use: No  .  Sexual activity: Not on file  Other Topics Concern  . Not on file  Social History Narrative   Married, 3 children (2 in [place], 1 in [place]).  4 grandchildren.   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)   No tob/alc/drugs.   2 cups of coffee/day x 3 months   Regular exercise- no-due to arthritis.   Religion affecting care, "it allows stress management:"   Social Determinants of Health   Financial Resource Strain:   . Difficulty of Paying Living Expenses: Not on file  Food Insecurity:   . Worried About Programme researcher, broadcasting/film/video in the Last Year: Not on file  . Ran Out of Food in the Last Year: Not on file  Transportation Needs:   . Lack of Transportation (Medical): Not on file  . Lack of Transportation (Non-Medical): Not on file  Physical Activity:   . Days of Exercise per Week: Not on file  . Minutes of Exercise per Session: Not on file  Stress:   . Feeling of Stress : Not on file  Social Connections:   . Frequency of Communication with Friends and Family: Not on file  . Frequency of Social Gatherings with Friends and Family: Not on file  . Attends Religious Services: Not on file  . Active Member of Clubs or Organizations: Not on file  . Attends Tax inspector Meetings: Not on file  . Marital Status: Not on file  Intimate Partner Violence:   . Fear of Current or Ex-Partner: Not on file  . Emotionally Abused: Not on file  . Physically Abused: Not on file  . Sexually Abused: Not on file    Current Outpatient Medications on File Prior to Visit  Medication Sig Dispense Refill  . Abatacept (ORENCIA) 50 MG/0.4ML SOSY Inject 1 Dose into the skin once a week.    . cetirizine (ZYRTEC) 10 MG tablet Take 10 mg by mouth every evening.     . Cholecalciferol (VITAMIN D3) 1.25 MG (50000 UT) CAPS Take by mouth daily.    . colchicine 0.6 MG tablet Take 1 tablet by mouth 2 (two) times daily.    . cyclobenzaprine (FLEXERIL) 5 MG tablet Take 5 mg by mouth 3 (three) times daily as needed for muscle spasms. Take 1 1/2 tablets 3 times daily    . diazepam (VALIUM) 5 MG tablet Take 1 tablet (5 mg total) by mouth every 12 (twelve) hours as needed for Migraine headaches 60 tablet 5  . FOLIC ACID PO Take 1 mg by mouth daily.     . Insulin Detemir (LEVEMIR FLEXTOUCH) 100 UNIT/ML Pen INJECT 10 UNITS INTO THE SKIN DAILY AT 10PM 15 mL 11  . lisinopril (ZESTRIL) 20 MG tablet TAKE 1/2 TABLET BY MOUTH EVERY DAY 45 tablet 0  . metFORMIN (GLUCOPHAGE-XR) 500 MG 24 hr tablet Take 1 tablet (500 mg total) by mouth 2 (two) times daily with a meal. 180 tablet 3  . Methotrexate Sodium (METHOTREXATE PO) Inject 1 mg as directed every Wednesday.     . Omega-3 Fatty Acids (FISH OIL) 1000 MG CAPS Take by mouth 2 (two) times daily.    . predniSONE (DELTASONE) 5 MG tablet Take 5 mg by mouth daily.    . primidone (MYSOLINE) 50 MG tablet Take 50 mg by mouth. 3 by mouth at bedtime    . rosuvastatin (CRESTOR) 10 MG tablet TAKE ONE TABLET BY MOUTH EVERY DAY 90 tablet 0  . Semaglutide,0.25 or 0.5MG /DOS, (OZEMPIC, 0.25 OR 0.5 MG/DOSE,) 2 MG/1.5ML  SOPN Inject 0.5 mg into the skin once a week. 2 pen 5  . sertraline (ZOLOFT) 100 MG tablet TAKE 1 AND 1/2 TABLETS BY MOUTH EVERY DAY 45 tablet 3    No current facility-administered medications on file prior to visit.    Allergies  Allergen Reactions  . Penicillins Rash  . Pravastatin     Myalgias  . Simvastatin     Myalgias  . Hydrocodone-Acetaminophen Nausea And Vomiting    Family History  Problem Relation Age of Onset  . Diabetes Mother   . Stroke Mother 80  . Osteoarthritis Mother   . Stroke Brother 42  . Heart disease Father        CABG  . Lung cancer Father   . Prostate cancer Father   . Pancreatic cancer Father   . Pancreatic cancer Paternal Grandfather   . Bipolar disorder Daughter   . Anxiety disorder Son   . Arthritis Maternal Grandmother        rheumatoid  . Heart attack Brother 44       smoker    PE: BP 130/80   Pulse 80   Ht 5\' 8"  (1.727 m)   Wt 165 lb (74.8 kg)   SpO2 99%   BMI 25.09 kg/m  Body mass index is 25.09 kg/m.  Wt Readings from Last 3 Encounters:  07/23/19 165 lb (74.8 kg)  03/21/19 187 lb (84.8 kg)  02/16/19 193 lb 9.6 oz (87.8 kg)   Constitutional: overweight, in NAD Eyes: PERRLA, EOMI, no exophthalmos ENT: moist mucous membranes, no thyromegaly, no cervical lymphadenopathy Cardiovascular: RRR, No MRG Respiratory: CTA B Gastrointestinal: abdomen soft, NT, ND, BS+ Musculoskeletal: no deformities, strength intact in all 4 Skin: moist, warm, no rashes Neurological: + tremor with outstretched hands, DTR normal in all 4  ASSESSMENT: 1. DM2, insulin-independent, controlled, without long term complications, but with Hgly -She was on the freestyle libre CGM in the past but cannot afford it anymore.  2. HL  3.  Overweight  PLAN:  1. Patient with longstanding, previously uncontrolled diabetes, with better control on the low-dose basal insulin, oral medications and weekly GLP-1 receptor agonist.  She now tolerates Ozempic well, without GI symptoms, initially she had nausea with it.  She lost a tremendous amount of weight after starting Ozempic and her sugars are much better.   HbA1c at last visit was 5.9%.  At that time, sugars are almost all at goal so we started to decrease her insulin and we also decreased the dose of Metformin, since she had some loose stools. -At this visit, sugars are at goal at all times of the day even after she reduce the insulin dose from 10 units to only 4-6 units daily.  No lows.  She had 1 hyperglycemic spike at 199 but she may have had an episode of gastroenteritis, which resolved after she vomited.  At this visit, I suggested to stop insulin completely.  We will continue with Metformin and Ozempic only.  I will have her come back in 4 months and at that time, if sugars are still at target, we may reduce the Ozempic dose. - I suggested to:  Patient Instructions  Please continue: - Metformin ER 500 mg 2x daily - Ozempic 0.5 mg weekly  Please stop Levemir.  Please come back for a follow-up appointment in 4 months.  - we checked her HbA1c: 5.8% (slight better) - advised to check sugars at different times of the day - 1x a day, rotating check  times - advised for yearly eye exams >> she is UTD - return to clinic in 4 months     2. HL -Reviewed latest lipid panel from 08/2018: LDL lower than 100, the rest of the fraction is also at goal Lab Results  Component Value Date   CHOL 160 08/18/2018   HDL 39.20 08/18/2018   LDLCALC 97 08/18/2018   LDLDIRECT 142.3 03/06/2008   TRIG 120.0 08/18/2018   CHOLHDL 4 08/18/2018  -Continues Crestor and fish oil without side effects  3.  Overweight - she is now out of the obesity category -Continue Ozempic which should also help with weight loss -Immediately after starting Ozempic, she lost 21 pounds and since last visit she lost another 22 pounds!!! -We are also decreasing her insulin doses, which is also help with weight loss   Carlus Pavlov, MD PhD [facility] Endocrinology

## 2019-07-23 NOTE — Patient Instructions (Addendum)
Please continue: - Metformin ER 500 mg 2x daily - Ozempic 0.5 mg weekly  Please stop Levemir.  Please come back for a follow-up appointment in 4 months.

## 2019-08-11 IMAGING — NM NM MYOCAR MULTI W/SPECT W/WALL MOTION & EF
2 series · 12 of 12 positions shown · non-contrast
Comparison: none

[Series 1: rest · 6.51mm/px · 6 of 64 frames shown]
[frame 6/64]
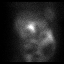
[frame 16/64]
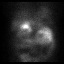
[frame 27/64]
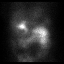
[frame 38/64]
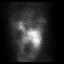
[frame 48/64]
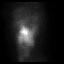
[frame 59/64]
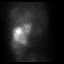

[Series 3: stress gated - perfusion · 6.51mm/px · 6 of 64 frames shown]
[frame 6/64]
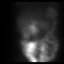
[frame 16/64]
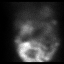
[frame 27/64]
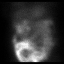
[frame 38/64]
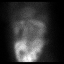
[frame 48/64]
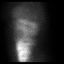
[frame 59/64]
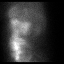

[12 of 12 positions shown; findings below may reference images not displayed]

Canned report from images found in remote index.

Refer to host system for actual result text.

## 2019-08-16 LAB — CBC AND DIFFERENTIAL
HCT: 37 (ref 36–46)
Hemoglobin: 12.7 (ref 12.0–16.0)
Neutrophils Absolute: 4
Platelets: 259 (ref 150–399)
WBC: 8

## 2019-08-16 LAB — COMPREHENSIVE METABOLIC PANEL
Albumin: 4.6 (ref 3.5–5.0)
Calcium: 9.2 (ref 8.7–10.7)
GFR calc Af Amer: 83
GFR calc non Af Amer: 72
Globulin: 2.1

## 2019-08-16 LAB — HEPATIC FUNCTION PANEL
ALT: 25 (ref 7–35)
AST: 26 (ref 13–35)
Alkaline Phosphatase: 67 (ref 25–125)
Bilirubin, Total: 0.3

## 2019-08-16 LAB — BASIC METABOLIC PANEL
BUN: 14 (ref 4–21)
CO2: 22 (ref 13–22)
Chloride: 105 (ref 99–108)
Creatinine: 0.9 (ref 0.5–1.1)
Glucose: 110
Potassium: 4.6 (ref 3.4–5.3)
Sodium: 142 (ref 137–147)

## 2019-08-16 LAB — CBC: RBC: 4.28 (ref 3.87–5.11)

## 2019-08-22 ENCOUNTER — Encounter: Payer: Self-pay | Admitting: Family Medicine

## 2019-09-05 ENCOUNTER — Encounter: Payer: Self-pay | Admitting: Family Medicine

## 2019-09-23 ENCOUNTER — Encounter: Payer: Self-pay | Admitting: Family Medicine

## 2019-10-08 ENCOUNTER — Telehealth: Payer: Self-pay

## 2019-10-08 NOTE — Telephone Encounter (Signed)
Prior authorization for Ozempic has been approved by patient's insurance.  Coverage is effective 10/05/2019 to 10/02/2020   Approval letter has been sent to scanning.

## 2019-10-29 ENCOUNTER — Other Ambulatory Visit: Payer: Self-pay | Admitting: Family Medicine

## 2019-11-09 ENCOUNTER — Encounter: Payer: Self-pay | Admitting: Family Medicine

## 2019-11-09 ENCOUNTER — Ambulatory Visit (INDEPENDENT_AMBULATORY_CARE_PROVIDER_SITE_OTHER): Payer: 59 | Admitting: Family Medicine

## 2019-11-09 ENCOUNTER — Other Ambulatory Visit: Payer: Self-pay

## 2019-11-09 VITALS — BP 103/71 | HR 77 | Temp 98.0°F | Resp 16 | Ht 68.0 in | Wt 166.6 lb

## 2019-11-09 DIAGNOSIS — R1310 Dysphagia, unspecified: Secondary | ICD-10-CM | POA: Diagnosis not present

## 2019-11-09 DIAGNOSIS — Z79899 Other long term (current) drug therapy: Secondary | ICD-10-CM | POA: Diagnosis not present

## 2019-11-09 DIAGNOSIS — K219 Gastro-esophageal reflux disease without esophagitis: Secondary | ICD-10-CM

## 2019-11-09 DIAGNOSIS — Z114 Encounter for screening for human immunodeficiency virus [HIV]: Secondary | ICD-10-CM | POA: Diagnosis not present

## 2019-11-09 DIAGNOSIS — E7800 Pure hypercholesterolemia, unspecified: Secondary | ICD-10-CM

## 2019-11-09 DIAGNOSIS — Z1211 Encounter for screening for malignant neoplasm of colon: Secondary | ICD-10-CM

## 2019-11-09 DIAGNOSIS — Z1159 Encounter for screening for other viral diseases: Secondary | ICD-10-CM

## 2019-11-09 DIAGNOSIS — M059 Rheumatoid arthritis with rheumatoid factor, unspecified: Secondary | ICD-10-CM

## 2019-11-09 DIAGNOSIS — I10 Essential (primary) hypertension: Secondary | ICD-10-CM | POA: Diagnosis not present

## 2019-11-09 DIAGNOSIS — F988 Other specified behavioral and emotional disorders with onset usually occurring in childhood and adolescence: Secondary | ICD-10-CM

## 2019-11-09 DIAGNOSIS — E785 Hyperlipidemia, unspecified: Secondary | ICD-10-CM | POA: Diagnosis not present

## 2019-11-09 DIAGNOSIS — Z1231 Encounter for screening mammogram for malignant neoplasm of breast: Secondary | ICD-10-CM

## 2019-11-09 DIAGNOSIS — Z0000 Encounter for general adult medical examination without abnormal findings: Secondary | ICD-10-CM | POA: Diagnosis not present

## 2019-11-09 LAB — CBC WITH DIFFERENTIAL/PLATELET
Basophils Absolute: 0 10*3/uL (ref 0.0–0.1)
Basophils Relative: 0.6 % (ref 0.0–3.0)
Eosinophils Absolute: 0.2 10*3/uL (ref 0.0–0.7)
Eosinophils Relative: 2.5 % (ref 0.0–5.0)
HCT: 36.9 % (ref 36.0–46.0)
Hemoglobin: 12.4 g/dL (ref 12.0–15.0)
Lymphocytes Relative: 48.4 % — ABNORMAL HIGH (ref 12.0–46.0)
Lymphs Abs: 3.1 10*3/uL (ref 0.7–4.0)
MCHC: 33.7 g/dL (ref 30.0–36.0)
MCV: 85.2 fl (ref 78.0–100.0)
Monocytes Absolute: 0.4 10*3/uL (ref 0.1–1.0)
Monocytes Relative: 5.9 % (ref 3.0–12.0)
Neutro Abs: 2.8 10*3/uL (ref 1.4–7.7)
Neutrophils Relative %: 42.6 % — ABNORMAL LOW (ref 43.0–77.0)
Platelets: 220 10*3/uL (ref 150.0–400.0)
RBC: 4.33 Mil/uL (ref 3.87–5.11)
RDW: 14.5 % (ref 11.5–15.5)
WBC: 6.5 10*3/uL (ref 4.0–10.5)

## 2019-11-09 LAB — LIPID PANEL
Cholesterol: 193 mg/dL (ref 0–200)
HDL: 47.8 mg/dL (ref >39.00)
LDL Cholesterol: 119 mg/dL — ABNORMAL HIGH (ref 0–99)
NonHDL: 144.86
Total CHOL/HDL Ratio: 4
Triglycerides: 129 mg/dL (ref 0.0–149.0)
VLDL: 25.8 mg/dL (ref 0.0–40.0)

## 2019-11-09 LAB — COMPREHENSIVE METABOLIC PANEL
ALT: 59 U/L — ABNORMAL HIGH (ref 0–35)
AST: 43 U/L — ABNORMAL HIGH (ref 0–37)
Albumin: 4.4 g/dL (ref 3.5–5.2)
Alkaline Phosphatase: 58 U/L (ref 39–117)
BUN: 13 mg/dL (ref 6–23)
CO2: 24 mEq/L (ref 19–32)
Calcium: 8.9 mg/dL (ref 8.4–10.5)
Chloride: 104 mEq/L (ref 96–112)
Creatinine, Ser: 0.78 mg/dL (ref 0.40–1.20)
GFR: 74.27 mL/min (ref >60.00)
Glucose, Bld: 117 mg/dL — ABNORMAL HIGH (ref 70–99)
Potassium: 4 mEq/L (ref 3.5–5.1)
Sodium: 137 mEq/L (ref 135–145)
Total Bilirubin: 0.6 mg/dL (ref 0.2–1.2)
Total Protein: 6.5 g/dL (ref 6.0–8.3)

## 2019-11-09 LAB — TSH: TSH: 1.37 u[IU]/mL (ref 0.35–4.50)

## 2019-11-09 MED ORDER — ROSUVASTATIN CALCIUM 10 MG PO TABS: 10.0000 mg | ORAL_TABLET | Freq: Every day | ORAL | 3 refills | Status: DC

## 2019-11-09 MED ORDER — LISINOPRIL 20 MG PO TABS: 10.0000 mg | ORAL_TABLET | Freq: Every day | ORAL | 3 refills | Status: DC

## 2019-11-09 MED ORDER — AMPHETAMINE-DEXTROAMPHET ER 15 MG PO CP24: 15.0000 mg | ORAL_CAPSULE | ORAL | 0 refills | Status: DC

## 2019-11-09 MED ORDER — OMEPRAZOLE 40 MG PO CPDR: 40.0000 mg | DELAYED_RELEASE_CAPSULE | Freq: Every day | ORAL | 3 refills | Status: DC

## 2019-11-09 MED ORDER — SERTRALINE HCL 100 MG PO TABS: 100.0000 mg | ORAL_TABLET | Freq: Every day | ORAL | 3 refills | Status: DC

## 2019-11-09 NOTE — Progress Notes (Signed)
Office Note 11/09/2019  CC:  Chief Complaint  Patient presents with  . Annual Exam    pt is fasting   HPI:  Raven [name] is a 64 y.o. White female who is here for annual health maintenance exam.  About 1 mo she's been having trouble swallowing.  More noticeable with large pills but has to chew food a lot in order not to have discomfort swallowing.  No recurg or coughing spells with eating. Hoarseness of voice on and off.  Some dry cough unrelated to eating. Has signif episodes of GERD.  Was on prilosec long term and transitioned to pepcid but nothing in months now. She takes tums prn (about 2 times a month).  Fighting lack of motivation more lately.  Past hx of GAD->She had decreased her sertraline from 1.5 tabs qd to 1 qd but motivation issue was present PRIOR to that. Denies signif sadness/depressed mood. Says her anxiety is not problematic much lately either. 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 Thinks that require planning/difficulty/complexity --she avoids.  Poor focus, increased distractibility. No hypomania/mania, no euphoria.   Past Medical History:  Diagnosis Date  . Acute medial meniscus tear of right knee   . Anemia of chronic disease    Mild, Hb stable consistently  . Anxiety and depression   . Chest pain 10/2017   +Cardiac CT.  Myocardial perfusion imaging NORMAL, EF>65%  . Diabetes mellitus 1995   Managed by Dr. Elvera Lennox (Endo)  . GERD (gastroesophageal reflux disease) per pt watches diet and take tums as needed  . H/O hiatal hernia    slightly larger ("moderate" size) on CT angio done to r/o PE 10/2017.  Marland Kitchen Hyperlipidemia   . Hypertension   . Interstitial cystitis   . Lichen sclerosus of female genitalia   . Migraine   . Osteoarthritis, multiple sites   . Palpitations 10/2017   3 wk event monitor: symptoms correlate with sinus rhythm with PACs, at times runs of PACs up to 5 beats.  .  Pseudogout of knee, left    colchicine helpful  . Psoriasis   . Seasonal allergies   . Seropositive rheumatoid arthritis (HCC)    Hands, knees, jaw, elbow: responding to Simponi as of 09/29/15 rheum f/u.  Waning effectiveness on 03/2016 f/u so pt switched to Orencia injections.  Doing well on chronic low-dose prednisone therapy+ methotrexate +orencia as of 01/2019, 08/2019.  Marland Kitchen Symptomatic cholelithiasis 10/2017   Lap chole summer 2019    Past Surgical History:  Procedure Laterality Date  . ABDOMINAL HYSTERECTOMY  1991   for fibroids; ovaries still in  . BREAST BIOPSY  2001; 02/12/16   fibrocystic breast disease in 2001 and again in 02/2016 (+scar from prev bx site w/calcifications).  . CARDIOVASCULAR STRESS TEST  11/01/2017   Myocard perf imaging: NORMAL (EF >60-65%)  . COLONOSCOPY  09/12/2013   Recall 5 yrs ([facility], Dr. Evette Cristal) due to FH of colon polyps.  Manfred Shirts   for chronic UTI  . DEXA  10/2016   Bone density normal (T-score 0.0)  . ESOPHAGOGASTRODUODENOSCOPY  04/19/2011   Nl except antral gastritis: bx = mild chron gastritis (H pyloria neg)   . EVENT MONITOR  10/2017   3 wk-->Symptoms correlate with sinus rhythm with PACs, at times runs of PACs up to 5 beats.  . intraarticular steroid injection  2013   3 R knee & L X 1; Dr Netta Corrigan  .  KNEE ARTHROCENTESIS  2013   R knee x 3 & L X 1  . KNEE ARTHROSCOPY  10/21/2011   Procedure: ARTHROSCOPY KNEE;  Surgeon: Jacki Cones, MD;  Location: [facility];  Service: Orthopedics;  Laterality: Right;  WITH MEDIAL and lateral shaving of femoral chondyl  with microfracture technique of lateral and medial femoral chondyl suprapatellar synovectomy  . LAPAROSCOPIC CHOLECYSTECTOMY  01/2018  . TOTAL KNEE ARTHROPLASTY  04/12/2012   Procedure: TOTAL KNEE ARTHROPLASTY;  Surgeon: Jacki Cones, MD;  Location: [facility];  Service: Orthopedics;  Laterality: Right;  Right Total Knee Arthroplasty  . TUBAL LIGATION  1981    Family  History  Problem Relation Age of Onset  . Diabetes Mother   . Stroke Mother 56  . Osteoarthritis Mother   . Stroke Brother 21  . Heart disease Father        CABG  . Lung cancer Father   . Prostate cancer Father   . Pancreatic cancer Father   . Pancreatic cancer Paternal Grandfather   . Bipolar disorder Daughter   . Anxiety disorder Son   . Arthritis Maternal Grandmother        rheumatoid  . Heart attack Brother 11       smoker    Social History   Socioeconomic History  . Marital status: Married    Spouse name: Not on file  . Number of children: Not on file  . Years of education: Not on file  . Highest education level: Not on file  Occupational History  . Not on file  Tobacco Use  . Smoking status: Never Smoker  . Smokeless tobacco: Never Used  Substance and Sexual Activity  . Alcohol use: No  . Drug use: No  . Sexual activity: Not on file  Other Topics Concern  . Not on file  Social History Narrative   Married, 3 children (2 in [place], 1 in [place]).  4 grandchildren.   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)   No tob/alc/drugs.   2 cups of coffee/day x 3 months   Regular exercise- no-due to arthritis.   Religion affecting care, "it allows stress management:"   Social Determinants of Health   Financial Resource Strain:   . Difficulty of Paying Living Expenses:   Food Insecurity:   . Worried About Programme researcher, broadcasting/film/video in the Last Year:   . Barista in the Last Year:   Transportation Needs:   . Freight forwarder (Medical):   Marland Kitchen Lack of Transportation (Non-Medical):   Physical Activity:   . Days of Exercise per Week:   . Minutes of Exercise per Session:   Stress:   . Feeling of Stress :   Social Connections:   . Frequency of Communication with Friends and Family:   . Frequency of Social Gatherings with Friends and Family:   . Attends Religious Services:   . Active Member of Clubs or Organizations:   . Attends Banker  Meetings:   Marland Kitchen Marital Status:   Intimate Partner Violence:   . Fear of Current or Ex-Partner:   . Emotionally Abused:   Marland Kitchen Physically Abused:   . Sexually Abused:     Outpatient Medications Prior to Visit  Medication Sig Dispense Refill  . Abatacept (ORENCIA) 50 MG/0.4ML SOSY Inject 1 Dose into the skin once a week.    . cetirizine (ZYRTEC) 10 MG tablet Take 10 mg by mouth every evening.     Marland Kitchen  Cholecalciferol (VITAMIN D3) 1.25 MG (50000 UT) CAPS Take by mouth daily.    . colchicine 0.6 MG tablet Take 1 tablet by mouth 2 (two) times daily.    . cyclobenzaprine (FLEXERIL) 5 MG tablet Take 5 mg by mouth 3 (three) times daily as needed for muscle spasms. Take 1 1/2 tablets 3 times daily    . diazepam (VALIUM) 5 MG tablet Take 1 tablet (5 mg total) by mouth every 12 (twelve) hours as needed for Migraine headaches 60 tablet 5  . FOLIC ACID PO Take 1 mg by mouth daily.     . metFORMIN (GLUCOPHAGE-XR) 500 MG 24 hr tablet Take 1 tablet (500 mg total) by mouth 2 (two) times daily with a meal. 180 tablet 3  . Methotrexate Sodium (METHOTREXATE PO) Inject 1 mg as directed every Wednesday.     . Omega-3 Fatty Acids (FISH OIL) 1000 MG CAPS Take by mouth 2 (two) times daily.    . predniSONE (DELTASONE) 5 MG tablet Take 5 mg by mouth daily.    . primidone (MYSOLINE) 50 MG tablet Take 50 mg by mouth. 3 by mouth at bedtime    . Semaglutide,0.25 or 0.5MG /DOS, (OZEMPIC, 0.25 OR 0.5 MG/DOSE,) 2 MG/1.5ML SOPN Inject 0.5 mg into the skin once a week. 3 pen 3  . lisinopril (ZESTRIL) 20 MG tablet TAKE 1/2 TABLET BY MOUTH EVERY DAY 45 tablet 0  . rosuvastatin (CRESTOR) 10 MG tablet TAKE ONE TABLET BY MOUTH EVERY DAY 90 tablet 0  . sertraline (ZOLOFT) 100 MG tablet TAKE 1 AND 1/2 TABLETS BY MOUTH EVERY DAY 45 tablet 3   No facility-administered medications prior to visit.    Allergies  Allergen Reactions  . Penicillins Rash  . Pravastatin     Myalgias  . Simvastatin     Myalgias  . Hydrocodone-Acetaminophen  Nausea And Vomiting    ROS Review of Systems  Constitutional: Negative for appetite change, chills, fatigue and fever.  HENT: Negative for congestion, dental problem, ear pain and sore throat.   Eyes: Negative for discharge, redness and visual disturbance.  Respiratory: Negative for cough, chest tightness, shortness of breath and wheezing.   Cardiovascular: Negative for chest pain, palpitations and leg swelling.  Gastrointestinal: Negative for abdominal pain, blood in stool, diarrhea, nausea and vomiting.  Genitourinary: Negative for difficulty urinating, dysuria, flank pain, frequency, hematuria and urgency.  Musculoskeletal: Negative for arthralgias, back pain, joint swelling, myalgias and neck stiffness.  Skin: Negative for pallor and rash.  Neurological: Negative for dizziness, speech difficulty, weakness and headaches.  Hematological: Negative for adenopathy. Does not bruise/bleed easily.  Psychiatric/Behavioral: Negative for confusion and sleep disturbance. The patient is not nervous/anxious.     PE; Vitals with BMI 11/09/2019 07/23/2019 03/21/2019  Height 5\' 8"  5\' 8"  5\' 8"   Weight 166 lbs 10 oz 165 lbs 187 lbs  BMI 25.34 93.81 82.99  Systolic 371 696 789  Diastolic 71 80 90  Pulse 77 80 82    Gen: Alert, well appearing.  Patient is oriented to person, place, time, and situation. AFFECT: pleasant, lucid thought and speech. ENT: Ears: EACs clear, normal epithelium.  TMs with good light reflex and landmarks bilaterally.  Eyes: no injection, icteris, swelling, or exudate.  EOMI, PERRLA. Nose: no drainage or turbinate edema/swelling.  No injection or focal lesion.  Mouth: lips without lesion/swelling.  Oral mucosa pink and moist.  Dentition intact and without obvious caries or gingival swelling.  Oropharynx without erythema, exudate, or swelling.  Neck: supple/nontender.  No LAD, mass, or TM.  Carotid pulses 2+ bilaterally, without bruits. CV: RRR, no m/r/g.   LUNGS: CTA bilat,  nonlabored resps, good aeration in all lung fields. ABD: soft, NT, ND, BS normal.  No hepatospenomegaly or mass.  No bruits. EXT: no clubbing, cyanosis, or edema.  Musculoskeletal: no joint swelling, erythema, warmth, or tenderness.  ROM of all joints intact. Skin - no sores or suspicious lesions or rashes or color changes   Pertinent labs:  Lab Results  Component Value Date   TSH 3.72 08/18/2018   Lab Results  Component Value Date   WBC 8.0 08/16/2019   HGB 12.7 08/16/2019   HCT 37 08/16/2019   MCV 82.2 08/18/2018   PLT 259 08/16/2019   Lab Results  Component Value Date   CREATININE 0.9 08/16/2019   BUN 14 08/16/2019   NA 142 08/16/2019   K 4.6 08/16/2019   CL 105 08/16/2019   CO2 22 08/16/2019   Lab Results  Component Value Date   ALT 25 08/16/2019   AST 26 08/16/2019   ALKPHOS 67 08/16/2019   BILITOT 0.3 08/18/2018   Lab Results  Component Value Date   CHOL 160 08/18/2018   Lab Results  Component Value Date   HDL 39.20 08/18/2018   Lab Results  Component Value Date   LDLCALC 97 08/18/2018   Lab Results  Component Value Date   TRIG 120.0 08/18/2018   Lab Results  Component Value Date   CHOLHDL 4 08/18/2018   Lab Results  Component Value Date   HGBA1C 5.8 (A) 07/23/2019   ASSESSMENT AND PLAN:   1) Health maintenance exam: Reviewed age and gender appropriate health maintenance issues (prudent diet, regular exercise, health risks of tobacco and excessive alcohol, use of seatbelts, fire alarms in home, use of sunscreen).  Also reviewed age and gender appropriate health screening as well as vaccine recommendations. Vaccines: UTD including covid and shingrix. Labs: fasting HP+ Hep C and HIV screening. Cervical ca screening: n/a-->pt with hx of TAH for benign dx. Breast ca screening: mamm overdue-->pt waiting until covid hysteria is signif diminished. Colon ca screening: repeat colonoscopy was due 09/2018->pt waiting until covid hysteria is signif  diminished.  Will forward labs to her rheum MD, Dr. Nickola Major.  2) GERD with dysphagia. Her dysphagia sounds relatively mild at this point. Restart 40mg  prilosec qd and refer to GI for EGD consideration and they'll likely make a plan for her repeat screening colonoscopy at that point.  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 She prefers no counseling ref since virtual visits for this don't appeal to her. She is on board for trial of stimulant. Start adderall xr 15mg  qd trial. Therapeutic expectations and side effect profile of medication discussed today.  Patient's questions answered. From what she reports today, her lack of motivation is not a symptom of worsening of her anxiety or mood disorders.   Continue sertraline at 100 mg qd dosing.  An After Visit Summary was printed and given to the patient.  FOLLOW UP:  Return in about 4 weeks (around 12/07/2019) for f/u add.  Signed:  , MD           11/09/2019

## 2019-11-12 LAB — HIV ANTIBODY (ROUTINE TESTING W REFLEX): HIV 1&2 Ab, 4th Generation: NONREACTIVE

## 2019-11-12 LAB — HEPATITIS C ANTIBODY
Hepatitis C Ab: NONREACTIVE
SIGNAL TO CUT-OFF: 0.01 (ref <1.00)

## 2019-11-19 ENCOUNTER — Telehealth: Payer: Self-pay

## 2019-11-19 NOTE — Telephone Encounter (Signed)
Patient called in wanting to know what can Dr give her for shingles out break . She has had her injections and still has shingles.    Please call and advise

## 2019-11-19 NOTE — Telephone Encounter (Signed)
Pt advised treatment best determined after seeing in office. Appt scheduled.

## 2019-11-22 ENCOUNTER — Ambulatory Visit (INDEPENDENT_AMBULATORY_CARE_PROVIDER_SITE_OTHER): Payer: 59 | Admitting: Family Medicine

## 2019-11-22 ENCOUNTER — Other Ambulatory Visit: Payer: Self-pay

## 2019-11-22 ENCOUNTER — Encounter: Payer: Self-pay | Admitting: Family Medicine

## 2019-11-22 VITALS — BP 120/74 | HR 83 | Temp 97.8°F | Resp 16 | Ht 68.0 in | Wt 168.8 lb

## 2019-11-22 DIAGNOSIS — B029 Zoster without complications: Secondary | ICD-10-CM

## 2019-11-22 DIAGNOSIS — Z79899 Other long term (current) drug therapy: Secondary | ICD-10-CM

## 2019-11-22 DIAGNOSIS — D84821 Immunodeficiency due to drugs: Secondary | ICD-10-CM

## 2019-11-22 MED ORDER — TRAMADOL HCL 50 MG PO TABS: ORAL_TABLET | ORAL | 1 refills | Status: DC

## 2019-11-22 NOTE — Progress Notes (Signed)
OFFICE VISIT  11/22/2019   CC:  Chief Complaint  Patient presents with  . Possible shingles outbreak   HPI:    Patient is a 64 y.o. Caucasian female who presents for pt concern about possible shingles outbreak. Onset R glut & lateral hip pain, hypesthesia about 3 wks ago, then broke out in rash.  Her rheumatologist rx'd her valtrex and she has taken this for 3d and is beginning to feel less pain.  Notes hx of 2 prior episodes in remote past.  The rash has begun to dry/resolve. Has been taking tylenol and ibup regularly.  Feeling fatigue and HA.  No fever/chills.  Pt is on orencia, methotrexate, and chronic prednisone for her RA-->immunocompromised.   Past Medical History:  Diagnosis Date  . Acute medial meniscus tear of right knee   . Anemia of chronic disease    Mild, Hb stable consistently  . Anxiety and depression   . Chest pain 10/2017   +Cardiac CT.  Myocardial perfusion imaging NORMAL, EF>65%  . Diabetes mellitus 1995   Managed by Dr. Elvera Lennox (Endo)  . GERD (gastroesophageal reflux disease) per pt watches diet and take tums as needed  . H/O hiatal hernia    slightly larger ("moderate" size) on CT angio done to r/o PE 10/2017.  Marland Kitchen Hyperlipidemia   . Hypertension   . Interstitial cystitis   . Lichen sclerosus of female genitalia   . Migraine   . Osteoarthritis, multiple sites   . Palpitations 10/2017   3 wk event monitor: symptoms correlate with sinus rhythm with PACs, at times runs of PACs up to 5 beats.  . Pseudogout of knee, left    colchicine helpful  . Psoriasis   . Seasonal allergies   . Seropositive rheumatoid arthritis (HCC)    Hands, knees, jaw, elbow: responding to Simponi as of 09/29/15 rheum f/u.  Waning effectiveness on 03/2016 f/u so pt switched to Orencia injections.  Doing well on chronic low-dose prednisone therapy+ methotrexate +orencia as of 01/2019, 08/2019.  Marland Kitchen Symptomatic cholelithiasis 10/2017   Lap chole summer 2019    Past Surgical History:   Procedure Laterality Date  . ABDOMINAL HYSTERECTOMY  1991   for fibroids; ovaries still in  . BREAST BIOPSY  2001; 02/12/16   fibrocystic breast disease in 2001 and again in 02/2016 (+scar from prev bx site w/calcifications).  . CARDIOVASCULAR STRESS TEST  11/01/2017   Myocard perf imaging: NORMAL (EF >60-65%)  . COLONOSCOPY  09/12/2013   Recall 5 yrs ([facility], Dr. Evette Cristal) due to FH of colon polyps.  Manfred Shirts   for chronic UTI  . DEXA  10/2016   Bone density normal (T-score 0.0)  . ESOPHAGOGASTRODUODENOSCOPY  04/19/2011   Nl except antral gastritis: bx = mild chron gastritis (H pyloria neg)   . EVENT MONITOR  10/2017   3 wk-->Symptoms correlate with sinus rhythm with PACs, at times runs of PACs up to 5 beats.  . intraarticular steroid injection  2013   3 R knee & L X 1; Dr Netta Corrigan  . KNEE ARTHROCENTESIS  2013   R knee x 3 & L X 1  . KNEE ARTHROSCOPY  10/21/2011   Procedure: ARTHROSCOPY KNEE;  Surgeon: Jacki Cones, MD;  Location: [facility];  Service: Orthopedics;  Laterality: Right;  WITH MEDIAL and lateral shaving of femoral chondyl  with microfracture technique of lateral and medial femoral chondyl suprapatellar synovectomy  . LAPAROSCOPIC CHOLECYSTECTOMY  01/2018  . TOTAL KNEE  ARTHROPLASTY  04/12/2012   Procedure: TOTAL KNEE ARTHROPLASTY;  Surgeon: Tobi Bastos, MD;  Location: [facility];  Service: Orthopedics;  Laterality: Right;  Right Total Knee Arthroplasty  . TUBAL LIGATION  1981    Outpatient Medications Prior to Visit  Medication Sig Dispense Refill  . Abatacept (ORENCIA) 50 MG/0.4ML SOSY Inject 1 Dose into the skin once a week.    Marland Kitchen amphetamine-dextroamphetamine (ADDERALL XR) 15 MG 24 hr capsule Take 1 capsule by mouth every morning. 30 capsule 0  . cetirizine (ZYRTEC) 10 MG tablet Take 10 mg by mouth every evening.     . Cholecalciferol (VITAMIN D3) 1.25 MG (50000 UT) CAPS Take by mouth daily.    . colchicine 0.6 MG tablet Take 1 tablet by  mouth 2 (two) times daily.    . cyclobenzaprine (FLEXERIL) 5 MG tablet Take 5 mg by mouth 3 (three) times daily as needed for muscle spasms. Take 1 1/2 tablets 3 times daily    . diazepam (VALIUM) 5 MG tablet Take 1 tablet (5 mg total) by mouth every 12 (twelve) hours as needed for Migraine headaches 60 tablet 5  . FOLIC ACID PO Take 1 mg by mouth daily.     Marland Kitchen lisinopril (ZESTRIL) 20 MG tablet Take 0.5 tablets (10 mg total) by mouth daily. 45 tablet 3  . metFORMIN (GLUCOPHAGE-XR) 500 MG 24 hr tablet Take 1 tablet (500 mg total) by mouth 2 (two) times daily with a meal. 180 tablet 3  . Methotrexate Sodium (METHOTREXATE PO) Inject 1 mg as directed every Wednesday.     . Omega-3 Fatty Acids (FISH OIL) 1000 MG CAPS Take by mouth 2 (two) times daily.    Marland Kitchen omeprazole (PRILOSEC) 40 MG capsule Take 1 capsule (40 mg total) by mouth daily. 30 capsule 3  . predniSONE (DELTASONE) 5 MG tablet Take 5 mg by mouth daily.    . primidone (MYSOLINE) 50 MG tablet Take 50 mg by mouth. 3 by mouth at bedtime    . rosuvastatin (CRESTOR) 10 MG tablet Take 1 tablet (10 mg total) by mouth daily. 90 tablet 3  . Semaglutide,0.25 or 0.5MG /DOS, (OZEMPIC, 0.25 OR 0.5 MG/DOSE,) 2 MG/1.5ML SOPN Inject 0.5 mg into the skin once a week. 3 pen 3  . sertraline (ZOLOFT) 100 MG tablet Take 1 tablet (100 mg total) by mouth daily. 90 tablet 3   No facility-administered medications prior to visit.    Allergies  Allergen Reactions  . Penicillins Rash  . Pravastatin     Myalgias  . Simvastatin     Myalgias  . Hydrocodone-Acetaminophen Nausea And Vomiting    ROS As per HPI  PE: Blood pressure 120/74, pulse 83, temperature 97.8 F (36.6 C), temperature source Temporal, resp. rate 16, height 5\' 8"  (1.727 m), weight 168 lb 12.8 oz (76.6 kg), SpO2 100 %. Gen: Alert, well appearing.  Patient is oriented to person, place, time, and situation. R glut region with 10-12 dried papules on erythematous background.  LABS:    Chemistry       Component Value Date/Time   NA 137 11/09/2019 0919   NA 142 08/16/2019 0000   K 4.0 11/09/2019 0919   CL 104 11/09/2019 0919   CO2 24 11/09/2019 0919   BUN 13 11/09/2019 0919   BUN 14 08/16/2019 0000   CREATININE 0.78 11/09/2019 0919   CREATININE 0.82 10/14/2017 1524   GLU 110 08/16/2019 0000      Component Value Date/Time   CALCIUM 8.9 11/09/2019 0919  ALKPHOS 58 11/09/2019 0919   AST 43 (H) 11/09/2019 0919   ALT 59 (H) 11/09/2019 0919   BILITOT 0.6 11/09/2019 0919      IMPRESSION AND PLAN:  1) Herpes Zoster R glut, recurrent. Resolving on valtrex the last few days. Finish valtrex. Try tramadol 50-100 mg (#30, RF x 1) with tylenol or ibup supplement tid prn.  Also try otc capsaicin cream. Expect a more protracted recovery given her immunocompromised state.  Also discussed possibility of postherpetic neuralgia. Also, she has had shingrix vaccine so it appears her immune response to the vaccine was sub-optimal.  An After Visit Summary was printed and given to the patient.  FOLLOW UP: Return if symptoms worsen or fail to improve.  Signed:  Santiago Bumpers, MD           11/22/2019

## 2019-11-22 NOTE — Patient Instructions (Signed)
Try over the counter Capsaicin cream (generic is fine)-->apply as instructed on packaging.

## 2019-11-26 ENCOUNTER — Ambulatory Visit: Payer: 59 | Admitting: Internal Medicine

## 2019-11-26 ENCOUNTER — Encounter: Payer: Self-pay | Admitting: Internal Medicine

## 2019-11-26 ENCOUNTER — Other Ambulatory Visit: Payer: Self-pay

## 2019-11-26 VITALS — BP 122/80 | HR 86 | Ht 68.0 in | Wt 166.0 lb

## 2019-11-26 DIAGNOSIS — E663 Overweight: Secondary | ICD-10-CM

## 2019-11-26 DIAGNOSIS — Z794 Long term (current) use of insulin: Secondary | ICD-10-CM

## 2019-11-26 DIAGNOSIS — E119 Type 2 diabetes mellitus without complications: Secondary | ICD-10-CM | POA: Diagnosis not present

## 2019-11-26 DIAGNOSIS — E785 Hyperlipidemia, unspecified: Secondary | ICD-10-CM

## 2019-11-26 LAB — POCT GLYCOSYLATED HEMOGLOBIN (HGB A1C): Hemoglobin A1C: 6.6 % — AB (ref 4.0–5.6)

## 2019-11-26 NOTE — Progress Notes (Signed)
Patient ID: Raven [name], female   DOB: 29-Sep-1955, 64 y.o.   MRN: [id number]  This visit occurred during the SARS-CoV-2 public health emergency.  Safety protocols were in place, including screening questions prior to the visit, additional usage of staff PPE, and extensive cleaning of exam room while observing appropriate contact time as indicated for disinfecting solutions.   HPI: Raven [name] is a 64 y.o.-year-old female, returning for f/u for DM2, dx ~1995, insulin-dependent since 11/2013 - now insulin-independent, controlled, without long-term complications. Last visit 4 months ago.  She developed herpes zoster infection since last visit.  She was off Ozempic for 6 weeks starting 3 months ago as her insurance did not cover Ozempic >> sugars were higher >> restarted 6 weeks ago after a PA for Ozempic was approved.   Reviewed HbA1c levels: Lab Results  Component Value Date   HGBA1C 5.8 (A) 07/23/2019   HGBA1C 5.9 (A) 03/21/2019   HGBA1C 6.8 (A) 11/15/2018   She has RA >> was on Simponi (changed from Remicade) and MTX >> started Orencia and stays on MTX. On Plaquenil.  On prednisone 5 mg daily.  Pt is on a regimen of: - Metformin ER 1500 >> 500 mg 2x a day - Ozempic 0.5 mg weekly - started 12/2018 -she had initially nausea, now resolved Previously on Levemir 14 >> 10 >> 4-6 units at bedtime >> stopped 07/2019 She tried Glimepiride in the past >> fluctuating blood sugar. We stopped glipizide and Januvia only started Ozempic 12/2018.  In the past, she was checking her sugars with her CGM, several times a day, but she had to stop that she could not afford it anymore.  She checks sugars 2x a day: - am: 107-110 >> 87-110 >> 90-114 >> 125 (off Ozempic) >> 101-114 - 2h after b'fast: 160-180 >> 120-130 >> n/c >> 120-140 >> n/c - before lunch: n/c >> 110-120 >> n/c - 2h after lunch: n/c >> 140-154 >> n/c  - before dinner: 130-150 >> 150 >> n/c - 2h after dinner: 150-160, 183 >>  120-140 >> 160-180 (off Ozempic) >> 150 - bedtime: 110-120 >> n/c   - nighttime: n/c >> 100-150 >> n/c Lowest sugar was 52 (x2 -Glipizide) >> 87 >> 90 >> 91;  she has hypoglycemia awareness in the 70s. Highest sugar was 324 >> 240 >> 183 >> 199 >> 180.  Meter: ReliOn.  Pt's meals are: - Breakfast: scrambled eggs + bacon + tomatoes - Lunch: meat + 2 veggies + no bread unless burger - Dinner: meat + 2 veggies - Snacks: 2: nuts; cheese;   -No CKD, last BUN/creatinine:  Lab Results  Component Value Date   BUN 13 11/09/2019   CREATININE 0.78 11/09/2019  On lisinopril 10. -+ HL; last set of lipids: Lab Results  Component Value Date   CHOL 193 11/09/2019   HDL 47.80 11/09/2019   LDLCALC 119 (H) 11/09/2019   LDLDIRECT 142.3 03/06/2008   TRIG 129.0 11/09/2019   CHOLHDL 4 11/09/2019  She was on statins but taken off b/c transaminitis in 10/2013.  Atorvastatin gave her muscle cramps.  She now tolerates well Crestor 10 and fish oil. - last eye exam 04/2019: No DR, had cataract>> had cataract sx's 05/2018. - no numbness and tingling in her feet.  Started on Omeprazole for choking >> resolved.  ROS: Constitutional: no weight gain/no weight loss, no fatigue, no subjective hyperthermia, no subjective hypothermia Eyes: no blurry vision, no xerophthalmia ENT: no sore throat, no nodules palpated  in neck, no dysphagia, no odynophagia, no hoarseness Cardiovascular: no CP/no SOB/no palpitations/no leg swelling Respiratory: no cough/no SOB/no wheezing Gastrointestinal: no N/no V/no D/no C/no acid reflux Musculoskeletal: no muscle aches/no joint aches Skin: no rashes, no hair loss Neurological: + tremors/no numbness/no tingling/no dizziness  I reviewed pt's medications, allergies, PMH, social hx, family hx, and changes were documented in the history of present illness. Otherwise, unchanged from my initial visit note.  Past Medical History:  Diagnosis Date  . Acute medial meniscus tear of  right knee   . Anemia of chronic disease    Mild, Hb stable consistently  . Anxiety and depression   . Chest pain 10/2017   +Cardiac CT.  Myocardial perfusion imaging NORMAL, EF>65%  . Diabetes mellitus 1995   Managed by Dr. Elvera Lennox (Endo)  . GERD (gastroesophageal reflux disease) per pt watches diet and take tums as needed  . H/O hiatal hernia    slightly larger ("moderate" size) on CT angio done to r/o PE 10/2017.  Marland Kitchen Hyperlipidemia   . Hypertension   . Interstitial cystitis   . Lichen sclerosus of female genitalia   . Migraine   . Osteoarthritis, multiple sites   . Palpitations 10/2017   3 wk event monitor: symptoms correlate with sinus rhythm with PACs, at times runs of PACs up to 5 beats.  . Pseudogout of knee, left    colchicine helpful  . Psoriasis   . Seasonal allergies   . Seropositive rheumatoid arthritis (HCC)    Hands, knees, jaw, elbow: responding to Simponi as of 09/29/15 rheum f/u.  Waning effectiveness on 03/2016 f/u so pt switched to Orencia injections.  Doing well on chronic low-dose prednisone therapy+ methotrexate +orencia as of 01/2019, 08/2019.  Marland Kitchen Symptomatic cholelithiasis 10/2017   Lap chole summer 2019    Past Surgical History:  Procedure Laterality Date  . ABDOMINAL HYSTERECTOMY  1991   for fibroids; ovaries still in  . BREAST BIOPSY  2001; 02/12/16   fibrocystic breast disease in 2001 and again in 02/2016 (+scar from prev bx site w/calcifications).  . CARDIOVASCULAR STRESS TEST  11/01/2017   Myocard perf imaging: NORMAL (EF >60-65%)  . COLONOSCOPY  09/12/2013   Recall 5 yrs ([facility], Dr. Evette Cristal) due to FH of colon polyps.  Manfred Shirts   for chronic UTI  . DEXA  10/2016   Bone density normal (T-score 0.0)  . ESOPHAGOGASTRODUODENOSCOPY  04/19/2011   Nl except antral gastritis: bx = mild chron gastritis (H pyloria neg)   . EVENT MONITOR  10/2017   3 wk-->Symptoms correlate with sinus rhythm with PACs, at times runs of PACs up to 5 beats.  .  intraarticular steroid injection  2013   3 R knee & L X 1; Dr Netta Corrigan  . KNEE ARTHROCENTESIS  2013   R knee x 3 & L X 1  . KNEE ARTHROSCOPY  10/21/2011   Procedure: ARTHROSCOPY KNEE;  Surgeon: Jacki Cones, MD;  Location: [facility];  Service: Orthopedics;  Laterality: Right;  WITH MEDIAL and lateral shaving of femoral chondyl  with microfracture technique of lateral and medial femoral chondyl suprapatellar synovectomy  . LAPAROSCOPIC CHOLECYSTECTOMY  01/2018  . TOTAL KNEE ARTHROPLASTY  04/12/2012   Procedure: TOTAL KNEE ARTHROPLASTY;  Surgeon: Jacki Cones, MD;  Location: [facility];  Service: Orthopedics;  Laterality: Right;  Right Total Knee Arthroplasty  . TUBAL LIGATION  1981    Social History   Socioeconomic History  . Marital  status: Married    Spouse name: Not on file  . Number of children: Not on file  . Years of education: Not on file  . Highest education level: Not on file  Occupational History  . Not on file  Tobacco Use  . Smoking status: Never Smoker  . Smokeless tobacco: Never Used  Substance and Sexual Activity  . Alcohol use: No  . Drug use: No  . Sexual activity: Not on file  Other Topics Concern  . Not on file  Social History Narrative   Married, 3 children (2 in [place], 1 in [place]).  4 grandchildren.   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   2 cups of coffee/day x 3 months   Regular exercise- no-due to arthritis.   Religion affecting care, "it allows stress management:"   Social Determinants of Health   Financial Resource Strain:   . Difficulty of Paying Living Expenses:   Food Insecurity:   . Worried About Charity fundraiser in the Last Year:   . Arboriculturist in the Last Year:   Transportation Needs:   . Film/video editor (Medical):   Marland Kitchen Lack of Transportation (Non-Medical):   Physical Activity:   . Days of Exercise per Week:   . Minutes of Exercise per Session:   Stress:   . Feeling  of Stress :   Social Connections:   . Frequency of Communication with Friends and Family:   . Frequency of Social Gatherings with Friends and Family:   . Attends Religious Services:   . Active Member of Clubs or Organizations:   . Attends Archivist Meetings:   Marland Kitchen Marital Status:   Intimate Partner Violence:   . Fear of Current or Ex-Partner:   . Emotionally Abused:   Marland Kitchen Physically Abused:   . Sexually Abused:     Current Outpatient Medications on File Prior to Visit  Medication Sig Dispense Refill  . Abatacept (ORENCIA) 50 MG/0.4ML SOSY Inject 1 Dose into the skin once a week.    Marland Kitchen amphetamine-dextroamphetamine (ADDERALL XR) 15 MG 24 hr capsule Take 1 capsule by mouth every morning. 30 capsule 0  . cetirizine (ZYRTEC) 10 MG tablet Take 10 mg by mouth every evening.     . Cholecalciferol (VITAMIN D3) 1.25 MG (50000 UT) CAPS Take by mouth daily.    . colchicine 0.6 MG tablet Take 1 tablet by mouth 2 (two) times daily.    . cyclobenzaprine (FLEXERIL) 5 MG tablet Take 5 mg by mouth 3 (three) times daily as needed for muscle spasms. Take 1 1/2 tablets 3 times daily    . diazepam (VALIUM) 5 MG tablet Take 1 tablet (5 mg total) by mouth every 12 (twelve) hours as needed for Migraine headaches 60 tablet 5  . FOLIC ACID PO Take 1 mg by mouth daily.     Marland Kitchen lisinopril (ZESTRIL) 20 MG tablet Take 0.5 tablets (10 mg total) by mouth daily. 45 tablet 3  . metFORMIN (GLUCOPHAGE-XR) 500 MG 24 hr tablet Take 1 tablet (500 mg total) by mouth 2 (two) times daily with a meal. 180 tablet 3  . Methotrexate Sodium (METHOTREXATE PO) Inject 1 mg as directed every Wednesday.     . Omega-3 Fatty Acids (FISH OIL) 1000 MG CAPS Take by mouth 2 (two) times daily.    Marland Kitchen omeprazole (PRILOSEC) 40 MG capsule Take 1 capsule (40 mg total) by mouth daily. 30 capsule 3  . predniSONE (DELTASONE) 5  MG tablet Take 5 mg by mouth daily.    . primidone (MYSOLINE) 50 MG tablet Take 50 mg by mouth. 3 by mouth at bedtime    .  rosuvastatin (CRESTOR) 10 MG tablet Take 1 tablet (10 mg total) by mouth daily. 90 tablet 3  . Semaglutide,0.25 or 0.5MG /DOS, (OZEMPIC, 0.25 OR 0.5 MG/DOSE,) 2 MG/1.5ML SOPN Inject 0.5 mg into the skin once a week. 3 pen 3  . sertraline (ZOLOFT) 100 MG tablet Take 1 tablet (100 mg total) by mouth daily. 90 tablet 3  . traMADol (ULTRAM) 50 MG tablet 1-2 tabs po tid prn pain 30 tablet 1   No current facility-administered medications on file prior to visit.    Allergies  Allergen Reactions  . Penicillins Rash  . Pravastatin     Myalgias  . Simvastatin     Myalgias  . Hydrocodone-Acetaminophen Nausea And Vomiting    Family History  Problem Relation Age of Onset  . Diabetes Mother   . Stroke Mother 75  . Osteoarthritis Mother   . Stroke Brother 58  . Heart disease Father        CABG  . Lung cancer Father   . Prostate cancer Father   . Pancreatic cancer Father   . Pancreatic cancer Paternal Grandfather   . Bipolar disorder Daughter   . Anxiety disorder Son   . Arthritis Maternal Grandmother        rheumatoid  . Heart attack Brother 41       smoker   PE: BP 122/80   Pulse 86   Ht 5\' 8"  (1.727 m)   Wt 166 lb (75.3 kg)   SpO2 99%   BMI 25.24 kg/m  Body mass index is 25.24 kg/m.  Wt Readings from Last 3 Encounters:  11/26/19 166 lb (75.3 kg)  11/22/19 168 lb 12.8 oz (76.6 kg)  11/09/19 166 lb 9.6 oz (75.6 kg)   Constitutional: overweight, in NAD Eyes: PERRLA, EOMI, no exophthalmos ENT: moist mucous membranes, no thyromegaly, no cervical lymphadenopathy Cardiovascular: RRR, No MRG Respiratory: CTA B Gastrointestinal: abdomen soft, NT, ND, BS+ Musculoskeletal: no deformities, strength intact in all 4 Skin: moist, warm, no rashes Neurological: + tremor with outstretched hands, DTR normal in all 4  ASSESSMENT: 1. DM2, insulin-independent, controlled, without long term complications, but with Hgly -She was on the freestyle libre CGM in the past but cannot afford it  anymore.  2. HL  3.  Overweight  PLAN:  1. Patient with longstanding, previously uncontrolled diabetes, improved control lately after addition of a GLP-1 receptor agonist, after which we can stop insulin.  She is currently on Metformin and intermediate dose Ozempic.  At last visit, we could stop her Levemir.  She is using a lower dose of Metformin ER due to loose stools. -At this visit, sugars are at goal, but she tells me that they were higher especially in the evening, when she was off the Ozempic for 1.5 months.  However, she was able to restart it after PA was approved by her insurance and she has now been on it again for 1.5 months.  Since sugars are at goal now, no changes are needed in her regimen. - I suggested to:  Patient Instructions  Please continue: - Metformin ER 500 mg 2x daily - Ozempic 0.5 mg weekly  Please come back for a follow-up appointment in 6 months.  - we checked her HbA1c: 6.6% (higher) - advised to check sugars at different times of the day -  1x a day, rotating check times - advised for yearly eye exams >> she is UTD - return to clinic in 6 months    2. HL -Reviewed latest lipid panel from 10/2019: LDL still above goal, the rest of the fractions at goal: Lab Results  Component Value Date   CHOL 193 11/09/2019   HDL 47.80 11/09/2019   LDLCALC 119 (H) 11/09/2019   LDLDIRECT 142.3 03/06/2008   TRIG 129.0 11/09/2019   CHOLHDL 4 11/09/2019  -Continues Crestor without side effects.  Also on fish oil.  3.  Overweight -Highest weight: 260 pounds -Currently out of the obesity category -Continue Ozempic, which should also help with weight loss -After starting Ozempic, she lost 21 pounds and before last visit she lost another 22 pounds.  She gained 1 pound back since last visit   Carlus Pavlov, MD PhD [facility] Endocrinology

## 2019-11-26 NOTE — Patient Instructions (Signed)
Please continue: - Metformin ER 500 mg 2x daily - Ozempic 0.5 mg weekly  Please come back for a follow-up appointment in 6 months.

## 2019-11-26 NOTE — Addendum Note (Signed)
Addended by: Darliss Ridgel I on: 11/26/2019 10:54 AM   Modules accepted: Orders

## 2019-12-07 ENCOUNTER — Other Ambulatory Visit: Payer: Self-pay | Admitting: Family Medicine

## 2019-12-07 NOTE — Telephone Encounter (Signed)
Requesting: Adderall 15mg  Contract:02/16/19 UDS:n/a Last Visit:11/22/19, acute and 11/09/19 Next Visit: advised to f/u end of May for ADD Last Refill:11/09/19(30,0)  Please Advise. Medication pending

## 2019-12-07 NOTE — Telephone Encounter (Signed)
Refill sent   Patient notified

## 2019-12-14 ENCOUNTER — Other Ambulatory Visit: Payer: Self-pay

## 2019-12-14 ENCOUNTER — Ambulatory Visit (INDEPENDENT_AMBULATORY_CARE_PROVIDER_SITE_OTHER): Payer: 59 | Admitting: Family Medicine

## 2019-12-14 ENCOUNTER — Encounter: Payer: Self-pay | Admitting: Family Medicine

## 2019-12-14 VITALS — BP 122/84 | HR 81 | Temp 98.3°F | Resp 16 | Ht 68.0 in | Wt 166.0 lb

## 2019-12-14 DIAGNOSIS — F988 Other specified behavioral and emotional disorders with onset usually occurring in childhood and adolescence: Secondary | ICD-10-CM

## 2019-12-14 MED ORDER — AMPHETAMINE-DEXTROAMPHET ER 15 MG PO CP24: ORAL_CAPSULE | ORAL | 0 refills | Status: DC

## 2019-12-14 NOTE — Progress Notes (Signed)
OFFICE VISIT  12/14/2019   CC:  Chief Complaint  Patient presents with  . Follow-up    ADD   HPI:    Patient is a 64 y.o. Caucasian female who presents for 1 mo f/u suspected adult ADD. A/P as of last visit: "1) Health maintenance exam: Reviewed age and gender appropriate health maintenance issues (prudent diet, regular exercise, health risks of tobacco and excessive alcohol, use of seatbelts, fire alarms in home, use of sunscreen).  Also reviewed age and gender appropriate health screening as well as vaccine recommendations. Vaccines: UTD including covid and shingrix. Labs: fasting HP+ Hep C and HIV screening. Cervical ca screening:n/a-->pt with hx of TAH for benign dx. Breast ca screening:mamm overdue-->pt waiting until covid hysteria is signif diminished. Colon ca screening:repeat colonoscopy was due 09/2018->pt waiting until covid hysteria is signif diminished.  Will forward labs to her rheum MD, Dr. Trudie Reed.  2) GERD with dysphagia. Her dysphagia sounds relatively mild at this point. Restart 40mg  prilosec qd and refer to GI for EGD consideration and they'll likely make a plan for her repeat screening colonoscopy at that point.  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 She prefers no counseling ref since virtual visits for this don't appeal to her. She is on board for trial of stimulant. Start adderall xr 15mg  qd trial. Therapeutic expectations and side effect profile of medication discussed today.  Patient's questions answered. From what she reports today, her lack of motivation is not a symptom of worsening of her anxiety or mood disorders.   Continue sertraline at 100 mg qd dosing.  INTERIM HX: A couple weeks ago she had shingles, was resolving on valtrex when I saw her in the office. This is completely resolved.  Pt states all is going well with the med at current dosing: much improved focus,  concentration, task completion.  Less frustration, better multitasking, less impulsivity and restlessness.  Mood is stable.  Anxiety---hardly has any. Takes med daily around 9 AM, feels beneficial effects for about 8-9 hours.  Feels fine as effect dissipates except some tiredness.   She feel like she has had increased need for sleep since getting on the med since getting on the med.  Sleeps about 8-9 PM to 8-9 am.    She feels like the degree of improvement on current dose is sufficient.    Past Medical History:  Diagnosis Date  . Acute medial meniscus tear of right knee   . Anemia of chronic disease    Mild, Hb stable consistently  . Anxiety and depression   . Chest pain 10/2017   +Cardiac CT.  Myocardial perfusion imaging NORMAL, EF>65%  . Diabetes mellitus 1995   Managed by Dr. Cruzita Lederer (Endo)  . GERD (gastroesophageal reflux disease) per pt watches diet and take tums as needed  . H/O hiatal hernia    slightly larger ("moderate" size) on CT angio done to r/o PE 10/2017.  Marland Kitchen Hyperlipidemia   . Hypertension   . Interstitial cystitis   . Lichen sclerosus of female genitalia   . Migraine   . Osteoarthritis, multiple sites   . Palpitations 10/2017   3 wk event monitor: symptoms correlate with sinus rhythm with PACs, at times runs of PACs up to 5 beats.  . Pseudogout of knee, left    colchicine helpful  . Psoriasis   . Seasonal allergies   . Seropositive rheumatoid arthritis (HCC)    Hands, knees, jaw, elbow:  responding to Simponi as of 09/29/15 rheum f/u.  Waning effectiveness on 03/2016 f/u so pt switched to Orencia injections.  Doing well on chronic low-dose prednisone therapy+ methotrexate +orencia as of 01/2019, 08/2019.  Marland Kitchen Symptomatic cholelithiasis 10/2017   Lap chole summer 2019    Past Surgical History:  Procedure Laterality Date  . ABDOMINAL HYSTERECTOMY  1991   for fibroids; ovaries still in  . BREAST BIOPSY  2001; 02/12/16   fibrocystic breast disease in 2001 and again in  02/2016 (+scar from prev bx site w/calcifications).  . CARDIOVASCULAR STRESS TEST  11/01/2017   Myocard perf imaging: NORMAL (EF >60-65%)  . COLONOSCOPY  09/12/2013   Recall 5 yrs ([facility], Dr. Evette Cristal) due to FH of colon polyps.  Manfred Shirts   for chronic UTI  . DEXA  10/2016   Bone density normal (T-score 0.0)  . ESOPHAGOGASTRODUODENOSCOPY  04/19/2011   Nl except antral gastritis: bx = mild chron gastritis (H pyloria neg)   . EVENT MONITOR  10/2017   3 wk-->Symptoms correlate with sinus rhythm with PACs, at times runs of PACs up to 5 beats.  . intraarticular steroid injection  2013   3 R knee & L X 1; Dr Netta Corrigan  . KNEE ARTHROCENTESIS  2013   R knee x 3 & L X 1  . KNEE ARTHROSCOPY  10/21/2011   Procedure: ARTHROSCOPY KNEE;  Surgeon: Jacki Cones, MD;  Location: [facility];  Service: Orthopedics;  Laterality: Right;  WITH MEDIAL and lateral shaving of femoral chondyl  with microfracture technique of lateral and medial femoral chondyl suprapatellar synovectomy  . LAPAROSCOPIC CHOLECYSTECTOMY  01/2018  . TOTAL KNEE ARTHROPLASTY  04/12/2012   Procedure: TOTAL KNEE ARTHROPLASTY;  Surgeon: Jacki Cones, MD;  Location: [facility];  Service: Orthopedics;  Laterality: Right;  Right Total Knee Arthroplasty  . TUBAL LIGATION  1981    Outpatient Medications Prior to Visit  Medication Sig Dispense Refill  . Abatacept (ORENCIA) 50 MG/0.4ML SOSY Inject 1 Dose into the skin once a week.    . cetirizine (ZYRTEC) 10 MG tablet Take 10 mg by mouth every evening.     . Cholecalciferol (VITAMIN D3) 1.25 MG (50000 UT) CAPS Take by mouth daily.    . colchicine 0.6 MG tablet Take 1 tablet by mouth 2 (two) times daily.    . diazepam (VALIUM) 5 MG tablet Take 1 tablet (5 mg total) by mouth every 12 (twelve) hours as needed for Migraine headaches 60 tablet 5  . FOLIC ACID PO Take 1 mg by mouth daily.     Marland Kitchen lisinopril (ZESTRIL) 20 MG tablet Take 0.5 tablets (10 mg total) by mouth daily.  45 tablet 3  . metFORMIN (GLUCOPHAGE-XR) 500 MG 24 hr tablet Take 1 tablet (500 mg total) by mouth 2 (two) times daily with a meal. 180 tablet 3  . Methotrexate Sodium (METHOTREXATE PO) Inject 1 mg as directed every Wednesday.     . Omega-3 Fatty Acids (FISH OIL) 1000 MG CAPS Take by mouth 2 (two) times daily.    Marland Kitchen omeprazole (PRILOSEC) 40 MG capsule Take 1 capsule (40 mg total) by mouth daily. 30 capsule 3  . predniSONE (DELTASONE) 5 MG tablet Take 5 mg by mouth daily.    . primidone (MYSOLINE) 50 MG tablet Take 50 mg by mouth. 3 by mouth at bedtime    . rosuvastatin (CRESTOR) 10 MG tablet Take 1 tablet (10 mg total) by mouth daily. 90 tablet  3  . Semaglutide,0.25 or 0.5MG /DOS, (OZEMPIC, 0.25 OR 0.5 MG/DOSE,) 2 MG/1.5ML SOPN Inject 0.5 mg into the skin once a week. 3 pen 3  . sertraline (ZOLOFT) 100 MG tablet Take 1 tablet (100 mg total) by mouth daily. 90 tablet 3  . amphetamine-dextroamphetamine (ADDERALL XR) 15 MG 24 hr capsule TAKE ONE CAPSULE BY MOUTH EVERY MORNING 30 capsule 0  . cyclobenzaprine (FLEXERIL) 5 MG tablet Take 5 mg by mouth 3 (three) times daily as needed for muscle spasms. Take 1 1/2 tablets 3 times daily    . traMADol (ULTRAM) 50 MG tablet 1-2 tabs po tid prn pain (Patient not taking: Reported on 12/14/2019) 30 tablet 1   No facility-administered medications prior to visit.    Allergies  Allergen Reactions  . Penicillins Rash  . Pravastatin     Myalgias  . Simvastatin     Myalgias  . Hydrocodone-Acetaminophen Nausea And Vomiting    ROS As per HPI  PE: Blood pressure 122/84, pulse 81, temperature 98.3 F (36.8 C), temperature source Temporal, resp. rate 16, height 5\' 8"  (1.727 m), weight 166 lb (75.3 kg), SpO2 96 %. Wt Readings from Last 2 Encounters:  12/14/19 166 lb (75.3 kg)  11/26/19 166 lb (75.3 kg)    Gen: alert, oriented x 4, affect pleasant.  Lucid thinking and conversation noted. HEENT: PERRLA, EOMI.   Neck: no LAD, mass, or thyromegaly. CV: RRR,  no m/r/g LUNGS: CTA bilat, nonlabored. NEURO: no tremor or tics noted on observation.  Coordination intact. CN 2-12 grossly intact bilaterally, strength 5/5 in all extremeties.  No ataxia.   LABS:  Lab Results  Component Value Date   TSH 1.37 11/09/2019   Lab Results  Component Value Date   WBC 6.5 11/09/2019   HGB 12.4 11/09/2019   HCT 36.9 11/09/2019   MCV 85.2 11/09/2019   PLT 220.0 11/09/2019   Lab Results  Component Value Date   CREATININE 0.78 11/09/2019   BUN 13 11/09/2019   NA 137 11/09/2019   K 4.0 11/09/2019   CL 104 11/09/2019   CO2 24 11/09/2019   Lab Results  Component Value Date   ALT 59 (H) 11/09/2019   AST 43 (H) 11/09/2019   ALKPHOS 58 11/09/2019   BILITOT 0.6 11/09/2019   Lab Results  Component Value Date   CHOL 193 11/09/2019   Lab Results  Component Value Date   HDL 47.80 11/09/2019   Lab Results  Component Value Date   LDLCALC 119 (H) 11/09/2019   Lab Results  Component Value Date   TRIG 129.0 11/09/2019   Lab Results  Component Value Date   CHOLHDL 4 11/09/2019   Lab Results  Component Value Date   HGBA1C 6.6 (A) 11/26/2019   IMPRESSION AND PLAN:  1) Adult ADD: The current medical regimen is effective;  continue present plan and medications. Her excess need for sleep in evenings should gradually dissipate the longer she is on adderall. Reassured. I did electronic rx's for adderall xr 15mg , 1 qAM, #30 today for each of the next 3 mo.  Appropriate fill on/after date was noted on each rx. [facility] will be updated to include adderall at the time of the next f/u and we'll get UDS at that time as well. Pt expressed understanding and agreement with plan.  An After Visit Summary was printed and given to the patient.  FOLLOW UP: Return in about 3 months (around 03/15/2020) for f/u add.  Signed:  , MD  12/14/2019      

## 2020-01-05 ENCOUNTER — Encounter: Payer: Self-pay | Admitting: Family Medicine

## 2020-02-12 ENCOUNTER — Other Ambulatory Visit: Payer: Self-pay | Admitting: Gastroenterology

## 2020-02-12 DIAGNOSIS — R1310 Dysphagia, unspecified: Secondary | ICD-10-CM

## 2020-02-15 ENCOUNTER — Ambulatory Visit
Admission: RE | Admit: 2020-02-15 | Discharge: 2020-02-15 | Disposition: A | Discharge status: Home / Self Care | Payer: 59 | Source: Ambulatory Visit | Attending: Gastroenterology | Admitting: Gastroenterology

## 2020-02-15 DIAGNOSIS — R1310 Dysphagia, unspecified: Secondary | ICD-10-CM

## 2020-02-21 ENCOUNTER — Other Ambulatory Visit: Payer: Self-pay | Admitting: Gastroenterology

## 2020-02-25 LAB — HM COLONOSCOPY

## 2020-02-29 ENCOUNTER — Encounter: Payer: Self-pay | Admitting: Family Medicine

## 2020-03-03 ENCOUNTER — Other Ambulatory Visit: Payer: Self-pay | Admitting: Family Medicine

## 2020-03-04 ENCOUNTER — Encounter: Payer: Self-pay | Admitting: Family Medicine

## 2020-03-19 ENCOUNTER — Other Ambulatory Visit: Payer: Self-pay

## 2020-03-21 ENCOUNTER — Ambulatory Visit (INDEPENDENT_AMBULATORY_CARE_PROVIDER_SITE_OTHER): Payer: 59 | Admitting: Family Medicine

## 2020-03-21 ENCOUNTER — Encounter: Payer: Self-pay | Admitting: Family Medicine

## 2020-03-21 ENCOUNTER — Other Ambulatory Visit: Payer: Self-pay

## 2020-03-21 VITALS — BP 129/84 | HR 66 | Temp 98.0°F | Resp 16 | Ht 68.0 in | Wt 171.2 lb

## 2020-03-21 DIAGNOSIS — R55 Syncope and collapse: Secondary | ICD-10-CM

## 2020-03-21 DIAGNOSIS — S060X1A Concussion with loss of consciousness of 30 minutes or less, initial encounter: Secondary | ICD-10-CM

## 2020-03-21 DIAGNOSIS — S060X9A Concussion with loss of consciousness of unspecified duration, initial encounter: Secondary | ICD-10-CM

## 2020-03-21 DIAGNOSIS — F329 Major depressive disorder, single episode, unspecified: Secondary | ICD-10-CM

## 2020-03-21 DIAGNOSIS — E7800 Pure hypercholesterolemia, unspecified: Secondary | ICD-10-CM | POA: Diagnosis not present

## 2020-03-21 DIAGNOSIS — Z79899 Other long term (current) drug therapy: Secondary | ICD-10-CM

## 2020-03-21 DIAGNOSIS — Z23 Encounter for immunization: Secondary | ICD-10-CM

## 2020-03-21 DIAGNOSIS — F419 Anxiety disorder, unspecified: Secondary | ICD-10-CM

## 2020-03-21 DIAGNOSIS — F32A Depression, unspecified: Secondary | ICD-10-CM

## 2020-03-21 DIAGNOSIS — I10 Essential (primary) hypertension: Secondary | ICD-10-CM | POA: Diagnosis not present

## 2020-03-21 DIAGNOSIS — F988 Other specified behavioral and emotional disorders with onset usually occurring in childhood and adolescence: Secondary | ICD-10-CM

## 2020-03-21 LAB — CBC WITH DIFFERENTIAL/PLATELET
Basophils Absolute: 0.1 10*3/uL (ref 0.0–0.1)
Basophils Relative: 1.2 % (ref 0.0–3.0)
Eosinophils Absolute: 0.2 10*3/uL (ref 0.0–0.7)
Eosinophils Relative: 2.1 % (ref 0.0–5.0)
HCT: 36.9 % (ref 36.0–46.0)
Hemoglobin: 12.1 g/dL (ref 12.0–15.0)
Lymphocytes Relative: 40.9 % (ref 12.0–46.0)
Lymphs Abs: 3.4 10*3/uL (ref 0.7–4.0)
MCHC: 32.9 g/dL (ref 30.0–36.0)
MCV: 88.7 fl (ref 78.0–100.0)
Monocytes Absolute: 0.9 10*3/uL (ref 0.1–1.0)
Monocytes Relative: 11.4 % (ref 3.0–12.0)
Neutro Abs: 3.6 10*3/uL (ref 1.4–7.7)
Neutrophils Relative %: 44.4 % (ref 43.0–77.0)
Platelets: 241 10*3/uL (ref 150.0–400.0)
RBC: 4.16 Mil/uL (ref 3.87–5.11)
RDW: 14.5 % (ref 11.5–15.5)
WBC: 8.2 10*3/uL (ref 4.0–10.5)

## 2020-03-21 LAB — LIPID PANEL
Cholesterol: 180 mg/dL (ref 0–200)
HDL: 53.7 mg/dL (ref >39.00)
LDL Cholesterol: 94 mg/dL (ref 0–99)
NonHDL: 125.93
Total CHOL/HDL Ratio: 3
Triglycerides: 162 mg/dL — ABNORMAL HIGH (ref 0.0–149.0)
VLDL: 32.4 mg/dL (ref 0.0–40.0)

## 2020-03-21 LAB — COMPREHENSIVE METABOLIC PANEL
ALT: 31 U/L (ref 0–35)
AST: 29 U/L (ref 0–37)
Albumin: 4.2 g/dL (ref 3.5–5.2)
Alkaline Phosphatase: 69 U/L (ref 39–117)
BUN: 10 mg/dL (ref 6–23)
CO2: 29 mEq/L (ref 19–32)
Calcium: 8.8 mg/dL (ref 8.4–10.5)
Chloride: 104 mEq/L (ref 96–112)
Creatinine, Ser: 0.77 mg/dL (ref 0.40–1.20)
GFR: 75.3 mL/min (ref >60.00)
Glucose, Bld: 133 mg/dL — ABNORMAL HIGH (ref 70–99)
Potassium: 4.3 mEq/L (ref 3.5–5.1)
Sodium: 140 mEq/L (ref 135–145)
Total Bilirubin: 0.4 mg/dL (ref 0.2–1.2)
Total Protein: 6.4 g/dL (ref 6.0–8.3)

## 2020-03-21 LAB — MAGNESIUM: Magnesium: 1.7 mg/dL (ref 1.5–2.5)

## 2020-03-21 MED ORDER — AMPHETAMINE-DEXTROAMPHET ER 15 MG PO CP24
ORAL_CAPSULE | ORAL | 0 refills | Status: DC
Start: 2020-03-21 — End: 2020-03-21

## 2020-03-21 MED ORDER — AMPHETAMINE-DEXTROAMPHET ER 15 MG PO CP24
ORAL_CAPSULE | ORAL | 0 refills | Status: DC
Start: 2020-03-21 — End: 2020-09-22

## 2020-03-21 NOTE — Progress Notes (Signed)
OFFICE VISIT  03/21/2020  CC:  Chief Complaint  Patient presents with  . Follow-up    RCI, pt is fasting   HPI:    Patient is a 64 y.o. Caucasian female who presents for 3 mo f/u adult ADD, GERD, HTN, chronic anx/dep. A/P as of last visit:" "1) Adult ADD: The current medical regimen is effective;  continue present plan and medications. Her excess need for sleep in evenings should gradually dissipate the longer she is on adderall. Reassured. I did electronic rx's for adderall xr 15mg , 1 qAM, #30 today for each of the next 3 mo.  Appropriate fill on/after date was noted on each rx. CSC will be updated to include adderall at the time of the next f/u and we'll get UDS at that time as well."  INTERIM HX: Wt Readings from Last 2 Encounters:  03/21/20 171 lb 3.2 oz (77.7 kg)  12/14/19 166 lb (75.3 kg)    Gen: alert, oriented x 4, affect pleasant.  Lucid thinking and conversation noted. HEENT: PERRLA, EOMI.   Neck: no LAD, mass, or thyromegaly. CV: RRR, no m/r/g LUNGS: CTA bilat, nonlabored. NEURO: no tremor or tics noted on observation.  Coordination intact. CN 2-12 grossly intact bilaterally, strength 5/5 in all extremeties.  No ataxia.   ADD: Pt states all is going well with the med at current dosing: much improved focus, concentration, task completion.  Less frustration, better multitasking, less impulsivity and restlessness.  Mood is stable. No side effects from the medication.  GERD: has EGD coming up soon to dilate an esoph stricture, still taking PPI qd. Dietary adjustments have helped.  HTN: home bp's consistently <130/80  Dep/anx: takes sertraline regularly and mood/anx stable. Takes diazepam prn migraine HA. No further tramadol for RA/psoriatic/pseudogout arthritis b/c it did not work.  She had an unexplained episode of syncope 5 d/a, fell onto ground and hit R orbital area and R shoulder, elbow, knee--bruising everywhere.  No resuscitation efforts were required,  unknown duration of LOC but likely only <15 sec.  She does not recall tripping.  No preceding palpitations or dizziness.  No CP or SOB.  Her glucose that morning was 120s.  Blood pressures have been good.  No otc or herbal med taken.  Since the episode when she moves her head too quick she gets brief vertigo.  Some mild sleep impairment.  NO HAs.  No vision changes.  Slept a lot in daytime for a couple days.  She does feel better gradually.  No nausea.  No ataxia.     PMP AWARE reviewed today: most recent rx for adderall was filled 02/22/20, # 30, rx by me. Most recent rx for diaz filled 10/29/19, #60, by me. Most recent rx for tramadol filled 11/22/19, #30, rx by me. No red flags.  ROS: no fevers, no CP, no SOB, no wheezing, no cough, no dizziness, no HAs, no rashes, no melena/hematochezia.  No polyuria or polydipsia.  No myalgias or arthralgias.  No focal weakness, paresthesias, or tremors.  No acute vision or hearing abnormalities. No n/v/d or abd pain.  No palpitations.    Past Medical History:  Diagnosis Date  . Acute medial meniscus tear of right knee   . Anemia of chronic disease    Mild, Hb stable consistently  . Anxiety and depression   . Chest pain 10/2017   +Cardiac CT.  Myocardial perfusion imaging NORMAL, EF>65%  . Diabetes mellitus 1995   Managed by Dr. Elvera Lennox (Endo)  . GERD (  gastroesophageal reflux disease) per pt watches diet and take tums as needed  . H/O hiatal hernia    slightly larger ("moderate" size) on CT angio done to r/o PE 10/2017.  Marland Kitchen Hyperlipidemia   . Hypertension   . Interstitial cystitis   . Lichen sclerosus of female genitalia   . Migraine   . Osteoarthritis, multiple sites   . Palpitations 10/2017   3 wk event monitor: symptoms correlate with sinus rhythm with PACs, at times runs of PACs up to 5 beats.  . Pseudogout of knee, left    colchicine helpful  . Psoriasis   . Seasonal allergies   . Seropositive rheumatoid arthritis (HCC)    Hands, knees,  jaw, elbow: responding to Simponi as of 09/29/15 rheum f/u.  Waning effectiveness on 03/2016 f/u so pt switched to Orencia injections.  Doing well on chronic low-dose prednisone therapy+ methotrexate +orencia as of 01/2019, 08/2019, 11/2019    Past Surgical History:  Procedure Laterality Date  . ABDOMINAL HYSTERECTOMY  1991   for fibroids; ovaries still in  . BREAST BIOPSY  2001; 02/12/16   fibrocystic breast disease in 2001 and again in 02/2016 (+scar from prev bx site w/calcifications).  . CARDIOVASCULAR STRESS TEST  11/01/2017   Myocard perf imaging: NORMAL (EF >60-65%)  . COLONOSCOPY  09/12/2013; 02/25/20   Normal 2015 and 2021: Recall 5 yrs ([facility], Dr. Evette Cristal) due to FH of colon polyps.  Manfred Shirts   for chronic UTI  . DEXA  10/2016   Bone density normal (T-score 0.0)  . ESOPHAGOGASTRODUODENOSCOPY  04/19/2011   Nl except antral gastritis: bx = mild chron gastritis (H pyloria neg)   . EVENT MONITOR  10/2017   3 wk-->Symptoms correlate with sinus rhythm with PACs, at times runs of PACs up to 5 beats.  . intraarticular steroid injection  2013   3 R knee & L X 1; Dr Netta Corrigan  . KNEE ARTHROCENTESIS  2013   R knee x 3 & L X 1  . KNEE ARTHROSCOPY  10/21/2011   Procedure: ARTHROSCOPY KNEE;  Surgeon: Jacki Cones, MD;  Location: [facility];  Service: Orthopedics;  Laterality: Right;  WITH MEDIAL and lateral shaving of femoral chondyl  with microfracture technique of lateral and medial femoral chondyl suprapatellar synovectomy  . LAPAROSCOPIC CHOLECYSTECTOMY  01/2018  . TOTAL KNEE ARTHROPLASTY  04/12/2012   Procedure: TOTAL KNEE ARTHROPLASTY;  Surgeon: Jacki Cones, MD;  Location: [facility];  Service: Orthopedics;  Laterality: Right;  Right Total Knee Arthroplasty  . TUBAL LIGATION  1981    Outpatient Medications Prior to Visit  Medication Sig Dispense Refill  . Abatacept (ORENCIA) 125 MG/ML SOSY Inject 125 mg into the skin every Monday.    . Colchicine (MITIGARE)  0.6 MG CAPS Take 1.2 mg by mouth daily.    . diazepam (VALIUM) 5 MG tablet Take 1 tablet (5 mg total) by mouth every 12 (twelve) hours as needed for Migraine headaches (Patient taking differently: Take 5 mg by mouth 2 (two) times daily as needed (migraines). ) 60 tablet 5  . folic acid (FOLVITE) 1 MG tablet Take 1 mg by mouth daily.    Marland Kitchen ibuprofen (ADVIL) 200 MG tablet Take 400 mg by mouth every 8 (eight) hours as needed (pain/headaches.).    Marland Kitchen lisinopril (ZESTRIL) 20 MG tablet Take 0.5 tablets (10 mg total) by mouth daily. 45 tablet 3  . metFORMIN (GLUCOPHAGE-XR) 500 MG 24 hr tablet Take 1 tablet (500  mg total) by mouth 2 (two) times daily with a meal. 180 tablet 3  . omeprazole (PRILOSEC) 40 MG capsule TAKE ONE CAPSULE BY MOUTH EVERY DAY (Patient taking differently: Take 40 mg by mouth daily before breakfast. ) 30 capsule 3  . predniSONE (DELTASONE) 5 MG tablet Take 5 mg by mouth daily.    . primidone (MYSOLINE) 50 MG tablet Take 50-100 mg by mouth See admin instructions. Take 1 tablet (50 mg) by mouth in the morning & take 2 tablets (100 mg) by mouth in the evening.    . rosuvastatin (CRESTOR) 10 MG tablet Take 1 tablet (10 mg total) by mouth daily. (Patient taking differently: Take 10 mg by mouth at bedtime. ) 90 tablet 3  . Semaglutide,0.25 or 0.5MG /DOS, (OZEMPIC, 0.25 OR 0.5 MG/DOSE,) 2 MG/1.5ML SOPN Inject 0.5 mg into the skin once a week. (Patient taking differently: Inject 0.5 mg into the skin every Monday. ) 3 pen 3  . sertraline (ZOLOFT) 100 MG tablet Take 1 tablet (100 mg total) by mouth daily. 90 tablet 3  . amphetamine-dextroamphetamine (ADDERALL XR) 15 MG 24 hr capsule TAKE ONE CAPSULE BY MOUTH EVERY MORNING (Patient taking differently: Take 15 mg by mouth daily. ) 30 capsule 0  . cetirizine (ZYRTEC) 10 MG tablet Take 10 mg by mouth daily.     . Cholecalciferol (VITAMIN D3) 1.25 MG (50000 UT) CAPS Take by mouth daily. (Patient not taking: Reported on 03/21/2020)    . Methotrexate Sodium  (METHOTREXATE PO) Inject 1 mL as directed every Wednesday.  (Patient not taking: Reported on 03/21/2020)    . Omega-3 Fatty Acids (FISH OIL) 1000 MG CAPS Take by mouth 2 (two) times daily. (Patient not taking: Reported on 03/21/2020)    . PEG 3350-KCl-NaBcb-NaCl-NaSulf (PEG-3350/ELECTROLYTES) 236 g SOLR Take 236 g by mouth as directed.  (Patient not taking: Reported on 03/21/2020)    . traMADol (ULTRAM) 50 MG tablet 1-2 tabs po tid prn pain (Patient not taking: Reported on 12/14/2019) 30 tablet 1   No facility-administered medications prior to visit.    Allergies  Allergen Reactions  . Penicillins Rash  . Pravastatin     Myalgias  . Simvastatin     Myalgias  . Hydrocodone-Acetaminophen Nausea And Vomiting    ROS As per HPI  PE: Vitals with BMI 03/21/2020 12/14/2019 11/26/2019  Height 5\' 8"  5\' 8"  5\' 8"   Weight 171 lbs 3 oz 166 lbs 166 lbs  BMI 26.04 25.25 25.25  Systolic 129 122 744  Diastolic 84 84 80  Pulse 66 81 86     Gen: Alert, well appearing.  Patient is oriented to person, place, time, and situation. AFFECT: pleasant, lucid thought and speech. CV: RRR, no m/r/g.   LUNGS: CTA bilat, nonlabored resps, good aeration in all lung fields. EXT: no clubbing or cyanosis.  no edema.  Neuro: CN 2-12 intact bilaterally, strength 5/5 in proximal and distal upper extremities and lower extremities bilaterally.  No sensory deficits.  No tremor.  No disdiadochokinesis.  No ataxia.  Upper extremity and lower extremity DTRs symmetric.  No pronator drift. She had a small bruise w/out swelling/hematoma on R peri-orbital superomedial aspect, R shoulder, and R knee.    LABS:  Lab Results  Component Value Date   TSH 1.37 11/09/2019   Lab Results  Component Value Date   WBC 6.5 11/09/2019   HGB 12.4 11/09/2019   HCT 36.9 11/09/2019   MCV 85.2 11/09/2019   PLT 220.0 11/09/2019   Lab Results  Component Value Date   CREATININE 0.78 11/09/2019   BUN 13 11/09/2019   NA 137 11/09/2019   K  4.0 11/09/2019   CL 104 11/09/2019   CO2 24 11/09/2019   Lab Results  Component Value Date   ALT 59 (H) 11/09/2019   AST 43 (H) 11/09/2019   ALKPHOS 58 11/09/2019   BILITOT 0.6 11/09/2019   Lab Results  Component Value Date   CHOL 193 11/09/2019   Lab Results  Component Value Date   HDL 47.80 11/09/2019   Lab Results  Component Value Date   LDLCALC 119 (H) 11/09/2019   Lab Results  Component Value Date   TRIG 129.0 11/09/2019   Lab Results  Component Value Date   CHOLHDL 4 11/09/2019   Lab Results  Component Value Date   HGBA1C 6.6 (A) 11/26/2019   12 lead EKG today: NSR, rate 64, no ectopy, no ischemic changes, no hypertrophy.  Intervals and duration normal.  No change compared to EKG 10/05/17  IMPRESSION AND PLAN:  1) Mild concussion with brief LOC: resolving. No imaging indicated.   Signs/symptoms to call or return for were reviewed and pt expressed understanding.  2) Syncope-?cardiac->check mag,lytes,cbc.  EKG normal here todya, have her return to see Dr. Wyline Mood for consideration of long term rhythm monitoring.  3) ADD: stable.  I did electronic rx's for adderall xr 15mg , 1 qd, #30 for each of the next 3 mo today.  Appropriate fill on/after date was noted on each rx.  4) Recurrent MDD: stable.  No changes.  5) HTN: The current medical regimen is effective;  continue present plan and medications. Lytes/cr today.  6) HLD: tolerating statin.  FLP and hepatic panel today.  7) GERD: PPI qd long term, has esoph stricture and will be getting this dilated very soon.  An After Visit Summary was printed and given to the patient.  FOLLOW UP: Return in about 3 months (around 06/20/2020) for routine chronic illness f/u.  Signed:  14/04/2020, MD           03/21/2020

## 2020-03-23 LAB — DRUG MONITORING, PANEL 8 WITH CONFIRMATION, URINE
6 Acetylmorphine: NEGATIVE ng/mL (ref <10)
Alcohol Metabolites: NEGATIVE ng/mL
Alphahydroxyalprazolam: NEGATIVE ng/mL (ref <25)
Alphahydroxymidazolam: NEGATIVE ng/mL (ref <50)
Alphahydroxytriazolam: NEGATIVE ng/mL (ref <50)
Aminoclonazepam: NEGATIVE ng/mL (ref <25)
Amphetamines: NEGATIVE ng/mL (ref <500)
Benzodiazepines: POSITIVE ng/mL — AB (ref <100)
Buprenorphine, Urine: NEGATIVE ng/mL (ref <5)
Cocaine Metabolite: NEGATIVE ng/mL (ref <150)
Creatinine: 151.5 mg/dL
Hydroxyethylflurazepam: NEGATIVE ng/mL (ref <50)
Lorazepam: NEGATIVE ng/mL (ref <50)
MDMA: NEGATIVE ng/mL (ref <500)
Marijuana Metabolite: NEGATIVE ng/mL (ref <20)
Nordiazepam: 77 ng/mL — ABNORMAL HIGH (ref <50)
Opiates: NEGATIVE ng/mL (ref <100)
Oxazepam: 272 ng/mL — ABNORMAL HIGH (ref <50)
Oxidant: NEGATIVE ug/mL
Oxycodone: NEGATIVE ng/mL (ref <100)
Temazepam: 492 ng/mL — ABNORMAL HIGH (ref <50)
pH: 5.6 (ref 4.5–9.0)

## 2020-03-23 LAB — DM TEMPLATE

## 2020-03-24 ENCOUNTER — Other Ambulatory Visit (HOSPITAL_COMMUNITY)
Admission: RE | Admit: 2020-03-24 | Discharge: 2020-03-24 | Disposition: A | Discharge status: Home / Self Care | Payer: 59 | Source: Ambulatory Visit | Attending: Gastroenterology | Admitting: Gastroenterology

## 2020-03-24 ENCOUNTER — Other Ambulatory Visit: Payer: Self-pay

## 2020-03-24 DIAGNOSIS — Z20822 Contact with and (suspected) exposure to covid-19: Secondary | ICD-10-CM | POA: Insufficient documentation

## 2020-03-24 DIAGNOSIS — Z01812 Encounter for preprocedural laboratory examination: Secondary | ICD-10-CM | POA: Insufficient documentation

## 2020-03-24 MED ORDER — ROSUVASTATIN CALCIUM 20 MG PO TABS: 20.0000 mg | ORAL_TABLET | Freq: Every day | ORAL | 3 refills | Status: DC

## 2020-03-25 ENCOUNTER — Encounter: Payer: Self-pay | Admitting: Cardiology

## 2020-03-25 ENCOUNTER — Ambulatory Visit (INDEPENDENT_AMBULATORY_CARE_PROVIDER_SITE_OTHER): Payer: 59 | Admitting: Cardiology

## 2020-03-25 ENCOUNTER — Other Ambulatory Visit: Payer: Self-pay

## 2020-03-25 VITALS — BP 132/87 | HR 71 | Ht 68.0 in | Wt 172.0 lb

## 2020-03-25 DIAGNOSIS — R55 Syncope and collapse: Secondary | ICD-10-CM

## 2020-03-25 LAB — SARS CORONAVIRUS 2 (TAT 6-24 HRS): SARS Coronavirus 2: NEGATIVE

## 2020-03-25 NOTE — Addendum Note (Signed)
Addended by: Marlyn Corporal A on: 03/25/2020 09:45 AM   Modules accepted: Orders

## 2020-03-25 NOTE — Patient Instructions (Addendum)
Medication Instructions:  *If you need a refill on your cardiac medications before your next appointment, please call your pharmacy*     Testing/Procedures: Your physician has recommended that you wear an event monitor. Event monitors are medical devices that record the heart's electrical activity. Doctors most often [place] these monitors to diagnose arrhythmias. Arrhythmias are problems with the speed or rhythm of the heartbeat. The monitor is a small, portable device. You can wear one while you do your normal daily activities. This is usually used to diagnose what is causing palpitations/syncope (passing out).  30 days.   Follow-Up: At [facility], you and your health needs are our priority.  As part of our continuing mission to provide you with exceptional heart care, we have created designated Provider Care Teams.  These Care Teams include your primary Cardiologist (physician) and Advanced Practice Providers (APPs -  Physician Assistants and Nurse Practitioners) who all work together to provide you with the care you need, when you need it.  We recommend signing up for the patient portal called "MyChart".  Sign up information is provided on this After Visit Summary.  MyChart is used to connect with patients for Virtual Visits (Telemedicine).  Patients are able to view lab/test results, encounter notes, upcoming appointments, etc.  Non-urgent messages can be sent to your provider as well.   To learn more about what you can do with MyChart, go to [email or web address].    Your next appointment:   6 week(s)  The format for your next appointment:   In Person  Provider:   You may see Dina Rich, MD or one of the following Advanced Practice Providers on your designated Care Team:    Randall An, PA-C   Jacolyn Reedy, [place]     Other Instructions

## 2020-03-25 NOTE — Progress Notes (Signed)
Clinical Summary Ms. Scritchfield is a 65 y.o.female seen today for follow up of the following medical problems.   1. Syncope - episode about 1 week ago - had one to church, walking on side walk.Next she remembers she was laying on ground - when she came to some church goers who were retired EMTs were evaluteing her.Struck her head.   - orhthostatics negative in clinic - rare infrequent dizziness - has palpitations, increased frequency. Occurs few times a week.Prior monitor in 2019 was benign      Past Medical History:  Diagnosis Date  . Acute medial meniscus tear of right knee   . Anemia of chronic disease    Mild, Hb stable consistently  . Anxiety and depression   . Chest pain 10/2017   +Cardiac CT.  Myocardial perfusion imaging NORMAL, EF>65%  . Diabetes mellitus 1995   Managed by Dr. Elvera Lennox (Endo)  . GERD (gastroesophageal reflux disease) per pt watches diet and take tums as needed  . H/O hiatal hernia    slightly larger ("moderate" size) on CT angio done to r/o PE 10/2017.  Marland Kitchen Hyperlipidemia   . Hypertension   . Interstitial cystitis   . Lichen sclerosus of female genitalia   . Migraine   . Osteoarthritis, multiple sites   . Palpitations 10/2017   3 wk event monitor: symptoms correlate with sinus rhythm with PACs, at times runs of PACs up to 5 beats.  . Pseudogout of knee, left    colchicine helpful  . Psoriasis   . Seasonal allergies   . Seropositive rheumatoid arthritis (HCC)    Hands, knees, jaw, elbow: responding to Simponi as of 09/29/15 rheum f/u.  Waning effectiveness on 03/2016 f/u so pt switched to Orencia injections.  Doing well on chronic low-dose prednisone therapy+ methotrexate +orencia as of 01/2019, 08/2019, 11/2019     Allergies  Allergen Reactions  . Penicillins Rash  . Pravastatin     Myalgias  . Simvastatin     Myalgias  . Hydrocodone-Acetaminophen Nausea And Vomiting     Current Outpatient Medications  Medication Sig Dispense Refill    . Abatacept (ORENCIA) 125 MG/ML SOSY Inject 125 mg into the skin every Monday.    Marland Kitchen amphetamine-dextroamphetamine (ADDERALL XR) 15 MG 24 hr capsule TAKE ONE CAPSULE BY MOUTH EVERY MORNING 30 capsule 0  . cetirizine (ZYRTEC) 10 MG tablet Take 10 mg by mouth daily.     . Cholecalciferol (VITAMIN D3) 1.25 MG (50000 UT) CAPS Take by mouth daily. (Patient not taking: Reported on 03/21/2020)    . Colchicine (MITIGARE) 0.6 MG CAPS Take 1.2 mg by mouth daily.    . diazepam (VALIUM) 5 MG tablet Take 1 tablet (5 mg total) by mouth every 12 (twelve) hours as needed for Migraine headaches (Patient taking differently: Take 5 mg by mouth 2 (two) times daily as needed (migraines). ) 60 tablet 5  . folic acid (FOLVITE) 1 MG tablet Take 1 mg by mouth daily.    Marland Kitchen ibuprofen (ADVIL) 200 MG tablet Take 400 mg by mouth every 8 (eight) hours as needed (pain/headaches.).    Marland Kitchen lisinopril (ZESTRIL) 20 MG tablet Take 0.5 tablets (10 mg total) by mouth daily. 45 tablet 3  . metFORMIN (GLUCOPHAGE-XR) 500 MG 24 hr tablet Take 1 tablet (500 mg total) by mouth 2 (two) times daily with a meal. 180 tablet 3  . Methotrexate Sodium (METHOTREXATE PO) Inject 1 mL as directed every Wednesday.  (Patient not taking: Reported on  03/21/2020)    . Omega-3 Fatty Acids (FISH OIL) 1000 MG CAPS Take by mouth 2 (two) times daily. (Patient not taking: Reported on 03/21/2020)    . omeprazole (PRILOSEC) 40 MG capsule TAKE ONE CAPSULE BY MOUTH EVERY DAY (Patient taking differently: Take 40 mg by mouth daily before breakfast. ) 30 capsule 3  . predniSONE (DELTASONE) 5 MG tablet Take 5 mg by mouth daily.    . primidone (MYSOLINE) 50 MG tablet Take 50-100 mg by mouth See admin instructions. Take 1 tablet (50 mg) by mouth in the morning & take 2 tablets (100 mg) by mouth in the evening.    . rosuvastatin (CRESTOR) 20 MG tablet Take 1 tablet (20 mg total) by mouth daily. 30 tablet 3  . Semaglutide,0.25 or 0.5MG /DOS, (OZEMPIC, 0.25 OR 0.5 MG/DOSE,) 2 MG/1.5ML  SOPN Inject 0.5 mg into the skin once a week. (Patient taking differently: Inject 0.5 mg into the skin every Monday. ) 3 pen 3  . sertraline (ZOLOFT) 100 MG tablet Take 1 tablet (100 mg total) by mouth daily. 90 tablet 3   No current facility-administered medications for this visit.     Past Surgical History:  Procedure Laterality Date  . ABDOMINAL HYSTERECTOMY  1991   for fibroids; ovaries still in  . BREAST BIOPSY  2001; 02/12/16   fibrocystic breast disease in 2001 and again in 02/2016 (+scar from prev bx site w/calcifications).  . CARDIOVASCULAR STRESS TEST  11/01/2017   Myocard perf imaging: NORMAL (EF >60-65%)  . COLONOSCOPY  09/12/2013; 02/25/20   Normal 2015 and 2021: Recall 5 yrs ([facility], Dr. Evette Cristal) due to FH of colon polyps.  Manfred Shirts   for chronic UTI  . DEXA  10/2016   Bone density normal (T-score 0.0)  . ESOPHAGOGASTRODUODENOSCOPY  04/19/2011   Nl except antral gastritis: bx = mild chron gastritis (H pyloria neg)   . EVENT MONITOR  10/2017   3 wk-->Symptoms correlate with sinus rhythm with PACs, at times runs of PACs up to 5 beats.  . intraarticular steroid injection  2013   3 R knee & L X 1; Dr Netta Corrigan  . KNEE ARTHROCENTESIS  2013   R knee x 3 & L X 1  . KNEE ARTHROSCOPY  10/21/2011   Procedure: ARTHROSCOPY KNEE;  Surgeon: Jacki Cones, MD;  Location: [facility];  Service: Orthopedics;  Laterality: Right;  WITH MEDIAL and lateral shaving of femoral chondyl  with microfracture technique of lateral and medial femoral chondyl suprapatellar synovectomy  . LAPAROSCOPIC CHOLECYSTECTOMY  01/2018  . TOTAL KNEE ARTHROPLASTY  04/12/2012   Procedure: TOTAL KNEE ARTHROPLASTY;  Surgeon: Jacki Cones, MD;  Location: [facility];  Service: Orthopedics;  Laterality: Right;  Right Total Knee Arthroplasty  . TUBAL LIGATION  1981     Allergies  Allergen Reactions  . Penicillins Rash  . Pravastatin     Myalgias  . Simvastatin     Myalgias  .  Hydrocodone-Acetaminophen Nausea And Vomiting      Family History  Problem Relation Age of Onset  . Diabetes Mother   . Stroke Mother 54  . Osteoarthritis Mother   . Stroke Brother 43  . Heart disease Father        CABG  . Lung cancer Father   . Prostate cancer Father   . Pancreatic cancer Father   . Pancreatic cancer Paternal Grandfather   . Bipolar disorder Daughter   . Anxiety disorder Son   .  Arthritis Maternal Grandmother        rheumatoid  . Heart attack Brother 56       smoker     Social History Ms. Willenberg reports that she has never smoked. She has never used smokeless tobacco. Ms. Wade reports no history of alcohol use.   Review of Systems CONSTITUTIONAL: No weight loss, fever, chills, weakness or fatigue.  HEENT: Eyes: No visual loss, blurred vision, double vision or yellow sclerae.No hearing loss, sneezing, congestion, runny nose or sore throat.  SKIN: No rash or itching.  CARDIOVASCULAR: per hpi RESPIRATORY: No shortness of breath, cough or sputum.  GASTROINTESTINAL: No anorexia, nausea, vomiting or diarrhea. No abdominal pain or blood.  GENITOURINARY: No burning on urination, no polyuria NEUROLOGICAL: No headache, dizziness, syncope, paralysis, ataxia, numbness or tingling in the extremities. No change in bowel or bladder control.  MUSCULOSKELETAL: No muscle, back pain, joint pain or stiffness.  LYMPHATICS: No enlarged nodes. No history of splenectomy.  PSYCHIATRIC: No history of depression or anxiety.  ENDOCRINOLOGIC: No reports of sweating, cold or heat intolerance. No polyuria or polydipsia.  Marland Kitchen   Physical Examination Today's Vitals   03/25/20 0803  BP: 132/87  Pulse: 71  SpO2: 100%  Weight: 172 lb (78 kg)  Height: 5\' 8"  (1.727 m)   Body mass index is 26.15 kg/m.  Gen: resting comfortably, no acute distress HEENT: no scleral icterus, pupils equal round and reactive, no palptable cervical adenopathy,  CV: RRR, no m/r/g, no jvd Resp:  Clear to auscultation bilaterally GI: abdomen is soft, non-tender, non-distended, normal bowel sounds, no hepatosplenomegaly MSK: extremities are warm, no edema.  Skin: warm, no rash Neuro:  no focal deficits Psych: appropriate affect   Diagnostic Studies  10/2017 nuclear stress  There was no ST segment deviation noted during stress.  The study is normal. There are no perfusion defects  This is a low risk study.  The left ventricular ejection fraction is hyperdynamic (>65%).   11/2017 Monitor  21 day event monitor  Min HR 53, Max HR 146, Avg HR 76  Symptoms correlate with sinus rhythm with PACs, at times runs of PACs up to 5 beats      Assessment and Plan   1. Syncope - unclear etiology - orthostatics are negative in clinic - no prodrome, raises question about possible cardiogenic syncope - EKG from pcp shows NSR - will obtain 30 day event monitor to further evaluate   Antoine Poche, M.D

## 2020-03-27 ENCOUNTER — Ambulatory Visit (HOSPITAL_COMMUNITY): Payer: 59 | Admitting: Anesthesiology

## 2020-03-27 ENCOUNTER — Encounter (HOSPITAL_COMMUNITY): Payer: Self-pay | Admitting: Gastroenterology

## 2020-03-27 ENCOUNTER — Other Ambulatory Visit: Payer: Self-pay

## 2020-03-27 ENCOUNTER — Ambulatory Visit (HOSPITAL_COMMUNITY): Payer: 59

## 2020-03-27 ENCOUNTER — Ambulatory Visit (HOSPITAL_COMMUNITY)
Admission: RE | Admit: 2020-03-27 | Discharge: 2020-03-27 | Disposition: A | Discharge status: Home / Self Care | Payer: 59 | Attending: Gastroenterology | Admitting: Gastroenterology

## 2020-03-27 ENCOUNTER — Encounter (HOSPITAL_COMMUNITY)
Admission: RE | Disposition: A | Discharge status: Home / Self Care | Payer: Self-pay | Source: Home / Self Care | Attending: Gastroenterology

## 2020-03-27 DIAGNOSIS — Z888 Allergy status to other drugs, medicaments and biological substances status: Secondary | ICD-10-CM | POA: Diagnosis not present

## 2020-03-27 DIAGNOSIS — Z90710 Acquired absence of both cervix and uterus: Secondary | ICD-10-CM | POA: Diagnosis not present

## 2020-03-27 DIAGNOSIS — E785 Hyperlipidemia, unspecified: Secondary | ICD-10-CM | POA: Insufficient documentation

## 2020-03-27 DIAGNOSIS — K222 Esophageal obstruction: Secondary | ICD-10-CM

## 2020-03-27 DIAGNOSIS — L4050 Arthropathic psoriasis, unspecified: Secondary | ICD-10-CM | POA: Insufficient documentation

## 2020-03-27 DIAGNOSIS — K449 Diaphragmatic hernia without obstruction or gangrene: Secondary | ICD-10-CM | POA: Diagnosis not present

## 2020-03-27 DIAGNOSIS — Z885 Allergy status to narcotic agent status: Secondary | ICD-10-CM | POA: Insufficient documentation

## 2020-03-27 DIAGNOSIS — Z880 Allergy status to penicillin: Secondary | ICD-10-CM | POA: Diagnosis not present

## 2020-03-27 DIAGNOSIS — E119 Type 2 diabetes mellitus without complications: Secondary | ICD-10-CM | POA: Insufficient documentation

## 2020-03-27 DIAGNOSIS — R1310 Dysphagia, unspecified: Secondary | ICD-10-CM | POA: Diagnosis present

## 2020-03-27 DIAGNOSIS — Z96651 Presence of right artificial knee joint: Secondary | ICD-10-CM | POA: Insufficient documentation

## 2020-03-27 DIAGNOSIS — I10 Essential (primary) hypertension: Secondary | ICD-10-CM | POA: Diagnosis not present

## 2020-03-27 HISTORY — PX: SAVORY DILATION: SHX5439

## 2020-03-27 HISTORY — PX: ESOPHAGOGASTRODUODENOSCOPY (EGD) WITH PROPOFOL: SHX5813

## 2020-03-27 LAB — GLUCOSE, CAPILLARY: Glucose-Capillary: 127 mg/dL — ABNORMAL HIGH (ref 70–99)

## 2020-03-27 SURGERY — ESOPHAGOGASTRODUODENOSCOPY (EGD) WITH PROPOFOL
Anesthesia: Monitor Anesthesia Care

## 2020-03-27 MED ORDER — LACTATED RINGERS IV SOLN
INTRAVENOUS | Status: DC
  Administered 2020-03-27: 1000 mL via INTRAVENOUS

## 2020-03-27 MED ORDER — SODIUM CHLORIDE 0.9 % IV SOLN: INTRAVENOUS | Status: DC

## 2020-03-27 MED ORDER — LIDOCAINE HCL (CARDIAC) PF 100 MG/5ML IV SOSY
PREFILLED_SYRINGE | INTRAVENOUS | Status: DC | PRN
  Administered 2020-03-27: 80 mg via INTRAVENOUS

## 2020-03-27 MED ORDER — PROPOFOL 500 MG/50ML IV EMUL
INTRAVENOUS | Status: DC | PRN
  Administered 2020-03-27: 140 ug/kg/min via INTRAVENOUS

## 2020-03-27 MED ORDER — PROPOFOL 10 MG/ML IV BOLUS
INTRAVENOUS | Status: DC | PRN
  Administered 2020-03-27: 40 mg via INTRAVENOUS
  Administered 2020-03-27: 10 mg via INTRAVENOUS
  Administered 2020-03-27: 30 mg via INTRAVENOUS
  Administered 2020-03-27: 20 mg via INTRAVENOUS
  Administered 2020-03-27: 40 mg via INTRAVENOUS
  Administered 2020-03-27 (×2): 30 mg via INTRAVENOUS
  Administered 2020-03-27: 20 mg via INTRAVENOUS

## 2020-03-27 SURGICAL SUPPLY — 14 items

## 2020-03-27 NOTE — H&P (Signed)
Updated H&P for outpatient endoscopic procedure  History the patient is a 64 year old female who has been experiencing dysphagia for the past couple of months choking on larger pills and food.  She feels as though these get stuck in the throat area.  A barium swallow was done showing a smooth narrowing in the proximal esophagus.  We discussed EGD with dilation  Past history hyperlipidemia, migraine headaches, type 2 diabetes, esophageal reflux, hypertension, psoriatic arthritis, pseudogout, interstitial cystitis  Surgical history tubal ligation, hysterectomy, right knee replacement  Family history noncontributory to present problem  Social history negative for alcohol or tobacco  Allergies penicillin codeine Vicodin meloxicam  Medications reviewed  Physical:  No distress  Heart regular rhythm no murmurs  Lungs clear  Abdomen soft and nontender  Impression: Dysphagia with smooth stricture in proximal esophagus noted on barium swallow  Plan endoscopy with possible dilatation  Procedure explained along with potential risks of bleeding, infection, perforation.

## 2020-03-27 NOTE — Anesthesia Postprocedure Evaluation (Signed)
Anesthesia Post Note  Patient: Raven [name]  Procedure(s) Performed: ESOPHAGOGASTRODUODENOSCOPY (EGD) WITH PROPOFOL  (N/A ) SAVORY DILATION (N/A )     Patient location during evaluation: PACU Anesthesia Type: MAC Level of consciousness: awake and alert Pain management: pain level controlled Vital Signs Assessment: post-procedure vital signs reviewed and stable Respiratory status: spontaneous breathing, nonlabored ventilation, respiratory function stable and patient connected to nasal cannula oxygen Cardiovascular status: stable and blood pressure returned to baseline Postop Assessment: no apparent nausea or vomiting Anesthetic complications: no   No complications documented.  Last Vitals:  Vitals:   03/27/20 1410 03/27/20 1420  BP: 123/69 130/66  Pulse: 73 74  Resp: 16 14  Temp:    SpO2: 99% 100%    Last Pain:  Vitals:   03/27/20 1420  TempSrc:   PainSc: 0-No pain                 Belenda Cruise P Thana Ramp

## 2020-03-27 NOTE — Anesthesia Preprocedure Evaluation (Addendum)
Anesthesia Evaluation  Patient identified by MRN, date of birth, ID band Patient awake    Reviewed: Allergy & Precautions, NPO status , Patient's Chart, lab work & pertinent test results  History of Anesthesia Complications Negative for: history of anesthetic complications  Airway Mallampati: II  TM Distance: >3 FB Neck ROM: Full    Dental no notable dental hx. (+) Dental Advisory Given   Pulmonary neg pulmonary ROS,    Pulmonary exam normal        Cardiovascular hypertension, Pt. on medications Normal cardiovascular exam     Neuro/Psych PSYCHIATRIC DISORDERS Anxiety Depression    GI/Hepatic Neg liver ROS, hiatal hernia, GERD  ,  Endo/Other  diabetes  Renal/GU negative Renal ROS     Musculoskeletal  (+) Arthritis , Rheumatoid disorders,    Abdominal   Peds  Hematology negative hematology ROS (+)   Anesthesia Other Findings   Reproductive/Obstetrics                            Anesthesia Physical Anesthesia Plan  ASA: III  Anesthesia Plan: MAC   Post-op Pain Management:    Induction:   PONV Risk Score and Plan: 2 and Ondansetron and Propofol infusion  Airway Management Planned: Natural Airway  Additional Equipment:   Intra-op Plan:   Post-operative Plan:   Informed Consent: I have reviewed the patients History and Physical, chart, labs and discussed the procedure including the risks, benefits and alternatives for the proposed anesthesia with the patient or authorized representative who has indicated his/her understanding and acceptance.     Dental advisory given  Plan Discussed with: Anesthesiologist and CRNA  Anesthesia Plan Comments:        Anesthesia Quick Evaluation

## 2020-03-27 NOTE — Discharge Instructions (Signed)

## 2020-03-27 NOTE — Transfer of Care (Signed)
Immediate Anesthesia Transfer of Care Note  Patient: Raven [name]  Procedure(s) Performed: ESOPHAGOGASTRODUODENOSCOPY (EGD) WITH PROPOFOL  (N/A ) SAVORY DILATION (N/A )  Patient Location: PACU and Endoscopy Unit  Anesthesia Type:MAC  Level of Consciousness: awake and drowsy  Airway & Oxygen Therapy: Patient Spontanous Breathing and Patient connected to face mask oxygen  Post-op Assessment: Report given to RN and Post -op Vital signs reviewed and stable  Post vital signs: Reviewed and stable  Last Vitals:  Vitals Value Taken Time  BP    Temp    Pulse    Resp    SpO2      Last Pain:  Vitals:   03/27/20 1115  TempSrc: Oral  PainSc: 0-No pain         Complications: No complications documented.

## 2020-03-27 NOTE — Op Note (Signed)
[facility] Patient Name: Raven [name] Procedure Date: 03/27/2020 MRN: [id number] Attending MD: Graylin Shiver , MD Date of Birth: May 21, 1956 CSN: [id number] Age: 64 Admit Type: Outpatient Procedure:                Upper GI endoscopy Indications:              Dysphagia, Stenosis of the proximal esophagus seen                            on barium swallow. Providers:                Graylin Shiver, MD, Rogue Jury, RN, Sunday Corn                            Mbumina, Technician Referring MD:              Medicines:                 Complications:            No immediate complications. Estimated Blood Loss:     Estimated blood loss was minimal. Procedure:                Pre-Anesthesia Assessment:                           - Prior to the procedure, a History and Physical                            was performed, and patient medications and                            allergies were reviewed. The patient's tolerance of                            previous anesthesia was also reviewed. The risks                            and benefits of the procedure and the sedation                            options and risks were discussed with the patient.                            All questions were answered, and informed consent                            was obtained. Prior Anticoagulants: The patient has                            taken no previous anticoagulant or antiplatelet                            agents. ASA Grade Assessment: II - A patient with  mild systemic disease. After reviewing the risks                            and benefits, the patient was deemed in                            satisfactory condition to undergo the procedure.                           After obtaining informed consent, the endoscope was                            passed under direct vision. Throughout the                            procedure, the patient's blood pressure,  pulse, and                            oxygen saturations were monitored continuously. The                            [id number]-[id number] ([id number]) was introduced through the                            mouth, and advanced to proximal esophagus at which                            point the scope would not advance beyond due to a                            proximal esophageal stenosis that was smooth and                            round and looks like a proximal esophageal web. .                            The upper GI endoscopy was accomplished without                            difficulty. The patient tolerated the procedure                            well. Scope In: Scope Out: Findings:      One moderate stenosis was found. The stenosis was traversed after       dilation. A guidewire was placed under fluoroscopic guidance and the       scope was withdrawn. Dilation was performed with a Savary dilator with       mild resistance at 10 mm and at 12.8 mm. The dilation site was examined       following endoscope reinsertion and showed moderate improvement in       luminal narrowing. There was some heme present. Estimated blood loss was       minimal.      A medium-sized hiatal hernia was present.  Normal mucosa was found in the entire examined stomach.      The examined duodenum was normal. Impression:               - Esophageal stenosis. Dilated.                           - Medium-sized hiatal hernia.                           - Normal mucosa was found in the entire stomach.                           - Normal examined duodenum.                           - No specimens collected. Moderate Sedation:      . Recommendation:           - Advance diet as tolerated.                           - Continue present medications. Watch for response. Procedure Code(s):        --- Professional ---                           [phone], Esophagogastroduodenoscopy, flexible,                            transoral; with  insertion of guide wire followed by                            passage of dilator(s) through esophagus over guide                            wire Diagnosis Code(s):        --- Professional ---                           K22.2, Esophageal obstruction                           K44.9, Diaphragmatic hernia without obstruction or                            gangrene                           R13.10, Dysphagia, unspecified CPT copyright 2019 American Medical Association. All rights reserved. The codes documented in this report are preliminary and upon coder review may  be revised to meet current compliance requirements. Graylin Shiver, MD 03/27/2020 2:22:36 PM This report has been signed electronically. Number of Addenda: 0

## 2020-03-28 ENCOUNTER — Encounter (HOSPITAL_COMMUNITY): Payer: Self-pay | Admitting: Gastroenterology

## 2020-04-02 ENCOUNTER — Other Ambulatory Visit: Payer: Self-pay

## 2020-04-02 ENCOUNTER — Encounter (INDEPENDENT_AMBULATORY_CARE_PROVIDER_SITE_OTHER): Payer: 59

## 2020-04-02 DIAGNOSIS — R55 Syncope and collapse: Secondary | ICD-10-CM | POA: Diagnosis not present

## 2020-04-04 ENCOUNTER — Other Ambulatory Visit: Payer: Self-pay | Admitting: Family Medicine

## 2020-04-04 NOTE — Telephone Encounter (Signed)
Requesting: Diazepam Contract:03/21/20 UDS:03/21/20 Last Visit:03/21/20 Next Visit:07/11/20 Last Refill:05/28/19(60,5)  Please Advise. Medication pending

## 2020-05-06 ENCOUNTER — Other Ambulatory Visit: Payer: Self-pay | Admitting: Internal Medicine

## 2020-05-09 ENCOUNTER — Ambulatory Visit (INDEPENDENT_AMBULATORY_CARE_PROVIDER_SITE_OTHER): Payer: 59 | Admitting: Cardiology

## 2020-05-09 ENCOUNTER — Encounter: Payer: Self-pay | Admitting: Cardiology

## 2020-05-09 ENCOUNTER — Other Ambulatory Visit: Payer: Self-pay

## 2020-05-09 VITALS — BP 110/72 | HR 76 | Ht 68.5 in | Wt 167.0 lb

## 2020-05-09 DIAGNOSIS — I471 Supraventricular tachycardia: Secondary | ICD-10-CM

## 2020-05-09 MED ORDER — METOPROLOL TARTRATE 25 MG PO TABS: 12.5000 mg | ORAL_TABLET | Freq: Two times a day (BID) | ORAL | 3 refills | Status: DC

## 2020-05-09 NOTE — Patient Instructions (Signed)
Medication Instructions:  START lopressor 12.5 mg twice a day   *If you need a refill on your cardiac medications before your next appointment, please call your pharmacy*   Lab Work: None today If you have labs (blood work) drawn today and your tests are completely normal, you will receive your results only by: Marland Kitchen MyChart Message (if you have MyChart) OR . A paper copy in the mail If you have any lab test that is abnormal or we need to change your treatment, we will call you to review the results.   Testing/Procedures: None today   Follow-Up: At [facility], you and your health needs are our priority.  As part of our continuing mission to provide you with exceptional heart care, we have created designated Provider Care Teams.  These Care Teams include your primary Cardiologist (physician) and Advanced Practice Providers (APPs -  Physician Assistants and Nurse Practitioners) who all work together to provide you with the care you need, when you need it.  We recommend signing up for the patient portal called "MyChart".  Sign up information is provided on this After Visit Summary.  MyChart is used to connect with patients for Virtual Visits (Telemedicine).  Patients are able to view lab/test results, encounter notes, upcoming appointments, etc.  Non-urgent messages can be sent to your provider as well.   To learn more about what you can do with MyChart, go to [email or web address].    Your next appointment:   4 month(s)  The format for your next appointment:   In Person  Provider:   Dina Rich, MD, Randall An, PA-C or Jacolyn Reedy, PA-C   Other Instructions None     Thank you for choosing [facility] !

## 2020-05-09 NOTE — Progress Notes (Signed)
Clinical Summary Ms. Bodenheimer is a 64 y.o.female seen today for follow up of the following medical problems.   1. Syncope - episode about 1 week ago - had one to church, walking on side walk.Next she remembers she was laying on ground - when she came to some church goers who were retired EMTs were evaluteing her.Struck her head.   - orhthostatics negative in clinic - rare infrequent dizziness - has palpitations, increased frequency. Occurs few times a week.Prior monitor in 2019 was benign  - event monitor showed runs of SVT up to 200bpm - stable palpitations, no recurrent syncope.     SH: works as Theme park manager Past Medical History:  Diagnosis Date  . Acute medial meniscus tear of right knee   . Anemia of chronic disease    Mild, Hb stable consistently  . Anxiety and depression   . Chest pain 10/2017   +Cardiac CT.  Myocardial perfusion imaging NORMAL, EF>65%  . Diabetes mellitus 1995   Managed by Dr. Elvera Lennox (Endo)  . GERD (gastroesophageal reflux disease) per pt watches diet and take tums as needed  . H/O hiatal hernia    slightly larger ("moderate" size) on CT angio done to r/o PE 10/2017.  Marland Kitchen Hyperlipidemia   . Hypertension   . Interstitial cystitis   . Lichen sclerosus of female genitalia   . Migraine   . Osteoarthritis, multiple sites   . Palpitations 10/2017   3 wk event monitor: symptoms correlate with sinus rhythm with PACs, at times runs of PACs up to 5 beats.  . Pseudogout of knee, left    colchicine helpful  . Psoriasis   . Seasonal allergies   . Seropositive rheumatoid arthritis (HCC)    Hands, knees, jaw, elbow: responding to Simponi as of 09/29/15 rheum f/u.  Waning effectiveness on 03/2016 f/u so pt switched to Orencia injections.  Doing well on chronic low-dose prednisone therapy+ methotrexate +orencia as of 01/2019, 08/2019, 11/2019     Allergies  Allergen Reactions  . Steri-Strip Compound Benzoin [Benzoin Compound] Anaphylaxis    Blisters and  itching  . Penicillins Rash  . Pravastatin     Myalgias  . Simvastatin     Myalgias  . Hydrocodone-Acetaminophen Nausea And Vomiting     Current Outpatient Medications  Medication Sig Dispense Refill  . Abatacept (ORENCIA) 125 MG/ML SOSY Inject 125 mg into the skin every Monday.    Marland Kitchen amphetamine-dextroamphetamine (ADDERALL XR) 15 MG 24 hr capsule TAKE ONE CAPSULE BY MOUTH EVERY MORNING 30 capsule 0  . cetirizine (ZYRTEC) 10 MG tablet Take 10 mg by mouth daily.     . Colchicine (MITIGARE) 0.6 MG CAPS Take 1.2 mg by mouth daily.    . diazepam (VALIUM) 5 MG tablet TAKE ONE TABLET BY MOUTH EVERY TWELVE HOURS AS NEEDED FOR MIGRAINE HEADACHES 60 tablet 5  . folic acid (FOLVITE) 1 MG tablet Take 1 mg by mouth daily.    Marland Kitchen ibuprofen (ADVIL) 200 MG tablet Take 400 mg by mouth every 8 (eight) hours as needed (pain/headaches.).    Marland Kitchen lisinopril (ZESTRIL) 20 MG tablet Take 0.5 tablets (10 mg total) by mouth daily. 45 tablet 3  . metFORMIN (GLUCOPHAGE-XR) 500 MG 24 hr tablet TAKE ONE TABLET BY MOUTH TWICE DAILY WITH A MEAL 180 tablet 3  . Methotrexate Sodium (METHOTREXATE PO) Inject 1 mL as directed every Wednesday.  (Patient not taking: Reported on 03/21/2020)    . Omega-3 Fatty Acids (FISH OIL) 1000 MG CAPS Take  by mouth 2 (two) times daily. (Patient not taking: Reported on 03/21/2020)    . omeprazole (PRILOSEC) 40 MG capsule TAKE ONE CAPSULE BY MOUTH EVERY DAY (Patient taking differently: Take 40 mg by mouth daily before breakfast. ) 30 capsule 3  . OZEMPIC, 0.25 OR 0.5 MG/DOSE, 2 MG/1.5ML SOPN Inject 0.5 mg into the skin once a week. 3 mL 3  . predniSONE (DELTASONE) 5 MG tablet Take 5 mg by mouth daily.    . primidone (MYSOLINE) 50 MG tablet Take 50-100 mg by mouth See admin instructions. Take 1 tablet (50 mg) by mouth in the morning & take 2 tablets (100 mg) by mouth in the evening.    . rosuvastatin (CRESTOR) 20 MG tablet Take 1 tablet (20 mg total) by mouth daily. 30 tablet 3  . sertraline (ZOLOFT)  100 MG tablet Take 1 tablet (100 mg total) by mouth daily. 90 tablet 3   No current facility-administered medications for this visit.     Past Surgical History:  Procedure Laterality Date  . ABDOMINAL HYSTERECTOMY  1991   for fibroids; ovaries still in  . BREAST BIOPSY  2001; 02/12/16   fibrocystic breast disease in 2001 and again in 02/2016 (+scar from prev bx site w/calcifications).  . CARDIOVASCULAR STRESS TEST  11/01/2017   Myocard perf imaging: NORMAL (EF >60-65%)  . COLONOSCOPY  09/12/2013; 02/25/20   Normal 2015 and 2021: Recall 5 yrs ([facility], Dr. Evette Cristal) due to FH of colon polyps.  Manfred Shirts   for chronic UTI  . DEXA  10/2016   Bone density normal (T-score 0.0)  . ESOPHAGOGASTRODUODENOSCOPY  04/19/2011   Nl except antral gastritis: bx = mild chron gastritis (H pyloria neg)   . ESOPHAGOGASTRODUODENOSCOPY (EGD) WITH PROPOFOL N/A 03/27/2020   Procedure: ESOPHAGOGASTRODUODENOSCOPY (EGD) WITH PROPOFOL ;  Surgeon: Graylin Shiver, MD;  Location: [facility];  Service: Endoscopy;  Laterality: N/A;  . EVENT MONITOR  10/2017   3 wk-->Symptoms correlate with sinus rhythm with PACs, at times runs of PACs up to 5 beats.  Marland Kitchen GALLBLADDER SURGERY  2019  . intraarticular steroid injection  2013   3 R knee & L X 1; Dr Netta Corrigan  . KNEE ARTHROCENTESIS  2013   R knee x 3 & L X 1  . KNEE ARTHROSCOPY  10/21/2011   Procedure: ARTHROSCOPY KNEE;  Surgeon: Jacki Cones, MD;  Location: [facility];  Service: Orthopedics;  Laterality: Right;  WITH MEDIAL and lateral shaving of femoral chondyl  with microfracture technique of lateral and medial femoral chondyl suprapatellar synovectomy  . LAPAROSCOPIC CHOLECYSTECTOMY  01/2018  . SAVORY DILATION N/A 03/27/2020   Procedure: SAVORY DILATION;  Surgeon: Graylin Shiver, MD;  Location: [facility];  Service: Endoscopy;  Laterality: N/A;  . TOTAL KNEE ARTHROPLASTY  04/12/2012   Procedure: TOTAL KNEE ARTHROPLASTY;  Surgeon: Jacki Cones, MD;  Location: [facility];  Service: Orthopedics;  Laterality: Right;  Right Total Knee Arthroplasty  . TUBAL LIGATION  1981     Allergies  Allergen Reactions  . Steri-Strip Compound Benzoin [Benzoin Compound] Anaphylaxis    Blisters and itching  . Penicillins Rash  . Pravastatin     Myalgias  . Simvastatin     Myalgias  . Hydrocodone-Acetaminophen Nausea And Vomiting      Family History  Problem Relation Age of Onset  . Diabetes Mother   . Stroke Mother 64  . Osteoarthritis Mother   . Stroke Brother 5  .  Heart disease Father        CABG  . Lung cancer Father   . Prostate cancer Father   . Pancreatic cancer Father   . Pancreatic cancer Paternal Grandfather   . Bipolar disorder Daughter   . Anxiety disorder Son   . Arthritis Maternal Grandmother        rheumatoid  . Heart attack Brother 89       smoker     Social History Ms. Filho reports that she has never smoked. She has never used smokeless tobacco. Ms. Barmes reports no history of alcohol use.   Review of Systems CONSTITUTIONAL: No weight loss, fever, chills, weakness or fatigue.  HEENT: Eyes: No visual loss, blurred vision, double vision or yellow sclerae.No hearing loss, sneezing, congestion, runny nose or sore throat.  SKIN: No rash or itching.  CARDIOVASCULAR: per hpi RESPIRATORY: No shortness of breath, cough or sputum.  GASTROINTESTINAL: No anorexia, nausea, vomiting or diarrhea. No abdominal pain or blood.  GENITOURINARY: No burning on urination, no polyuria NEUROLOGICAL: No headache, dizziness, syncope, paralysis, ataxia, numbness or tingling in the extremities. No change in bowel or bladder control.  MUSCULOSKELETAL: No muscle, back pain, joint pain or stiffness.  LYMPHATICS: No enlarged nodes. No history of splenectomy.  PSYCHIATRIC: No history of depression or anxiety.  ENDOCRINOLOGIC: No reports of sweating, cold or heat intolerance. No polyuria or polydipsia.  Marland Kitchen   Physical  Examination There were no vitals filed for this visit. Filed Weights   05/09/20 0808  Weight: 167 lb (75.8 kg)    Gen: resting comfortably, no acute distress HEENT: no scleral icterus, pupils equal round and reactive, no palptable cervical adenopathy,  CV: RRR, no mrg, no jvd Resp: Clear to auscultation bilaterally GI: abdomen is soft, non-tender, non-distended, normal bowel sounds, no hepatosplenomegaly MSK: extremities are warm, no edema.  Skin: warm, no rash Neuro:  no focal deficits Psych: appropriate affect   Diagnostic Studies 10/2017 nuclear stress  There was no ST segment deviation noted during stress.  The study is normal. There are no perfusion defects  This is a low risk study.  The left ventricular ejection fraction is hyperdynamic (>65%).   11/2017 Monitor  21 day event monitor  Min HR 53, Max HR 146, Avg HR 76  Symptoms correlate with sinus rhythm with PACs, at times runs of PACs up to 5 beats    Assessment and Plan  1. PSVT - noted by recent event monitor - start lopressor 12.5mg  bid - suspect related to her isolated episode of sycnope, perhaps sustained severe elevated rates at the time or perhaps a post conversion pause, fortunately no recurrent syncope      Antoine Poche, M.D.

## 2020-05-16 LAB — CBC AND DIFFERENTIAL
HCT: 35 — AB (ref 36–46)
Hemoglobin: 11.6 — AB (ref 12.0–16.0)
Neutrophils Absolute: 3.3
Platelets: 254 (ref 150–399)
WBC: 7.8

## 2020-05-16 LAB — COMPREHENSIVE METABOLIC PANEL
Albumin: 4.4 (ref 3.5–5.0)
Calcium: 8.8 (ref 8.7–10.7)
GFR calc Af Amer: 81
GFR calc non Af Amer: 71
Globulin: 1.9

## 2020-05-16 LAB — BASIC METABOLIC PANEL
BUN: 10 (ref 4–21)
CO2: 23 — AB (ref 13–22)
Chloride: 105 (ref 99–108)
Creatinine: 0.9 (ref 0.5–1.1)
Glucose: 116
Potassium: 4.2 (ref 3.4–5.3)
Sodium: 141 (ref 137–147)

## 2020-05-16 LAB — HEPATIC FUNCTION PANEL
ALT: 31 (ref 7–35)
AST: 25 (ref 13–35)
Alkaline Phosphatase: 67 (ref 25–125)
Bilirubin, Total: 0.3

## 2020-05-16 LAB — CBC: RBC: 4.14 (ref 3.87–5.11)

## 2020-05-28 ENCOUNTER — Ambulatory Visit (INDEPENDENT_AMBULATORY_CARE_PROVIDER_SITE_OTHER): Payer: 59 | Admitting: Internal Medicine

## 2020-05-28 ENCOUNTER — Other Ambulatory Visit: Payer: Self-pay

## 2020-05-28 ENCOUNTER — Encounter: Payer: Self-pay | Admitting: Internal Medicine

## 2020-05-28 VITALS — BP 130/82 | HR 79 | Ht 68.0 in | Wt 168.4 lb

## 2020-05-28 DIAGNOSIS — E119 Type 2 diabetes mellitus without complications: Secondary | ICD-10-CM

## 2020-05-28 DIAGNOSIS — E663 Overweight: Secondary | ICD-10-CM

## 2020-05-28 DIAGNOSIS — Z794 Long term (current) use of insulin: Secondary | ICD-10-CM

## 2020-05-28 DIAGNOSIS — E785 Hyperlipidemia, unspecified: Secondary | ICD-10-CM | POA: Diagnosis not present

## 2020-05-28 LAB — POCT GLYCOSYLATED HEMOGLOBIN (HGB A1C): Hemoglobin A1C: 6.2 % — AB (ref 4.0–5.6)

## 2020-05-28 NOTE — Patient Instructions (Addendum)
Please continue: - Metformin ER 500 mg 2x daily - Ozempic 0.5 mg weekly  Please move the Folic acid at night.  Please come back for a follow-up appointment in 6 months.   Please consider the following ways to cut down carbs and fat and increase fiber and micronutrients in your diet: - substitute whole grain for white bread or pasta - substitute brown rice for white rice - substitute 90-calorie flat bread pieces for slices of bread when possible - substitute sweet potatoes or yams for white potatoes - substitute humus for margarine - substitute tofu for cheese when possible - substitute almond or rice milk for regular milk (would not drink soy milk daily due to concern for soy estrogen influence on breast cancer risk) - substitute dark chocolate for other sweets when possible - substitute water - can add lemon or orange slices for taste - for diet sodas (artificial sweeteners will trick your body that you can eat sweets without getting calories and will lead you to overeating and weight gain in the long run) - do not skip breakfast or other meals (this will slow down the metabolism and will result in more weight gain over time)  - can try smoothies made from fruit and almond/rice milk in am instead of regular breakfast - can also try old-fashioned (not instant) oatmeal made with almond/rice milk in am - order the dressing on the side when eating salad at a restaurant (pour less than half of the dressing on the salad) - eat as little meat as possible - can try juicing, but should not forget that juicing will get rid of the fiber, so would alternate with eating raw veg./fruits or drinking smoothies - use as little oil as possible, even when using olive oil - can dress a salad with a mix of balsamic vinegar and lemon juice, for e.g. - use agave nectar, stevia sugar, or regular sugar rather than artificial sweateners - steam or broil/roast veggies  - snack on veggies/fruit/nuts (unsalted,  preferably) when possible, rather than processed foods - reduce or eliminate aspartame in diet (it is in diet sodas, chewing gum, etc) Read the labels!  Try to read Dr. Katherina Right book: "Program for Reversing Diabetes" for other ideas for healthy eating.

## 2020-05-28 NOTE — Progress Notes (Signed)
Patient ID: Raven [name], female   DOB: 1956/02/04, 64 y.o.   MRN: [id number]  This visit occurred during the SARS-CoV-2 public health emergency.  Safety protocols were in place, including screening questions prior to the visit, additional usage of staff PPE, and extensive cleaning of exam room while observing appropriate contact time as indicated for disinfecting solutions.   HPI: Raven [name] is a 63 y.o.-year-old female, returning for f/u for DM2, dx ~1995, insulin-dependent since 11/2013 - now insulin-independent, controlled, without long-term complications. Last visit 6 months ago.  She started Adderall 4-5 mo ago >> doing much better with remembering to take her medications.  Reviewed HbA1c levels: Lab Results  Component Value Date   HGBA1C 6.6 (A) 11/26/2019   HGBA1C 5.8 (A) 07/23/2019   HGBA1C 5.9 (A) 03/21/2019   She has RA >> was on Simponi (changed from Remicade) and MTX >> started Orencia and stays on MTX. On Plaquenil.  On prednisone 5 mg daily.  Pt is on a regimen of: - Metformin ER 1500 >> 500 mg 2x a day - Ozempic 0.5 mg weekly - started 12/2018 -she had initially nausea, now resolved Previously on Levemir 14 >> 10 >> 4-6 units at bedtime >> stopped 07/2019 She tried Glimepiride in the past >> fluctuating blood sugar. We stopped glipizide and Januvia only started Ozempic 12/2018.  She had a CGM in the past but could not afford it anymore.  She checks sugars 2x a day: - am: 90-114 >> 125 (off Ozempic) >> 101-114 >> 104-120 - 2h after b'fast: 160-180 >> 120-130 >> n/c >> 120-140 >> n/c - before lunch: n/c >> 110-120 >> n/c - 2h after lunch: n/c >> 140-154 >> n/c  - before dinner: 130-150 >> 150 >> n/c - 2h after dinner: 120-140 >> 160-180 (off Ozempic) >> 150 >> 134-163 - bedtime: 110-120 >> n/c   - nighttime: n/c >> 100-150 >> n/c Lowest sugar was 52 (x2 -Glipizide) >> 87 >> 90 >> 91 >> 104;  she has hypoglycemia awareness in the 70s. Highest sugar was 324  >> 240 >> 183 >> 199 >> 180 >> 163.  Meter: ReliOn.  Pt's meals are: - Breakfast: scrambled eggs + bacon + tomatoes - Lunch: meat + 2 veggies + no bread unless burger - Dinner: meat + 2 veggies - Snacks: 2: nuts; cheese;   -No CKD, last BUN/creatinine:  Lab Results  Component Value Date   BUN 10 05/16/2020   CREATININE 0.9 05/16/2020  On lisinopril 10. -+ HL; last set of lipids: Lab Results  Component Value Date   CHOL 180 03/21/2020   HDL 53.70 03/21/2020   LDLCALC 94 03/21/2020   LDLDIRECT 142.3 03/06/2008   TRIG 162.0 (H) 03/21/2020   CHOLHDL 3 03/21/2020  She was taken off statins in the past due to transaminitis in 10/2013.  Atorvastatin caused muscle cramps.  Currently on Crestor 10 and fish oil. - last eye exam 04/2019: No DR, had cataract>> had cataract sx's 05/2018. -Denies numbness and tingling in her feet.  Started on Omeprazole for choking >> resolved.  She had an esophageal dilation 03/2020.  ROS: Constitutional: no weight gain/no weight loss, no fatigue, no subjective hyperthermia, no subjective hypothermia Eyes: no blurry vision, no xerophthalmia ENT: no sore throat, no nodules palpated in neck, + improved dysphagia, no odynophagia, no hoarseness Cardiovascular: no CP/no SOB/no palpitations/no leg swelling Respiratory: no cough/no SOB/no wheezing Gastrointestinal: + N/no V/no D/no C/no acid reflux Musculoskeletal: no muscle aches/no joint aches Skin:  no rashes, no hair loss Neurological: + tremors/no numbness/no tingling/no dizziness  I reviewed pt's medications, allergies, PMH, social hx, family hx, and changes were documented in the history of present illness. Otherwise, unchanged from my initial visit note.  Past Medical History:  Diagnosis Date  . Acute medial meniscus tear of right knee   . Anemia of chronic disease    Mild, Hb stable consistently  . Anxiety and depression   . Chest pain 10/2017   +Cardiac CT.  Myocardial perfusion imaging  NORMAL, EF>65%  . Diabetes mellitus 1995   Managed by Dr. Elvera Lennox (Endo)  . GERD (gastroesophageal reflux disease) per pt watches diet and take tums as needed  . H/O hiatal hernia    slightly larger ("moderate" size) on CT angio done to r/o PE 10/2017.  Marland Kitchen Hyperlipidemia   . Hypertension   . Interstitial cystitis   . Lichen sclerosus of female genitalia   . Migraine   . Osteoarthritis, multiple sites   . Palpitations 10/2017   3 wk event monitor: symptoms correlate with sinus rhythm with PACs, at times runs of PACs up to 5 beats.  . Pseudogout of knee, left    colchicine helpful  . Psoriasis   . Seasonal allergies   . Seropositive rheumatoid arthritis (HCC)    Hands, knees, jaw, elbow: responding to Simponi as of 09/29/15 rheum f/u.  Waning effectiveness on 03/2016 f/u so pt switched to Orencia injections.  Doing well on chronic low-dose prednisone therapy+ methotrexate +orencia as of 01/2019, 08/2019, 11/2019    Past Surgical History:  Procedure Laterality Date  . ABDOMINAL HYSTERECTOMY  1991   for fibroids; ovaries still in  . BREAST BIOPSY  2001; 02/12/16   fibrocystic breast disease in 2001 and again in 02/2016 (+scar from prev bx site w/calcifications).  . CARDIOVASCULAR STRESS TEST  11/01/2017   Myocard perf imaging: NORMAL (EF >60-65%)  . COLONOSCOPY  09/12/2013; 02/25/20   Normal 2015 and 2021: Recall 5 yrs ([facility], Dr. Evette Cristal) due to FH of colon polyps.  Manfred Shirts   for chronic UTI  . DEXA  10/2016   Bone density normal (T-score 0.0)  . ESOPHAGOGASTRODUODENOSCOPY  04/19/2011   Nl except antral gastritis: bx = mild chron gastritis (H pyloria neg)   . ESOPHAGOGASTRODUODENOSCOPY (EGD) WITH PROPOFOL N/A 03/27/2020   Procedure: ESOPHAGOGASTRODUODENOSCOPY (EGD) WITH PROPOFOL ;  Surgeon: Graylin Shiver, MD;  Location: [facility];  Service: Endoscopy;  Laterality: N/A;  . EVENT MONITOR  10/2017   3 wk-->Symptoms correlate with sinus rhythm with PACs, at times runs of PACs up  to 5 beats.  Marland Kitchen GALLBLADDER SURGERY  2019  . intraarticular steroid injection  2013   3 R knee & L X 1; Dr Netta Corrigan  . KNEE ARTHROCENTESIS  2013   R knee x 3 & L X 1  . KNEE ARTHROSCOPY  10/21/2011   Procedure: ARTHROSCOPY KNEE;  Surgeon: Jacki Cones, MD;  Location: [facility];  Service: Orthopedics;  Laterality: Right;  WITH MEDIAL and lateral shaving of femoral chondyl  with microfracture technique of lateral and medial femoral chondyl suprapatellar synovectomy  . LAPAROSCOPIC CHOLECYSTECTOMY  01/2018  . SAVORY DILATION N/A 03/27/2020   Procedure: SAVORY DILATION;  Surgeon: Graylin Shiver, MD;  Location: [facility];  Service: Endoscopy;  Laterality: N/A;  . TOTAL KNEE ARTHROPLASTY  04/12/2012   Procedure: TOTAL KNEE ARTHROPLASTY;  Surgeon: Jacki Cones, MD;  Location: [facility];  Service: Orthopedics;  Laterality: Right;  Right Total Knee Arthroplasty  . TUBAL LIGATION  1981    Social History   Socioeconomic History  . Marital status: Married    Spouse name: Not on file  . Number of children: Not on file  . Years of education: Not on file  . Highest education level: Not on file  Occupational History  . Not on file  Tobacco Use  . Smoking status: Never Smoker  . Smokeless tobacco: Never Used  Vaping Use  . Vaping Use: Never used  Substance and Sexual Activity  . Alcohol use: No  . Drug use: No  . Sexual activity: Not on file  Other Topics Concern  . Not on file  Social History Narrative   Married, 3 children (2 in [place], 1 in [place]).  4 grandchildren.   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)   No tob/alc/drugs.   2 cups of coffee/day x 3 months   Regular exercise- no-due to arthritis.   Religion affecting care, "it allows stress management:"   Social Determinants of Health   Financial Resource Strain:   . Difficulty of Paying Living Expenses: Not on file  Food Insecurity:   . Worried About Programme researcher, broadcasting/film/video in the Last Year: Not on  file  . Ran Out of Food in the Last Year: Not on file  Transportation Needs:   . Lack of Transportation (Medical): Not on file  . Lack of Transportation (Non-Medical): Not on file  Physical Activity:   . Days of Exercise per Week: Not on file  . Minutes of Exercise per Session: Not on file  Stress:   . Feeling of Stress : Not on file  Social Connections:   . Frequency of Communication with Friends and Family: Not on file  . Frequency of Social Gatherings with Friends and Family: Not on file  . Attends Religious Services: Not on file  . Active Member of Clubs or Organizations: Not on file  . Attends Banker Meetings: Not on file  . Marital Status: Not on file  Intimate Partner Violence:   . Fear of Current or Ex-Partner: Not on file  . Emotionally Abused: Not on file  . Physically Abused: Not on file  . Sexually Abused: Not on file    Current Outpatient Medications on File Prior to Visit  Medication Sig Dispense Refill  . Abatacept (ORENCIA) 125 MG/ML SOSY Inject 125 mg into the skin every Monday.    Marland Kitchen amphetamine-dextroamphetamine (ADDERALL XR) 15 MG 24 hr capsule TAKE ONE CAPSULE BY MOUTH EVERY MORNING 30 capsule 0  . cetirizine (ZYRTEC) 10 MG tablet Take 10 mg by mouth daily.     . Colchicine (MITIGARE) 0.6 MG CAPS Take 1.2 mg by mouth daily.    . diazepam (VALIUM) 5 MG tablet TAKE ONE TABLET BY MOUTH EVERY TWELVE HOURS AS NEEDED FOR MIGRAINE HEADACHES 60 tablet 5  . folic acid (FOLVITE) 1 MG tablet Take 1 mg by mouth daily.    Marland Kitchen ibuprofen (ADVIL) 200 MG tablet Take 400 mg by mouth every 8 (eight) hours as needed (pain/headaches.).    Marland Kitchen lisinopril (ZESTRIL) 20 MG tablet Take 0.5 tablets (10 mg total) by mouth daily. 45 tablet 3  . metFORMIN (GLUCOPHAGE-XR) 500 MG 24 hr tablet TAKE ONE TABLET BY MOUTH TWICE DAILY WITH A MEAL 180 tablet 3  . Methotrexate Sodium (METHOTREXATE PO) Inject 1 mL as directed every Wednesday.     . metoprolol tartrate (LOPRESSOR) 25 MG  tablet  Take 0.5 tablets (12.5 mg total) by mouth 2 (two) times daily. 90 tablet 3  . omeprazole (PRILOSEC) 40 MG capsule TAKE ONE CAPSULE BY MOUTH EVERY DAY (Patient taking differently: Take 40 mg by mouth daily before breakfast. ) 30 capsule 3  . OZEMPIC, 0.25 OR 0.5 MG/DOSE, 2 MG/1.5ML SOPN Inject 0.5 mg into the skin once a week. 3 mL 3  . predniSONE (DELTASONE) 5 MG tablet Take 5 mg by mouth daily.    . primidone (MYSOLINE) 50 MG tablet Take 50-100 mg by mouth See admin instructions. Take 1 tablet (50 mg) by mouth in the morning & take 2 tablets (100 mg) by mouth in the evening.    . rosuvastatin (CRESTOR) 20 MG tablet Take 1 tablet (20 mg total) by mouth daily. 30 tablet 3  . sertraline (ZOLOFT) 100 MG tablet Take 1 tablet (100 mg total) by mouth daily. 90 tablet 3   No current facility-administered medications on file prior to visit.    Allergies  Allergen Reactions  . Steri-Strip Compound Benzoin [Benzoin Compound] Anaphylaxis    Blisters and itching  . Penicillins Rash  . Pravastatin     Myalgias  . Simvastatin     Myalgias  . Hydrocodone-Acetaminophen Nausea And Vomiting    Family History  Problem Relation Age of Onset  . Diabetes Mother   . Stroke Mother 69  . Osteoarthritis Mother   . Stroke Brother 62  . Heart disease Father        CABG  . Lung cancer Father   . Prostate cancer Father   . Pancreatic cancer Father   . Pancreatic cancer Paternal Grandfather   . Bipolar disorder Daughter   . Anxiety disorder Son   . Arthritis Maternal Grandmother        rheumatoid  . Heart attack Brother 64       smoker   PE: BP 130/82   Pulse 79   Ht 5\' 8"  (1.727 m)   Wt 168 lb 6.4 oz (76.4 kg)   SpO2 98%   BMI 25.61 kg/m  Body mass index is 25.61 kg/m.  Wt Readings from Last 3 Encounters:  05/28/20 168 lb 6.4 oz (76.4 kg)  05/09/20 167 lb (75.8 kg)  03/27/20 165 lb (74.8 kg)   Constitutional: overweight, in NAD Eyes: PERRLA, EOMI, no exophthalmos ENT: moist mucous  membranes, no thyromegaly, no cervical lymphadenopathy Cardiovascular: RRR, No MRG Respiratory: CTA B Gastrointestinal: abdomen soft, NT, ND, BS+ Musculoskeletal: no deformities, strength intact in all 4 Skin: moist, warm, no rashes Neurological: no tremor with outstretched hands, DTR normal in all 4  ASSESSMENT: 1. DM2, insulin-independent, controlled, without long term complications, but with Hgly -She was on the freestyle libre CGM in the past but cannot afford it anymore.  2. HL  3.  Overweight  PLAN:  1. Patient with longstanding, previously uncontrolled diabetes with improved control after addition of a GLP-1 receptor agonist to her half maximal dose of Metformin ER (due to diarrhea.  Her sugars improved enough so we could stop insulin.  At last visit, sugars were at goal but she was off Ozempic for 1.5 months and sugars were higher in the evening.  However, she was able to restart it after a PA was approved by her insurance.   HbA1c was slightly higher than, at 6.6%. -At today's visit her sugars are all at goal, without significant hyperglycemic excursions and also without loss.  She does report some nausea, which may be related to  her GERD, but I also noticed that she is taking her folic acid in the morning and we discussed about moving this at night since it can cause nausea if taken in the morning.  She does not feel that her nausea is related to Ozempic.  We will continue the current regimen for now. - I suggested to:  Patient Instructions  Please continue: - Metformin ER 500 mg 2x daily - Ozempic 0.5 mg weekly  Please come back for a follow-up appointment in 6 months.  - we checked her HbA1c: 6.2% (lower) - advised to check sugars at different times of the day - 1x a day, rotating check times - advised for yearly eye exams >> she is not UTD -advised to schedule this. - return to clinic in 6 months   2. HL -Reviewed latest lipid panel from 03/2020: LDL at goal,  triglycerides slightly high: Lab Results  Component Value Date   CHOL 180 03/21/2020   HDL 53.70 03/21/2020   LDLCALC 94 03/21/2020   LDLDIRECT 142.3 03/06/2008   TRIG 162.0 (H) 03/21/2020   CHOLHDL 3 03/21/2020  -Continues Crestor 10, fish oil without side effects  3.  Overweight -We will continue Ozempic which should also help with weight loss -After starting Ozempic, she lost more than 40 pounds.  Weight was stable at last visit. - weight at last visit: 166 pounds- now 168 lbs  - relatively stable  Carlus Pavlov, MD PhD [facility] Endocrinology

## 2020-06-23 ENCOUNTER — Ambulatory Visit (INDEPENDENT_AMBULATORY_CARE_PROVIDER_SITE_OTHER): Payer: 59 | Admitting: Family Medicine

## 2020-06-23 ENCOUNTER — Other Ambulatory Visit: Payer: Self-pay

## 2020-06-23 ENCOUNTER — Encounter: Payer: Self-pay | Admitting: Family Medicine

## 2020-06-23 VITALS — BP 117/76 | HR 70 | Temp 98.0°F | Resp 16 | Ht 68.0 in | Wt 170.4 lb

## 2020-06-23 DIAGNOSIS — I471 Supraventricular tachycardia: Secondary | ICD-10-CM | POA: Diagnosis not present

## 2020-06-23 DIAGNOSIS — F419 Anxiety disorder, unspecified: Secondary | ICD-10-CM

## 2020-06-23 DIAGNOSIS — I10 Essential (primary) hypertension: Secondary | ICD-10-CM

## 2020-06-23 DIAGNOSIS — F988 Other specified behavioral and emotional disorders with onset usually occurring in childhood and adolescence: Secondary | ICD-10-CM | POA: Diagnosis not present

## 2020-06-23 DIAGNOSIS — E7800 Pure hypercholesterolemia, unspecified: Secondary | ICD-10-CM | POA: Diagnosis not present

## 2020-06-23 DIAGNOSIS — F32A Depression, unspecified: Secondary | ICD-10-CM

## 2020-06-23 LAB — LIPID PANEL
Cholesterol: 153 mg/dL (ref 0–200)
HDL: 49.9 mg/dL (ref >39.00)
LDL Cholesterol: 80 mg/dL (ref 0–99)
NonHDL: 103.51
Total CHOL/HDL Ratio: 3
Triglycerides: 120 mg/dL (ref 0.0–149.0)
VLDL: 24 mg/dL (ref 0.0–40.0)

## 2020-06-23 NOTE — Progress Notes (Signed)
OFFICE VISIT  06/23/2020  CC:  Chief Complaint  Patient presents with  . Follow-up    RCI, pt is fasting   HPI:    Patient is a 64 y.o. Caucasian female who presents for 3 mo f/u adult ADD, GERD, HTN, chronic anx/dep. SHe has DM2, managed by Dr. Elvera Lennox in endo. Has psoriatic arth, managed by Dr. Nickola Major in rheum.  A/P as of last visit: "1) Mild concussion with brief LOC: resolving. No imaging indicated.   Signs/symptoms to call or return for were reviewed and pt expressed understanding.  2) Syncope-?cardiac->check mag,lytes,cbc.  EKG normal here todya, have her return to see Dr. Wyline Mood for consideration of long term rhythm monitoring.  3) ADD: stable.  I did electronic rx's for adderall xr 15mg , 1 qd, #30 for each of the next 3 mo today.  Appropriate fill on/after date was noted on each rx.  4) Recurrent MDD: stable.  No changes.  5) HTN: The current medical regimen is effective;  continue present plan and medications. Lytes/cr today.  6) HLD: tolerating statin.  FLP and hepatic panel today.  7) GERD: PPI qd long term, has esoph stricture and will be getting this dilated very soon."  INTERIM HX: Dx'd with PSVT since I last saw her, monitoring showing runs of PSVT up to 200 bpm, was started on metoprolol 12.5mg  bid. She does have about 1 episode per month->"takes my breath away", HA, fatigue, lightheaded---sits down and resolves in 30-60 min or so.  No palp's or racing heart sensation.  No presyncope or syncope. BP: home bp's consistently <130/80.  Taking 1/2 of 20 mg lisin tab qd.  HLD: LDL 94 last visit so I increased her rosuva to 20mg  qd and she's due for repeat lipids today. Hepatic panel and renal function about 2 mo after starting this dose were normal/stable.  ADD: Pt states all is going well with the med at current dosing: much improved focus, concentration, task completion.  Less frustration, better multitasking, less impulsivity and restlessness.  Mood is  stable. No side effects from the medication. Takes it daily, usually lasts 7 hrs. Mood and anxiety level stable, in fact a bit better than usual last few months, continues sertraline 100mg  qd long term   PMP AWARE reviewed today: most recent rx for adderall xr was filled 05/06/20, # 30, rx by me. No red flags.  ROS: no fevers, no CP, no SOB, no wheezing, no cough, no rashes, no melena/hematochezia.  No polyuria or polydipsia.  No myalgias or arthralgias.  No focal weakness, paresthesias, or tremors.  No acute vision or hearing abnormalities. No n/v/d or abd pain.  No palpitations.    Past Medical History:  Diagnosis Date  . Acute medial meniscus tear of right knee   . Anemia of chronic disease    Mild, Hb stable consistently  . Anxiety and depression   . Chest pain 10/2017   +Cardiac CT.  Myocardial perfusion imaging NORMAL, EF>65%  . Diabetes mellitus 1995   Managed by Dr. (Endo)  . GERD (gastroesophageal reflux disease) per pt watches diet and take tums as needed  . H/O hiatal hernia    slightly larger ("moderate" size) on CT angio done to r/o PE 10/2017.  11/2017 Hyperlipidemia   . Hypertension   . Interstitial cystitis   . Lichen sclerosus of female genitalia   . Migraine   . Osteoarthritis, multiple sites   . Palpitations 10/2017   3 wk event monitor: symptoms correlate with sinus  rhythm with PACs, at times runs of PACs up to 5 beats.  . Pseudogout of knee, left    colchicine helpful  . Psoriasis   . Seasonal allergies   . Seropositive rheumatoid arthritis (HCC)    Hands, knees, jaw, elbow: responding to Simponi as of 09/29/15 rheum f/u.  Waning effectiveness on 03/2016 f/u so pt switched to Orencia injections.  Doing well on chronic low-dose prednisone therapy+ methotrexate +orencia as of 01/2019, 08/2019, 11/2019    Past Surgical History:  Procedure Laterality Date  . ABDOMINAL HYSTERECTOMY  1991   for fibroids; ovaries still in  . BREAST BIOPSY  2001; 02/12/16    fibrocystic breast disease in 2001 and again in 02/2016 (+scar from prev bx site w/calcifications).  . CARDIOVASCULAR STRESS TEST  11/01/2017   Myocard perf imaging: NORMAL (EF >60-65%)  . COLONOSCOPY  09/12/2013; 02/25/20   Normal 2015 and 2021: Recall 5 yrs ([facility], Dr. Evette Cristal) due to FH of colon polyps.  Manfred Shirts   for chronic UTI  . DEXA  10/2016   Bone density normal (T-score 0.0)  . ESOPHAGOGASTRODUODENOSCOPY  04/19/2011   Nl except antral gastritis: bx = mild chron gastritis (H pyloria neg)   . ESOPHAGOGASTRODUODENOSCOPY (EGD) WITH PROPOFOL N/A 03/27/2020   Esoph stenosis-->dilated.  Mod size hiatal hernia ([facility]). Procedure: ESOPHAGOGASTRODUODENOSCOPY (EGD) WITH PROPOFOL ;  Surgeon: Graylin Shiver, MD;  Location: [facility];  Service: Endoscopy;  Laterality: N/A;  . EVENT MONITOR  10/2017   3 wk-->Symptoms correlate with sinus rhythm with PACs, at times runs of PACs up to 5 beats.  Marland Kitchen GALLBLADDER SURGERY  2019  . intraarticular steroid injection  2013   3 R knee & L X 1; Dr Netta Corrigan  . KNEE ARTHROCENTESIS  2013   R knee x 3 & L X 1  . KNEE ARTHROSCOPY  10/21/2011   Procedure: ARTHROSCOPY KNEE;  Surgeon: Jacki Cones, MD;  Location: [facility];  Service: Orthopedics;  Laterality: Right;  WITH MEDIAL and lateral shaving of femoral chondyl  with microfracture technique of lateral and medial femoral chondyl suprapatellar synovectomy  . LAPAROSCOPIC CHOLECYSTECTOMY  01/2018  . SAVORY DILATION N/A 03/27/2020   Procedure: SAVORY DILATION;  Surgeon: Graylin Shiver, MD;  Location: [facility];  Service: Endoscopy;  Laterality: N/A;  . TOTAL KNEE ARTHROPLASTY  04/12/2012   Procedure: TOTAL KNEE ARTHROPLASTY;  Surgeon: Jacki Cones, MD;  Location: [facility];  Service: Orthopedics;  Laterality: Right;  Right Total Knee Arthroplasty  . TUBAL LIGATION  1981    Outpatient Medications Prior to Visit  Medication Sig Dispense Refill  . Abatacept (ORENCIA) 125  MG/ML SOSY Inject 125 mg into the skin every Monday.    Marland Kitchen amphetamine-dextroamphetamine (ADDERALL XR) 15 MG 24 hr capsule TAKE ONE CAPSULE BY MOUTH EVERY MORNING 30 capsule 0  . cetirizine (ZYRTEC) 10 MG tablet Take 10 mg by mouth daily.    . Colchicine 0.6 MG CAPS Take 1.2 mg by mouth daily.    . diazepam (VALIUM) 5 MG tablet TAKE ONE TABLET BY MOUTH EVERY TWELVE HOURS AS NEEDED FOR MIGRAINE HEADACHES 60 tablet 5  . folic acid (FOLVITE) 1 MG tablet Take 1 mg by mouth daily.    Marland Kitchen ibuprofen (ADVIL) 200 MG tablet Take 400 mg by mouth every 8 (eight) hours as needed (pain/headaches.).    Marland Kitchen lisinopril (ZESTRIL) 20 MG tablet Take 0.5 tablets (10 mg total) by mouth daily. 45 tablet 3  .  metFORMIN (GLUCOPHAGE-XR) 500 MG 24 hr tablet TAKE ONE TABLET BY MOUTH TWICE DAILY WITH A MEAL 180 tablet 3  . Methotrexate Sodium (METHOTREXATE PO) Inject 1 mL as directed every Wednesday.     . metoprolol tartrate (LOPRESSOR) 25 MG tablet Take 0.5 tablets (12.5 mg total) by mouth 2 (two) times daily. 90 tablet 3  . omeprazole (PRILOSEC) 40 MG capsule TAKE ONE CAPSULE BY MOUTH EVERY DAY (Patient taking differently: Take 40 mg by mouth daily before breakfast.) 30 capsule 3  . OZEMPIC, 0.25 OR 0.5 MG/DOSE, 2 MG/1.5ML SOPN Inject 0.5 mg into the skin once a week. 3 mL 3  . predniSONE (DELTASONE) 5 MG tablet Take 5 mg by mouth daily.    . primidone (MYSOLINE) 50 MG tablet Take 50-100 mg by mouth See admin instructions. Take 1 tablet (50 mg) by mouth in the morning & take 2 tablets (100 mg) by mouth in the evening.    . rosuvastatin (CRESTOR) 20 MG tablet Take 1 tablet (20 mg total) by mouth daily. 30 tablet 3  . sertraline (ZOLOFT) 100 MG tablet Take 1 tablet (100 mg total) by mouth daily. 90 tablet 3   No facility-administered medications prior to visit.    Allergies  Allergen Reactions  . Steri-Strip Compound Benzoin [Benzoin Compound] Anaphylaxis    Blisters and itching  . Penicillins Rash  . Pravastatin      Myalgias  . Simvastatin     Myalgias  . Hydrocodone-Acetaminophen Nausea And Vomiting    ROS As per HPI  PE: Vitals with BMI 06/23/2020 05/28/2020 05/09/2020  Height 5\' 8"  5\' 8"  5' 8.5"  Weight 170 lbs 6 oz 168 lbs 6 oz 167 lbs  BMI 25.92 25.61 25.02  Systolic 117 130  Diastolic 76 82 72  Pulse 70 79 76     Wt Readings from Last 2 Encounters:  06/23/20 170 lb 6.4 oz (77.3 kg)  05/28/20 168 lb 6.4 oz (76.4 kg)    Gen: alert, oriented x 4, affect pleasant.  Lucid thinking and conversation noted. HEENT: PERRLA, EOMI.   Neck: no LAD, mass, or thyromegaly. CV: RRR, no m/r/g LUNGS: CTA bilat, nonlabored. NEURO: no tremor or tics noted on observation.  Coordination intact. CN 2-12 grossly intact bilaterally, strength 5/5 in all extremeties.  No ataxia.    LABS:  Lab Results  Component Value Date   TSH 1.37 11/09/2019   Lab Results  Component Value Date   WBC 7.8 05/16/2020   HGB 11.6 (A) 05/16/2020   HCT 35 (A) 05/16/2020   MCV 88.7 03/21/2020   PLT 254 05/16/2020  No results found for: IRON, TIBC, FERRITIN No results found for: VITAMINB12  Lab Results  Component Value Date   CREATININE 0.9 05/16/2020   BUN 10 05/16/2020   NA 141 05/16/2020   K 4.2 05/16/2020   CL 105 05/16/2020   CO2 23 (A) 05/16/2020   Lab Results  Component Value Date   ALT 31 05/16/2020   AST 25 05/16/2020   ALKPHOS 67 05/16/2020   BILITOT 0.4 03/21/2020   Lab Results  Component Value Date   CHOL 180 03/21/2020   Lab Results  Component Value Date   HDL 53.70 03/21/2020   Lab Results  Component Value Date   LDLCALC 94 03/21/2020   Lab Results  Component Value Date   TRIG 162.0 (H) 03/21/2020   Lab Results  Component Value Date   CHOLHDL 3 03/21/2020   Lab Results  Component Value Date  HGBA1C 6.2 (A) 05/28/2020    IMPRESSION AND PLAN:  1) HLD: tolerating rosuva 20mg  qd, hepatic panel normal 2 mo after starting this dose. FLP today.  2) Adult ADD: doing  well on adderall xr 15mg  qd. No new rx for this med needed at this time. CSC and UDS UTD.  3) Anx/dep: stable.  Cont sertraline 100 mg qd.  4) HTN: stable on lisin 10mg  qd and metop 12.5 bid. Lytes/cr stable/normal 5 wks ago.  5) PSVT: doing well on 12.5mg  lopressor bid. Has f/u with cardiologist soon.   No changes today.  An After Visit Summary was printed and given to the patient.  FOLLOW UP: Return in about 3 months (around 09/21/2020) for routine chronic illness f/u.  Signed:  Santiago Bumpers, MD           06/23/2020

## 2020-07-14 ENCOUNTER — Other Ambulatory Visit: Payer: Self-pay | Admitting: Family Medicine

## 2020-07-23 ENCOUNTER — Telehealth: Payer: Self-pay | Admitting: Family Medicine

## 2020-07-23 NOTE — Telephone Encounter (Signed)
Pt would like advice on home care

## 2020-07-23 NOTE — Telephone Encounter (Signed)
Advised husband per [facility]

## 2020-07-23 NOTE — Telephone Encounter (Signed)
Patient states she has Covid, has not been tested but her husband tested positive 07/16/20 and she has same same symptoms of cough, fever, sinus congestion, sore throat, hoarseness, fatigue, muscle aches. Also gave patient phone number for [facility] Covid. Patient just wants it noted in her record that she has it and would like some advice on home care.

## 2020-07-23 NOTE — Telephone Encounter (Signed)
Mucinex dm 12hr otc and tylenol 1000 mg q6h prn aches/fever. 65 oz clear fluids per day

## 2020-07-24 ENCOUNTER — Other Ambulatory Visit: Payer: 59

## 2020-07-24 ENCOUNTER — Other Ambulatory Visit: Payer: Self-pay

## 2020-07-24 DIAGNOSIS — Z20822 Contact with and (suspected) exposure to covid-19: Secondary | ICD-10-CM

## 2020-07-27 LAB — NOVEL CORONAVIRUS, NAA: SARS-CoV-2, NAA: DETECTED — AB

## 2020-07-28 ENCOUNTER — Telehealth: Payer: Self-pay

## 2020-07-28 NOTE — Telephone Encounter (Signed)
Called to discuss with patient about COVID-19 symptoms and the use of one of the available treatments for those with mild to moderate Covid symptoms and at a high risk of hospitalization.  Pt appears to qualify for outpatient treatment due to co-morbid conditions and/or a member of an at-risk group in accordance with the FDA Emergency Use Authorization.    Symptom onset: 07/17/20 Vaccinated: Yes Booster? Yes Immunocompromised? Yes Qualifiers: RA  Unable to reach pt - Reached pt. Out of window for. States she is "feeling much better."   Raven [name]

## 2020-07-31 ENCOUNTER — Other Ambulatory Visit: Payer: Self-pay

## 2020-08-01 ENCOUNTER — Encounter: Payer: Self-pay | Admitting: Family Medicine

## 2020-08-01 ENCOUNTER — Ambulatory Visit (INDEPENDENT_AMBULATORY_CARE_PROVIDER_SITE_OTHER): Payer: 59 | Admitting: Family Medicine

## 2020-08-01 DIAGNOSIS — B349 Viral infection, unspecified: Secondary | ICD-10-CM | POA: Diagnosis not present

## 2020-08-01 DIAGNOSIS — H1033 Unspecified acute conjunctivitis, bilateral: Secondary | ICD-10-CM

## 2020-08-01 MED ORDER — POLYMYXIN B-TRIMETHOPRIM 10000-0.1 UNIT/ML-% OP SOLN: 1.0000 [drp] | OPHTHALMIC | 0 refills | Status: AC

## 2020-08-01 NOTE — Progress Notes (Signed)
OFFICE VISIT  08/01/2020  CC:  Chief Complaint  Patient presents with   Pink eye    Both eyes. Started having issues 1 week ago    HPI:    Patient is a 65 y.o. Caucasian female who presents for "pink eye". Onset about 1 week ago, eyes burning, some redness, a little goopy.  OTC allergy drops no help, moisturizing drops no help.  No vision abnormality.  Eyes not swelling.  She had covid infection about 2 wks ago.  Sx's started 1/6.   Still with cough, HA.  No fever.  Some alteration in sense of taste/smell. Eating and drinking ok.   Past Medical History:  Diagnosis Date   Acute medial meniscus tear of right knee    Anemia of chronic disease    Mild, Hb stable consistently   Anxiety and depression    Chest pain 10/2017   +Cardiac CT.  Myocardial perfusion imaging NORMAL, EF>65%   Diabetes mellitus 1995   Managed by Dr. Elvera Lennox (Endo)   GERD (gastroesophageal reflux disease) per pt watches diet and take tums as needed   H/O hiatal hernia    slightly larger ("moderate" size) on CT angio done to r/o PE 10/2017.   Hyperlipidemia    Hypertension    Interstitial cystitis    Lichen sclerosus of female genitalia    Migraine    Osteoarthritis, multiple sites    Palpitations 10/2017   3 wk event monitor: symptoms correlate with sinus rhythm with PACs, at times runs of PACs up to 5 beats.   Pseudogout of knee, left    colchicine helpful   Psoriasis    Seasonal allergies    Seropositive rheumatoid arthritis (HCC)    Hands, knees, jaw, elbow: responding to Simponi as of 09/29/15 rheum f/u.  Waning effectiveness on 03/2016 f/u so pt switched to Orencia injections.  Doing well on chronic low-dose prednisone therapy+ methotrexate +orencia as of 01/2019, 08/2019, 11/2019    Past Surgical History:  Procedure Laterality Date   ABDOMINAL HYSTERECTOMY  1991   for fibroids; ovaries still in   BREAST BIOPSY  2001; 02/12/16   fibrocystic breast disease in 2001 and again in  02/2016 (+scar from prev bx site w/calcifications).   CARDIOVASCULAR STRESS TEST  11/01/2017   Myocard perf imaging: NORMAL (EF >60-65%)   COLONOSCOPY  09/12/2013; 02/25/20   Normal 2015 and 2021: Recall 5 yrs ([facility], Dr. Evette Cristal) due to FH of colon polyps.   CYSTOSCOPY  2004   for chronic UTI   DEXA  10/2016   Bone density normal (T-score 0.0)   ESOPHAGOGASTRODUODENOSCOPY  04/19/2011   Nl except antral gastritis: bx = mild chron gastritis (H pyloria neg)    ESOPHAGOGASTRODUODENOSCOPY (EGD) WITH PROPOFOL N/A 03/27/2020   Esoph stenosis-->dilated.  Mod size hiatal hernia ([facility]). Procedure: ESOPHAGOGASTRODUODENOSCOPY (EGD) WITH PROPOFOL ;  Surgeon: Graylin Shiver, MD;  Location: [facility];  Service: Endoscopy;  Laterality: N/A;   EVENT MONITOR  10/2017   3 wk-->Symptoms correlate with sinus rhythm with PACs, at times runs of PACs up to 5 beats.   GALLBLADDER SURGERY  2019   intraarticular steroid injection  2013   3 R knee & L X 1; Dr Netta Corrigan   KNEE ARTHROCENTESIS  2013   R knee x 3 & L X 1   KNEE ARTHROSCOPY  10/21/2011   Procedure: ARTHROSCOPY KNEE;  Surgeon: Jacki Cones, MD;  Location: [facility];  Service: Orthopedics;  Laterality: Right;  WITH MEDIAL and lateral shaving of femoral chondyl  with microfracture technique of lateral and medial femoral chondyl suprapatellar synovectomy   LAPAROSCOPIC CHOLECYSTECTOMY  01/2018   SAVORY DILATION N/A 03/27/2020   Procedure: SAVORY DILATION;  Surgeon: Graylin Shiver, MD;  Location: [facility];  Service: Endoscopy;  Laterality: N/A;   TOTAL KNEE ARTHROPLASTY  04/12/2012   Procedure: TOTAL KNEE ARTHROPLASTY;  Surgeon: Jacki Cones, MD;  Location: [facility];  Service: Orthopedics;  Laterality: Right;  Right Total Knee Arthroplasty   TUBAL LIGATION  1981    Outpatient Medications Prior to Visit  Medication Sig Dispense Refill   Abatacept (ORENCIA) 125 MG/ML SOSY Inject 125 mg into the skin every  Monday.     amphetamine-dextroamphetamine (ADDERALL XR) 15 MG 24 hr capsule TAKE ONE CAPSULE BY MOUTH EVERY MORNING 30 capsule 0   cetirizine (ZYRTEC) 10 MG tablet Take 10 mg by mouth daily.     Colchicine 0.6 MG CAPS Take 1.2 mg by mouth daily.     diazepam (VALIUM) 5 MG tablet TAKE ONE TABLET BY MOUTH EVERY TWELVE HOURS AS NEEDED FOR MIGRAINE HEADACHES 60 tablet 5   folic acid (FOLVITE) 1 MG tablet Take 1 mg by mouth daily.     ibuprofen (ADVIL) 200 MG tablet Take 400 mg by mouth every 8 (eight) hours as needed (pain/headaches.).     lisinopril (ZESTRIL) 20 MG tablet Take 0.5 tablets (10 mg total) by mouth daily. 45 tablet 3   metFORMIN (GLUCOPHAGE-XR) 500 MG 24 hr tablet TAKE ONE TABLET BY MOUTH TWICE DAILY WITH A MEAL 180 tablet 3   methotrexate 50 MG/2ML injection Inject 25 mg into the muscle once a week.     metoprolol tartrate (LOPRESSOR) 25 MG tablet Take 0.5 tablets (12.5 mg total) by mouth 2 (two) times daily. 90 tablet 3   omeprazole (PRILOSEC) 40 MG capsule TAKE ONE CAPSULE BY MOUTH EVERY DAY 30 capsule 3   OZEMPIC, 0.25 OR 0.5 MG/DOSE, 2 MG/1.5ML SOPN Inject 0.5 mg into the skin once a week. 3 mL 3   predniSONE (DELTASONE) 5 MG tablet Take 5 mg by mouth daily.     primidone (MYSOLINE) 50 MG tablet Take 50-100 mg by mouth See admin instructions. Take 1 tablet (50 mg) by mouth in the morning & take 2 tablets (100 mg) by mouth in the evening.     rosuvastatin (CRESTOR) 20 MG tablet Take 1 tablet (20 mg total) by mouth daily. 30 tablet 3   sertraline (ZOLOFT) 100 MG tablet Take 1 tablet (100 mg total) by mouth daily. 90 tablet 3   Methotrexate Sodium (METHOTREXATE PO) Inject 1 mL as directed every Wednesday.      No facility-administered medications prior to visit.    Allergies  Allergen Reactions   Steri-Strip Compound Benzoin [Benzoin Compound] Anaphylaxis    Blisters and itching   Penicillins Rash   Other     Other reaction(s): stomach upset   Pravastatin      Myalgias   Simvastatin     Myalgias   Hydrocodone-Acetaminophen Nausea And Vomiting    ROS As per HPI  PE: Vitals with BMI 06/23/2020 05/28/2020 05/09/2020  Height 5\' 8"  5\' 8"  5' 8.5"  Weight 170 lbs 6 oz 168 lbs 6 oz 167 lbs  BMI 25.92 25.61 25.02  Systolic 117 130  Diastolic 76 82 72  Pulse 70 79 76     Gen: Alert, well appearing.  Patient is oriented to person, place, time, and situation.  AFFECT: pleasant, lucid thought and speech. GMW:NUUV: there is very mild diffuse bulbar >palpebral conjunctival injection.  No icteris, no lid swelling, no exudate.  EOMI, PERRLA.  Eyelashes normal, no stye or hordeolum.   Mouth: lips without lesion/swelling.  Oral mucosa pink and moist. Oropharynx without erythema, exudate, or swelling.  CV: RRR, no m/r/g.   LUNGS: CTA bilat, nonlabored resps, good aeration in all lung fields. EXT: no clubbing or cyanosis.  no edema.    LABS:    Chemistry      Component Value Date/Time   NA 141 05/16/2020 0000   K 4.2 05/16/2020 0000   CL 105 05/16/2020 0000   CO2 23 (A) 05/16/2020 0000   BUN 10 05/16/2020 0000   CREATININE 0.9 05/16/2020 0000   CREATININE 0.77 03/21/2020 0940   CREATININE 0.82 10/14/2017 1524   GLU 116 05/16/2020 0000      Component Value Date/Time   CALCIUM 8.8 05/16/2020 0000   ALKPHOS 67 05/16/2020 0000   AST 25 05/16/2020 0000   ALT 31 05/16/2020 0000   BILITOT 0.4 03/21/2020 0940     Lab Results  Component Value Date   HGBA1C 6.2 (A) 05/28/2020   IMPRESSION AND PLAN:  Acute conjunctivitis: 7d and not improving->will do trial of polytrim gtts->rx'd today. This is taking place in the context of pretty recent covid resp illness (still with lingering sx's) so this conceivably could be part of her viral syndrome.  Low suspicion that this is anything like uveitis or iritis assoc with her RA.  An After Visit Summary was printed and given to the patient.  FOLLOW UP: No follow-ups on file. Has 09/22/20 [facility]  appt  Signed:  Santiago Bumpers, MD           08/01/2020

## 2020-08-09 ENCOUNTER — Other Ambulatory Visit: Payer: Self-pay | Admitting: Family Medicine

## 2020-08-13 ENCOUNTER — Telehealth (INDEPENDENT_AMBULATORY_CARE_PROVIDER_SITE_OTHER): Payer: 59 | Admitting: Family Medicine

## 2020-08-13 ENCOUNTER — Other Ambulatory Visit: Payer: Self-pay

## 2020-08-13 ENCOUNTER — Encounter: Payer: Self-pay | Admitting: Family Medicine

## 2020-08-13 VITALS — BP 155/75 | HR 77

## 2020-08-13 DIAGNOSIS — J029 Acute pharyngitis, unspecified: Secondary | ICD-10-CM | POA: Diagnosis not present

## 2020-08-13 DIAGNOSIS — D84821 Immunodeficiency due to drugs: Secondary | ICD-10-CM | POA: Diagnosis not present

## 2020-08-13 DIAGNOSIS — Z79899 Other long term (current) drug therapy: Secondary | ICD-10-CM | POA: Diagnosis not present

## 2020-08-13 MED ORDER — CLINDAMYCIN HCL 300 MG PO CAPS: 300.0000 mg | ORAL_CAPSULE | Freq: Three times a day (TID) | ORAL | 0 refills | Status: DC

## 2020-08-13 NOTE — Progress Notes (Signed)
Virtual Visit via Video Note  I connected with pt on 08/13/20 at  4:00 PM EST by a video enabled telemedicine application and verified that I am speaking with the correct person using two identifiers.  Location patient: home, [place] Location provider:work or home office Persons participating in the virtual visit: patient, provider  I discussed the limitations of evaluation and management by telemedicine and the availability of in person appointments. The patient expressed understanding and agreed to proceed.  Telemedicine visit is a necessity given the COVID-19 restrictions in place at the current time.  HPI: 65 y/o WF being seen today for respiratory symptoms. Onset of ST yesterday, says HA as well, says white spots around uvula and around tonsils, and throat is red.  Doesn't feel any glands swelling in neck, no anterior neck pain or neck stiffness. No runny nose, stuffy nose, or cough.  No sob.  No n/v/d.  Pt reports hx of recurrent strep infections.  Pt taking orencia and methotrexate for treatment of seronegative RA. Is on prednisone dose pack b/c flare of RA b/c had to be off her methotrexate and orencia for a few weeks.  ROS: See pertinent positives and negatives per HPI.  Past Medical History:  Diagnosis Date  . Acute medial meniscus tear of right knee   . Anemia of chronic disease    Mild, Hb stable consistently  . Anxiety and depression   . Chest pain 10/2017   +Cardiac CT.  Myocardial perfusion imaging NORMAL, EF>65%  . Diabetes mellitus 1995   Managed by Dr. Elvera Lennox (Endo)  . GERD (gastroesophageal reflux disease) per pt watches diet and take tums as needed  . H/O hiatal hernia    slightly larger ("moderate" size) on CT angio done to r/o PE 10/2017.  Marland Kitchen Hyperlipidemia   . Hypertension   . Interstitial cystitis   . Lichen sclerosus of female genitalia   . Migraine   . Osteoarthritis, multiple sites   . Palpitations 10/2017   3 wk event monitor: symptoms correlate with  sinus rhythm with PACs, at times runs of PACs up to 5 beats.  . Pseudogout of knee, left    colchicine helpful  . Psoriasis   . Seasonal allergies   . Seropositive rheumatoid arthritis (HCC)    Hands, knees, jaw, elbow: responding to Simponi as of 09/29/15 rheum f/u.  Waning effectiveness on 03/2016 f/u so pt switched to Orencia injections.  Doing well on chronic low-dose prednisone therapy+ methotrexate +orencia as of 01/2019, 08/2019, 11/2019    Past Surgical History:  Procedure Laterality Date  . ABDOMINAL HYSTERECTOMY  1991   for fibroids; ovaries still in  . BREAST BIOPSY  2001; 02/12/16   fibrocystic breast disease in 2001 and again in 02/2016 (+scar from prev bx site w/calcifications).  . CARDIOVASCULAR STRESS TEST  11/01/2017   Myocard perf imaging: NORMAL (EF >60-65%)  . COLONOSCOPY  09/12/2013; 02/25/20   Normal 2015 and 2021: Recall 5 yrs ([facility], Dr. Evette Cristal) due to FH of colon polyps.  Manfred Shirts   for chronic UTI  . DEXA  10/2016   Bone density normal (T-score 0.0)  . ESOPHAGOGASTRODUODENOSCOPY  04/19/2011   Nl except antral gastritis: bx = mild chron gastritis (H pyloria neg)   . ESOPHAGOGASTRODUODENOSCOPY (EGD) WITH PROPOFOL N/A 03/27/2020   Esoph stenosis-->dilated.  Mod size hiatal hernia ([facility]). Procedure: ESOPHAGOGASTRODUODENOSCOPY (EGD) WITH PROPOFOL ;  Surgeon: Graylin Shiver, MD;  Location: [facility];  Service: Endoscopy;  Laterality: N/A;  .  EVENT MONITOR  10/2017   3 wk-->Symptoms correlate with sinus rhythm with PACs, at times runs of PACs up to 5 beats.  Marland Kitchen GALLBLADDER SURGERY  2019  . intraarticular steroid injection  2013   3 R knee & L X 1; Dr Netta Corrigan  . KNEE ARTHROCENTESIS  2013   R knee x 3 & L X 1  . KNEE ARTHROSCOPY  10/21/2011   Procedure: ARTHROSCOPY KNEE;  Surgeon: Jacki Cones, MD;  Location: [facility];  Service: Orthopedics;  Laterality: Right;  WITH MEDIAL and lateral shaving of femoral chondyl  with microfracture  technique of lateral and medial femoral chondyl suprapatellar synovectomy  . LAPAROSCOPIC CHOLECYSTECTOMY  01/2018  . SAVORY DILATION N/A 03/27/2020   Procedure: SAVORY DILATION;  Surgeon: Graylin Shiver, MD;  Location: [facility];  Service: Endoscopy;  Laterality: N/A;  . TOTAL KNEE ARTHROPLASTY  04/12/2012   Procedure: TOTAL KNEE ARTHROPLASTY;  Surgeon: Jacki Cones, MD;  Location: [facility];  Service: Orthopedics;  Laterality: Right;  Right Total Knee Arthroplasty  . TUBAL LIGATION  1981     Current Outpatient Medications:  .  Abatacept (ORENCIA) 125 MG/ML SOSY, Inject 125 mg into the skin every Monday., Disp: , Rfl:  .  amphetamine-dextroamphetamine (ADDERALL XR) 15 MG 24 hr capsule, TAKE ONE CAPSULE BY MOUTH EVERY MORNING, Disp: 30 capsule, Rfl: 0 .  cetirizine (ZYRTEC) 10 MG tablet, Take 10 mg by mouth daily., Disp: , Rfl:  .  Colchicine 0.6 MG CAPS, Take 1.2 mg by mouth daily., Disp: , Rfl:  .  diazepam (VALIUM) 5 MG tablet, TAKE ONE TABLET BY MOUTH EVERY TWELVE HOURS AS NEEDED FOR MIGRAINE HEADACHES, Disp: 60 tablet, Rfl: 5 .  folic acid (FOLVITE) 1 MG tablet, Take 1 mg by mouth daily., Disp: , Rfl:  .  ibuprofen (ADVIL) 200 MG tablet, Take 400 mg by mouth every 8 (eight) hours as needed (pain/headaches.)., Disp: , Rfl:  .  lisinopril (ZESTRIL) 20 MG tablet, Take 0.5 tablets (10 mg total) by mouth daily., Disp: 45 tablet, Rfl: 3 .  metFORMIN (GLUCOPHAGE-XR) 500 MG 24 hr tablet, TAKE ONE TABLET BY MOUTH TWICE DAILY WITH A MEAL, Disp: 180 tablet, Rfl: 3 .  methotrexate 50 MG/2ML injection, Inject 25 mg into the muscle once a week., Disp: , Rfl:  .  metoprolol tartrate (LOPRESSOR) 25 MG tablet, Take 0.5 tablets (12.5 mg total) by mouth 2 (two) times daily., Disp: 90 tablet, Rfl: 3 .  omeprazole (PRILOSEC) 40 MG capsule, TAKE ONE CAPSULE BY MOUTH EVERY DAY, Disp: 30 capsule, Rfl: 3 .  OZEMPIC, 0.25 OR 0.5 MG/DOSE, 2 MG/1.5ML SOPN, Inject 0.5 mg into the skin once a week., Disp: 3 mL, Rfl:  3 .  predniSONE (DELTASONE) 5 MG tablet, Take 5 mg by mouth daily., Disp: , Rfl:  .  primidone (MYSOLINE) 50 MG tablet, Take 50-100 mg by mouth See admin instructions. Take 1 tablet (50 mg) by mouth in the morning & take 2 tablets (100 mg) by mouth in the evening., Disp: , Rfl:  .  rosuvastatin (CRESTOR) 20 MG tablet, TAKE ONE TABLET BY MOUTH EVERY DAY, Disp: 90 tablet, Rfl: 1 .  sertraline (ZOLOFT) 100 MG tablet, Take 1 tablet (100 mg total) by mouth daily., Disp: 90 tablet, Rfl: 3  EXAM:  VITALS per patient if applicable:  Vitals with BMI 08/13/2020 06/23/2020 05/28/2020  Height - 5\' 8"  5\' 8"   Weight - 170 lbs 6 oz 168 lbs 6 oz  BMI -  25.92 25.61  Systolic 155 117 517  Diastolic 75 76 82  Pulse 77 70 79     GENERAL: alert, oriented, appears well and in no acute distress  HEENT: atraumatic, conjunttiva clear, no obvious abnormalities on inspection of external nose and ears  NECK: normal movements of the head and neck  LUNGS: on inspection no signs of respiratory distress, breathing rate appears normal, no obvious gross SOB, gasping or wheezing  CV: no obvious cyanosis  MS: moves all visible extremities without noticeable abnormality  PSYCH/NEURO: pleasant and cooperative, no obvious depression or anxiety, speech and thought processing grossly intact  LABS: none today    Chemistry      Component Value Date/Time   NA 141 05/16/2020 0000   K 4.2 05/16/2020 0000   CL 105 05/16/2020 0000   CO2 23 (A) 05/16/2020 0000   BUN 10 05/16/2020 0000   CREATININE 0.9 05/16/2020 0000   CREATININE 0.77 03/21/2020 0940   CREATININE 0.82 10/14/2017 1524   GLU 116 05/16/2020 0000      Component Value Date/Time   CALCIUM 8.8 05/16/2020 0000   ALKPHOS 67 05/16/2020 0000   AST 25 05/16/2020 0000   ALT 31 05/16/2020 0000   BILITOT 0.4 03/21/2020 0940     ASSESSMENT AND PLAN:  Discussed the following assessment and plan:  Acute pharyngitis, immunosuppression due to drug therapy for  RA. Penicillin allergic ("rash like measles").   Clindamycin 300 mg tid x 10d. Symptomatic care with tylenol.   -we discussed possible serious and likely etiologies, options for evaluation and workup, limitations of telemedicine visit vs in person visit, treatment, treatment risks and precautions. Pt prefers to treat via telemedicine empirically rather than in person at this moment.    I discussed the assessment and treatment plan with the patient. The patient was provided an opportunity to ask questions and all were answered. The patient agreed with the plan and demonstrated an understanding of the instructions.   F/u: if not improving signif if 3-4d  Signed:  Santiago Bumpers, MD           08/13/2020

## 2020-09-15 ENCOUNTER — Ambulatory Visit (INDEPENDENT_AMBULATORY_CARE_PROVIDER_SITE_OTHER): Payer: 59 | Admitting: Cardiology

## 2020-09-15 ENCOUNTER — Encounter: Payer: Self-pay | Admitting: Cardiology

## 2020-09-15 ENCOUNTER — Other Ambulatory Visit: Payer: Self-pay

## 2020-09-15 VITALS — BP 110/74 | HR 88 | Ht 68.5 in | Wt 166.6 lb

## 2020-09-15 DIAGNOSIS — E782 Mixed hyperlipidemia: Secondary | ICD-10-CM | POA: Diagnosis not present

## 2020-09-15 DIAGNOSIS — I471 Supraventricular tachycardia: Secondary | ICD-10-CM

## 2020-09-15 NOTE — Patient Instructions (Signed)

## 2020-09-15 NOTE — Progress Notes (Signed)
Clinical Summary Ms. Mangels is a 65 y.o.female seen today for follow up of the following medical problems.   1. Syncope  - prior episode of syncope at time of initial consultation. No recurrence   - event monitor showed runs of SVT up to 200bpm  - no recent palpitaiotns, no recurrent syncope  2. COVID + -diagnosed Jan 2022   3. Hyperlipdiema - 06/2020 TC 152 TG 120 HDL 50 LDL 80 \- compliant with statin  SH: works as Theme park manager   Past Medical History:  Diagnosis Date  . Acute medial meniscus tear of right knee   . Anemia of chronic disease    Mild, Hb stable consistently  . Anxiety and depression   . Chest pain 10/2017   +Cardiac CT.  Myocardial perfusion imaging NORMAL, EF>65%  . Diabetes mellitus 1995   Managed by Dr. Elvera Lennox (Endo)  . GERD (gastroesophageal reflux disease) per pt watches diet and take tums as needed  . H/O hiatal hernia    slightly larger ("moderate" size) on CT angio done to r/o PE 10/2017.  Marland Kitchen Hyperlipidemia   . Hypertension   . Interstitial cystitis   . Lichen sclerosus of female genitalia   . Migraine   . Osteoarthritis, multiple sites   . Palpitations 10/2017   3 wk event monitor: symptoms correlate with sinus rhythm with PACs, at times runs of PACs up to 5 beats.  . Pseudogout of knee, left    colchicine helpful  . Psoriasis   . Seasonal allergies   . Seropositive rheumatoid arthritis (HCC)    Hands, knees, jaw, elbow: responding to Simponi as of 09/29/15 rheum f/u.  Waning effectiveness on 03/2016 f/u so pt switched to Orencia injections.  Doing well on chronic low-dose prednisone therapy+ methotrexate +orencia as of 01/2019, 08/2019, 11/2019     Allergies  Allergen Reactions  . Steri-Strip Compound Benzoin [Benzoin Compound] Anaphylaxis    Blisters and itching  . Penicillins Rash  . Other     Other reaction(s): stomach upset  . Pravastatin     Myalgias  . Simvastatin     Myalgias  . Hydrocodone-Acetaminophen Nausea  And Vomiting     Current Outpatient Medications  Medication Sig Dispense Refill  . Abatacept (ORENCIA) 125 MG/ML SOSY Inject 125 mg into the skin every Monday.    Marland Kitchen amphetamine-dextroamphetamine (ADDERALL XR) 15 MG 24 hr capsule TAKE ONE CAPSULE BY MOUTH EVERY MORNING 30 capsule 0  . cetirizine (ZYRTEC) 10 MG tablet Take 10 mg by mouth daily.    . clindamycin (CLEOCIN) 300 MG capsule Take 1 capsule (300 mg total) by mouth 3 (three) times daily. 30 capsule 0  . Colchicine 0.6 MG CAPS Take 1.2 mg by mouth daily.    . diazepam (VALIUM) 5 MG tablet TAKE ONE TABLET BY MOUTH EVERY TWELVE HOURS AS NEEDED FOR MIGRAINE HEADACHES 60 tablet 5  . folic acid (FOLVITE) 1 MG tablet Take 1 mg by mouth daily.    Marland Kitchen ibuprofen (ADVIL) 200 MG tablet Take 400 mg by mouth every 8 (eight) hours as needed (pain/headaches.).    Marland Kitchen lisinopril (ZESTRIL) 20 MG tablet Take 0.5 tablets (10 mg total) by mouth daily. 45 tablet 3  . metFORMIN (GLUCOPHAGE-XR) 500 MG 24 hr tablet TAKE ONE TABLET BY MOUTH TWICE DAILY WITH A MEAL 180 tablet 3  . methotrexate 50 MG/2ML injection Inject 25 mg into the muscle once a week.    . metoprolol tartrate (LOPRESSOR) 25 MG tablet Take 0.5  tablets (12.5 mg total) by mouth 2 (two) times daily. 90 tablet 3  . omeprazole (PRILOSEC) 40 MG capsule TAKE ONE CAPSULE BY MOUTH EVERY DAY 30 capsule 3  . OZEMPIC, 0.25 OR 0.5 MG/DOSE, 2 MG/1.5ML SOPN Inject 0.5 mg into the skin once a week. 3 mL 3  . predniSONE (DELTASONE) 5 MG tablet Take 5 mg by mouth daily.    . primidone (MYSOLINE) 50 MG tablet Take 50-100 mg by mouth See admin instructions. Take 1 tablet (50 mg) by mouth in the morning & take 2 tablets (100 mg) by mouth in the evening.    . rosuvastatin (CRESTOR) 20 MG tablet TAKE ONE TABLET BY MOUTH EVERY DAY 90 tablet 1  . sertraline (ZOLOFT) 100 MG tablet Take 1 tablet (100 mg total) by mouth daily. 90 tablet 3   No current facility-administered medications for this visit.     Past Surgical  History:  Procedure Laterality Date  . ABDOMINAL HYSTERECTOMY  1991   for fibroids; ovaries still in  . BREAST BIOPSY  2001; 02/12/16   fibrocystic breast disease in 2001 and again in 02/2016 (+scar from prev bx site w/calcifications).  . CARDIOVASCULAR STRESS TEST  11/01/2017   Myocard perf imaging: NORMAL (EF >60-65%)  . COLONOSCOPY  09/12/2013; 02/25/20   Normal 2015 and 2021: Recall 5 yrs ([facility], Dr. Evette Cristal) due to FH of colon polyps.  Manfred Shirts   for chronic UTI  . DEXA  10/2016   Bone density normal (T-score 0.0)  . ESOPHAGOGASTRODUODENOSCOPY  04/19/2011   Nl except antral gastritis: bx = mild chron gastritis (H pyloria neg)   . ESOPHAGOGASTRODUODENOSCOPY (EGD) WITH PROPOFOL N/A 03/27/2020   Esoph stenosis-->dilated.  Mod size hiatal hernia ([facility]). Procedure: ESOPHAGOGASTRODUODENOSCOPY (EGD) WITH PROPOFOL ;  Surgeon: Graylin Shiver, MD;  Location: [facility];  Service: Endoscopy;  Laterality: N/A;  . EVENT MONITOR  10/2017   3 wk-->Symptoms correlate with sinus rhythm with PACs, at times runs of PACs up to 5 beats.  Marland Kitchen GALLBLADDER SURGERY  2019  . intraarticular steroid injection  2013   3 R knee & L X 1; Dr Netta Corrigan  . KNEE ARTHROCENTESIS  2013   R knee x 3 & L X 1  . KNEE ARTHROSCOPY  10/21/2011   Procedure: ARTHROSCOPY KNEE;  Surgeon: Jacki Cones, MD;  Location: [facility];  Service: Orthopedics;  Laterality: Right;  WITH MEDIAL and lateral shaving of femoral chondyl  with microfracture technique of lateral and medial femoral chondyl suprapatellar synovectomy  . LAPAROSCOPIC CHOLECYSTECTOMY  01/2018  . SAVORY DILATION N/A 03/27/2020   Procedure: SAVORY DILATION;  Surgeon: Graylin Shiver, MD;  Location: [facility];  Service: Endoscopy;  Laterality: N/A;  . TOTAL KNEE ARTHROPLASTY  04/12/2012   Procedure: TOTAL KNEE ARTHROPLASTY;  Surgeon: Jacki Cones, MD;  Location: [facility];  Service: Orthopedics;  Laterality: Right;  Right Total Knee  Arthroplasty  . TUBAL LIGATION  1981     Allergies  Allergen Reactions  . Steri-Strip Compound Benzoin [Benzoin Compound] Anaphylaxis    Blisters and itching  . Penicillins Rash  . Other     Other reaction(s): stomach upset  . Pravastatin     Myalgias  . Simvastatin     Myalgias  . Hydrocodone-Acetaminophen Nausea And Vomiting      Family History  Problem Relation Age of Onset  . Diabetes Mother   . Stroke Mother 62  . Osteoarthritis Mother   .  Stroke Brother 24  . Heart disease Father        CABG  . Lung cancer Father   . Prostate cancer Father   . Pancreatic cancer Father   . Pancreatic cancer Paternal Grandfather   . Bipolar disorder Daughter   . Anxiety disorder Son   . Arthritis Maternal Grandmother        rheumatoid  . Heart attack Brother 68       smoker     Social History Ms. Pagliarulo reports that she has never smoked. She has never used smokeless tobacco. Ms. Mangini reports no history of alcohol use.   Review of Systems CONSTITUTIONAL: No weight loss, fever, chills, weakness or fatigue.  HEENT: Eyes: No visual loss, blurred vision, double vision or yellow sclerae.No hearing loss, sneezing, congestion, runny nose or sore throat.  SKIN: No rash or itching.  CARDIOVASCULAR: per hpi RESPIRATORY: No shortness of breath, cough or sputum.  GASTROINTESTINAL: No anorexia, nausea, vomiting or diarrhea. No abdominal pain or blood.  GENITOURINARY: No burning on urination, no polyuria NEUROLOGICAL: No headache, dizziness, syncope, paralysis, ataxia, numbness or tingling in the extremities. No change in bowel or bladder control.  MUSCULOSKELETAL: No muscle, back pain, joint pain or stiffness.  LYMPHATICS: No enlarged nodes. No history of splenectomy.  PSYCHIATRIC: No history of depression or anxiety.  ENDOCRINOLOGIC: No reports of sweating, cold or heat intolerance. No polyuria or polydipsia.  Marland Kitchen   Physical Examination Today's Vitals   09/15/20 1436   BP: 110/74  Pulse: 88  SpO2: 99%  Weight: 166 lb 9.6 oz (75.6 kg)  Height: 5' 8.5" (1.74 m)   Body mass index is 24.96 kg/m.  Gen: resting comfortably, no acute distress HEENT: no scleral icterus, pupils equal round and reactive, no palptable cervical adenopathy,  CV: RRR, no m/r/gl no jvd Resp: Clear to auscultation bilaterally GI: abdomen is soft, non-tender, non-distended, normal bowel sounds, no hepatosplenomegaly MSK: extremities are warm, no edema.  Skin: warm, no rash Neuro:  no focal deficits Psych: appropriate affect   Diagnostic Studies  10/2017 nuclear stress  There was no ST segment deviation noted during stress.  The study is normal. There are no perfusion defects  This is a low risk study.  The left ventricular ejection fraction is hyperdynamic (>65%).   11/2017 Monitor  21 day event monitor  Min HR 53, Max HR 146, Avg HR 76  Symptoms correlate with sinus rhythm with PACs, at times runs of PACs up to 5 beats    Assessment and Plan  1. PSVT - no recent symptoms, continue lopressor  2. Hyperlipidemia - at goal, continue statin   F/u 1 year   Antoine Poche, M.D.

## 2020-09-19 ENCOUNTER — Other Ambulatory Visit: Payer: Self-pay

## 2020-09-21 NOTE — Progress Notes (Signed)
OFFICE VISIT  09/22/2020  CC:  Chief Complaint  Patient presents with  . Follow-up    RCI, 3 mo. Pt is fasting    HPI:    Patient is a 65 y.o. Caucasian female who presents for 3 mo f/u HLD, adult ADD, HTN, chronic anx/dep. SHe has DM2, managed by Dr. Elvera Lennox in endo. Has psoriatic arth, managed by Dr. Nickola Major in rheum. A/P as of last visit: "1) HLD: tolerating rosuva  qd, hepatic panel normal 2 mo after starting this dose. FLP today.  2) Adult ADD: doing well on adderall xr  qd. No new rx for this med needed at this time. CSC and UDS UTD.  3) Anx/dep: stable.  Cont sertraline 100 mg qd.  4) HTN: stable on lisin  qd and metop 12.5 bid. Lytes/cr stable/normal 5 wks ago.  5) PSVT: doing well on 12.5mg  lopressor bid. Has f/u with cardiologist soon.   No changes today."  INTERIM HX: Doing well currently. However, says it is time for L knee replacement, will be seeing Dr. Charlann Boxer later this week and likely schedule after that.  Not currently taking anything for pain.  HLD: her LDL 3 mo ago was 80, continued  rosuva with plan of increasing dose if LDL not down below 70 today.  HTN:  Home bp's consistently <130/80.  Adult ADD:  Pt states all is going well with the med at current dosing: much improved focus, concentration, task completion.  Less frustration, better multitasking, less impulsivity and restlessness.  Mood is stable. No side effects from the medication.  Chronic anx/dep:  All stable/going well.  Lots of stressors right now yet "not wantin to hide in a hole".  Most recent diaz rx filled 08/09/20, #60, rx by me.  She takes this prn migraine HAs for the most part but also takes 1/2 tab at times for anx or insomnia.  PMP AWARE reviewed today: most recent rx for adderall xr  was filled 09/16/20, # 30, rx by me. No red flags.  ROS as above, plus--> no fevers, no CP, no SOB, no wheezing, no cough, no dizziness, no HAs, no rashes, no melena/hematochezia.   No polyuria or polydipsia.  No focal weakness, paresthesias, or tremors.  No acute vision or hearing abnormalities.  No dysuria or unusual/new urinary urgency or frequency.  No recent changes in lower legs. No n/v/d or abd pain.  No palpitations.     Past Medical History:  Diagnosis Date  . Acute medial meniscus tear of right knee   . Anemia of chronic disease    Mild, Hb stable consistently  . Anxiety and depression   . Chest pain 10/2017   +Cardiac CT.  Myocardial perfusion imaging NORMAL, EF>65%  . Diabetes mellitus 1995   Managed by Dr. Elvera Lennox (Endo)  . GERD (gastroesophageal reflux disease) per pt watches diet and take tums as needed  . H/O hiatal hernia    slightly larger ("moderate" size) on CT angio done to r/o PE 10/2017.  Marland Kitchen Hyperlipidemia   . Hypertension   . Interstitial cystitis   . Lichen sclerosus of female genitalia   . Migraine   . Osteoarthritis, multiple sites   . Palpitations 10/2017   3 wk event monitor: symptoms correlate with sinus rhythm with PACs, at times runs of PACs up to 5 beats.  . Pseudogout of knee, left    colchicine helpful  . Psoriasis   . Seasonal allergies   . Seropositive rheumatoid arthritis (HCC)  Hands, knees, jaw, elbow: responding to Simponi as of 09/29/15 rheum f/u.  Waning effectiveness on 03/2016 f/u so pt switched to Orencia injections.  Doing well on chronic low-dose prednisone therapy+ methotrexate +orencia as of 01/2019, 08/2019, 11/2019    Past Surgical History:  Procedure Laterality Date  . ABDOMINAL HYSTERECTOMY  1991   for fibroids; ovaries still in  . BREAST BIOPSY  2001; 02/12/16   fibrocystic breast disease in 2001 and again in 02/2016 (+scar from prev bx site w/calcifications).  . CARDIOVASCULAR STRESS TEST  11/01/2017   Myocard perf imaging: NORMAL (EF >60-65%)  . COLONOSCOPY  09/12/2013; 02/25/20   Normal 2015 and 2021: Recall 5 yrs ([facility], Dr. Evette Cristal) due to FH of colon polyps.  Manfred Shirts   for chronic UTI   . DEXA  10/2016   Bone density normal (T-score 0.0)  . ESOPHAGOGASTRODUODENOSCOPY  04/19/2011   Nl except antral gastritis: bx = mild chron gastritis (H pyloria neg)   . ESOPHAGOGASTRODUODENOSCOPY (EGD) WITH PROPOFOL N/A 03/27/2020   Esoph stenosis-->dilated.  Mod size hiatal hernia ([facility]). Procedure: ESOPHAGOGASTRODUODENOSCOPY (EGD) WITH PROPOFOL ;  Surgeon: Graylin Shiver, MD;  Location: [facility];  Service: Endoscopy;  Laterality: N/A;  . EVENT MONITOR  10/2017   3 wk-->Symptoms correlate with sinus rhythm with PACs, at times runs of PACs up to 5 beats.  Marland Kitchen GALLBLADDER SURGERY  2019  . intraarticular steroid injection  2013   3 R knee & L X 1; Dr Netta Corrigan  . KNEE ARTHROCENTESIS  2013   R knee x 3 & L X 1  . KNEE ARTHROSCOPY  10/21/2011   Procedure: ARTHROSCOPY KNEE;  Surgeon: Jacki Cones, MD;  Location: [facility];  Service: Orthopedics;  Laterality: Right;  WITH MEDIAL and lateral shaving of femoral chondyl  with microfracture technique of lateral and medial femoral chondyl suprapatellar synovectomy  . LAPAROSCOPIC CHOLECYSTECTOMY  01/2018  . SAVORY DILATION N/A 03/27/2020   Procedure: SAVORY DILATION;  Surgeon: Graylin Shiver, MD;  Location: [facility];  Service: Endoscopy;  Laterality: N/A;  . TOTAL KNEE ARTHROPLASTY  04/12/2012   Procedure: TOTAL KNEE ARTHROPLASTY;  Surgeon: Jacki Cones, MD;  Location: [facility];  Service: Orthopedics;  Laterality: Right;  Right Total Knee Arthroplasty  . TUBAL LIGATION  1981    Outpatient Medications Prior to Visit  Medication Sig Dispense Refill  . Abatacept (ORENCIA) 125 MG/ML SOSY Inject 125 mg into the skin every Monday.    . cetirizine (ZYRTEC) 10 MG tablet Take 10 mg by mouth daily.    . Colchicine 0.6 MG CAPS Take 1.2 mg by mouth daily.    . diazepam (VALIUM) 5 MG tablet TAKE ONE TABLET BY MOUTH EVERY TWELVE HOURS AS NEEDED FOR MIGRAINE HEADACHES 60 tablet 5  . folic acid (FOLVITE) 1 MG tablet Take 1 mg by  mouth daily.    Marland Kitchen lisinopril (ZESTRIL) 20 MG tablet Take 0.5 tablets (10 mg total) by mouth daily. 45 tablet 3  . metFORMIN (GLUCOPHAGE-XR) 500 MG 24 hr tablet TAKE ONE TABLET BY MOUTH TWICE DAILY WITH A MEAL 180 tablet 3  . methotrexate 50 MG/2ML injection Inject 25 mg into the muscle once a week.    . metoprolol tartrate (LOPRESSOR) 25 MG tablet Take 0.5 tablets (12.5 mg total) by mouth 2 (two) times daily. 90 tablet 3  . omeprazole (PRILOSEC) 40 MG capsule TAKE ONE CAPSULE BY MOUTH EVERY DAY 30 capsule 3  . OZEMPIC, 0.25  OR 0.5 MG/DOSE, 2 MG/1.5ML SOPN Inject 0.5 mg into the skin once a week. 3 mL 3  . predniSONE (DELTASONE) 5 MG tablet Take 5 mg by mouth daily.    . primidone (MYSOLINE) 50 MG tablet Take 50-100 mg by mouth See admin instructions. Take 1 tablet (50 mg) by mouth in the morning & take 2 tablets (100 mg) by mouth in the evening.    . rosuvastatin (CRESTOR) 20 MG tablet TAKE ONE TABLET BY MOUTH EVERY DAY 90 tablet 1  . sertraline (ZOLOFT) 100 MG tablet Take 1 tablet (100 mg total) by mouth daily. 90 tablet 3  . amphetamine-dextroamphetamine (ADDERALL XR) 15 MG 24 hr capsule TAKE ONE CAPSULE BY MOUTH EVERY MORNING 30 capsule 0  . ibuprofen (ADVIL) 200 MG tablet Take 400 mg by mouth every 8 (eight) hours as needed (pain/headaches.). (Patient not taking: Reported on 09/22/2020)     No facility-administered medications prior to visit.    Allergies  Allergen Reactions  . Steri-Strip Compound Benzoin [Benzoin Compound] Anaphylaxis    Blisters and itching  . Penicillins Rash  . Other     Other reaction(s): stomach upset  . Pravastatin     Myalgias  . Simvastatin     Myalgias  . Hydrocodone-Acetaminophen Nausea And Vomiting    ROS As per HPI  PE: Vitals with BMI 09/22/2020 09/15/2020 08/13/2020  Height 5' 8.5" 5' 8.5" -  Weight 168 lbs 166 lbs 10 oz -  BMI 25.17 24.96 -  Systolic 128 110 078  Diastolic 83 74 75  Pulse 71 88 77     Gen: Alert, well appearing.  Patient is  oriented to person, place, time, and situation. AFFECT: pleasant, lucid thought and speech. CV: RRR, no m/r/g.   LUNGS: CTA bilat, nonlabored resps, good aeration in all lung fields. EXT: no clubbing or cyanosis.  no edema.    LABS:  Lab Results  Component Value Date   TSH 1.37 11/09/2019   Lab Results  Component Value Date   WBC 7.8 05/16/2020   HGB 11.6 (A) 05/16/2020   HCT 35 (A) 05/16/2020   MCV 88.7 03/21/2020   PLT 254 05/16/2020   Lab Results  Component Value Date   CREATININE 0.9 05/16/2020   BUN 10 05/16/2020   NA 141 05/16/2020   K 4.2 05/16/2020   CL 105 05/16/2020   CO2 23 (A) 05/16/2020   Lab Results  Component Value Date   ALT 31 05/16/2020   AST 25 05/16/2020   ALKPHOS 67 05/16/2020   BILITOT 0.4 03/21/2020   Lab Results  Component Value Date   CHOL 153 06/23/2020   Lab Results  Component Value Date   HDL 49.90 06/23/2020   Lab Results  Component Value Date   LDLCALC 80 06/23/2020   Lab Results  Component Value Date   TRIG 120.0 06/23/2020   Lab Results  Component Value Date   CHOLHDL 3 06/23/2020   Lab Results  Component Value Date   HGBA1C 6.2 (A) 05/28/2020   IMPRESSION AND PLAN:  1) Adult ADD + chronic anx/dep: all stable. Doing well on current med regimen. I did electronic rx's for adderall xr 15mg , 1 qd, #30 today for April and May this year.  Appropriate fill on/after date was noted on each rx. No new rx for diaz needed today. CSC and UDS UTD.  2) HLD: tolerating rosuva 20mg  qd. FLP today, hepatic panel today. Goal LDL <70.  3) HTN: stable/well controlled. Cont lopressor 25mg ,  1/2 bid and lisin 20mg  1/2 qd. Lytes/cr today.  4) DM: per Dr. .  5) Psoriatic arth: per rheum/Dr. Oswaldo Conroy.  An After Visit Summary was printed and given to the patient.  FOLLOW UP: Return in about 3 months (around 12/23/2020).  Signed:  12/25/2020, MD           09/22/2020

## 2020-09-22 ENCOUNTER — Ambulatory Visit (INDEPENDENT_AMBULATORY_CARE_PROVIDER_SITE_OTHER): Payer: 59 | Admitting: Family Medicine

## 2020-09-22 ENCOUNTER — Encounter: Payer: Self-pay | Admitting: Family Medicine

## 2020-09-22 ENCOUNTER — Other Ambulatory Visit: Payer: Self-pay

## 2020-09-22 VITALS — BP 128/83 | HR 71 | Temp 97.8°F | Resp 16 | Ht 68.5 in | Wt 168.0 lb

## 2020-09-22 DIAGNOSIS — E7800 Pure hypercholesterolemia, unspecified: Secondary | ICD-10-CM | POA: Diagnosis not present

## 2020-09-22 DIAGNOSIS — F988 Other specified behavioral and emotional disorders with onset usually occurring in childhood and adolescence: Secondary | ICD-10-CM | POA: Diagnosis not present

## 2020-09-22 DIAGNOSIS — F419 Anxiety disorder, unspecified: Secondary | ICD-10-CM | POA: Diagnosis not present

## 2020-09-22 DIAGNOSIS — F32A Depression, unspecified: Secondary | ICD-10-CM

## 2020-09-22 DIAGNOSIS — I10 Essential (primary) hypertension: Secondary | ICD-10-CM | POA: Diagnosis not present

## 2020-09-22 DIAGNOSIS — Z23 Encounter for immunization: Secondary | ICD-10-CM

## 2020-09-22 LAB — COMPREHENSIVE METABOLIC PANEL
ALT: 70 U/L — ABNORMAL HIGH (ref 0–35)
AST: 59 U/L — ABNORMAL HIGH (ref 0–37)
Albumin: 4 g/dL (ref 3.5–5.2)
Alkaline Phosphatase: 54 U/L (ref 39–117)
BUN: 9 mg/dL (ref 6–23)
CO2: 27 mEq/L (ref 19–32)
Calcium: 8.7 mg/dL (ref 8.4–10.5)
Chloride: 102 mEq/L (ref 96–112)
Creatinine, Ser: 0.79 mg/dL (ref 0.40–1.20)
GFR: 78.6 mL/min (ref >60.00)
Glucose, Bld: 97 mg/dL (ref 70–99)
Potassium: 4.5 mEq/L (ref 3.5–5.1)
Sodium: 136 mEq/L (ref 135–145)
Total Bilirubin: 0.4 mg/dL (ref 0.2–1.2)
Total Protein: 6.4 g/dL (ref 6.0–8.3)

## 2020-09-22 LAB — LIPID PANEL
Cholesterol: 141 mg/dL (ref 0–200)
HDL: 48.5 mg/dL (ref >39.00)
LDL Cholesterol: 65 mg/dL (ref 0–99)
NonHDL: 92.17
Total CHOL/HDL Ratio: 3
Triglycerides: 135 mg/dL (ref 0.0–149.0)
VLDL: 27 mg/dL (ref 0.0–40.0)

## 2020-09-22 MED ORDER — AMPHETAMINE-DEXTROAMPHET ER 15 MG PO CP24
ORAL_CAPSULE | ORAL | 0 refills | Status: DC
Start: 2020-09-22 — End: 2020-09-22

## 2020-09-22 MED ORDER — AMPHETAMINE-DEXTROAMPHET ER 15 MG PO CP24: ORAL_CAPSULE | ORAL | 0 refills | Status: DC

## 2020-10-16 ENCOUNTER — Other Ambulatory Visit: Payer: Self-pay | Admitting: Family Medicine

## 2020-11-17 ENCOUNTER — Ambulatory Visit: Payer: 59 | Attending: Internal Medicine

## 2020-11-17 DIAGNOSIS — Z23 Encounter for immunization: Secondary | ICD-10-CM

## 2020-11-17 NOTE — Progress Notes (Signed)
   Covid-19 Vaccination Clinic  Name:  Raven [name]    MRN: [id number] DOB: February 15, 1956  11/17/2020  [name] was observed post Covid-19 immunization for 15 minutes without incident. She was provided with Vaccine Information Sheet and instruction to access the V-Safe system.   Ms. Court was instructed to call 911 with any severe reactions post vaccine: Marland Kitchen Difficulty breathing  . Swelling of face and throat  . A fast heartbeat  . A bad rash all over body  . Dizziness and weakness   Immunizations Administered    Name Date Dose VIS Date Route   PFIZER Comrnaty(Gray TOP) Covid-19 Vaccine 11/17/2020  1:58 PM 0.3 mL 06/19/2020 Intramuscular   Manufacturer: [employer], [employer]   Lot: [id number]   NDC: [phone]

## 2020-11-21 ENCOUNTER — Other Ambulatory Visit (HOSPITAL_BASED_OUTPATIENT_CLINIC_OR_DEPARTMENT_OTHER): Payer: Self-pay

## 2020-11-21 MED ORDER — PFIZER-BIONT COVID-19 VAC-TRIS 30 MCG/0.3ML IM SUSP
INTRAMUSCULAR | 0 refills | Status: DC
  Filled 2020-11-21: qty 0.3, 1d supply, fill #0

## 2020-11-27 ENCOUNTER — Other Ambulatory Visit: Payer: Self-pay

## 2020-11-27 ENCOUNTER — Encounter: Payer: Self-pay | Admitting: Internal Medicine

## 2020-11-27 ENCOUNTER — Ambulatory Visit: Payer: Self-pay | Admitting: Internal Medicine

## 2020-11-27 VITALS — BP 128/88 | HR 84 | Ht 68.5 in | Wt 169.8 lb

## 2020-11-27 DIAGNOSIS — E119 Type 2 diabetes mellitus without complications: Secondary | ICD-10-CM

## 2020-11-27 DIAGNOSIS — E663 Overweight: Secondary | ICD-10-CM

## 2020-11-27 DIAGNOSIS — E785 Hyperlipidemia, unspecified: Secondary | ICD-10-CM

## 2020-11-27 DIAGNOSIS — Z794 Long term (current) use of insulin: Secondary | ICD-10-CM

## 2020-11-27 LAB — COMPREHENSIVE METABOLIC PANEL
Albumin: 4.1 (ref 3.5–5.0)
Calcium: 8.9 (ref 8.7–10.7)
GFR calc Af Amer: 75
Globulin: 2.3

## 2020-11-27 LAB — POCT GLYCOSYLATED HEMOGLOBIN (HGB A1C): Hemoglobin A1C: 6.8 % — AB (ref 4.0–5.6)

## 2020-11-27 LAB — HEPATIC FUNCTION PANEL
ALT: 58 — AB (ref 7–35)
AST: 47 — AB (ref 13–35)
Alkaline Phosphatase: 71 (ref 25–125)
Bilirubin, Total: 0.3

## 2020-11-27 LAB — BASIC METABOLIC PANEL
BUN: 11 (ref 4–21)
CO2: 19 (ref 13–22)
Chloride: 103 (ref 99–108)
Creatinine: 0.9 (ref 0.5–1.1)
Glucose: 192
Potassium: 4.2 (ref 3.4–5.3)
Sodium: 137 (ref 137–147)

## 2020-11-27 LAB — CBC: RBC: 3.92 (ref 3.87–5.11)

## 2020-11-27 LAB — CBC AND DIFFERENTIAL
HCT: 33 — AB (ref 36–46)
Hemoglobin: 11.2 — AB (ref 12.0–16.0)
Neutrophils Absolute: 3.8
Platelets: 232 (ref 150–399)
WBC: 6.9

## 2020-11-27 NOTE — Patient Instructions (Signed)
   Please continue: - Metformin ER 500 mg 2x daily  Restart: - Ozempic 0.5 mg weekly  Please come back for a follow-up appointment in 6 months.

## 2020-11-27 NOTE — Progress Notes (Signed)
Patient ID: Raven [name], female   DOB: 1956/02/06, 65 y.o.   MRN: [id number]  This visit occurred during the SARS-CoV-2 public health emergency.  Safety protocols were in place, including screening questions prior to the visit, additional usage of staff PPE, and extensive cleaning of exam room while observing appropriate contact time as indicated for disinfecting solutions.   HPI: Raven [name] is a 65 y.o.-year-old female, returning for f/u for DM2, dx ~1995, insulin-dependent since 11/2013 - now insulin-independent, controlled, without long-term complications. Last visit 6 months ago.  Interim history: Before last visit, she started Adderall and she did much better with remembering to take her medications.  She continues on the medication.  She had COVID-19 in 07/2020 (second infection).  She recovered well, but was sick for 4 weeks. She has L knee pain >> had 1 steroid taper and 1 inj. >> sugars increased to 250. She will have a TKR. She is still working but plans to retire next year. She was dx'ed with PSVT after a syncopal episode at church. On Metoprolol now. She has congestion  -allergies.  Reviewed HbA1c levels: Lab Results  Component Value Date   HGBA1C 6.2 (A) 05/28/2020   HGBA1C 6.6 (A) 11/26/2019   HGBA1C 5.8 (A) 07/23/2019   She has RA >> was on Simponi (changed from Remicade) and MTX >> started Orencia and stays on MTX. On Plaquenil.  On prednisone 5 mg daily.  Pt is on a regimen of: - Metformin ER 1500 >> 500 mg 2x a day - Ozempic 0.5 mg weekly - started 12/2018 -she had initially nausea, now resolved - but could not get it in last 4-5 weeks. Previously on Levemir 14 >> 10 >> 4-6 units at bedtime >> stopped 07/2019 She tried Glimepiride in the past >> fluctuating blood sugar. We stopped glipizide and Januvia only started Ozempic 12/2018.  She had a CGM in the past but could not afford it anymore.  She checks sugars 2x a day: - am: 125 (off Ozempic) >> 101-114  >> 104-120 >> 101-138 - 2h after b'fast:  120-130 >> n/c >> 120-140 >> n/c - before lunch: n/c >> 110-120 >> n/c - 2h after lunch: n/c >> 140-154 >> n/c  - before dinner: 130-150 >> 150 >> n/c - 2h after dinner:  160-180 (off Ozempic) >> 150 >> 134-163 >> 168-188 - bedtime: 110-120 >> n/c   - nighttime: n/c >> 100-150 >> n/c Lowest sugar was 52 (x2 -Glipizide) ... >> 104 >> 101;  she has hypoglycemia awareness in the 70s. Highest sugar was 324 ...180 >> 163 >> 250 (steroid).  Meter: ReliOn.  Pt's meals are: - Breakfast: scrambled eggs + bacon + tomatoes - Lunch: meat + 2 veggies + no bread unless burger - Dinner: meat + 2 veggies - Snacks: 2: nuts; cheese;   -No CKD, last BUN/creatinine:  Lab Results  Component Value Date   BUN 9 09/22/2020   CREATININE 0.79 09/22/2020  On lisinopril 10. -+ HL; last set of lipids: Lab Results  Component Value Date   CHOL 141 09/22/2020   HDL 48.50 09/22/2020   LDLCALC 65 09/22/2020   LDLDIRECT 142.3 03/06/2008   TRIG 135.0 09/22/2020   CHOLHDL 3 09/22/2020  She was taken off statins in the past due to transaminitis in 10/2013.  Atorvastatin caused muscle cramps.  Currently on Crestor 10 and fish oil. - last eye exam 04/2019: No DR.  She had cataract sx's 05/2018. Dr. Charise Killian. - Denies numbness and  tingling in her feet.  Started on Omeprazole for choking >> resolved.  She had an esophageal dilation 03/2020.  ROS: Constitutional: no weight gain/no weight loss, no fatigue, no subjective hyperthermia, no subjective hypothermia Eyes: no blurry vision, no xerophthalmia ENT: no sore throat, no nodules palpated in neck, no dysphagia, no odynophagia, no hoarseness Cardiovascular: no CP/no SOB/no palpitations/no leg swelling Respiratory: no cough/no SOB/no wheezing Gastrointestinal: no N/no V/no D/no C/no acid reflux Musculoskeletal: no muscle aches/+ joint aches Skin: no rashes, no hair loss Neurological: + tremors/no numbness/no tingling/no  dizziness  I reviewed pt's medications, allergies, PMH, social hx, family hx, and changes were documented in the history of present illness. Otherwise, unchanged from my initial visit note.  Past Medical History:  Diagnosis Date  . Acute medial meniscus tear of right knee   . Anemia of chronic disease    Mild, Hb stable consistently  . Anxiety and depression   . Chest pain 10/2017   +Cardiac CT.  Myocardial perfusion imaging NORMAL, EF>65%  . Diabetes mellitus 1995   Managed by Dr. Elvera Lennox (Endo)  . GERD (gastroesophageal reflux disease) per pt watches diet and take tums as needed  . H/O hiatal hernia    slightly larger ("moderate" size) on CT angio done to r/o PE 10/2017.  Marland Kitchen Hyperlipidemia   . Hypertension   . Interstitial cystitis   . Lichen sclerosus of female genitalia   . Migraine   . Osteoarthritis, multiple sites   . Palpitations 10/2017   3 wk event monitor: symptoms correlate with sinus rhythm with PACs, at times runs of PACs up to 5 beats.  . Pseudogout of knee, left    colchicine helpful  . Psoriasis   . Seasonal allergies   . Seropositive rheumatoid arthritis (HCC)    Hands, knees, jaw, elbow: responding to Simponi as of 09/29/15 rheum f/u.  Waning effectiveness on 03/2016 f/u so pt switched to Orencia injections.  Doing well on chronic low-dose prednisone therapy+ methotrexate +orencia as of 01/2019, 08/2019, 11/2019    Past Surgical History:  Procedure Laterality Date  . ABDOMINAL HYSTERECTOMY  1991   for fibroids; ovaries still in  . BREAST BIOPSY  2001; 02/12/16   fibrocystic breast disease in 2001 and again in 02/2016 (+scar from prev bx site w/calcifications).  . CARDIOVASCULAR STRESS TEST  11/01/2017   Myocard perf imaging: NORMAL (EF >60-65%)  . COLONOSCOPY  09/12/2013; 02/25/20   Normal 2015 and 2021: Recall 5 yrs ([facility], Dr. Evette Cristal) due to FH of colon polyps.  Manfred Shirts   for chronic UTI  . DEXA  10/2016   Bone density normal (T-score 0.0)  .  ESOPHAGOGASTRODUODENOSCOPY  04/19/2011   Nl except antral gastritis: bx = mild chron gastritis (H pyloria neg)   . ESOPHAGOGASTRODUODENOSCOPY (EGD) WITH PROPOFOL N/A 03/27/2020   Esoph stenosis-->dilated.  Mod size hiatal hernia ([facility]). Procedure: ESOPHAGOGASTRODUODENOSCOPY (EGD) WITH PROPOFOL ;  Surgeon: Graylin Shiver, MD;  Location: [facility];  Service: Endoscopy;  Laterality: N/A;  . EVENT MONITOR  10/2017   3 wk-->Symptoms correlate with sinus rhythm with PACs, at times runs of PACs up to 5 beats.  Marland Kitchen GALLBLADDER SURGERY  2019  . intraarticular steroid injection  2013   3 R knee & L X 1; Dr Netta Corrigan  . KNEE ARTHROCENTESIS  2013   R knee x 3 & L X 1  . KNEE ARTHROSCOPY  10/21/2011   Procedure: ARTHROSCOPY KNEE;  Surgeon: Jacki Cones, MD;  Location: [facility];  Service: Orthopedics;  Laterality: Right;  WITH MEDIAL and lateral shaving of femoral chondyl  with microfracture technique of lateral and medial femoral chondyl suprapatellar synovectomy  . LAPAROSCOPIC CHOLECYSTECTOMY  01/2018  . SAVORY DILATION N/A 03/27/2020   Procedure: SAVORY DILATION;  Surgeon: Graylin Shiver, MD;  Location: [facility];  Service: Endoscopy;  Laterality: N/A;  . TOTAL KNEE ARTHROPLASTY  04/12/2012   Procedure: TOTAL KNEE ARTHROPLASTY;  Surgeon: Jacki Cones, MD;  Location: [facility];  Service: Orthopedics;  Laterality: Right;  Right Total Knee Arthroplasty  . TUBAL LIGATION  1981    Social History   Socioeconomic History  . Marital status: Married    Spouse name: Not on file  . Number of children: Not on file  . Years of education: Not on file  . Highest education level: Not on file  Occupational History  . Not on file  Tobacco Use  . Smoking status: Never Smoker  . Smokeless tobacco: Never Used  Vaping Use  . Vaping Use: Never used  Substance and Sexual Activity  . Alcohol use: No  . Drug use: No  . Sexual activity: Not on file  Other Topics Concern  . Not on  file  Social History Narrative   Married, 3 children (2 in [place], 1 in [place]).  4 grandchildren.   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)   No tob/alc/drugs.   2 cups of coffee/day x 3 months   Regular exercise- no-due to arthritis.   Religion affecting care, "it allows stress management:"   Social Determinants of Health   Financial Resource Strain: Not on file  Food Insecurity: Not on file  Transportation Needs: Not on file  Physical Activity: Not on file  Stress: Not on file  Social Connections: Not on file  Intimate Partner Violence: Not on file    Current Outpatient Medications on File Prior to Visit  Medication Sig Dispense Refill  . Abatacept (ORENCIA) 125 MG/ML SOSY Inject 125 mg into the skin every Monday.    Marland Kitchen amphetamine-dextroamphetamine (ADDERALL XR) 15 MG 24 hr capsule TAKE ONE CAPSULE BY MOUTH EVERY MORNING 30 capsule 0  . cetirizine (ZYRTEC) 10 MG tablet Take 10 mg by mouth daily.    . Colchicine 0.6 MG CAPS Take 1.2 mg by mouth daily.    Marland Kitchen COVID-19 mRNA Vac-TriS, Pfizer, (PFIZER-BIONT COVID-19 VAC-TRIS) SUSP injection Inject into the muscle. 0.3 mL 0  . diazepam (VALIUM) 5 MG tablet TAKE ONE TABLET BY MOUTH EVERY TWELVE HOURS AS NEEDED FOR MIGRAINE HEADACHES 60 tablet 5  . folic acid (FOLVITE) 1 MG tablet Take 1 mg by mouth daily.    Marland Kitchen ibuprofen (ADVIL) 200 MG tablet Take 400 mg by mouth every 8 (eight) hours as needed (pain/headaches.). (Patient not taking: Reported on 09/22/2020)    . lisinopril (ZESTRIL) 20 MG tablet Take 0.5 tablets (10 mg total) by mouth daily. 45 tablet 3  . metFORMIN (GLUCOPHAGE-XR) 500 MG 24 hr tablet TAKE ONE TABLET BY MOUTH TWICE DAILY WITH A MEAL 180 tablet 3  . methotrexate 50 MG/2ML injection Inject 25 mg into the muscle once a week.    . metoprolol tartrate (LOPRESSOR) 25 MG tablet Take 0.5 tablets (12.5 mg total) by mouth 2 (two) times daily. 90 tablet 3  . omeprazole (PRILOSEC) 40 MG capsule TAKE ONE CAPSULE BY MOUTH EVERY  DAY 30 capsule 3  . OZEMPIC, 0.25 OR 0.5 MG/DOSE, 2 MG/1.5ML SOPN Inject 0.5 mg into the skin  once a week. 3 mL 3  . predniSONE (DELTASONE) 5 MG tablet Take 5 mg by mouth daily.    . primidone (MYSOLINE) 50 MG tablet Take 50-100 mg by mouth See admin instructions. Take 1 tablet (50 mg) by mouth in the morning & take 2 tablets (100 mg) by mouth in the evening.    . rosuvastatin (CRESTOR) 20 MG tablet TAKE ONE TABLET BY MOUTH EVERY DAY 90 tablet 1  . sertraline (ZOLOFT) 100 MG tablet Take 1 tablet (100 mg total) by mouth daily. 90 tablet 3   No current facility-administered medications on file prior to visit.    Allergies  Allergen Reactions  . Steri-Strip Compound Benzoin [Benzoin Compound] Anaphylaxis    Blisters and itching  . Penicillins Rash  . Other     Other reaction(s): stomach upset  . Pravastatin     Myalgias  . Simvastatin     Myalgias  . Hydrocodone-Acetaminophen Nausea And Vomiting    Family History  Problem Relation Age of Onset  . Diabetes Mother   . Stroke Mother 50  . Osteoarthritis Mother   . Stroke Brother 29  . Heart disease Father        CABG  . Lung cancer Father   . Prostate cancer Father   . Pancreatic cancer Father   . Pancreatic cancer Paternal Grandfather   . Bipolar disorder Daughter   . Anxiety disorder Son   . Arthritis Maternal Grandmother        rheumatoid  . Heart attack Brother 47       smoker   PE: BP 128/88 (BP Location: Right Arm, Patient Position: Sitting, Cuff Size: Normal)   Pulse 84   Ht 5' 8.5" (1.74 m)   Wt 169 lb 12.8 oz (77 kg)   SpO2 97%   BMI 25.44 kg/m  Body mass index is 25.44 kg/m.  Wt Readings from Last 3 Encounters:  11/27/20 169 lb 12.8 oz (77 kg)  09/22/20 168 lb (76.2 kg)  09/15/20 166 lb 9.6 oz (75.6 kg)   Constitutional: overweight, in NAD Eyes: PERRLA, EOMI, no exophthalmos ENT: moist mucous membranes, no thyromegaly, no cervical lymphadenopathy Cardiovascular: RRR, No MRG Respiratory: CTA  B Gastrointestinal: abdomen soft, NT, ND, BS+ Musculoskeletal: no deformities, strength intact in all 4 Skin: moist, warm, no rashes Neurological: no tremor with outstretched hands, DTR normal in all 4  ASSESSMENT: 1. DM2, insulin-independent, controlled, without long term complications, but with Hgly -She was on the freestyle libre CGM in the past but cannot afford it anymore.  2. HL  3.  Overweight  PLAN:  1. Patient with longstanding, previously uncontrolled diabetes, with improved control after addition of a GLP-1 receptor agonist to her half maximal dose of metformin ER.  We cannot use a higher dose due to diarrhea.  Her sugars improved enough so we were able to stop insulin.  At last visit, sugars were at goal, without significant hyper or hypoglycemic excursions.  She did report some nausea, which may have been related to GERD but she was also taking her folic acid in the morning and I advised her to move it at night due to possible nausea with the supplement.  Her nausea resolved afterwards.  HbA1c at last visit was excellent, at 6.2%, decreased. -At this visit, sugars are still very good in the morning but they are higher later in the day.  Upon questioning, she is off Ozempic now for 5 weeks due to problems with obtaining it from  the pharmacy.  At today's visit, we submitted a preauthorization for this and hopefully we can restart it.  I discussed with her about benefits of Ozempic and she would definitely want to restart-discussed about other options to obtain it in case this is not covered by her insurance anymore.  We will continue metformin for now, which she tolerates well. - I suggested to:  Patient Instructions  Please continue: - Metformin ER 500 mg 2x daily  Restart: - Ozempic 0.5 mg weekly  Please come back for a follow-up appointment in 6 months.  - we checked her HbA1c: 6.8% (higher) - advised to check sugars at different times of the day - 1x a day, rotating check  times - advised for yearly eye exams >> she is UTD - return to clinic in 6 months   2. HL -Reviewed latest lipid panel from 09/2020: Excellent lipid fractions: Lab Results  Component Value Date   CHOL 141 09/22/2020   HDL 48.50 09/22/2020   LDLCALC 65 09/22/2020   LDLDIRECT 142.3 03/06/2008   TRIG 135.0 09/22/2020   CHOLHDL 3 09/22/2020  -Continues Crestor 10 mg daily and fish oil, without side effects  3.  Overweight -We will continue Ozempic which should also help with weight loss -After starting Ozempic, she lost more than 40 pounds, but weight stabilized afterwards  Carlus Pavlov, MD PhD [facility]

## 2020-12-02 ENCOUNTER — Other Ambulatory Visit: Payer: Self-pay | Admitting: Family Medicine

## 2020-12-02 ENCOUNTER — Encounter: Payer: Self-pay | Admitting: Family Medicine

## 2020-12-02 NOTE — Telephone Encounter (Signed)
Requesting: diazepam Contract: 03/21/20 UDS: 03/21/20 Last Visit:09/22/20 Next Visit:12/31/20 Last Refill: 04/04/20(60,5)  RF request for sertraline  LOV:09/22/20 Next ov: 12/31/20 Last written: 11/09/19(90,3)   RF request for omeprazole LOV:09/22/20 Next ov: 12/31/20 Last written: 07/14/20 (30,3)  Meds pending. Please advise

## 2020-12-03 NOTE — Telephone Encounter (Signed)
Pt was made aware refills sent.

## 2020-12-26 ENCOUNTER — Other Ambulatory Visit: Payer: Self-pay

## 2020-12-30 LAB — HEPATIC FUNCTION PANEL
ALT: 52 — AB (ref 7–35)
AST: 37 — AB (ref 13–35)
Alkaline Phosphatase: 71 (ref 25–125)
Bilirubin, Total: 0.3

## 2020-12-30 LAB — COMPREHENSIVE METABOLIC PANEL: Albumin: 4 (ref 3.5–5.0)

## 2020-12-31 ENCOUNTER — Ambulatory Visit (INDEPENDENT_AMBULATORY_CARE_PROVIDER_SITE_OTHER): Payer: 59 | Admitting: Family Medicine

## 2020-12-31 ENCOUNTER — Encounter: Payer: Self-pay | Admitting: Family Medicine

## 2020-12-31 ENCOUNTER — Other Ambulatory Visit: Payer: Self-pay

## 2020-12-31 VITALS — BP 117/79 | HR 76 | Temp 97.9°F | Wt 167.4 lb

## 2020-12-31 DIAGNOSIS — D649 Anemia, unspecified: Secondary | ICD-10-CM

## 2020-12-31 DIAGNOSIS — E7800 Pure hypercholesterolemia, unspecified: Secondary | ICD-10-CM

## 2020-12-31 DIAGNOSIS — R7401 Elevation of levels of liver transaminase levels: Secondary | ICD-10-CM | POA: Diagnosis not present

## 2020-12-31 DIAGNOSIS — Z79899 Other long term (current) drug therapy: Secondary | ICD-10-CM

## 2020-12-31 DIAGNOSIS — I10 Essential (primary) hypertension: Secondary | ICD-10-CM

## 2020-12-31 DIAGNOSIS — F988 Other specified behavioral and emotional disorders with onset usually occurring in childhood and adolescence: Secondary | ICD-10-CM

## 2020-12-31 MED ORDER — LISINOPRIL 20 MG PO TABS: 10.0000 mg | ORAL_TABLET | Freq: Every day | ORAL | 3 refills | Status: DC

## 2020-12-31 MED ORDER — ROSUVASTATIN CALCIUM 20 MG PO TABS: 20.0000 mg | ORAL_TABLET | Freq: Every day | ORAL | 3 refills | Status: DC

## 2020-12-31 MED ORDER — AMPHETAMINE-DEXTROAMPHET ER 15 MG PO CP24: ORAL_CAPSULE | ORAL | 0 refills | Status: DC

## 2020-12-31 NOTE — Progress Notes (Signed)
OFFICE VISIT  12/31/2020  CC:  Chief Complaint  Patient presents with   Follow-up    RCI; pt is fasting    HPI:    Patient is a 65 y.o. Caucasian female who presents for 3 mo f/u HLD, adult ADD, HTN, chronic anx/dep. SHe has DM2, managed by Dr. Elvera Lennox in endo. Has psoriatic arth, managed by Dr. Nickola Major in rheum. A/P as of last visit: "1) Adult ADD + chronic anx/dep: all stable. Doing well on current med regimen. I did electronic rx's for adderall xr , 1 qd, #30 today for April and May this year.  Appropriate fill on/after date was noted on each rx. No new rx for diaz needed today. CSC and UDS UTD.   2) HLD: tolerating rosuva  qd. FLP today, hepatic panel today. Goal LDL <70.   3) HTN: stable/well controlled. Cont lopressor , 1/2 bid and lisin  1/2 qd. Lytes/cr today.   4) DM: per Dr. Oswaldo Conroy.   5) Psoriatic arth: per rheum/Dr. Lendon Colonel."  INTERIM HX: All labs stable last visit--very mild AST and ALT elevation.  Mild normocytic anemia. Pain from her inflamm arth is bad and she'll talk with Dr. Lendon Colonel about potential replacement for her orencia.  Takes only occ ibup for pain. Hands, knees, and ankles are the focus of her pain.   L knee has intermittent swelling, got aspirated by ortho relatively recently, plan is for TKA sometime after 02/23/21.  HTN: home bp's normal.    HLD: rosuva 20 qd.  Adult ADD: Pt states all is going well with the med at current dosing: much improved focus, concentration, task completion.  Less frustration, better multitasking, less impulsivity and restlessness.  Mood is stable. No side effects from the medication.   PMP AWARE reviewed today: most recent rx for adderall xr  was filled 12/02/20, # 30, rx by me. No red flags.  Mood/anx: doing well.  Taking diaz 1/2-1 tab qhs for anxiety-related insomnia.  Most recent diazepam  rx filled 12/03/20, #30, rx by me.  ROS as above, plus--> no fevers, no CP, no SOB, no  wheezing, no cough, no dizziness, no HAs, no rashes, no melena/hematochezia.  No polyuria or polydipsia.  No myalgias.  No focal weakness, paresthesias, or tremors.  No acute vision or hearing abnormalities.  No dysuria or unusual/new urinary urgency or frequency.  No recent changes in lower legs. No n/v/d or abd pain.  No palpitations.    Past Medical History:  Diagnosis Date   Acute medial meniscus tear of right knee    Anemia of chronic disease    Mild, Hb stable consistently   Anxiety and depression    Chest pain 10/2017   +Cardiac CT.  Myocardial perfusion imaging NORMAL, EF>65%   Diabetes mellitus 1995   Managed by Dr. Elvera Lennox (Endo)   GERD (gastroesophageal reflux disease) per pt watches diet and take tums as needed   H/O hiatal hernia    slightly larger ("moderate" size) on CT angio done to r/o PE 10/2017.   Hyperlipidemia    Hypertension    Interstitial cystitis    Lichen sclerosus of female genitalia    Migraine    Osteoarthritis, multiple sites    Palpitations 10/2017   3 wk event monitor: symptoms correlate with sinus rhythm with PACs, at times runs of PACs up to 5 beats.   Pseudogout of knee, left    colchicine helpful   Psoriasis    Seasonal allergies    Seropositive rheumatoid arthritis (  HCC)    Hands, knees, jaw, elbow: responding to Simponi as of 09/29/15 rheum f/u.  Waning effectiveness on 03/2016 f/u so pt switched to Orencia injections.  Doing well on chronic low-dose prednisone therapy+ methotrexate +orencia as of 01/2019, 08/2019, 11/2019    Past Surgical History:  Procedure Laterality Date   ABDOMINAL HYSTERECTOMY  1991   for fibroids; ovaries still in   BREAST BIOPSY  2001; 02/12/16   fibrocystic breast disease in 2001 and again in 02/2016 (+scar from prev bx site w/calcifications).   CARDIOVASCULAR STRESS TEST  11/01/2017   Myocard perf imaging: NORMAL (EF >60-65%)   COLONOSCOPY  09/12/2013; 02/25/20   Normal 2015 and 2021: Recall 5 yrs ([facility], Dr. Evette Cristal)  due to FH of colon polyps.   CYSTOSCOPY  2004   for chronic UTI   DEXA  10/2016   Bone density normal (T-score 0.0)   ESOPHAGOGASTRODUODENOSCOPY  04/19/2011   Nl except antral gastritis: bx = mild chron gastritis (H pyloria neg)    ESOPHAGOGASTRODUODENOSCOPY (EGD) WITH PROPOFOL N/A 03/27/2020   Esoph stenosis-->dilated.  Mod size hiatal hernia ([facility]). Procedure: ESOPHAGOGASTRODUODENOSCOPY (EGD) WITH PROPOFOL ;  Surgeon: Graylin Shiver, MD;  Location: [facility];  Service: Endoscopy;  Laterality: N/A;   EVENT MONITOR  10/2017   3 wk-->Symptoms correlate with sinus rhythm with PACs, at times runs of PACs up to 5 beats.   GALLBLADDER SURGERY  2019   intraarticular steroid injection  2013   3 R knee & L X 1; Dr Netta Corrigan   KNEE ARTHROCENTESIS  2013   R knee x 3 & L X 1   KNEE ARTHROSCOPY  10/21/2011   Procedure: ARTHROSCOPY KNEE;  Surgeon: Jacki Cones, MD;  Location: [facility];  Service: Orthopedics;  Laterality: Right;  WITH MEDIAL and lateral shaving of femoral chondyl  with microfracture technique of lateral and medial femoral chondyl suprapatellar synovectomy   LAPAROSCOPIC CHOLECYSTECTOMY  01/2018   SAVORY DILATION N/A 03/27/2020   Procedure: SAVORY DILATION;  Surgeon: Graylin Shiver, MD;  Location: [facility];  Service: Endoscopy;  Laterality: N/A;   TOTAL KNEE ARTHROPLASTY  04/12/2012   RIGHT  Procedure: TOTAL KNEE ARTHROPLASTY;  Surgeon: Jacki Cones, MD;  Location: [facility];  Service: Orthopedics;  Laterality: Right;  Right Total Knee Arthroplasty   TUBAL LIGATION  1981    Outpatient Medications Prior to Visit  Medication Sig Dispense Refill   Abatacept (ORENCIA) 125 MG/ML SOSY Inject 125 mg into the skin every Monday.     cetirizine (ZYRTEC) 10 MG tablet Take 10 mg by mouth daily.     Colchicine 0.6 MG CAPS Take 1.2 mg by mouth daily.     diazepam (VALIUM) 5 MG tablet TAKE ONE TABLET BY MOUTH EVERY TWELVE HOURS AS NEEDED FOR MIGRAINE HEADACHES 60  tablet 5   folic acid (FOLVITE) 1 MG tablet Take 1 mg by mouth daily.     ibuprofen (ADVIL) 200 MG tablet Take 400 mg by mouth every 8 (eight) hours as needed (pain/headaches.).     metFORMIN (GLUCOPHAGE-XR) 500 MG 24 hr tablet TAKE ONE TABLET BY MOUTH TWICE DAILY WITH A MEAL 180 tablet 3   methotrexate 50 MG/2ML injection Inject 25 mg into the muscle once a week.     metoprolol tartrate (LOPRESSOR) 25 MG tablet Take 0.5 tablets (12.5 mg total) by mouth 2 (two) times daily. 90 tablet 3   omeprazole (PRILOSEC) 40 MG capsule TAKE ONE CAPSULE BY MOUTH DAILY  90 capsule 3   OZEMPIC, 0.25 OR 0.5 MG/DOSE, 2 MG/1.5ML SOPN Inject 0.5 mg into the skin once a week. 3 mL 3   predniSONE (DELTASONE) 5 MG tablet Take 5 mg by mouth daily.     primidone (MYSOLINE) 50 MG tablet Take 50-100 mg by mouth See admin instructions. Take 1 tablet (50 mg) by mouth in the morning & take 2 tablets (100 mg) by mouth in the evening.     sertraline (ZOLOFT) 100 MG tablet TAKE ONE TABLET BY MOUTH EVERY DAY 90 tablet 3   amphetamine-dextroamphetamine (ADDERALL XR) 15 MG 24 hr capsule TAKE ONE CAPSULE BY MOUTH EVERY MORNING 30 capsule 0   lisinopril (ZESTRIL) 20 MG tablet Take 0.5 tablets (10 mg total) by mouth daily. 45 tablet 3   rosuvastatin (CRESTOR) 20 MG tablet TAKE ONE TABLET BY MOUTH EVERY DAY 90 tablet 1   No facility-administered medications prior to visit.    Allergies  Allergen Reactions   Steri-Strip Compound Benzoin [Benzoin Compound] Anaphylaxis    Blisters and itching   Penicillins Rash   Other     Other reaction(s): stomach upset   Pravastatin     Myalgias   Simvastatin     Myalgias   Hydrocodone-Acetaminophen Nausea And Vomiting    ROS As per HPI  PE: Vitals with BMI 12/31/2020 11/27/2020 09/22/2020  Height - 5' 8.5" 5' 8.5"  Weight 167 lbs 6 oz 169 lbs 13 oz 168 lbs  BMI - 25.44 25.17  Systolic 117 128 016  Diastolic 79 88 83  Pulse 76 84 71   Gen: Alert, well appearing.  Patient is oriented  to person, place, time, and situation. AFFECT: pleasant, lucid thought and speech. CV: RRR, no m/r/g.   LUNGS: CTA bilat, nonlabored resps, good aeration in all lung fields. EXT: no clubbing or cyanosis.  no edema.   LABS:  Lab Results  Component Value Date   TSH 1.37 11/09/2019   Lab Results  Component Value Date   WBC 7.8 05/16/2020   HGB 11.6 (A) 05/16/2020   HCT 35 (A) 05/16/2020   MCV 88.7 03/21/2020   PLT 254 05/16/2020  No results found for: IRON, TIBC, FERRITIN  Lab Results  Component Value Date   CREATININE 0.79 09/22/2020   BUN 9 09/22/2020   NA 136 09/22/2020   K 4.5 09/22/2020   CL 102 09/22/2020   CO2 27 09/22/2020   Lab Results  Component Value Date   ALT 70 (H) 09/22/2020   AST 59 (H) 09/22/2020   ALKPHOS 54 09/22/2020   BILITOT 0.4 09/22/2020   Lab Results  Component Value Date   CHOL 141 09/22/2020   Lab Results  Component Value Date   HDL 48.50 09/22/2020   Lab Results  Component Value Date   LDLCALC 65 09/22/2020   Lab Results  Component Value Date   TRIG 135.0 09/22/2020   Lab Results  Component Value Date   CHOLHDL 3 09/22/2020   Lab Results  Component Value Date   HGBA1C 6.8 (A) 11/27/2020    IMPRESSION AND PLAN:  1) HTN: well controlled on 12.5 toprol xl qd and lisinopril 10 mg qd. Lytes/cr were drawn by her rheum MD yesterday.  2) HLD: tolerating crestor 20mg  qd longterm. LDL was 65 three mo ago. Plan rpt lipids 3 mo.  3) Adult ADD: stable on adderall xr 15mg  qd. I eRx'd #30 of this today.  CSC and UDS need updated at NEXT f/u visit.  4) Recurrent  MDD in remission, GAD well controlled. Cont sertraline 100mg  qd and diazepam 5mg  qd prn. No new rx's needed for these meds today. CSC and UDS need updating at NEXT f/u visit.  5) DM: per Dr. .   6) Psoriatic arthritis: still in lots of pain despite being on multiple meds. Mgmt as per rheum/Dr. .  Her transaminases have been mildly elevated and rheum  monitored these yesterday--she remains on methotrexate.  7) L knee chronic pain, osteoarth/inflamm arth-->plan is for L TKA sometime after 02/23/21.  An After Visit Summary was printed and given to the patient.  FOLLOW UP: Return in about 3 months (around 04/02/2021) for routine chronic illness f/u.  Signed:  02/25/21, MD           12/31/2020

## 2021-01-08 ENCOUNTER — Telehealth: Payer: Self-pay | Admitting: Cardiology

## 2021-01-08 ENCOUNTER — Telehealth: Payer: Self-pay

## 2021-01-08 NOTE — Telephone Encounter (Signed)
   Name: Raven [name]  DOB: Jan 10, 1956  MRN: [id number]   Primary Cardiologist: Dina Rich, MD  Chart reviewed as part of pre-operative protocol coverage. Patient was contacted 01/08/2021 in reference to pre-operative risk assessment for pending surgery as outlined below.  Raven [name] was last seen on 09/15/20 by Dr. Wyline Mood.  Since that day, Raven [name] has done well. She does not have a history of ischemic heart disease.  She is able to complete 4.0 METS without angina, primarily limited by her knee pain.   Therefore, based on ACC/AHA guidelines, the patient would be at acceptable risk for the planned procedure without further cardiovascular testing.   The patient was advised that if she develops new symptoms prior to surgery to contact our office to arrange for a follow-up visit, and she verbalized understanding.  I will route this recommendation to the requesting party via Epic fax function and remove from pre-op pool. Please call with questions.  Roe Rutherford Yocelin Vanlue, PA 01/08/2021, 2:58 PM

## 2021-01-08 NOTE — Telephone Encounter (Signed)
   [facility] Pre-operative Risk Assessment    Patient Name: Raven [name]  DOB: 1955/08/03  MRN: [id number]   HEARTCARE STAFF: - Please ensure there is not already an duplicate clearance open for this procedure. - Under Visit Info/Reason for Call, type in Other and utilize the format Clearance MM/DD/YY or Clearance TBD. Do not use dashes or single digits. - If request is for dental extraction, please clarify the # of teeth to be extracted. - If the patient is currently at the dentist's office, call Pre-Op APP to address. If the patient is not currently in the dentist office, please route to the Pre-Op pool  Request for surgical clearance:  What type of surgery is being performed? Left total knee athroplasty  When is this surgery scheduled? 03/03/21  What type of clearance is required (medical clearance vs. Pharmacy clearance to hold med vs. Both)? both  Are there any medications that need to be held prior to surgery and how long?please advise   Practice name and name of physician performing surgery? Dr Paralee Cancel   What is the office phone number? [phone]    7.   What is the office fax number? [phone]   8.   Anesthesia type (None, local, MAC, general) ? spinal   Jannet Askew 01/08/2021, 12:54 PM  _________________________________________________________________   (provider comments below)

## 2021-01-08 NOTE — Telephone Encounter (Signed)
Form faxed back to [facility] [employer]

## 2021-01-08 NOTE — Telephone Encounter (Signed)
Received preoperative risk evaluation form on 01/08/21. Placed on PCP desk for completion

## 2021-01-08 NOTE — Telephone Encounter (Signed)
Signed and put on Britt's desk. Signed:  Santiago Bumpers, MD           01/08/2021

## 2021-02-09 DIAGNOSIS — E271 Primary adrenocortical insufficiency: Secondary | ICD-10-CM

## 2021-02-09 HISTORY — DX: Primary adrenocortical insufficiency: E27.1

## 2021-02-18 NOTE — Patient Instructions (Addendum)
DUE TO COVID-19 ONLY ONE VISITOR IS ALLOWED TO COME WITH YOU AND STAY IN THE WAITING ROOM ONLY DURING PRE OP AND PROCEDURE.   **NO VISITORS ARE ALLOWED IN THE SHORT STAY AREA OR RECOVERY ROOM!!**  IF YOU WILL BE ADMITTED INTO THE HOSPITAL YOU ARE ALLOWED ONLY TWO SUPPORT PEOPLE DURING VISITATION HOURS ONLY (10AM -8PM)   The support person(s) may change daily. The support person(s) must pass our screening, gel in and out, and wear a mask at all times, including in the patient's room. Patients must also wear a mask when staff or their support person are in the room.  No visitors under the age of 69. Any visitor under the age of 12 must be accompanied by an adult.    COVID SWAB TESTING MUST BE COMPLETED ON:  02/26/21 **MUST PRESENT COMPLETED FORM AT TESTING SITE**    [address] [place] (backside of the building) You are not required to quarantine, however you are required to wear a well-fitted mask when you are out and around people not in your household.  Hand Hygiene often Do NOT share personal items Notify your provider if you are in close contact with someone who has COVID or you develop fever 100.4 or greater, new onset of sneezing, cough, sore throat, shortness of breath or body aches.       Your procedure is scheduled on: 03/03/21   Report to [facility] Main  Entrance    Report to admitting at 7:30 AM   Call this number if you have problems the morning of surgery [phone]   Do not eat food :After Midnight.   May have liquids until  7:00 AM  day of surgery  CLEAR LIQUID DIET  Foods Allowed                                                                     Foods Excluded  Water, Black Coffee and tea, regular and decaf               liquids that you cannot  Plain Jell-O in any flavor  (No red)                                    see through such as: Fruit ices (not with fruit pulp)                                            milk, soups, orange juice               Iced Popsicles (No red)                                                All solid food  Apple juices Sports drinks like Gatorade (No red) Lightly seasoned clear broth or consume(fat free) Sugar, honey syrup      The day of surgery:  Drink ONE (1) Pre-Surgery G2 by 7:00 am the morning of surgery. Drink in one sitting. Do not sip.  This drink was given to you during your hospital  pre-op appointment visit. Nothing else to drink after completing the  Pre-Surgery G2.          If you have questions, please contact your surgeon's office.     Oral Hygiene is also important to reduce your risk of infection.                                    Remember - BRUSH YOUR TEETH THE MORNING OF SURGERY WITH YOUR REGULAR TOOTHPASTE   Do NOT smoke after Midnight   Take these medicines the morning of surgery with A SIP OF WATER: Tylenol, Zyrtec, Metoprolol Tartate, Prilosec, Phenazopyridine, Prednisone, Primidone, Zoloft.   DO NOT TAKE ANY ORAL DIABETIC MEDICATIONS DAY OF YOUR SURGERY  How to Manage Your Diabetes Before and After Surgery  Why is it important to control my blood sugar before and after surgery? Improving blood sugar levels before and after surgery helps healing and can limit problems. A way of improving blood sugar control is eating a healthy diet by:  Eating less sugar and carbohydrates  Increasing activity/exercise  Talking with your doctor about reaching your blood sugar goals High blood sugars (greater than 180 mg/dL) can raise your risk of infections and slow your recovery, so you will need to focus on controlling your diabetes during the weeks before surgery. Make sure that the doctor who takes care of your diabetes knows about your planned surgery including the date and location.  How do I manage my blood sugar before surgery? Check your blood sugar at least 4 times a day, starting 2 days before surgery, to make sure that the level  is not too high or low. Check your blood sugar the morning of your surgery when you wake up and every 2 hours until you get to the Short Stay unit. If your blood sugar is less than 70 mg/dL, you will need to treat for low blood sugar: Do not take insulin. Treat a low blood sugar (less than 70 mg/dL) with  cup of clear juice (cranberry or apple), 4 glucose tablets, OR glucose gel. Recheck blood sugar in 15 minutes after treatment (to make sure it is greater than 70 mg/dL). If your blood sugar is not greater than 70 mg/dL on recheck, call [phone] for further instructions. Report your blood sugar to the short stay nurse when you get to Short Stay.  If you are admitted to the hospital after surgery: Your blood sugar will be checked by the staff and you will probably be given insulin after surgery (instead of oral diabetes medicines) to make sure you have good blood sugar levels. The goal for blood sugar control after surgery is 80-180 mg/dL.   WHAT DO I DO ABOUT MY DIABETES MEDICATION?  Do not take oral diabetes medicines (pills) the morning of surgery.  THE DAY BEFORE SURGERY, take  Metformin as prescribed.       THE MORNING OF SURGERY, do not take Metformin.   Reviewed and Endorsed by [facility] Patient Education Committee, August 2015  You may not have any metal on your body including hair pins, jewelry, and body piercing             Do not wear make-up, lotions, powders, perfumes or deodorant  Do not wear nail polish including gel and S&S, artificial/acrylic nails, or any other type of covering on natural nails including finger and toenails. If you have artificial nails, gel coating, etc. that needs to be removed by a nail salon please have this removed prior to surgery or surgery may need to be canceled/ delayed if the surgeon/ anesthesia feels like they are unable to be safely monitored.   Do not shave  48 hours prior to surgery.    Do not bring valuables to the  hospital.  IS NOT             RESPONSIBLE   FOR VALUABLES.   Bring small overnight bag day of surgery.   Special Instructions: Bring a copy of your healthcare power of attorney and living will documents         the day of surgery if you haven't scanned them in before.   Please read over the following fact sheets you were given: IF YOU HAVE QUESTIONS ABOUT YOUR PRE OP INSTRUCTIONS PLEASE CALL [phone]- [facility] - Preparing for Surgery Before surgery, you can play an important role.  Because skin is not sterile, your skin needs to be as free of germs as possible.  You can reduce the number of germs on your skin by washing with CHG (chlorahexidine gluconate) soap before surgery.  CHG is an antiseptic cleaner which kills germs and bonds with the skin to continue killing germs even after washing. Please DO NOT use if you have an allergy to CHG or antibacterial soaps.  If your skin becomes reddened/irritated stop using the CHG and inform your nurse when you arrive at Short Stay. Do not shave (including legs and underarms) for at least 48 hours prior to the first CHG shower.  You may shave your face/neck.  Please follow these instructions carefully:  1.  Shower with CHG Soap the night before surgery and the  morning of surgery.  2.  If you choose to wash your hair, wash your hair first as usual with your normal  shampoo.  3.  After you shampoo, rinse your hair and body thoroughly to remove the shampoo.                             4.  Use CHG as you would any other liquid soap.  You can apply chg directly to the skin and wash.  Gently with a scrungie or clean washcloth.  5.  Apply the CHG Soap to your body ONLY FROM THE NECK DOWN.   Do   not use on face/ open                           Wound or open sores. Avoid contact with eyes, ears mouth and   genitals (private parts).                       Wash face,  Genitals (private parts) with your normal soap.             6.  Wash  thoroughly, paying special attention to the area where your    surgery  will  be performed.  7.  Thoroughly rinse your body with warm water from the neck down.  8.  DO NOT shower/wash with your normal soap after using and rinsing off the CHG Soap.                9.  Pat yourself dry with a clean towel.            10.  Wear clean pajamas.            11.  Place clean sheets on your bed the night of your first shower and do not  sleep with pets. Day of Surgery : Do not apply any lotions/deodorants the morning of surgery.  Please wear clean clothes to the hospital/surgery center.  FAILURE TO FOLLOW THESE INSTRUCTIONS MAY RESULT IN THE CANCELLATION OF YOUR SURGERY  PATIENT SIGNATURE_________________________________  NURSE SIGNATURE__________________________________  ________________________________________________________________________     Raven [name]  An incentive spirometer is a tool that can help keep your lungs clear and active. This tool measures how well you are filling your lungs with each breath. Taking long deep breaths may help reverse or decrease the chance of developing breathing (pulmonary) problems (especially infection) following: A long period of time when you are unable to move or be active. BEFORE THE PROCEDURE  If the spirometer includes an indicator to show your best effort, your nurse or respiratory therapist will set it to a desired goal. If possible, sit up straight or lean slightly forward. Try not to slouch. Hold the incentive spirometer in an upright position. INSTRUCTIONS FOR USE  Sit on the edge of your bed if possible, or sit up as far as you can in bed or on a chair. Hold the incentive spirometer in an upright position. Breathe out normally. Place the mouthpiece in your mouth and seal your lips tightly around it. Breathe in slowly and as deeply as possible, raising the piston or the ball toward the top of the column. Hold your breath for 3-5 seconds  or for as long as possible. Allow the piston or ball to fall to the bottom of the column. Remove the mouthpiece from your mouth and breathe out normally. Rest for a few seconds and repeat Steps 1 through 7 at least 10 times every 1-2 hours when you are awake. Take your time and take a few normal breaths between deep breaths. The spirometer may include an indicator to show your best effort. Use the indicator as a goal to work toward during each repetition. After each set of 10 deep breaths, practice coughing to be sure your lungs are clear. If you have an incision (the cut made at the time of surgery), support your incision when coughing by placing a pillow or rolled up towels firmly against it. Once you are able to get out of bed, walk around indoors and cough well. You may stop using the incentive spirometer when instructed by your caregiver.  RISKS AND COMPLICATIONS Take your time so you do not get dizzy or light-headed. If you are in pain, you may need to take or ask for pain medication before doing incentive spirometry. It is harder to take a deep breath if you are having pain. AFTER USE Rest and breathe slowly and easily. It can be helpful to keep track of a log of your progress. Your caregiver can provide you with a simple table to help with this. If you are using the spirometer at home, follow these instructions: SEEK MEDICAL CARE IF:  You  are having difficultly using the spirometer. You have trouble using the spirometer as often as instructed. Your pain medication is not giving enough relief while using the spirometer. You develop fever of 100.5 F (38.1 C) or higher. SEEK IMMEDIATE MEDICAL CARE IF:  You cough up bloody sputum that had not been present before. You develop fever of 102 F (38.9 C) or greater. You develop worsening pain at or near the incision site. MAKE SURE YOU:  Understand these instructions. Will watch your condition. Will get help right away if you are not doing  well or get worse. Document Released: 11/08/2006 Document Revised: 09/20/2011 Document Reviewed: 01/09/2007 [employer] Patient Information 2014 [employer], [place].   ________________________________________________________________________    WHAT IS A BLOOD TRANSFUSION? Blood Transfusion Information  A transfusion is the replacement of blood or some of its parts. Blood is made up of multiple cells which provide different functions. Red blood cells carry oxygen and are used for blood loss replacement. White blood cells fight against infection. Platelets control bleeding. Plasma helps clot blood. Other blood products are available for specialized needs, such as hemophilia or other clotting disorders. BEFORE THE TRANSFUSION  Who gives blood for transfusions?  Healthy volunteers who are fully evaluated to make sure their blood is safe. This is blood bank blood. Transfusion therapy is the safest it has ever been in the practice of medicine. Before blood is taken from a donor, a complete history is taken to make sure that person has no history of diseases nor engages in risky social behavior (examples are intravenous drug use or sexual activity with multiple partners). The donor's travel history is screened to minimize risk of transmitting infections, such as malaria. The donated blood is tested for signs of infectious diseases, such as HIV and hepatitis. The blood is then tested to be sure it is compatible with you in order to minimize the chance of a transfusion reaction. If you or a relative donates blood, this is often done in anticipation of surgery and is not appropriate for emergency situations. It takes many days to process the donated blood. RISKS AND COMPLICATIONS Although transfusion therapy is very safe and saves many lives, the main dangers of transfusion include:  Getting an infectious disease. Developing a transfusion reaction. This is an allergic reaction to something in the blood you  were given. Every precaution is taken to prevent this. The decision to have a blood transfusion has been considered carefully by your caregiver before blood is given. Blood is not given unless the benefits outweigh the risks. AFTER THE TRANSFUSION Right after receiving a blood transfusion, you will usually feel much better and more energetic. This is especially true if your red blood cells have gotten low (anemic). The transfusion raises the level of the red blood cells which carry oxygen, and this usually causes an energy increase. The nurse administering the transfusion will monitor you carefully for complications. HOME CARE INSTRUCTIONS  No special instructions are needed after a transfusion. You may find your energy is better. Speak with your caregiver about any limitations on activity for underlying diseases you may have. SEEK MEDICAL CARE IF:  Your condition is not improving after your transfusion. You develop redness or irritation at the intravenous (IV) site. SEEK IMMEDIATE MEDICAL CARE IF:  Any of the following symptoms occur over the next 12 hours: Shaking chills. You have a temperature by mouth above 102 F (38.9 C), not controlled by medicine. Chest, back, or muscle pain. People around you feel you are  not acting correctly or are confused. Shortness of breath or difficulty breathing. Dizziness and fainting. You get a rash or develop hives. You have a decrease in urine output. Your urine turns a dark color or changes to pink, red, or brown. Any of the following symptoms occur over the next 10 days: You have a temperature by mouth above 102 F (38.9 C), not controlled by medicine. Shortness of breath. Weakness after normal activity. The white part of the eye turns yellow (jaundice). You have a decrease in the amount of urine or are urinating less often. Your urine turns a dark color or changes to pink, red, or brown. Document Released: 06/25/2000 Document Revised: 09/20/2011  Document Reviewed: 02/12/2008 [facility] Patient Information 2014 [place], [place].  _______________________________________________________________________

## 2021-02-18 NOTE — Progress Notes (Addendum)
COVID Vaccine Completed: yes x4 Date COVID Vaccine completed: 09/21/19, 10/17/19 Has received booster: 04/17/20, 09/22/20 COVID vaccine manufacturer: Pfizer      Date of COVID positive in last 90 days: No  PCP - Nicoletta Ba, MD Cardiologist - Dina Rich, MD  Cardiac clearance note dated 01/08/21 by Micah Flesher  Chest x-ray - N/a EKG - 03/21/20 Epic Stress Test - 11/01/17 Epic ECHO - N/a Cardiac Cath - N/a Pacemaker/ICD device last checked: N/a Spinal Cord Stimulator: N/a  Sleep Study - N/a CPAP -   Fasting Blood Sugar - 100-120 Checks Blood Sugar __1__ times a day  Blood Thinner Instructions: N/a Aspirin Instructions: Last Dose:  Activity level: Can perform activities of daily living without stopping and without symptoms of chest pain or shortness of breath. Difficulty with stairs due to arthritis. She is able to get up them very slowly if she has to.    Anesthesia review: HTN, DM, PSVT, A1c at PAT 8.3  Patient denies shortness of breath, fever, cough and chest pain at PAT appointment   Patient verbalized understanding of instructions that were given to them at the PAT appointment. Patient was also instructed that they will need to review over the PAT instructions again at home before surgery.

## 2021-02-19 ENCOUNTER — Other Ambulatory Visit: Payer: Self-pay

## 2021-02-19 ENCOUNTER — Encounter (HOSPITAL_COMMUNITY): Payer: Self-pay

## 2021-02-19 ENCOUNTER — Encounter (HOSPITAL_COMMUNITY)
Admission: RE | Admit: 2021-02-19 | Discharge: 2021-02-19 | Disposition: A | Discharge status: Home / Self Care | Payer: 59 | Source: Ambulatory Visit | Attending: Orthopedic Surgery | Admitting: Orthopedic Surgery

## 2021-02-19 DIAGNOSIS — Z01812 Encounter for preprocedural laboratory examination: Secondary | ICD-10-CM | POA: Insufficient documentation

## 2021-02-19 LAB — CBC
HCT: 36.6 % (ref 36.0–46.0)
Hemoglobin: 11.8 g/dL — ABNORMAL LOW (ref 12.0–15.0)
MCH: 26.5 pg (ref 26.0–34.0)
MCHC: 32.2 g/dL (ref 30.0–36.0)
MCV: 82.2 fL (ref 80.0–100.0)
Platelets: 269 10*3/uL (ref 150–400)
RBC: 4.45 MIL/uL (ref 3.87–5.11)
RDW: 14.7 % (ref 11.5–15.5)
WBC: 8.2 10*3/uL (ref 4.0–10.5)
nRBC: 0 % (ref 0.0–0.2)

## 2021-02-19 LAB — SURGICAL PCR SCREEN
MRSA, PCR: NEGATIVE
Staphylococcus aureus: NEGATIVE

## 2021-02-19 LAB — COMPREHENSIVE METABOLIC PANEL
ALT: 46 U/L — ABNORMAL HIGH (ref 0–44)
AST: 41 U/L (ref 15–41)
Albumin: 3.6 g/dL (ref 3.5–5.0)
Alkaline Phosphatase: 57 U/L (ref 38–126)
Anion gap: 12 (ref 5–15)
BUN: 13 mg/dL (ref 8–23)
CO2: 23 mmol/L (ref 22–32)
Calcium: 8.6 mg/dL — ABNORMAL LOW (ref 8.9–10.3)
Chloride: 99 mmol/L (ref 98–111)
Creatinine, Ser: 0.75 mg/dL (ref 0.44–1.00)
GFR, Estimated: >60 mL/min (ref >60)
Glucose, Bld: 223 mg/dL — ABNORMAL HIGH (ref 70–99)
Potassium: 3.9 mmol/L (ref 3.5–5.1)
Sodium: 134 mmol/L — ABNORMAL LOW (ref 135–145)
Total Bilirubin: 0.8 mg/dL (ref 0.3–1.2)
Total Protein: 6.7 g/dL (ref 6.5–8.1)

## 2021-02-19 LAB — HEMOGLOBIN A1C
Hgb A1c MFr Bld: 8.3 % — ABNORMAL HIGH (ref 4.8–5.6)
Mean Plasma Glucose: 191.51 mg/dL

## 2021-02-19 LAB — TYPE AND SCREEN
ABO/RH(D): A POS
Antibody Screen: NEGATIVE

## 2021-02-19 LAB — GLUCOSE, CAPILLARY: Glucose-Capillary: 222 mg/dL — ABNORMAL HIGH (ref 70–99)

## 2021-02-19 NOTE — Progress Notes (Signed)
PAT A1C 8.3. Routed to Dr. Charlann Boxer.

## 2021-02-20 ENCOUNTER — Telehealth: Payer: Self-pay

## 2021-02-20 NOTE — Telephone Encounter (Signed)
Inbound call from surgeon's office advising pt came in for pre surgery labs and pt's A1C is now 8.3. This will result in her surgery being cancelled until A1C is lower then 5.9. Requesting pt be seen to help get her A1C back under control as soon as possible.

## 2021-02-23 NOTE — Telephone Encounter (Signed)
T, Was she able to restart Ozempic?  I can see her on Thursday at 9:20 AM. C

## 2021-02-24 NOTE — Telephone Encounter (Signed)
Called and pt is available for 8/18 at 9:20. Pt never stopped taking Ozempic.

## 2021-02-25 ENCOUNTER — Encounter: Payer: Self-pay | Admitting: Family Medicine

## 2021-02-25 ENCOUNTER — Other Ambulatory Visit: Payer: Self-pay

## 2021-02-25 ENCOUNTER — Ambulatory Visit: Payer: 59 | Admitting: Family Medicine

## 2021-02-25 VITALS — BP 100/62 | HR 105 | Temp 98.2°F | Resp 16 | Ht 68.5 in | Wt 155.2 lb

## 2021-02-25 DIAGNOSIS — E119 Type 2 diabetes mellitus without complications: Secondary | ICD-10-CM

## 2021-02-25 DIAGNOSIS — R110 Nausea: Secondary | ICD-10-CM | POA: Diagnosis not present

## 2021-02-25 MED ORDER — AMPHETAMINE-DEXTROAMPHET ER 15 MG PO CP24: ORAL_CAPSULE | ORAL | 0 refills | Status: DC

## 2021-02-25 MED ORDER — PROMETHAZINE HCL 12.5 MG PO TABS: ORAL_TABLET | ORAL | 2 refills | Status: DC

## 2021-02-25 NOTE — Telephone Encounter (Signed)
OK.  I have 1 slot that pm and the other one is a 40 min appt- pump.

## 2021-02-25 NOTE — Progress Notes (Signed)
OFFICE VISIT  02/25/2021  CC:  Chief Complaint  Patient presents with   Follow-up    RCI, pt is not fasting    HPI:    Patient is a 65 y.o. Caucasian female who presents for recent abnormal a1c, chronic nausea and poor appetite d/c chronic pain. A/P as of last visit 2 mo ago: "1) HTN: well controlled on 12.5 toprol xl qd and lisinopril 10 mg qd. Lytes/cr were drawn by her rheum MD yesterday.   2) HLD: tolerating crestor 20mg  qd longterm. LDL was 65 three mo ago. Plan rpt lipids 3 mo.   3) Adult ADD: stable on adderall xr 15mg  qd. I eRx'd #30 of this today.  CSC and UDS need updated at NEXT f/u visit.   4) Recurrent MDD in remission, GAD well controlled. Cont sertraline 100mg  qd and diazepam 5mg  qd prn. No new rx's needed for these meds today. CSC and UDS need updating at NEXT f/u visit.   5) DM: per Dr. .   6) Psoriatic arthritis: still in lots of pain despite being on multiple meds. Mgmt as per rheum/Dr. .  Her transaminases have been mildly elevated and rheum monitored these yesterday--she remains on methotrexate.   7) L knee chronic pain, osteoarth/inflamm arth-->plan is for L TKA sometime after 02/23/21."  INTERIM HX: Her upcoming L TKA was cancelled b/c her a1c was 8.3%.   She initially had some trouble getting a f/u with her endo MD, Dr. , but then was able to get one 2d from now. Pt states she has been very stressed and in a lot of pain and perhaps this (plus a couple steroid dose packs) the explanation for her a1c rise.  However, when checking fasting and 2H PP glucoses at home she was getting normal numbers and since finding out her a1c was up she started checking glucose 4x/day and it is ranging 125-175. Metformin 500 bid and ozempic 0.5mg  q week.  She is not eating much last few months b/c in a lot of pain constantly and she is even nauseated d/t the pain level. She can't tolerate opioids, tramadol doesn't help. Taking tylenol  currently. No fever or vomiting or abd pain.  PMP AWARE reviewed today: most recent rx for adderall was filled 12/31/20, # 30, rx by me. No red flags.   Past Medical History:  Diagnosis Date   Acute medial meniscus tear of right knee    Anemia of chronic disease    Mild, Hb stable consistently   Anxiety and depression    Chest pain 10/2017   +Cardiac CT.  Myocardial perfusion imaging NORMAL, EF>65%   Diabetes mellitus 1995   Managed by Dr. 02/25/21 (Endo)   GERD (gastroesophageal reflux disease) per pt watches diet and take tums as needed   H/O hiatal hernia    slightly larger ("moderate" size) on CT angio done to r/o PE 10/2017.   Hyperlipidemia    Hypertension    Interstitial cystitis    Lichen sclerosus of female genitalia    Migraine    Osteoarthritis, multiple sites    Palpitations 10/2017   3 wk event monitor: symptoms correlate with sinus rhythm with PACs, at times runs of PACs up to 5 beats.   Pseudogout of knee, left    colchicine helpful   Psoriasis    Seasonal allergies    Seropositive rheumatoid arthritis (HCC)    Hands, knees, jaw, elbow: responding to Simponi as of 09/29/15 rheum f/u.  Waning effectiveness on 03/2016 f/u  so pt switched to Orencia injections.  Doing well on chronic low-dose prednisone therapy+ methotrexate +orencia as of 01/2019, 08/2019, 11/2019    Past Surgical History:  Procedure Laterality Date   ABDOMINAL HYSTERECTOMY  1991   for fibroids; ovaries still in   BREAST BIOPSY  2001; 02/12/16   fibrocystic breast disease in 2001 and again in 02/2016 (+scar from prev bx site w/calcifications).   CARDIOVASCULAR STRESS TEST  11/01/2017   Myocard perf imaging: NORMAL (EF >60-65%)   COLONOSCOPY  09/12/2013; 02/25/20   Normal 2015 and 2021: Recall 5 yrs ([facility], Dr. Evette Cristal) due to FH of colon polyps.   CYSTOSCOPY  2004   for chronic UTI   DEXA  10/2016   Bone density normal (T-score 0.0)   ESOPHAGOGASTRODUODENOSCOPY  04/19/2011   Nl except antral  gastritis: bx = mild chron gastritis (H pyloria neg)    ESOPHAGOGASTRODUODENOSCOPY (EGD) WITH PROPOFOL N/A 03/27/2020   Esoph stenosis-->dilated.  Mod size hiatal hernia ([facility]). Procedure: ESOPHAGOGASTRODUODENOSCOPY (EGD) WITH PROPOFOL ;  Surgeon: Graylin Shiver, MD;  Location: [facility];  Service: Endoscopy;  Laterality: N/A;   EVENT MONITOR  10/2017   3 wk-->Symptoms correlate with sinus rhythm with PACs, at times runs of PACs up to 5 beats.   GALLBLADDER SURGERY  2019   intraarticular steroid injection  2013   3 R knee & L X 1; Dr Netta Corrigan   KNEE ARTHROCENTESIS  2013   R knee x 3 & L X 1   KNEE ARTHROSCOPY  10/21/2011   Procedure: ARTHROSCOPY KNEE;  Surgeon: Jacki Cones, MD;  Location: [facility];  Service: Orthopedics;  Laterality: Right;  WITH MEDIAL and lateral shaving of femoral chondyl  with microfracture technique of lateral and medial femoral chondyl suprapatellar synovectomy   LAPAROSCOPIC CHOLECYSTECTOMY  01/2018   SAVORY DILATION N/A 03/27/2020   Procedure: SAVORY DILATION;  Surgeon: Graylin Shiver, MD;  Location: [facility];  Service: Endoscopy;  Laterality: N/A;   TOTAL KNEE ARTHROPLASTY  04/12/2012   RIGHT  Procedure: TOTAL KNEE ARTHROPLASTY;  Surgeon: Jacki Cones, MD;  Location: [facility];  Service: Orthopedics;  Laterality: Right;  Right Total Knee Arthroplasty   TUBAL LIGATION  1981    Outpatient Medications Prior to Visit  Medication Sig Dispense Refill   Abatacept (ORENCIA) 125 MG/ML SOSY Inject 125 mg into the skin every Monday.     acetaminophen (TYLENOL) 500 MG tablet Take 1,000 mg by mouth every 6 (six) hours as needed for moderate pain.     amphetamine-dextroamphetamine (ADDERALL XR) 15 MG 24 hr capsule TAKE ONE CAPSULE BY MOUTH EVERY MORNING 30 capsule 0   cetirizine (ZYRTEC) 10 MG tablet Take 10 mg by mouth daily.     Cholecalciferol (VITAMIN D3 PO) Take 3 capsules by mouth daily.     Colchicine 0.6 MG CAPS Take 1.2 mg by mouth at  bedtime.     diazepam (VALIUM) 5 MG tablet TAKE ONE TABLET BY MOUTH EVERY TWELVE HOURS AS NEEDED FOR MIGRAINE HEADACHES (Patient taking differently: Take 2.5 mg by mouth at bedtime.) 60 tablet 5   diclofenac Sodium (VOLTAREN) 1 % GEL Apply 1 application topically 2 (two) times daily. Apply to left knee     folic acid (FOLVITE) 1 MG tablet Take 1 mg by mouth at bedtime.     ibuprofen (ADVIL) 200 MG tablet Take 400 mg by mouth every 6 (six) hours as needed for moderate pain.     leflunomide (ARAVA)  20 MG tablet Take 20 mg by mouth daily.     lisinopril (ZESTRIL) 20 MG tablet Take 0.5 tablets (10 mg total) by mouth daily. 45 tablet 3   metFORMIN (GLUCOPHAGE-XR) 500 MG 24 hr tablet TAKE ONE TABLET BY MOUTH TWICE DAILY WITH A MEAL 180 tablet 3   methotrexate 50 MG/2ML injection Inject 22.5 mg into the muscle every Monday.     metoprolol tartrate (LOPRESSOR) 25 MG tablet Take 0.5 tablets (12.5 mg total) by mouth 2 (two) times daily. 90 tablet 3   omeprazole (PRILOSEC) 40 MG capsule TAKE ONE CAPSULE BY MOUTH DAILY 90 capsule 3   OZEMPIC, 0.25 OR 0.5 MG/DOSE, 2 MG/1.5ML SOPN Inject 0.5 mg into the skin once a week. (Patient taking differently: Inject 0.5 mg into the skin every Monday.) 3 mL 3   phenazopyridine (PYRIDIUM) 95 MG tablet Take 190 mg by mouth 2 (two) times daily as needed for pain.     Polyvinyl Alcohol-Povidone (REFRESH OP) Place 1 drop into both eyes 2 (two) times daily.     predniSONE (DELTASONE) 5 MG tablet Take 5 mg by mouth daily.     primidone (MYSOLINE) 50 MG tablet Take 50-100 mg by mouth See admin instructions. Take 1 tablet (50 mg) by mouth in the morning & take 2 tablets (100 mg) by mouth in the evening.     rosuvastatin (CRESTOR) 20 MG tablet Take 1 tablet (20 mg total) by mouth daily. (Patient taking differently: Take 20 mg by mouth at bedtime.) 90 tablet 3   sertraline (ZOLOFT) 100 MG tablet TAKE ONE TABLET BY MOUTH EVERY DAY 90 tablet 3   No facility-administered medications  prior to visit.    Allergies  Allergen Reactions   Steri-Strip Compound Benzoin [Benzoin Compound] Anaphylaxis    Blisters and itching   Penicillins Rash   Pravastatin     Myalgias   Simvastatin     Myalgias   Hydrocodone-Acetaminophen Nausea And Vomiting    ROS As per HPI  PE: Vitals with BMI 02/25/2021 02/19/2021 12/31/2020  Height 5' 8.5"  -  Weight 155 lbs 3 oz 162 lbs 167 lbs 6 oz  BMI 23.25 24.64 -  Systolic 100 125 161  Diastolic 62 82 79  Pulse 105 110 76     Gen: Alert, well appearing.  Patient is oriented to person, place, time, and situation. AFFECT: pleasant, lucid thought and speech. No further exam today.  LABS:  Lab Results  Component Value Date   TSH 1.37 11/09/2019   Lab Results  Component Value Date   WBC 8.2 02/19/2021   HGB 11.8 (L) 02/19/2021   HCT 36.6 02/19/2021   MCV 82.2 02/19/2021   PLT 269 02/19/2021   Lab Results  Component Value Date   CREATININE 0.75 02/19/2021   BUN 13 02/19/2021   NA 134 (L) 02/19/2021   K 3.9 02/19/2021   CL 99 02/19/2021   CO2 23 02/19/2021   Lab Results  Component Value Date   ALT 46 (H) 02/19/2021   AST 41 02/19/2021   ALKPHOS 57 02/19/2021   BILITOT 0.8 02/19/2021   Lab Results  Component Value Date   CHOL 141 09/22/2020   Lab Results  Component Value Date   HDL 48.50 09/22/2020   Lab Results  Component Value Date   LDLCALC 65 09/22/2020   Lab Results  Component Value Date   TRIG 135.0 09/22/2020   Lab Results  Component Value Date   CHOLHDL 3 09/22/2020  Lab Results  Component Value Date   HGBA1C 8.3 (H) 02/19/2021   IMPRESSION AND PLAN:  1) DM 2, historically well controlled on metformin and ozempic but last a1c 02/19/21 was 8.3%. Home glucoses don't really correlate with this, though. She has f/u with Dr. Elvera Lennox in 2d.  I did not change anything today or repeat any labs.  2) Nausea, poor appetite: d/t chronic severe pain. Rx'd phenergan 12.5mg , 1-2 q6h prn  today. Therapeutic expectations and side effect profile of medication discussed today.  Patient's questions answered.   An After Visit Summary was printed and given to the patient.  FOLLOW UP: No follow-ups on file.  Signed:  Santiago Bumpers, MD           02/25/2021

## 2021-02-25 NOTE — Telephone Encounter (Signed)
Pt has been scheduled for Friday 8/19 at 2:20.

## 2021-02-27 ENCOUNTER — Other Ambulatory Visit: Payer: Self-pay

## 2021-02-27 ENCOUNTER — Ambulatory Visit: Payer: 59 | Admitting: Internal Medicine

## 2021-02-27 ENCOUNTER — Encounter: Payer: Self-pay | Admitting: Internal Medicine

## 2021-02-27 VITALS — BP 116/72 | HR 106 | Ht 68.5 in | Wt 154.4 lb

## 2021-02-27 DIAGNOSIS — E119 Type 2 diabetes mellitus without complications: Secondary | ICD-10-CM

## 2021-02-27 DIAGNOSIS — E663 Overweight: Secondary | ICD-10-CM | POA: Diagnosis not present

## 2021-02-27 DIAGNOSIS — Z794 Long term (current) use of insulin: Secondary | ICD-10-CM | POA: Diagnosis not present

## 2021-02-27 DIAGNOSIS — E785 Hyperlipidemia, unspecified: Secondary | ICD-10-CM

## 2021-02-27 DIAGNOSIS — E2749 Other adrenocortical insufficiency: Secondary | ICD-10-CM | POA: Diagnosis not present

## 2021-02-27 LAB — POCT GLYCOSYLATED HEMOGLOBIN (HGB A1C): Hemoglobin A1C: 7.7 % — AB (ref 4.0–5.6)

## 2021-02-27 MED ORDER — PREDNISONE 5 MG PO TABS: 5.0000 mg | ORAL_TABLET | Freq: Every day | ORAL | 3 refills | Status: DC

## 2021-02-27 MED ORDER — METFORMIN HCL ER 500 MG PO TB24: 500.0000 mg | ORAL_TABLET | Freq: Two times a day (BID) | ORAL | 3 refills | Status: DC

## 2021-02-27 MED ORDER — OZEMPIC (0.25 OR 0.5 MG/DOSE) 2 MG/1.5ML ~~LOC~~ SOPN: 0.5000 mg | PEN_INJECTOR | SUBCUTANEOUS | 3 refills | Status: DC

## 2021-02-27 NOTE — Progress Notes (Signed)
Patient ID: Raven [name], female   DOB: 1956/03/23, 65 y.o.   MRN: [id number]  This visit occurred during the SARS-CoV-2 public health emergency.  Safety protocols were in place, including screening questions prior to the visit, additional usage of staff PPE, and extensive cleaning of exam room while observing appropriate contact time as indicated for disinfecting solutions.   HPI: Raven [name] is a 65 y.o.-year-old female, returning for f/u for DM2, dx ~1995, insulin-dependent since 11/2013 - now insulin-independent, controlled, without long-term complications. Last visit 4 months ago.  Interim history: She started Adderall before last visit and she did much better with remembering to take her medications. She has to have TKR but the surgery was cancelled 2/2 high HbA1c (needs to be <7.5%).  Before HbA1c she has been on 2 longer prednisone tapers for her arthritis.  She finished these and actually came off prednisone completely in 02/20/2021 - she even stopped the 5 mg Prednisone that she has been on for years!!! At this visit, she is feeling very poorly, with nausea, weakness (she is in a wheelchair), decreased appetite, weight loss. "I feel like I'm dying".  She was laid 30 minutes for the appointment. No increased urination, blurry vision, chest pain. She does mention that her sugars improved after stopping prednisone.  No lows.  Reviewed HbA1c levels: Lab Results  Component Value Date   HGBA1C 8.3 (H) 02/19/2021   HGBA1C 6.8 (A) 11/27/2020   HGBA1C 6.2 (A) 05/28/2020   She has RA >> was on Simponi (changed from Remicade) and MTX >> started Orencia and stays on MTX. On Plaquenil.  Prev. on prednisone 5 mg daily - now off.  Pt is on a regimen of: - Metformin ER 1500 >> 500 mg 2x a day - Ozempic 0.5 mg weekly - started 12/2018 -she had initially nausea, now resolved  Previously on Levemir 14 >> 10 >> 4-6 units at bedtime >> stopped 07/2019 She tried Glimepiride in the past >>  fluctuating blood sugar. We stopped glipizide and Januvia only started Ozempic 12/2018. She had a CGM in the past but could not afford it anymore.  She checks sugars 2-3x a day per review of her log: - am: 125 >> 101-114 >> 104-120 >> 101-138 >> 122-172 - 2h after b'fast:  120-130 >> n/c >> 120-140 >> n/c >> 143, 160 - before lunch: n/c >> 110-120 >> n/c - 2h after lunch: n/c >> 140-154 >> n/c >> 153 - before dinner: 130-150 >> 150 >> n/c >> 124, 151 - 2h after dinner: 150 >> 134-163 >> 168-188 >> 166, 186, 220 (steroids) - bedtime: 110-120 >> n/c   - nighttime: n/c >> 100-150 >> n/c >> 151 Lowest sugar was 52 (x2 -Glipizide) ... >> 104 >> 101 >> 122;  she has hypoglycemia awareness in the 70s. Highest sugar was 324 ...180 >> 163 >> 250 (steroid) >> 220  Meter: ReliOn.  Pt's meals are: - Breakfast: scrambled eggs + bacon + tomatoes - Lunch: meat + 2 veggies + no bread unless burger - Dinner: meat + 2 veggies - Snacks: 2: nuts; cheese;   -No CKD, last BUN/creatinine:  Lab Results  Component Value Date   BUN 13 02/19/2021   CREATININE 0.75 02/19/2021  On lisinopril 10.  -+ HL; last set of lipids: Lab Results  Component Value Date   CHOL 141 09/22/2020   HDL 48.50 09/22/2020   LDLCALC 65 09/22/2020   LDLDIRECT 142.3 03/06/2008   TRIG 135.0 09/22/2020  CHOLHDL 3 09/22/2020  She was taken off statins in the past due to transaminitis in 10/2013.  Atorvastatin caused muscle cramps.  Currently on Crestor 10 and fish oil.  - last eye exam 04/2019: No DR.  She had cataract sx's 05/2018. Dr. Charise Killian.  - Denies numbness and tingling in her feet.  Started on Omeprazole for choking >> resolved.  She had an esophageal dilation 03/2020. She has steroid injections in knees - had 1 TKR and will need to have another one. She was dx'ed with PSVT after a syncopal episode at church at the beginning of 2022. On Metoprolol now.  ROS: + See HPI + Tremors, + joint pain  I reviewed pt's  medications, allergies, PMH, social hx, family hx, and changes were documented in the history of present illness. Otherwise, unchanged from my initial visit note.  Past Medical History:  Diagnosis Date   Acute medial meniscus tear of right knee    Anemia of chronic disease    Mild, Hb stable consistently   Anxiety and depression    Chest pain 10/2017   +Cardiac CT.  Myocardial perfusion imaging NORMAL, EF>65%   Diabetes mellitus 1995   Managed by Dr. Elvera Lennox (Endo)   GERD (gastroesophageal reflux disease) per pt watches diet and take tums as needed   H/O hiatal hernia    slightly larger ("moderate" size) on CT angio done to r/o PE 10/2017.   Hyperlipidemia    Hypertension    Interstitial cystitis    Lichen sclerosus of female genitalia    Migraine    Osteoarthritis, multiple sites    Palpitations 10/2017   3 wk event monitor: symptoms correlate with sinus rhythm with PACs, at times runs of PACs up to 5 beats.   Pseudogout of knee, left    colchicine helpful   Psoriasis    Seasonal allergies    Seropositive rheumatoid arthritis (HCC)    Hands, knees, jaw, elbow: responding to Simponi as of 09/29/15 rheum f/u.  Waning effectiveness on 03/2016 f/u so pt switched to Orencia injections.  Doing well on chronic low-dose prednisone therapy+ methotrexate +orencia as of 01/2019, 08/2019, 11/2019    Past Surgical History:  Procedure Laterality Date   ABDOMINAL HYSTERECTOMY  1991   for fibroids; ovaries still in   BREAST BIOPSY  2001; 02/12/16   fibrocystic breast disease in 2001 and again in 02/2016 (+scar from prev bx site w/calcifications).   CARDIOVASCULAR STRESS TEST  11/01/2017   Myocard perf imaging: NORMAL (EF >60-65%)   COLONOSCOPY  09/12/2013; 02/25/20   Normal 2015 and 2021: Recall 5 yrs ([facility], Dr. Evette Cristal) due to FH of colon polyps.   CYSTOSCOPY  2004   for chronic UTI   DEXA  10/2016   Bone density normal (T-score 0.0)   ESOPHAGOGASTRODUODENOSCOPY  04/19/2011   Nl except antral  gastritis: bx = mild chron gastritis (H pyloria neg)    ESOPHAGOGASTRODUODENOSCOPY (EGD) WITH PROPOFOL N/A 03/27/2020   Esoph stenosis-->dilated.  Mod size hiatal hernia ([facility]). Procedure: ESOPHAGOGASTRODUODENOSCOPY (EGD) WITH PROPOFOL ;  Surgeon: Graylin Shiver, MD;  Location: [facility];  Service: Endoscopy;  Laterality: N/A;   EVENT MONITOR  10/2017   3 wk-->Symptoms correlate with sinus rhythm with PACs, at times runs of PACs up to 5 beats.   GALLBLADDER SURGERY  2019   intraarticular steroid injection  2013   3 R knee & L X 1; Dr Netta Corrigan   KNEE ARTHROCENTESIS  2013   R knee x  3 & L X 1   KNEE ARTHROSCOPY  10/21/2011   Procedure: ARTHROSCOPY KNEE;  Surgeon: Jacki Cones, MD;  Location: [facility];  Service: Orthopedics;  Laterality: Right;  WITH MEDIAL and lateral shaving of femoral chondyl  with microfracture technique of lateral and medial femoral chondyl suprapatellar synovectomy   LAPAROSCOPIC CHOLECYSTECTOMY  01/2018   SAVORY DILATION N/A 03/27/2020   Procedure: SAVORY DILATION;  Surgeon: Graylin Shiver, MD;  Location: [facility];  Service: Endoscopy;  Laterality: N/A;   TOTAL KNEE ARTHROPLASTY  04/12/2012   RIGHT  Procedure: TOTAL KNEE ARTHROPLASTY;  Surgeon: Jacki Cones, MD;  Location: [facility];  Service: Orthopedics;  Laterality: Right;  Right Total Knee Arthroplasty   TUBAL LIGATION  1981    Social History   Socioeconomic History   Marital status: Married    Spouse name: Not on file   Number of children: Not on file   Years of education: Not on file   Highest education level: Not on file  Occupational History   Not on file  Tobacco Use   Smoking status: Never   Smokeless tobacco: Never  Vaping Use   Vaping Use: Never used  Substance and Sexual Activity   Alcohol use: No   Drug use: No   Sexual activity: Not on file  Other Topics Concern   Not on file  Social History Narrative   Married, 3 children (2 in [place], 1 in [place]).   4 grandchildren.   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)   No tob/alc/drugs.   2 cups of coffee/day x 3 months   Regular exercise- no-due to arthritis.   Religion affecting care, "it allows stress management:"   Social Determinants of Health   Financial Resource Strain: Not on file  Food Insecurity: Not on file  Transportation Needs: Not on file  Physical Activity: Not on file  Stress: Not on file  Social Connections: Not on file  Intimate Partner Violence: Not on file    Current Outpatient Medications on File Prior to Visit  Medication Sig Dispense Refill   Abatacept (ORENCIA) 125 MG/ML SOSY Inject 125 mg into the skin every Monday.     acetaminophen (TYLENOL) 500 MG tablet Take 1,000 mg by mouth every 6 (six) hours as needed for moderate pain.     amphetamine-dextroamphetamine (ADDERALL XR) 15 MG 24 hr capsule TAKE ONE CAPSULE BY MOUTH EVERY MORNING 30 capsule 0   cetirizine (ZYRTEC) 10 MG tablet Take 10 mg by mouth daily.     Cholecalciferol (VITAMIN D3 PO) Take 3 capsules by mouth daily.     Colchicine 0.6 MG CAPS Take 1.2 mg by mouth at bedtime.     diazepam (VALIUM) 5 MG tablet TAKE ONE TABLET BY MOUTH EVERY TWELVE HOURS AS NEEDED FOR MIGRAINE HEADACHES (Patient taking differently: Take 2.5 mg by mouth at bedtime.) 60 tablet 5   diclofenac Sodium (VOLTAREN) 1 % GEL Apply 1 application topically 2 (two) times daily. Apply to left knee     folic acid (FOLVITE) 1 MG tablet Take 1 mg by mouth at bedtime.     ibuprofen (ADVIL) 200 MG tablet Take 400 mg by mouth every 6 (six) hours as needed for moderate pain.     leflunomide (ARAVA) 20 MG tablet Take 20 mg by mouth daily.     lisinopril (ZESTRIL) 20 MG tablet Take 0.5 tablets (10 mg total) by mouth daily. 45 tablet 3   metFORMIN (GLUCOPHAGE-XR) 500 MG 24 hr tablet  TAKE ONE TABLET BY MOUTH TWICE DAILY WITH A MEAL 180 tablet 3   methotrexate 50 MG/2ML injection Inject 22.5 mg into the muscle every Monday.     metoprolol  tartrate (LOPRESSOR) 25 MG tablet Take 0.5 tablets (12.5 mg total) by mouth 2 (two) times daily. 90 tablet 3   omeprazole (PRILOSEC) 40 MG capsule TAKE ONE CAPSULE BY MOUTH DAILY 90 capsule 3   OZEMPIC, 0.25 OR 0.5 MG/DOSE, 2 MG/1.5ML SOPN Inject 0.5 mg into the skin once a week. (Patient taking differently: Inject 0.5 mg into the skin every Monday.) 3 mL 3   phenazopyridine (PYRIDIUM) 95 MG tablet Take 190 mg by mouth 2 (two) times daily as needed for pain.     Polyvinyl Alcohol-Povidone (REFRESH OP) Place 1 drop into both eyes 2 (two) times daily.     predniSONE (DELTASONE) 5 MG tablet Take 5 mg by mouth daily.     primidone (MYSOLINE) 50 MG tablet Take 50-100 mg by mouth See admin instructions. Take 1 tablet (50 mg) by mouth in the morning & take 2 tablets (100 mg) by mouth in the evening.     promethazine (PHENERGAN) 12.5 MG tablet 1-2 tabs po q6h prn nausea 30 tablet 2   rosuvastatin (CRESTOR) 20 MG tablet Take 1 tablet (20 mg total) by mouth daily. (Patient taking differently: Take 20 mg by mouth at bedtime.) 90 tablet 3   sertraline (ZOLOFT) 100 MG tablet TAKE ONE TABLET BY MOUTH EVERY DAY 90 tablet 3   No current facility-administered medications on file prior to visit.    Allergies  Allergen Reactions   Steri-Strip Compound Benzoin [Benzoin Compound] Anaphylaxis    Blisters and itching   Penicillins Rash   Pravastatin     Myalgias   Simvastatin     Myalgias   Hydrocodone-Acetaminophen Nausea And Vomiting    Family History  Problem Relation Age of Onset   Diabetes Mother    Stroke Mother 93   Osteoarthritis Mother    Stroke Brother 13   Heart disease Father        CABG   Lung cancer Father    Prostate cancer Father    Pancreatic cancer Father    Pancreatic cancer Paternal Grandfather    Bipolar disorder Daughter    Anxiety disorder Son    Arthritis Maternal Grandmother        rheumatoid   Heart attack Brother 78       smoker   PE: BP 116/72 (BP Location: Right  Arm, Patient Position: Sitting, Cuff Size: Normal)   Pulse (!) 106   Ht 5' 8.5" (1.74 m)   Wt 154 lb 6.4 oz (70 kg)   SpO2 96%   BMI 23.13 kg/m  Body mass index is 23.13 kg/m.  Wt Readings from Last 3 Encounters:  02/27/21 154 lb 6.4 oz (70 kg)  02/25/21 155 lb 3.2 oz (70.4 kg)  02/19/21 162 lb (73.5 kg)   Constitutional: normal  weight, in wheelchair, weak appearing, occasionally crying during the exam Eyes: PERRLA, EOMI, no exophthalmos ENT: moist mucous membranes, no thyromegaly, no cervical lymphadenopathy Cardiovascular: tachycardia, RR, No MRG Respiratory: CTA B Gastrointestinal: abdomen soft, NT, ND, BS+ Musculoskeletal: no deformities, strength intact in all 4 Skin: moist, warm, no rashes Neurological: no tremor with outstretched hands, DTR normal in all 4  ASSESSMENT: 1. DM2, insulin-independent, controlled, without long term complications, but with Hgly -She was on the freestyle libre CGM in the past but cannot afford it anymore.  2. HL  3.  H/o Overweight  4.  Central adrenal insufficiency  PLAN:  1. Patient with longstanding, previously uncontrolled diabetes, with improved control after adding a GLP-1 receptor agonist to her half maximal metformin ER dose.  We could not use a higher dose due to diarrhea.  Her sugars improved enough so we were able to stop insulin.  At last visit, however, she was off Ozempic as she had problems obtaining it from the pharmacy.  She was able to restart it since then.  However, she contacted me recently with high blood sugars, up to the 200s and also nausea.  Recent HbA1c (from a week ago) was much higher, at 8.3%. -At today's visit, sugars are higher, however, they appear to be improved in the last week after coming off her prednisone tapers.  In fact, we repeated an HbA1c at this visit and this was much better, at 7.7%.  Since sugars are still above target at most times of the day, I advised her to try to increase the metformin to 1000  mg twice a day whenever possible.  She did have diarrhea with this in the past but not consistently.  She is willing to try this, however, if she cannot, we will need to add back basal insulin.  Another option would be to increase Ozempic but I would not want to do this since she has nausea, decreased appetite, and weight loss at the moment. - At this point, I would cleared her for knee surgery from the diabetes point of view - I suggested to:  Patient Instructions  START BACK ON PREDNISONE 5 MG DAILY (today take 10 mg).  Please increase: - Metformin ER [phone] mg 2x daily  Please continue: - Ozempic 0.5 mg weekly  You are clear for surgery from the diabetes point of view.  - You absolutely need to take this medication every day and not skip doses. - Please double the dose if you have a fever, for the duration of the fever. - If you cannot take anything by mouth (vomiting) or you have severe diarrhea so that you eliminate the hydrocortisone pills in your stool, please make sure that you get the hydrocortisone in the vein instead - go to the nearest emergency department/urgent care or you may go to your PCPs office  - Please try to get a MedAlert bracelet or pendant indicating: "Adrenal insufficiency".   Please come back for a follow-up appointment in 3 months.  - we checked her HbA1c: 7.7% (better) - advised to check sugars at different times of the day - 2-3x a day, rotating check times - advised for yearly eye exams >> she is not UTD - return to clinic in 3 months, but I advised her to stay in touch with me about her blood sugars before then   2. HL -Reviewed latest lipid panel from 09/2020: Excellent lipid fractions: Lab Results  Component Value Date   CHOL 141 09/22/2020   HDL 48.50 09/22/2020   LDLCALC 65 09/22/2020   LDLDIRECT 142.3 03/06/2008   TRIG 135.0 09/22/2020   CHOLHDL 3 09/22/2020  -Continues Crestor 10 mg daily and fish oil, without side effects  3.   Overweight -We will continue Ozempic which should also help with weight loss -After starting Ozempic, she was able to lose more than 40 pounds initially, but weight stabilized afterwards.  She is last visit, however, she lost 14 pounds, mostly after coming off prednisone!  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  At that time, due to high blood sugars she also stopped her daily prednisone dose!  As expected, she started to have symptoms of adrenal insufficiency including weight loss, decreased appetite, weakness and feeling terrible. -At this visit, we discussed that it is extremely dangerous practice to stop her daily steroid and I advised her to restart it right away.  I advised her to double the dose and from tomorrow she can continue with 5 mg daily.  -We discussed at length about sick day rules (see above).  I advised her when to double the dose of prednisone and when to take the steroid parenterally.  We also discussed that she will need to have steroid IV at the time of her TKR.  I advised her to wait until she feels better and stable before having the TKR, from the adrenal insufficiency point of view.   Carlus Pavlov, MD PhD [facility]

## 2021-02-27 NOTE — Patient Instructions (Addendum)
START BACK ON PREDNISONE 5 MG DAILY (today take 10 mg).  Please increase: - Metformin ER [phone] mg 2x daily  Please continue: - Ozempic 0.5 mg weekly  You are clear for surgery from the diabetes point of view.  - You absolutely need to take this medication every day and not skip doses. - Please double the dose if you have a fever, for the duration of the fever. - If you cannot take anything by mouth (vomiting) or you have severe diarrhea so that you eliminate the hydrocortisone pills in your stool, please make sure that you get the hydrocortisone in the vein instead - go to the nearest emergency department/urgent care or you may go to your PCPs office  - Please try to get a MedAlert bracelet or pendant indicating: "Adrenal insufficiency".   Please come back for a follow-up appointment in 3 months.

## 2021-03-03 ENCOUNTER — Ambulatory Visit (HOSPITAL_COMMUNITY): Admission: RE | Admit: 2021-03-03 | Payer: 59 | Source: Home / Self Care | Admitting: Orthopedic Surgery

## 2021-03-03 ENCOUNTER — Encounter (HOSPITAL_COMMUNITY): Admission: RE | Payer: Self-pay | Source: Home / Self Care

## 2021-03-03 SURGERY — ARTHROPLASTY, KNEE, TOTAL
Anesthesia: Spinal | Site: Knee | Laterality: Left

## 2021-03-04 ENCOUNTER — Other Ambulatory Visit: Payer: Self-pay | Admitting: Gastroenterology

## 2021-03-06 ENCOUNTER — Ambulatory Visit (HOSPITAL_COMMUNITY): Admission: RE | Admit: 2021-03-06 | Payer: 59 | Source: Home / Self Care | Admitting: Gastroenterology

## 2021-03-06 ENCOUNTER — Encounter (HOSPITAL_COMMUNITY): Admission: RE | Payer: Self-pay | Source: Home / Self Care

## 2021-03-06 SURGERY — ESOPHAGOGASTRODUODENOSCOPY (EGD) WITH PROPOFOL
Anesthesia: Monitor Anesthesia Care | Laterality: Bilateral

## 2021-03-10 ENCOUNTER — Other Ambulatory Visit (HOSPITAL_BASED_OUTPATIENT_CLINIC_OR_DEPARTMENT_OTHER): Payer: Self-pay

## 2021-03-10 ENCOUNTER — Other Ambulatory Visit: Payer: Self-pay

## 2021-03-12 ENCOUNTER — Encounter: Payer: Self-pay | Admitting: Family Medicine

## 2021-03-17 ENCOUNTER — Other Ambulatory Visit: Payer: Self-pay

## 2021-03-17 ENCOUNTER — Ambulatory Visit (HOSPITAL_COMMUNITY): Payer: 59 | Admitting: Certified Registered Nurse Anesthetist

## 2021-03-17 ENCOUNTER — Ambulatory Visit (HOSPITAL_COMMUNITY)
Admission: RE | Admit: 2021-03-17 | Discharge: 2021-03-17 | Disposition: A | Discharge status: Home / Self Care | Payer: 59 | Attending: Gastroenterology | Admitting: Gastroenterology

## 2021-03-17 ENCOUNTER — Encounter (HOSPITAL_COMMUNITY)
Admission: RE | Disposition: A | Discharge status: Home / Self Care | Payer: Self-pay | Source: Home / Self Care | Attending: Gastroenterology

## 2021-03-17 ENCOUNTER — Encounter (HOSPITAL_COMMUNITY): Payer: Self-pay | Admitting: Gastroenterology

## 2021-03-17 DIAGNOSIS — Z9049 Acquired absence of other specified parts of digestive tract: Secondary | ICD-10-CM | POA: Insufficient documentation

## 2021-03-17 DIAGNOSIS — K2970 Gastritis, unspecified, without bleeding: Secondary | ICD-10-CM | POA: Diagnosis not present

## 2021-03-17 DIAGNOSIS — K222 Esophageal obstruction: Secondary | ICD-10-CM | POA: Diagnosis not present

## 2021-03-17 DIAGNOSIS — B3781 Candidal esophagitis: Secondary | ICD-10-CM | POA: Insufficient documentation

## 2021-03-17 DIAGNOSIS — Z7952 Long term (current) use of systemic steroids: Secondary | ICD-10-CM | POA: Insufficient documentation

## 2021-03-17 DIAGNOSIS — K2101 Gastro-esophageal reflux disease with esophagitis, with bleeding: Secondary | ICD-10-CM | POA: Diagnosis not present

## 2021-03-17 DIAGNOSIS — Z7984 Long term (current) use of oral hypoglycemic drugs: Secondary | ICD-10-CM | POA: Diagnosis not present

## 2021-03-17 DIAGNOSIS — R634 Abnormal weight loss: Secondary | ICD-10-CM | POA: Diagnosis not present

## 2021-03-17 DIAGNOSIS — R1310 Dysphagia, unspecified: Secondary | ICD-10-CM | POA: Diagnosis present

## 2021-03-17 DIAGNOSIS — K449 Diaphragmatic hernia without obstruction or gangrene: Secondary | ICD-10-CM | POA: Diagnosis not present

## 2021-03-17 DIAGNOSIS — M069 Rheumatoid arthritis, unspecified: Secondary | ICD-10-CM | POA: Insufficient documentation

## 2021-03-17 DIAGNOSIS — Z8719 Personal history of other diseases of the digestive system: Secondary | ICD-10-CM | POA: Insufficient documentation

## 2021-03-17 HISTORY — PX: ESOPHAGOGASTRODUODENOSCOPY (EGD) WITH PROPOFOL: SHX5813

## 2021-03-17 HISTORY — PX: BIOPSY: SHX5522

## 2021-03-17 LAB — GLUCOSE, CAPILLARY: Glucose-Capillary: 124 mg/dL — ABNORMAL HIGH (ref 70–99)

## 2021-03-17 SURGERY — ESOPHAGOGASTRODUODENOSCOPY (EGD) WITH PROPOFOL
Anesthesia: Monitor Anesthesia Care

## 2021-03-17 MED ORDER — OMEPRAZOLE 40 MG PO CPDR: 40.0000 mg | DELAYED_RELEASE_CAPSULE | Freq: Two times a day (BID) | ORAL | 3 refills | Status: DC

## 2021-03-17 MED ORDER — LACTATED RINGERS IV SOLN: INTRAVENOUS | Status: DC | PRN

## 2021-03-17 MED ORDER — PROPOFOL 1000 MG/100ML IV EMUL
INTRAVENOUS | Status: AC
  Filled 2021-03-17: qty 400

## 2021-03-17 MED ORDER — PROPOFOL 500 MG/50ML IV EMUL
INTRAVENOUS | Status: DC | PRN
  Administered 2021-03-17: 150 ug/kg/min via INTRAVENOUS

## 2021-03-17 SURGICAL SUPPLY — 14 items

## 2021-03-17 NOTE — Transfer of Care (Signed)
Immediate Anesthesia Transfer of Care Note  Patient: Raven [name]  Procedure(s) Performed: Procedure(s): ESOPHAGOGASTRODUODENOSCOPY (EGD) WITH PROPOFOL (N/A) Balloon dilation wire-guided BIOPSY  Patient Location: PACU and Endoscopy Unit  Anesthesia Type:MAC  Level of Consciousness: awake, alert  and oriented  Airway & Oxygen Therapy: Patient Spontanous Breathing and Patient connected to nasal cannula oxygen  Post-op Assessment: Report given to RN and Post -op Vital signs reviewed and stable  Post vital signs: Reviewed and stable  Last Vitals:  Vitals:   03/17/21 0800 03/17/21 0803  BP:  123/81  Pulse:  91  Resp:  16  Temp: (!) 35.8 C 36.7 C  SpO2:  03%    Complications: No apparent anesthesia complications

## 2021-03-17 NOTE — Anesthesia Preprocedure Evaluation (Signed)
Anesthesia Evaluation  Patient identified by MRN, date of birth, ID band Patient awake    Reviewed: Allergy & Precautions, NPO status , Patient's Chart, lab work & pertinent test results  History of Anesthesia Complications Negative for: history of anesthetic complications  Airway Mallampati: II  TM Distance: >3 FB Neck ROM: Full    Dental  (+) Teeth Intact   Pulmonary neg pulmonary ROS,    Pulmonary exam normal        Cardiovascular hypertension, Pt. on medications and Pt. on home beta blockers Normal cardiovascular exam     Neuro/Psych Anxiety Depression negative neurological ROS     GI/Hepatic Neg liver ROS, hiatal hernia, GERD  ,Esophageal stricture   Endo/Other  diabetes, Oral Hypoglycemic Agents  Renal/GU negative Renal ROS  negative genitourinary   Musculoskeletal  (+) Arthritis , Osteoarthritis and Rheumatoid disorders,    Abdominal   Peds  Hematology negative hematology ROS (+)   Anesthesia Other Findings   Reproductive/Obstetrics                             Anesthesia Physical Anesthesia Plan  ASA: 3  Anesthesia Plan: MAC   Post-op Pain Management:    Induction: Intravenous  PONV Risk Score and Plan: Propofol infusion, TIVA and Treatment may vary due to age or medical condition  Airway Management Planned: Natural Airway, Nasal Cannula and Simple Face Mask  Additional Equipment: None  Intra-op Plan:   Post-operative Plan:   Informed Consent: I have reviewed the patients History and Physical, chart, labs and discussed the procedure including the risks, benefits and alternatives for the proposed anesthesia with the patient or authorized representative who has indicated his/her understanding and acceptance.       Plan Discussed with:   Anesthesia Plan Comments:         Anesthesia Quick Evaluation

## 2021-03-17 NOTE — Anesthesia Postprocedure Evaluation (Signed)
Anesthesia Post Note  Patient: Raven [name]  Procedure(s) Performed: ESOPHAGOGASTRODUODENOSCOPY (EGD) WITH PROPOFOL Balloon dilation wire-guided BIOPSY     Patient location during evaluation: Endoscopy Anesthesia Type: MAC Level of consciousness: awake and alert Pain management: pain level controlled Vital Signs Assessment: post-procedure vital signs reviewed and stable Respiratory status: spontaneous breathing, nonlabored ventilation and respiratory function stable Cardiovascular status: blood pressure returned to baseline and stable Postop Assessment: no apparent nausea or vomiting Anesthetic complications: no   No notable events documented.  Last Vitals:  Vitals:   03/17/21 0930 03/17/21 0937  BP: 136/71   Pulse: 73 69  Resp: 17 (!) 9  Temp:    SpO2: 97% 98%    Last Pain:  Vitals:   03/17/21 0937  TempSrc:   PainSc: 0-No pain                 Lidia Collum

## 2021-03-17 NOTE — H&P (Signed)
Primary Care Physician:  Jeoffrey Massed, MD Primary Gastroenterologist:  [facility]  Reason for Visit : EGD with dilation for dysphagia  HPI: Raven [name] is a 65 y.o. female here for outpatient EGD with dilation for esophageal stricture.    Patient with history of proximal esophageal strictures/ webb. Underwent EGD by Dr. Evette Cristal in September 2021 with dilation of proximal stricture by savory up to 12.8 mm. She did fine until 4 weeks ago when she started noticing trouble swallowing certain pills and now having trouble swallowing any solid foods. Able to keep down liquids. Complaining of nausea but denies any vomiting. Occasional loose stools. Denies abdominal pain, blood in the stool or black stool.        She is losing weight because of decreased oral intake.        Patient with history of rheumatoid arthritis   Past Medical History:  Diagnosis Date   Acute medial meniscus tear of right knee    Anemia of chronic disease    Mild, Hb stable consistently   Anxiety and depression    Chest pain 10/2017   +Cardiac CT.  Myocardial perfusion imaging NORMAL, EF>65%   Diabetes mellitus 1995   Managed by Dr. Elvera Lennox (Endo)   GERD (gastroesophageal reflux disease) per pt watches diet and take tums as needed   with hx of prox esoph stricture + dilation procedure   H/O hiatal hernia    slightly larger ("moderate" size) on CT angio done to r/o PE 10/2017.   Hyperlipidemia    Hypertension    Interstitial cystitis    Lichen sclerosus of female genitalia    Migraine    Osteoarthritis, multiple sites    Palpitations 10/2017   3 wk event monitor: symptoms correlate with sinus rhythm with PACs, at times runs of PACs up to 5 beats.   Pseudogout of knee, left    colchicine helpful   Psoriasis    Seasonal allergies    Seropositive rheumatoid arthritis (HCC)    Hands, knees, jaw, elbow: responding to Simponi as of 09/29/15 rheum f/u.  Waning effectiveness on 03/2016 f/u so pt switched to Orencia  injections.  Doing well on chronic low-dose prednisone therapy+ methotrexate +orencia as of 01/2019, 08/2019, 11/2019    Past Surgical History:  Procedure Laterality Date   ABDOMINAL HYSTERECTOMY  1991   for fibroids; ovaries still in   BREAST BIOPSY  2001; 02/12/16   fibrocystic breast disease in 2001 and again in 02/2016 (+scar from prev bx site w/calcifications).   CARDIOVASCULAR STRESS TEST  11/01/2017   Myocard perf imaging: NORMAL (EF >60-65%)   COLONOSCOPY  09/12/2013; 02/25/20   Normal 2015 and 2021: Recall 5 yrs ([facility], Dr. Evette Cristal) due to FH of colon polyps.   CYSTOSCOPY  2004   for chronic UTI   DEXA  10/2016   Bone density normal (T-score 0.0)   ESOPHAGOGASTRODUODENOSCOPY  04/19/2011   Nl except antral gastritis: bx = mild chron gastritis (H pyloria neg)    ESOPHAGOGASTRODUODENOSCOPY (EGD) WITH PROPOFOL N/A 03/27/2020   Esoph stenosis-->dilated.  Mod size hiatal hernia ([facility]). Procedure: ESOPHAGOGASTRODUODENOSCOPY (EGD) WITH PROPOFOL ;  Surgeon: Graylin Shiver, MD;  Location: [facility];  Service: Endoscopy;  Laterality: N/A;   EVENT MONITOR  10/2017   3 wk-->Symptoms correlate with sinus rhythm with PACs, at times runs of PACs up to 5 beats.   GALLBLADDER SURGERY  2019   intraarticular steroid injection  2013   3 R knee &  L X 1; Dr Netta Corrigan   KNEE ARTHROCENTESIS  2013   R knee x 3 & L X 1   KNEE ARTHROSCOPY  10/21/2011   Procedure: ARTHROSCOPY KNEE;  Surgeon: Jacki Cones, MD;  Location: [facility];  Service: Orthopedics;  Laterality: Right;  WITH MEDIAL and lateral shaving of femoral chondyl  with microfracture technique of lateral and medial femoral chondyl suprapatellar synovectomy   LAPAROSCOPIC CHOLECYSTECTOMY  01/2018   SAVORY DILATION N/A 03/27/2020   Procedure: SAVORY DILATION;  Surgeon: Graylin Shiver, MD;  Location: [facility];  Service: Endoscopy;  Laterality: N/A;   TOTAL KNEE ARTHROPLASTY  04/12/2012   RIGHT  Procedure: TOTAL KNEE  ARTHROPLASTY;  Surgeon: Jacki Cones, MD;  Location: [facility];  Service: Orthopedics;  Laterality: Right;  Right Total Knee Arthroplasty   TUBAL LIGATION  1981    Prior to Admission medications   Medication Sig Start Date End Date Taking? Authorizing Provider  Abatacept (ORENCIA) 125 MG/ML SOSY Inject 125 mg into the skin every Monday.   Yes [provider]  acetaminophen (TYLENOL) 500 MG tablet Take 1,000 mg by mouth every 6 (six) hours as needed for moderate pain.   Yes [provider]  amphetamine-dextroamphetamine (ADDERALL XR) 15 MG 24 hr capsule TAKE ONE CAPSULE BY MOUTH EVERY MORNING 02/25/21  Yes McGowen, Maryjean Morn, MD  cetirizine (ZYRTEC) 10 MG tablet Take 10 mg by mouth daily.   Yes [provider]  Colchicine 0.6 MG CAPS Take 1.2 mg by mouth at bedtime.   Yes [provider]  diazepam (VALIUM) 5 MG tablet TAKE ONE TABLET BY MOUTH EVERY TWELVE HOURS AS NEEDED FOR MIGRAINE HEADACHES Patient taking differently: Take 2.5 mg by mouth at bedtime. 12/03/20  Yes McGowen, Maryjean Morn, MD  diclofenac Sodium (VOLTAREN) 1 % GEL Apply 1 application topically 2 (two) times daily. Apply to left knee   Yes [provider]  folic acid (FOLVITE) 1 MG tablet Take 1 mg by mouth at bedtime.   Yes [provider]  ibuprofen (ADVIL) 200 MG tablet Take 400 mg by mouth every 6 (six) hours as needed for moderate pain.   Yes [provider]  leflunomide (ARAVA) 20 MG tablet Take 20 mg by mouth daily.   Yes [provider]  lisinopril (ZESTRIL) 20 MG tablet Take 0.5 tablets (10 mg total) by mouth daily. 12/31/20  Yes McGowen, Maryjean Morn, MD  metFORMIN (GLUCOPHAGE-XR) 500 MG 24 hr tablet Take 1-2 tablets (500-1,000 mg total) by mouth 2 (two) times daily with a meal. 02/27/21  Yes Carlus Pavlov, MD  methotrexate 50 MG/2ML injection Inject 22.5 mg into the muscle every Monday. 07/14/20  Yes [provider]  metoprolol tartrate (LOPRESSOR) 25  MG tablet Take 0.5 tablets (12.5 mg total) by mouth 2 (two) times daily. 05/09/20  Yes Branch, Dorothe Pea, MD  omeprazole (PRILOSEC) 40 MG capsule TAKE ONE CAPSULE BY MOUTH DAILY 12/03/20  Yes McGowen, Maryjean Morn, MD  phenazopyridine (PYRIDIUM) 95 MG tablet Take 190 mg by mouth 2 (two) times daily as needed for pain.   Yes [provider]  Polyvinyl Alcohol-Povidone (REFRESH OP) Place 1 drop into both eyes 2 (two) times daily.   Yes [provider]  predniSONE (DELTASONE) 5 MG tablet Take 1-2 tablets (5-10 mg total) by mouth daily. 02/27/21  Yes Carlus Pavlov, MD  primidone (MYSOLINE) 50 MG tablet Take 50-100 mg by mouth See admin instructions. Take 1 tablet (50 mg) by mouth in  the morning & take 2 tablets (100 mg) by mouth in the evening.   Yes [provider]  promethazine (PHENERGAN) 12.5 MG tablet 1-2 tabs po q6h prn nausea 02/25/21  Yes McGowen, Maryjean Morn, MD  rosuvastatin (CRESTOR) 20 MG tablet Take 1 tablet (20 mg total) by mouth daily. Patient taking differently: Take 20 mg by mouth at bedtime. 12/31/20  Yes McGowen, Maryjean Morn, MD  sertraline (ZOLOFT) 100 MG tablet TAKE ONE TABLET BY MOUTH EVERY DAY 12/03/20  Yes McGowen, Maryjean Morn, MD  Cholecalciferol (VITAMIN D3 PO) Take 3 capsules by mouth daily.    [provider]  Semaglutide,0.25 or 0.5MG /DOS, (OZEMPIC, 0.25 OR 0.5 MG/DOSE,) 2 MG/1.5ML SOPN Inject 0.5 mg into the skin once a week. 02/27/21   Carlus Pavlov, MD    Scheduled Meds: Continuous Infusions: PRN Meds:.  Allergies as of 03/04/2021 - Review Complete 02/27/2021  Allergen Reaction Noted   Steri-strip compound benzoin [benzoin compound] Anaphylaxis 05/09/2020   Penicillins Rash    Pravastatin  04/21/2015   Simvastatin  04/21/2015   Hydrocodone-acetaminophen Nausea And Vomiting     Family History  Problem Relation Age of Onset   Diabetes Mother    Stroke Mother 10   Osteoarthritis Mother    Stroke Brother 52   Heart disease Father         CABG   Lung cancer Father    Prostate cancer Father    Pancreatic cancer Father    Pancreatic cancer Paternal Grandfather    Bipolar disorder Daughter    Anxiety disorder Son    Arthritis Maternal Grandmother        rheumatoid   Heart attack Brother 81       smoker    Social History   Socioeconomic History   Marital status: Married    Spouse name: Not on file   Number of children: Not on file   Years of education: Not on file   Highest education level: Not on file  Occupational History   Not on file  Tobacco Use   Smoking status: Never   Smokeless tobacco: Never  Vaping Use   Vaping Use: Never used  Substance and Sexual Activity   Alcohol use: No   Drug use: No   Sexual activity: Not on file  Other Topics Concern   Not on file  Social History Narrative   Married, 3 children (2 in [place], 1 in [place]).  4 grandchildren.   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)   No tob/alc/drugs.   2 cups of coffee/day x 3 months   Regular exercise- no-due to arthritis.   Religion affecting care, "it allows stress management:"   Social Determinants of Health   Financial Resource Strain: Not on file  Food Insecurity: Not on file  Transportation Needs: Not on file  Physical Activity: Not on file  Stress: Not on file  Social Connections: Not on file  Intimate Partner Violence: Not on file    Review of Systems: All negative except as stated above in HPI.  Physical Exam: Vital signs: Vitals:   03/17/21 0800 03/17/21 0803  BP:  123/81  Pulse:  91  Resp:  16  Temp: (!) 96.4 F (35.8 C) 98.1 F (36.7 C)  SpO2:  97%     General:   Alert,  Well-developed, well-nourished, pleasant and cooperative in NAD Lungs:  Clear throughout to auscultation.   No wheezes, crackles, or rhonchi. No acute distress. Heart:  Regular rate and rhythm; no murmurs, clicks,  rubs,  or gallops. Abdomen: Soft, nontender, nondistended, bowel sounds present.  No peritoneal signs Rectal:   Deferred  GI:  Lab Results: No results for input(s): WBC, HGB, HCT, PLT in the last 72 hours. BMET No results for input(s): NA, K, CL, CO2, GLUCOSE, BUN, CREATININE, CALCIUM in the last 72 hours. LFT No results for input(s): PROT, ALBUMIN, AST, ALT, ALKPHOS, BILITOT, BILIDIR, IBILI in the last 72 hours. PT/INR No results for input(s): LABPROT, INR in the last 72 hours.   Studies/Results: No results found.  Impression/Plan: -Proximal esophageal stricture -Esophageal dysphagia  Recommendations ------------------------- -Proceed with EGD with possible dilation today.  Risks (bleeding, infection, bowel perforation that could require surgery, sedation-related changes in cardiopulmonary systems), benefits (identification and possible treatment of source of symptoms, exclusion of certain causes of symptoms), and alternatives (watchful waiting, radiographic imaging studies, empiric medical treatment)  were explained to patient/family in detail and patient wishes to proceed.     LOS: 0 days   Kathi Der  MD, FACP 03/17/2021, 8:50 AM  Contact #  [phone]

## 2021-03-17 NOTE — Op Note (Signed)
[facility] Patient Name: Raven [name] Procedure Date: 03/17/2021 MRN: [id number] Attending MD: Kathi Der , MD Date of Birth: [birth year] CSN: [id number] Age: 65 Admit Type: Outpatient Procedure:                Upper GI endoscopy Indications:              Dysphagia, For therapy of esophageal stricture Providers:                Kathi Der, MD, Fransisca Connors, Harrington Challenger, Technician Referring MD:              Medicines:                Sedation Administered by an Anesthesia Professional Complications:            No immediate complications. Estimated Blood Loss:     Estimated blood loss was minimal. Procedure:                Pre-Anesthesia Assessment:                           - Prior to the procedure, a History and Physical                            was performed, and patient medications and                            allergies were reviewed. The patient's tolerance of                            previous anesthesia was also reviewed. The risks                            and benefits of the procedure and the sedation                            options and risks were discussed with the patient.                            All questions were answered, and informed consent                            was obtained. Prior Anticoagulants: The patient has                            taken no previous anticoagulant or antiplatelet                            agents. ASA Grade Assessment: III - A patient with                            severe systemic disease. After reviewing the risks  and benefits, the patient was deemed in                            satisfactory condition to undergo the procedure.                           After obtaining informed consent, the endoscope was                            passed under direct vision. Throughout the                            procedure, the patient's blood pressure,  pulse, and                            oxygen saturations were monitored continuously. The                            GIF-H190 ([id number]) Olympus endoscope was introduced                            through the mouth, and advanced to the second part                            of duodenum. The upper GI endoscopy was                            accomplished without difficulty. The patient                            tolerated the procedure well. Scope In: Scope Out: Findings:      One benign-appearing, intrinsic moderate (circumferential scarring or       stenosis; an endoscope may pass) stenosis was found in the distal       esophagus. The stenosis was traversed. A TTS dilator was passed through       the scope. Dilation with a 04-22-11 mm balloon dilator was performed to       12 mm. The dilation site was examined following endoscope reinsertion       and showed moderate mucosal disruption, moderate improvement in luminal       narrowing and no perforation.      One benign-appearing, intrinsic moderate (circumferential scarring or       stenosis; an endoscope may pass) stenosis was found in the proximal       esophagus. The stenosis was traversed. A TTS dilator was passed through       the scope. Dilation with a 04-22-11 mm balloon dilator was performed to       10 mm. The dilation site was examined following endoscope reinsertion       and showed moderate mucosal disruption, moderate improvement in luminal       narrowing and no perforation.      Patchy, white plaques were found in the entire esophagus. Biopsies were       taken with a cold forceps for histology.      A large hiatal hernia was present.  Scattered mild inflammation characterized by adherent blood, congestion       (edema) and erythema was found in the gastric body and in the gastric       antrum.      The cardia and gastric fundus were normal on retroflexion.      The duodenal bulb, first portion of the duodenum and  second portion of       the duodenum were normal. Impression:               - Benign-appearing esophageal stenosis. Dilated.                           - Benign-appearing esophageal stenosis. Dilated.                           - Esophageal plaques were found, suspicious for                            candidiasis. Biopsied.                           - Large hiatal hernia.                           - Gastritis.                           - Normal duodenal bulb, first portion of the                            duodenum and second portion of the duodenum. Moderate Sedation:      Moderate (conscious) sedation was personally administered by an       anesthesia professional. The following parameters were monitored: oxygen       saturation, heart rate, blood pressure, and response to care. Recommendation:           - Patient has a contact number available for                            emergencies. The signs and symptoms of potential                            delayed complications were discussed with the                            patient. Return to normal activities tomorrow.                            Written discharge instructions were provided to the                            patient.                           - Resume previous diet.                           - Continue present medications.                           -  Await pathology results.                           - Repeat upper endoscopy in 1 month for retreatment.                           - Return to GI clinic PRN.                           - Use Protonix (pantoprazole) 40 mg PO BID. Procedure Code(s):        --- Professional ---                           [phone], Esophagogastroduodenoscopy, flexible,                            transoral; with transendoscopic balloon dilation of                            esophagus (less than 30 mm diameter)                           43239, 59, Esophagogastroduodenoscopy, flexible,                             transoral; with biopsy, single or multiple Diagnosis Code(s):        --- Professional ---                           K22.2, Esophageal obstruction                           K22.9, Disease of esophagus, unspecified                           K44.9, Diaphragmatic hernia without obstruction or                            gangrene                           K29.70, Gastritis, unspecified, without bleeding                           R13.10, Dysphagia, unspecified CPT copyright 2019 [employer]. All rights reserved. The codes documented in this report are preliminary and upon coder review may  be revised to meet current compliance requirements. Kathi Der, MD Kathi Der, MD 03/17/2021 9:22:47 AM Number of Addenda: 0

## 2021-03-17 NOTE — Discharge Instructions (Signed)

## 2021-03-18 ENCOUNTER — Encounter (HOSPITAL_COMMUNITY): Payer: Self-pay | Admitting: Gastroenterology

## 2021-03-18 LAB — SURGICAL PATHOLOGY

## 2021-03-31 ENCOUNTER — Ambulatory Visit: Payer: 59 | Admitting: Family Medicine

## 2021-03-31 ENCOUNTER — Other Ambulatory Visit: Payer: Self-pay

## 2021-03-31 ENCOUNTER — Encounter: Payer: Self-pay | Admitting: Family Medicine

## 2021-03-31 VITALS — BP 87/64 | HR 104 | Temp 97.7°F | Ht 68.5 in | Wt 141.2 lb

## 2021-03-31 DIAGNOSIS — E7800 Pure hypercholesterolemia, unspecified: Secondary | ICD-10-CM | POA: Diagnosis not present

## 2021-03-31 DIAGNOSIS — R197 Diarrhea, unspecified: Secondary | ICD-10-CM

## 2021-03-31 DIAGNOSIS — I10 Essential (primary) hypertension: Secondary | ICD-10-CM | POA: Diagnosis not present

## 2021-03-31 DIAGNOSIS — F411 Generalized anxiety disorder: Secondary | ICD-10-CM

## 2021-03-31 DIAGNOSIS — F988 Other specified behavioral and emotional disorders with onset usually occurring in childhood and adolescence: Secondary | ICD-10-CM | POA: Diagnosis not present

## 2021-03-31 DIAGNOSIS — E861 Hypovolemia: Secondary | ICD-10-CM

## 2021-03-31 DIAGNOSIS — I9589 Other hypotension: Secondary | ICD-10-CM

## 2021-03-31 LAB — COMPREHENSIVE METABOLIC PANEL
ALT: 39 U/L — ABNORMAL HIGH (ref 0–35)
AST: 54 U/L — ABNORMAL HIGH (ref 0–37)
Albumin: 4.2 g/dL (ref 3.5–5.2)
Alkaline Phosphatase: 63 U/L (ref 39–117)
BUN: 10 mg/dL (ref 6–23)
CO2: 25 mEq/L (ref 19–32)
Calcium: 8.5 mg/dL (ref 8.4–10.5)
Chloride: 99 mEq/L (ref 96–112)
Creatinine, Ser: 0.83 mg/dL (ref 0.40–1.20)
GFR: 73.8 mL/min (ref >60.00)
Glucose, Bld: 107 mg/dL — ABNORMAL HIGH (ref 70–99)
Potassium: 4.2 mEq/L (ref 3.5–5.1)
Sodium: 139 mEq/L (ref 135–145)
Total Bilirubin: 0.6 mg/dL (ref 0.2–1.2)
Total Protein: 7.1 g/dL (ref 6.0–8.3)

## 2021-03-31 LAB — LIPID PANEL
Cholesterol: 108 mg/dL (ref 0–200)
HDL: 32.2 mg/dL — ABNORMAL LOW (ref >39.00)
NonHDL: 76.15
Total CHOL/HDL Ratio: 3
Triglycerides: 209 mg/dL — ABNORMAL HIGH (ref 0.0–149.0)
VLDL: 41.8 mg/dL — ABNORMAL HIGH (ref 0.0–40.0)

## 2021-03-31 LAB — LDL CHOLESTEROL, DIRECT: Direct LDL: 47 mg/dL

## 2021-03-31 MED ORDER — AMPHETAMINE-DEXTROAMPHET ER 15 MG PO CP24: ORAL_CAPSULE | ORAL | 0 refills | Status: DC

## 2021-03-31 NOTE — Patient Instructions (Signed)
-  Stop taking your  lisinopril

## 2021-03-31 NOTE — Progress Notes (Addendum)
OFFICE VISIT  03/31/2021  CC:  Chief Complaint  Patient presents with   Follow-up    RCI, pt is fasting. Would like to discuss flu shot with provider. Has not had recent eye exam with [facility] in [place].   HPI:    Patient is a 65 y.o. Caucasian female who presents for f/u adult ADD, HLD, anxiety/insomnia, and HTN. I last saw her about 1 mo ago. A/P as of that visit: "1) DM 2, historically well controlled on metformin and ozempic but last a1c 02/19/21 was 8.3%. Home glucoses don't really correlate with this, though. She has f/u with Dr. Elvera Lennox in 2d.  I did not change anything today or repeat any labs.   2) Nausea, poor appetite: d/t chronic severe pain. Rx'd phenergan 12.5mg , 1-2 q6h prn today. Therapeutic expectations and side effect profile of medication discussed today.  Patient's questions answered."  INTERIM HX: She is now scheduled for L TKA 05/26/21.  She got EGD with esoph dilation procedure 2 weeks ago. Candida esophagitis dx'd and is on diflucan. Her upset stomach continues but she apparently never had problems with swallowing.  Still with chronic diarrhea. Smells cause nausea worse.  BRAT diet.  Fluids intake ok most days but not yesterday. Continues to lose wt. DM: glucoses still 110-190.    Her endocrinologist added leflunomide at recent visit and pt says pain level down quite a bit. Home bp's: 130s/90.  ADD: Pt states all is going well with the med at current dosing: much improved focus, concentration, task completion.  Less frustration, better multitasking, less impulsivity and restlessness.  Mood is stable. No side effects from the medication.  Anxiety level pretty stable considering everything she's going on lately. PMP AWARE reviewed today: most recent rx for adderall xr 15mg  was filled 02/25/21, # 30, rx by me. Most recent diazepam 5mg  rx filled 02/21/21, #60, rx by me. No red flags.   Past Medical History:  Diagnosis Date   Acute medial meniscus  tear of right knee    Anemia of chronic disease    Mild, Hb stable consistently   Anxiety and depression    Chest pain 10/2017   +Cardiac CT.  Myocardial perfusion imaging NORMAL, EF>65%   Diabetes mellitus 1995   Managed by Dr. 02/23/21 (Endo)   GERD (gastroesophageal reflux disease) per pt watches diet and take tums as needed   with hx of prox esoph stricture + dilation procedure   H/O hiatal hernia    slightly larger ("moderate" size) on CT angio done to r/o PE 10/2017.   Hyperlipidemia    Hypertension    Interstitial cystitis    Lichen sclerosus of female genitalia    Migraine    Osteoarthritis, multiple sites    Palpitations 10/2017   3 wk event monitor: symptoms correlate with sinus rhythm with PACs, at times runs of PACs up to 5 beats.   Pseudogout of knee, left    colchicine helpful   Psoriasis    Seasonal allergies    Seropositive rheumatoid arthritis (HCC)    Hands, knees, jaw, elbow: responding to Simponi as of 09/29/15 rheum f/u.  Waning effectiveness on 03/2016 f/u so pt switched to Orencia injections.  Doing well on chronic low-dose prednisone therapy+ methotrexate +orencia as of 01/2019, 08/2019, 11/2019    Past Surgical History:  Procedure Laterality Date   ABDOMINAL HYSTERECTOMY  1991   for fibroids; ovaries still in   BIOPSY  03/17/2021   Procedure: BIOPSY;  Surgeon: 12/2019, MD;  Location: [facility];  Service: Gastroenterology;;   BREAST BIOPSY  2001; 02/12/16   fibrocystic breast disease in 2001 and again in 02/2016 (+scar from prev bx site w/calcifications).   CARDIOVASCULAR STRESS TEST  11/01/2017   Myocard perf imaging: NORMAL (EF >60-65%)   COLONOSCOPY  09/12/2013; 02/25/20   Normal 2015 and 2021: Recall 5 yrs ([facility], Dr. Evette Cristal) due to FH of colon polyps.   CYSTOSCOPY  2004   for chronic UTI   DEXA  10/2016   Bone density normal (T-score 0.0)   ESOPHAGOGASTRODUODENOSCOPY  04/19/2011   Nl except antral gastritis: bx = mild chron gastritis (H  pyloria neg)    ESOPHAGOGASTRODUODENOSCOPY (EGD) WITH PROPOFOL N/A 03/27/2020   Esoph stenosis-->dilated.  Mod size hiatal hernia ([facility]). Procedure: ESOPHAGOGASTRODUODENOSCOPY (EGD) WITH PROPOFOL ;  Surgeon: Graylin Shiver, MD;  Location: [facility];  Service: Endoscopy;  Laterality: N/A;   ESOPHAGOGASTRODUODENOSCOPY (EGD) WITH PROPOFOL N/A 03/17/2021   Esoph stricture->dilated.  Also +esoph candidiasis, bx inflammation.  Procedure: ESOPHAGOGASTRODUODENOSCOPY (EGD) WITH PROPOFOL;  Surgeon: Kathi Der, MD;  Location: [facility];  Service: Gastroenterology;  Laterality: N/A;   EVENT MONITOR  10/2017   3 wk-->Symptoms correlate with sinus rhythm with PACs, at times runs of PACs up to 5 beats.   GALLBLADDER SURGERY  2019   intraarticular steroid injection  2013   3 R knee & L X 1; Dr Netta Corrigan   KNEE ARTHROCENTESIS  2013   R knee x 3 & L X 1   KNEE ARTHROSCOPY  10/21/2011   Procedure: ARTHROSCOPY KNEE;  Surgeon: Jacki Cones, MD;  Location: [facility];  Service: Orthopedics;  Laterality: Right;  WITH MEDIAL and lateral shaving of femoral chondyl  with microfracture technique of lateral and medial femoral chondyl suprapatellar synovectomy   LAPAROSCOPIC CHOLECYSTECTOMY  01/2018   SAVORY DILATION N/A 03/27/2020   Procedure: SAVORY DILATION;  Surgeon: Graylin Shiver, MD;  Location: [facility];  Service: Endoscopy;  Laterality: N/A;   TOTAL KNEE ARTHROPLASTY  04/12/2012   RIGHT  Procedure: TOTAL KNEE ARTHROPLASTY;  Surgeon: Jacki Cones, MD;  Location: [facility];  Service: Orthopedics;  Laterality: Right;  Right Total Knee Arthroplasty   TUBAL LIGATION  1981    Outpatient Medications Prior to Visit  Medication Sig Dispense Refill   Abatacept (ORENCIA) 125 MG/ML SOSY Inject 125 mg into the skin every Monday.     acetaminophen (TYLENOL) 500 MG tablet Take 1,000 mg by mouth every 6 (six) hours as needed for moderate pain.     cetirizine (ZYRTEC) 10 MG tablet  Take 10 mg by mouth daily.     Cholecalciferol (VITAMIN D3 PO) Take 3 capsules by mouth daily.     Colchicine 0.6 MG CAPS Take 1.2 mg by mouth at bedtime.     diazepam (VALIUM) 5 MG tablet TAKE ONE TABLET BY MOUTH EVERY TWELVE HOURS AS NEEDED FOR MIGRAINE HEADACHES (Patient taking differently: Take 2.5 mg by mouth at bedtime.) 60 tablet 5   diclofenac Sodium (VOLTAREN) 1 % GEL Apply 1 application topically 2 (two) times daily. Apply to left knee     folic acid (FOLVITE) 1 MG tablet Take 1 mg by mouth at bedtime.     ibuprofen (ADVIL) 200 MG tablet Take 400 mg by mouth every 6 (six) hours as needed for moderate pain.     leflunomide (ARAVA) 20 MG tablet Take 20 mg by mouth daily.     lisinopril (ZESTRIL) 20 MG tablet Take  0.5 tablets (10 mg total) by mouth daily. 45 tablet 3   metFORMIN (GLUCOPHAGE-XR) 500 MG 24 hr tablet Take 1-2 tablets (500-1,000 mg total) by mouth 2 (two) times daily with a meal. 360 tablet 3   methotrexate 50 MG/2ML injection Inject 22.5 mg into the muscle every Monday.     metoprolol tartrate (LOPRESSOR) 25 MG tablet Take 0.5 tablets (12.5 mg total) by mouth 2 (two) times daily. 90 tablet 3   omeprazole (PRILOSEC) 40 MG capsule Take 1 capsule (40 mg total) by mouth 2 (two) times daily. 60 capsule 3   phenazopyridine (PYRIDIUM) 95 MG tablet Take 190 mg by mouth 2 (two) times daily as needed for pain.     Polyvinyl Alcohol-Povidone (REFRESH OP) Place 1 drop into both eyes 2 (two) times daily.     predniSONE (DELTASONE) 5 MG tablet Take 1-2 tablets (5-10 mg total) by mouth daily. 180 tablet 3   primidone (MYSOLINE) 50 MG tablet Take 50-100 mg by mouth See admin instructions. Take 1 tablet (50 mg) by mouth in the morning & take 2 tablets (100 mg) by mouth in the evening.     promethazine (PHENERGAN) 12.5 MG tablet 1-2 tabs po q6h prn nausea 30 tablet 2   rosuvastatin (CRESTOR) 20 MG tablet Take 1 tablet (20 mg total) by mouth daily. (Patient taking differently: Take 20 mg by mouth  at bedtime.) 90 tablet 3   Semaglutide,0.25 or 0.5MG /DOS, (OZEMPIC, 0.25 OR 0.5 MG/DOSE,) 2 MG/1.5ML SOPN Inject 0.5 mg into the skin once a week. 3 mL 3   sertraline (ZOLOFT) 100 MG tablet TAKE ONE TABLET BY MOUTH EVERY DAY 90 tablet 3   fluconazole (DIFLUCAN) 100 MG tablet Take 100 mg by mouth daily.     amphetamine-dextroamphetamine (ADDERALL XR) 15 MG 24 hr capsule TAKE ONE CAPSULE BY MOUTH EVERY MORNING 30 capsule 0   No facility-administered medications prior to visit.    Allergies  Allergen Reactions   Steri-Strip Compound Benzoin [Benzoin Compound] Anaphylaxis    Blisters and itching   Penicillins Rash   Pravastatin     Myalgias   Simvastatin     Myalgias   Hydrocodone-Acetaminophen Nausea And Vomiting    ROS As per HPI  PE: Vitals with BMI 03/31/2021 03/17/2021 03/17/2021  Height 5' 8.5" - -  Weight 141 lbs 3 oz - -  BMI 21.15 - -  Systolic 87 - 136  Diastolic 64 - 71  Pulse 104 69 73    Gen: Alert, tired-appearing but NAD.  Patient is oriented to person, place, time, and situation. ENT: dry mm's CV: Regular, mild tachy (105), no m/r/g Chest is clear, no wheezing or rales. Normal symmetric air entry throughout both lung fields. No chest wall deformities or tenderness. EXT: no clubbing or cyanosis.  no edema.    LABS:    Chemistry      Component Value Date/Time   NA 134 (L) 02/19/2021 0945   NA 137 11/27/2020 0000   K 3.9 02/19/2021 0945   CL 99 02/19/2021 0945   CO2 23 02/19/2021 0945   BUN 13 02/19/2021 0945   BUN 11 11/27/2020 0000   CREATININE 0.75 02/19/2021 0945   CREATININE 0.82 10/14/2017 1524   GLU 192 11/27/2020 0000      Component Value Date/Time   CALCIUM 8.6 (L) 02/19/2021 0945   ALKPHOS 57 02/19/2021 0945   AST 41 02/19/2021 0945   ALT 46 (H) 02/19/2021 0945   BILITOT 0.8 02/19/2021 0945  Lab Results  Component Value Date   WBC 8.2 02/19/2021   HGB 11.8 (L) 02/19/2021   HCT 36.6 02/19/2021   MCV 82.2 02/19/2021   PLT 269  02/19/2021   Lab Results  Component Value Date   TSH 1.37 11/09/2019   Lab Results  Component Value Date   CHOL 141 09/22/2020   HDL 48.50 09/22/2020   LDLCALC 65 09/22/2020   LDLDIRECT 142.3 03/06/2008   TRIG 135.0 09/22/2020   CHOLHDL 3 09/22/2020   Lab Results  Component Value Date   HGBA1C 7.7 (A) 02/27/2021   IMPRESSION AND PLAN:  1) Hypotension: d/t inadequate hydration in the context of chronic diarrhea and antihypertensive use. BP low today but pt reports pretty normal at home. Unsure how much adrenal insufficiency d/t chronic prednisone treatment is playing a role. D/C lisinopril. Lytes/cr today.  2) HLD: tolerating crestor 20 qd. FLP and hepatic panel today.  3) Diarrhea: ? 1 mo or so now. C diff pcr, stool culture, and o&p ordered. Pt immunocompromised by her RA meds.  4) Adult ADD: stable. I did electronic rx's for adderall xr 15mg  today for this month.  Appropriate fill on/after date was noted on each rx.  5) DM 2, control not ideal.  Dr. recently increased her metformin.  6) L knee osteoarthritis: pt scheduled for L TKA in a couple months.  7) Wt loss, anorexia/nausea.  With her immunosuppression need to keep possible endocarditis in mind. CBC w/diff today. Esoph candidiasis on EGD recently, also stricture dilation (no dysphagia or regurg).  Pt rarely takes NSAID. Pt not improved since her EGD. Has GI f/u soon.  An After Visit Summary was printed and given to the patient.  FOLLOW UP: Return in about 2 weeks (around 04/14/2021) for f/u low bp/dehyd/diarrhea.  Signed:  06/14/2021, MD           03/31/2021

## 2021-04-01 ENCOUNTER — Ambulatory Visit: Payer: 59 | Admitting: Internal Medicine

## 2021-04-01 LAB — CBC WITH DIFFERENTIAL/PLATELET
Basophils Absolute: 0.1 10*3/uL (ref 0.0–0.1)
Basophils Relative: 0.9 % (ref 0.0–3.0)
Eosinophils Absolute: 0.1 10*3/uL (ref 0.0–0.7)
Eosinophils Relative: 1.9 % (ref 0.0–5.0)
HCT: 35.8 % — ABNORMAL LOW (ref 36.0–46.0)
Hemoglobin: 11.5 g/dL — ABNORMAL LOW (ref 12.0–15.0)
Lymphocytes Relative: 48.6 % — ABNORMAL HIGH (ref 12.0–46.0)
Lymphs Abs: 3.2 10*3/uL (ref 0.7–4.0)
MCHC: 32.1 g/dL (ref 30.0–36.0)
MCV: 84 fl (ref 78.0–100.0)
Monocytes Absolute: 0.7 10*3/uL (ref 0.1–1.0)
Monocytes Relative: 10.8 % (ref 3.0–12.0)
Neutro Abs: 2.5 10*3/uL (ref 1.4–7.7)
Neutrophils Relative %: 37.8 % — ABNORMAL LOW (ref 43.0–77.0)
Platelets: 258 10*3/uL (ref 150.0–400.0)
RBC: 4.27 Mil/uL (ref 3.87–5.11)
RDW: 17.8 % — ABNORMAL HIGH (ref 11.5–15.5)
WBC: 6.6 10*3/uL (ref 4.0–10.5)

## 2021-04-01 NOTE — Addendum Note (Signed)
Addended by: Paschal Dopp on: 04/01/2021 04:00 PM   Modules accepted: Orders

## 2021-04-05 LAB — STOOL CULTURE: E coli, Shiga toxin Assay: NEGATIVE

## 2021-04-07 LAB — OVA AND PARASITE EXAMINATION
CONCENTRATE RESULT:: NONE SEEN
MICRO NUMBER:: 12404424
SPECIMEN QUALITY:: ADEQUATE
TRICHROME RESULT:: NONE SEEN

## 2021-04-07 NOTE — Progress Notes (Signed)
Noted  

## 2021-04-13 ENCOUNTER — Ambulatory Visit: Payer: 59 | Admitting: Family Medicine

## 2021-04-13 ENCOUNTER — Encounter: Payer: Self-pay | Admitting: Family Medicine

## 2021-04-13 ENCOUNTER — Other Ambulatory Visit: Payer: Self-pay

## 2021-04-13 VITALS — BP 117/77 | HR 87 | Temp 97.9°F | Ht 68.5 in | Wt 143.2 lb

## 2021-04-13 DIAGNOSIS — R197 Diarrhea, unspecified: Secondary | ICD-10-CM

## 2021-04-13 DIAGNOSIS — I952 Hypotension due to drugs: Secondary | ICD-10-CM | POA: Diagnosis not present

## 2021-04-13 DIAGNOSIS — I10 Essential (primary) hypertension: Secondary | ICD-10-CM | POA: Diagnosis not present

## 2021-04-13 DIAGNOSIS — B029 Zoster without complications: Secondary | ICD-10-CM

## 2021-04-13 MED ORDER — VALACYCLOVIR HCL 1 G PO TABS: ORAL_TABLET | ORAL | 0 refills | Status: DC

## 2021-04-13 NOTE — Progress Notes (Signed)
OFFICE VISIT  04/13/2021  CC:  Chief Complaint  Patient presents with   Follow-up    Low bp/dehydration/diarrrhea; symptoms have resolved.    HPI:    Patient is a 65 y.o. Caucasian female who presents for 2 wk f/u hypotension and diarrhea. A/P as of last visit: "1) Hypotension: d/t inadequate hydration in the context of chronic diarrhea and antihypertensive use. BP low today but pt reports pretty normal at home. Unsure how much adrenal insufficiency d/t chronic prednisone treatment is playing a role. D/C lisinopril. Lytes/cr today.   2) HLD: tolerating crestor 20 qd. FLP and hepatic panel today.   3) Diarrhea: ? 1 mo or so now. C diff pcr, stool culture, and o&p ordered. Pt immunocompromised by her RA meds.   4) Adult ADD: stable. I did electronic rx's for adderall xr 15mg  today for this month.  Appropriate fill on/after date was noted on each rx.   5) DM 2, control not ideal.  Dr. recently increased her metformin.   6) L knee osteoarthritis: pt scheduled for L TKA in a couple months.   7) Wt loss, anorexia/nausea.  With her immunosuppression need to keep possible endocarditis in mind. CBC w/diff today. Esoph candidiasis on EGD recently, also stricture dilation (no dysphagia or regurg).  Pt rarely takes NSAID. Pt not improved since her EGD. Has GI f/u soon."  INTERIM HX: Feeling much better.  Diarrhea; Stool cultures neg, O&P neg.  C diff did not get done b/c this stool container was not turned in. Having much less frequent loose stool and no longer has watery bms. Taking otc antidiarrheal q2d or so. Energy level much improved.  Home bp's: 120s/85 avg, some diastolics into low 90s.  HR avg 95. Has not restarted her lisinopril yet.  Has 1 wk hx of R side pain constant, tender to touch, started as persistent itch, just bra touching it is quite uncomfortable. No rash has come out but this hurts like past episodes of shingles--her hx of shingles in R hip  area.   Past Medical History:  Diagnosis Date   Acute medial meniscus tear of right knee    Anemia of chronic disease    Mild, Hb stable consistently   Anxiety and depression    Chest pain 10/2017   +Cardiac CT.  Myocardial perfusion imaging NORMAL, EF>65%   Diabetes mellitus 1995   Managed by Dr. 11/2017 (Endo)   GERD (gastroesophageal reflux disease) per pt watches diet and take tums as needed   with hx of prox esoph stricture + dilation procedure   H/O hiatal hernia    slightly larger ("moderate" size) on CT angio done to r/o PE 10/2017.   Hyperlipidemia    Hypertension    Interstitial cystitis    Lichen sclerosus of female genitalia    Migraine    Osteoarthritis, multiple sites    Palpitations 10/2017   3 wk event monitor: symptoms correlate with sinus rhythm with PACs, at times runs of PACs up to 5 beats.   Pseudogout of knee, left    colchicine helpful   Psoriasis    Seasonal allergies    Seropositive rheumatoid arthritis (HCC)    Hands, knees, jaw, elbow: responding to Simponi as of 09/29/15 rheum f/u.  Waning effectiveness on 03/2016 f/u so pt switched to Orencia injections.  Doing well on chronic low-dose prednisone therapy+ methotrexate +orencia as of 01/2019, 08/2019, 11/2019    Past Surgical History:  Procedure Laterality Date   ABDOMINAL HYSTERECTOMY  1991   for fibroids; ovaries still in   BIOPSY  03/17/2021   Procedure: BIOPSY;  Surgeon: Kathi Der, MD;  Location: [facility];  Service: Gastroenterology;;   BREAST BIOPSY  2001; 02/12/16   fibrocystic breast disease in 2001 and again in 02/2016 (+scar from prev bx site w/calcifications).   CARDIOVASCULAR STRESS TEST  11/01/2017   Myocard perf imaging: NORMAL (EF >60-65%)   COLONOSCOPY  09/12/2013; 02/25/20   Normal 2015 and 2021: Recall 5 yrs ([facility], Dr. Evette Cristal) due to FH of colon polyps.   CYSTOSCOPY  2004   for chronic UTI   DEXA  10/2016   Bone density normal (T-score 0.0)   ESOPHAGOGASTRODUODENOSCOPY   04/19/2011   Nl except antral gastritis: bx = mild chron gastritis (H pyloria neg)    ESOPHAGOGASTRODUODENOSCOPY (EGD) WITH PROPOFOL N/A 03/27/2020   Esoph stenosis-->dilated.  Mod size hiatal hernia ([facility]). Procedure: ESOPHAGOGASTRODUODENOSCOPY (EGD) WITH PROPOFOL ;  Surgeon: Graylin Shiver, MD;  Location: [facility];  Service: Endoscopy;  Laterality: N/A;   ESOPHAGOGASTRODUODENOSCOPY (EGD) WITH PROPOFOL N/A 03/17/2021   Esoph stricture->dilated.  Also +esoph candidiasis, bx inflammation.  Procedure: ESOPHAGOGASTRODUODENOSCOPY (EGD) WITH PROPOFOL;  Surgeon: Kathi Der, MD;  Location: [facility];  Service: Gastroenterology;  Laterality: N/A;   EVENT MONITOR  10/2017   3 wk-->Symptoms correlate with sinus rhythm with PACs, at times runs of PACs up to 5 beats.   GALLBLADDER SURGERY  2019   intraarticular steroid injection  2013   3 R knee & L X 1; Dr Netta Corrigan   KNEE ARTHROCENTESIS  2013   R knee x 3 & L X 1   KNEE ARTHROSCOPY  10/21/2011   Procedure: ARTHROSCOPY KNEE;  Surgeon: Jacki Cones, MD;  Location: [facility];  Service: Orthopedics;  Laterality: Right;  WITH MEDIAL and lateral shaving of femoral chondyl  with microfracture technique of lateral and medial femoral chondyl suprapatellar synovectomy   LAPAROSCOPIC CHOLECYSTECTOMY  01/2018   SAVORY DILATION N/A 03/27/2020   Procedure: SAVORY DILATION;  Surgeon: Graylin Shiver, MD;  Location: [facility];  Service: Endoscopy;  Laterality: N/A;   TOTAL KNEE ARTHROPLASTY  04/12/2012   RIGHT  Procedure: TOTAL KNEE ARTHROPLASTY;  Surgeon: Jacki Cones, MD;  Location: [facility];  Service: Orthopedics;  Laterality: Right;  Right Total Knee Arthroplasty   TUBAL LIGATION  1981    Outpatient Medications Prior to Visit  Medication Sig Dispense Refill   Abatacept (ORENCIA) 125 MG/ML SOSY Inject 125 mg into the skin every Monday.     acetaminophen (TYLENOL) 500 MG tablet Take 1,000 mg by mouth every 6 (six)  hours as needed for moderate pain.     amphetamine-dextroamphetamine (ADDERALL XR) 15 MG 24 hr capsule TAKE ONE CAPSULE BY MOUTH EVERY MORNING 30 capsule 0   cetirizine (ZYRTEC) 10 MG tablet Take 10 mg by mouth daily.     Cholecalciferol (VITAMIN D3 PO) Take 3 capsules by mouth daily.     Colchicine 0.6 MG CAPS Take 1.2 mg by mouth at bedtime.     diazepam (VALIUM) 5 MG tablet TAKE ONE TABLET BY MOUTH EVERY TWELVE HOURS AS NEEDED FOR MIGRAINE HEADACHES (Patient taking differently: Take 2.5 mg by mouth at bedtime.) 60 tablet 5   diclofenac Sodium (VOLTAREN) 1 % GEL Apply 1 application topically 2 (two) times daily. Apply to left knee     folic acid (FOLVITE) 1 MG tablet Take 1 mg by mouth at bedtime.     ibuprofen (  ADVIL) 200 MG tablet Take 400 mg by mouth every 6 (six) hours as needed for moderate pain.     leflunomide (ARAVA) 20 MG tablet Take 20 mg by mouth daily.     lisinopril (ZESTRIL) 20 MG tablet Take 0.5 tablets (10 mg total) by mouth daily. 45 tablet 3   metFORMIN (GLUCOPHAGE-XR) 500 MG 24 hr tablet Take 1-2 tablets (500-1,000 mg total) by mouth 2 (two) times daily with a meal. 360 tablet 3   methotrexate 50 MG/2ML injection Inject 22.5 mg into the muscle every Monday.     metoprolol tartrate (LOPRESSOR) 25 MG tablet Take 0.5 tablets (12.5 mg total) by mouth 2 (two) times daily. 90 tablet 3   omeprazole (PRILOSEC) 40 MG capsule Take 1 capsule (40 mg total) by mouth 2 (two) times daily. 60 capsule 3   phenazopyridine (PYRIDIUM) 95 MG tablet Take 190 mg by mouth 2 (two) times daily as needed for pain.     Polyvinyl Alcohol-Povidone (REFRESH OP) Place 1 drop into both eyes 2 (two) times daily.     predniSONE (DELTASONE) 5 MG tablet Take 1-2 tablets (5-10 mg total) by mouth daily. 180 tablet 3   primidone (MYSOLINE) 50 MG tablet Take 50-100 mg by mouth See admin instructions. Take 1 tablet (50 mg) by mouth in the morning & take 2 tablets (100 mg) by mouth in the evening.     promethazine  (PHENERGAN) 12.5 MG tablet 1-2 tabs po q6h prn nausea 30 tablet 2   rosuvastatin (CRESTOR) 20 MG tablet Take 1 tablet (20 mg total) by mouth daily. (Patient taking differently: Take 20 mg by mouth at bedtime.) 90 tablet 3   Semaglutide,0.25 or 0.5MG /DOS, (OZEMPIC, 0.25 OR 0.5 MG/DOSE,) 2 MG/1.5ML SOPN Inject 0.5 mg into the skin once a week. 3 mL 3   sertraline (ZOLOFT) 100 MG tablet TAKE ONE TABLET BY MOUTH EVERY DAY 90 tablet 3   fluconazole (DIFLUCAN) 100 MG tablet Take 100 mg by mouth daily. (Patient not taking: Reported on 04/13/2021)     No facility-administered medications prior to visit.    Allergies  Allergen Reactions   Steri-Strip Compound Benzoin [Benzoin Compound] Anaphylaxis    Blisters and itching   Penicillins Rash   Pravastatin     Myalgias   Simvastatin     Myalgias   Hydrocodone-Acetaminophen Nausea And Vomiting    ROS As per HPI  PE: Vitals with BMI 04/13/2021 03/31/2021 03/17/2021  Height 5' 8.5" 5' 8.5" -  Weight 143 lbs 3 oz 141 lbs 3 oz -  BMI 21.45 21.15 -  Systolic 117 87 -  Diastolic 77 64 -  Pulse 87 104 69  Gen: Alert, well appearing.  Patient is oriented to person, place, time, and situation. AFFECT: pleasant, lucid thought and speech. No further exam today.  LABS:    Chemistry      Component Value Date/Time   NA 139 03/31/2021 0932   NA 137 11/27/2020 0000   K 4.2 03/31/2021 0932   CL 99 03/31/2021 0932   CO2 25 03/31/2021 0932   BUN 10 03/31/2021 0932   BUN 11 11/27/2020 0000   CREATININE 0.83 03/31/2021 0932   CREATININE 0.82 10/14/2017 1524   GLU 192 11/27/2020 0000      Component Value Date/Time   CALCIUM 8.5 03/31/2021 0932   ALKPHOS 63 03/31/2021 0932   AST 54 (H) 03/31/2021 0932   ALT 39 (H) 03/31/2021 0932   BILITOT 0.6 03/31/2021 0932     Lab  Results  Component Value Date   WBC 6.6 03/31/2021   HGB 11.5 (L) 03/31/2021   HCT 35.8 (L) 03/31/2021   MCV 84.0 03/31/2021   PLT 258.0 03/31/2021   Lab Results  Component  Value Date   HGBA1C 7.7 (A) 02/27/2021   IMPRESSION AND PLAN:  1) Hypotension: resolved --her fluid loss with diarrhea has stopped and she has been off her lisinopril.  Diastolic BPs now back up some into mild htn range but she'll hold off on restart of lisin until systolics consistently climb into upper 130s or so.  2) Shingles: suspected.  See hpi. She is immunosuppressed d/t meds. Start valtrex 1g tid x 7d.  3) DM: control historically has been good but recent preop (TKA surg) check of a1c was up over 8% so they rescheduled her surgery.  Subsequent a1c recheck at Dr. Charlean Sanfilippo 1 week later was 7.7%.  She'll f/u with Dr. Elvera Lennox as appropriate.  An After Visit Summary was printed and given to the patient.  FOLLOW UP: Return in about 3 months (around 07/14/2021) for routine chronic illness f/u.  Signed:  Santiago Bumpers, MD           04/13/2021

## 2021-04-17 ENCOUNTER — Encounter: Payer: Self-pay | Admitting: Family Medicine

## 2021-04-21 ENCOUNTER — Encounter: Payer: Self-pay | Admitting: Family Medicine

## 2021-04-24 ENCOUNTER — Other Ambulatory Visit: Payer: Self-pay | Admitting: Cardiology

## 2021-04-30 ENCOUNTER — Other Ambulatory Visit: Payer: Self-pay

## 2021-04-30 ENCOUNTER — Ambulatory Visit: Payer: 59 | Admitting: Internal Medicine

## 2021-04-30 ENCOUNTER — Ambulatory Visit: Payer: 59 | Attending: Internal Medicine

## 2021-04-30 VITALS — BP 146/82 | HR 107 | Ht 68.5 in | Wt 142.2 lb

## 2021-04-30 DIAGNOSIS — E2749 Other adrenocortical insufficiency: Secondary | ICD-10-CM | POA: Diagnosis not present

## 2021-04-30 DIAGNOSIS — E785 Hyperlipidemia, unspecified: Secondary | ICD-10-CM

## 2021-04-30 DIAGNOSIS — E1165 Type 2 diabetes mellitus with hyperglycemia: Secondary | ICD-10-CM | POA: Diagnosis not present

## 2021-04-30 DIAGNOSIS — E041 Nontoxic single thyroid nodule: Secondary | ICD-10-CM

## 2021-04-30 DIAGNOSIS — Z23 Encounter for immunization: Secondary | ICD-10-CM

## 2021-04-30 DIAGNOSIS — E663 Overweight: Secondary | ICD-10-CM

## 2021-04-30 LAB — POCT GLYCOSYLATED HEMOGLOBIN (HGB A1C): Hemoglobin A1C: 5.6 % (ref 4.0–5.6)

## 2021-04-30 MED ORDER — METFORMIN HCL ER 500 MG PO TB24: 1000.0000 mg | ORAL_TABLET | Freq: Two times a day (BID) | ORAL | 3 refills | Status: DC

## 2021-04-30 NOTE — Patient Instructions (Addendum)
We will need a thyroid U/S.  Please continue: - Metformin ER 1000 mg 2x daily  Stop: - Ozempic    Regarding Prednisone: - You absolutely need to take this medication every day and not skip doses. - Please double the dose if you have a fever, for the duration of the fever. - If you cannot take anything by mouth (vomiting) or you have severe diarrhea so that you eliminate the prednisone pills in your stool, please make sure that you get steroids in the vein instead - go to the nearest emergency department/urgent care or you may go to your PCPs office  - Please try to get a MedAlert bracelet or pendant indicating: "Adrenal insufficiency".   Please come back for a follow-up appointment in 3-4 months.  You are cleared for surgery from the diabetes point of view (HbA1c 5.6%).

## 2021-04-30 NOTE — Progress Notes (Addendum)
Patient ID: Raven [name], female   DOB: 10-04-1955, 65 y.o.   MRN: [id number]  This visit occurred during the SARS-CoV-2 public health emergency.  Safety protocols were in place, including screening questions prior to the visit, additional usage of staff PPE, and extensive cleaning of exam room while observing appropriate contact time as indicated for disinfecting solutions.   HPI: Raven [name] is a 65 y.o.-year-old female, returning for f/u for DM2, dx ~1995, insulin-dependent since 11/2013 - now insulin-independent, controlled, without long-term complications. Last visit 2 months ago.  Interim history: Before last visit, she was planning to have TKR, but the surgery was canceled due to high HbA1c (8.3%, but then improved to 7.7%).  She has TKR planned for 05/2021. When I last saw her, in an effort to improve her blood sugars, she was off steroids completely after many years of taking prednisone daily.  She was feeling terrible, with nausea, weakness, decreased appetite, weight loss - "I feel like I'm dying". We restarted prednisone right away. At this visit, she feels much better. She has indigestion. She presented to the emergency room with dysphagia.  EGD showed distal benign esophageal stenosis. She had 2 strictures dilated.  Reviewed HbA1c levels: Lab Results  Component Value Date   HGBA1C 7.7 (A) 02/27/2021   HGBA1C 8.3 (H) 02/19/2021   HGBA1C 6.8 (A) 11/27/2020   She has RA >> was on Simponi (changed from Remicade) and MTX >> started Orencia and stays on MTX. On Plaquenil.  On prednisone 5 mg daily.  Pt is on a regimen of: - Metformin ER 500 mg >> [phone] mg 2x a day - Ozempic 0.5 mg weekly - started 12/2018 -she had initially nausea, now indigestion and decreased appetite Previously on Levemir 14 >> 10 >> 4-6 units at bedtime >> stopped 07/2019 She tried Glimepiride in the past >> fluctuating blood sugar. We stopped glipizide and Januvia only started Ozempic  12/2018. She had a CGM in the past but could not afford it anymore.  She checks sugars 2-3x a day per review of her log: - am: 101-114 >> 104-120 >> 101-138 >> 122-172 >> 105-144 (mostly 120s) - 2h after b'fast: 120-140 >> n/c >> 143, 160 >> 140s - before lunch: n/c >> 110-120 >> n/c - 2h after lunch: n/c >> 140-154 >> n/c >> 153 >> 140s - before dinner: 130-150 >> 150 >> n/c >> 124, 151 - 2h after dinner:  168-188 >> 166, 186, 220 (steroids) >> 140s, 188 (icecream) - bedtime: 110-120 >> n/c   - nighttime: n/c >> 100-150 >> n/c >> 151 Lowest sugar was 52 (x2 -Glipizide) ...>> 122 >> 105;  she has hypoglycemia awareness in the 70s. Highest sugar was 324 ...>> 220 >> 188  Meter: ReliOn.  Pt's meals are: - Breakfast: scrambled eggs + bacon + tomatoes - Lunch: meat + 2 veggies + no bread unless burger - Dinner: meat + 2 veggies - Snacks: 2: nuts; cheese;   -No CKD, last BUN/creatinine:  04/13/2021: 12/0.76, GFR 87, glucose 166 Lab Results  Component Value Date   BUN 10 03/31/2021   CREATININE 0.83 03/31/2021  On lisinopril 10.  -+ HL; last set of lipids: Lab Results  Component Value Date   CHOL 108 03/31/2021   HDL 32.20 (L) 03/31/2021   LDLCALC 65 09/22/2020   LDLDIRECT 47.0 03/31/2021   TRIG 209.0 (H) 03/31/2021   CHOLHDL 3 03/31/2021  She was taken off statins in the past due to transaminitis in 10/2013.  Atorvastatin caused muscle cramps.  Currently on Crestor 10 and fish oil.  Latest LFTs reviewed and they were still elevated.   - last eye exam 04/2021: No DR reportedly.  She had cataract sx's 05/2018. Dr. Charise Killian.  - Denies numbness and tingling in her feet.  Started on Omeprazole for choking >> resolved.  She had an esophageal dilation 03/2020. She has steroid injections in knees - had 1 TKR and will need to have another one. She was dx'ed with PSVT after a syncopal episode at church at the beginning of 2022. On Metoprolol now.  Since last visit, she has a CT neck  and soft tissue (03/13/2021) showing a 1 cm right thyroid nodule with calcification.  Pt denies: - feeling nodules in neck - hoarseness - dysphagia after Es dilation - choking  + FH of thyroid nodule in mother >> benign. No FH of ThyCA.  ROS: + See HPI + Tremors, + joint pain  I reviewed pt's medications, allergies, PMH, social hx, family hx, and changes were documented in the history of present illness. Otherwise, unchanged from my initial visit note.  Past Medical History:  Diagnosis Date   Acute medial meniscus tear of right knee    Anemia of chronic disease    Mild, Hb stable consistently   Anxiety and depression    Chest pain 10/2017   +Cardiac CT.  Myocardial perfusion imaging NORMAL, EF>65%   Diabetes mellitus 1995   Managed by Dr. Elvera Lennox (Endo)   GERD (gastroesophageal reflux disease) per pt watches diet and take tums as needed   with hx of prox esoph stricture + dilation procedure   H/O hiatal hernia    slightly larger ("moderate" size) on CT angio done to r/o PE 10/2017. noted on egd again 03/2021   History of adrenal insufficiency    in 2022 when she tried coming off her chronic daily prednisone for her RA   Hyperlipidemia    Hypertension    Interstitial cystitis    Lichen sclerosus of female genitalia    Migraine    Osteoarthritis, multiple sites    Palpitations 10/2017   3 wk event monitor: symptoms correlate with sinus rhythm with PACs, at times runs of PACs up to 5 beats.   Pseudogout of knee, left    colchicine helpful   Psoriasis    Seasonal allergies    Seropositive rheumatoid arthritis (HCC)    Hands, knees, jaw, elbow: responding to Simponi as of 09/29/15 rheum f/u.  Waning effectiveness on 03/2016 f/u so pt switched to Orencia injections.  Doing well on chronic low-dose prednisone therapy+ methotrexate +orencia as of 01/2019, 08/2019, 11/2019    Past Surgical History:  Procedure Laterality Date   ABDOMINAL HYSTERECTOMY  1991   for fibroids; ovaries  still in   BIOPSY  03/17/2021   Procedure: BIOPSY;  Surgeon: Kathi Der, MD;  Location: [facility];  Service: Gastroenterology;;   BREAST BIOPSY  2001; 02/12/16   fibrocystic breast disease in 2001 and again in 02/2016 (+scar from prev bx site w/calcifications).   CARDIOVASCULAR STRESS TEST  11/01/2017   Myocard perf imaging: NORMAL (EF >60-65%)   COLONOSCOPY  09/12/2013; 02/25/20   Normal 2015 and 2021: Recall 5 yrs ([facility], Dr. Evette Cristal) due to FH of colon polyps.   CYSTOSCOPY  2004   for chronic UTI   DEXA  10/2016   Bone density normal (T-score 0.0)   ESOPHAGOGASTRODUODENOSCOPY  04/19/2011   Nl except antral gastritis: bx = mild chron gastritis (  H pyloria neg)    ESOPHAGOGASTRODUODENOSCOPY (EGD) WITH PROPOFOL N/A 03/27/2020   Esoph stenosis-->dilated.  Mod size hiatal hernia ([facility] Hea Spec). Procedure: ESOPHAGOGASTRODUODENOSCOPY (EGD) WITH PROPOFOL ;  Surgeon: Graylin Shiver, MD;  Location: [facility];  Service: Endoscopy;  Laterality: N/A;   ESOPHAGOGASTRODUODENOSCOPY (EGD) WITH PROPOFOL N/A 03/17/2021   Esoph stricture->dilated.  Large hiatal hernia. Also +esoph candidiasis, bx inflammation.  Procedure: ESOPHAGOGASTRODUODENOSCOPY (EGD) WITH PROPOFOL;  Surgeon: Kathi Der, MD;  Location: [facility];  Service: Gastroenterology;  Laterality: N/A;   EVENT MONITOR  10/2017   3 wk-->Symptoms correlate with sinus rhythm with PACs, at times runs of PACs up to 5 beats.   GALLBLADDER SURGERY  2019   intraarticular steroid injection  2013   3 R knee & L X 1; Dr Netta Corrigan   KNEE ARTHROCENTESIS  2013   R knee x 3 & L X 1   KNEE ARTHROSCOPY  10/21/2011   Procedure: ARTHROSCOPY KNEE;  Surgeon: Jacki Cones, MD;  Location: [facility];  Service: Orthopedics;  Laterality: Right;  WITH MEDIAL and lateral shaving of femoral chondyl  with microfracture technique of lateral and medial femoral chondyl suprapatellar synovectomy   LAPAROSCOPIC CHOLECYSTECTOMY  01/2018    SAVORY DILATION N/A 03/27/2020   Procedure: SAVORY DILATION;  Surgeon: Graylin Shiver, MD;  Location: [facility];  Service: Endoscopy;  Laterality: N/A;   TOTAL KNEE ARTHROPLASTY  04/12/2012   RIGHT  Procedure: TOTAL KNEE ARTHROPLASTY;  Surgeon: Jacki Cones, MD;  Location: [facility];  Service: Orthopedics;  Laterality: Right;  Right Total Knee Arthroplasty   TUBAL LIGATION  1981    Social History   Socioeconomic History   Marital status: Married    Spouse name: Not on file   Number of children: Not on file   Years of education: Not on file   Highest education level: Not on file  Occupational History   Not on file  Tobacco Use   Smoking status: Never   Smokeless tobacco: Never  Vaping Use   Vaping Use: Never used  Substance and Sexual Activity   Alcohol use: No   Drug use: No   Sexual activity: Not on file  Other Topics Concern   Not on file  Social History Narrative   Married, 3 children (2 in [place], 1 in [place]).  4 grandchildren.   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)   No tob/alc/drugs.   2 cups of coffee/day x 3 months   Regular exercise- no-due to arthritis.   Religion affecting care, "it allows stress management:"   Social Determinants of Health   Financial Resource Strain: Not on file  Food Insecurity: Not on file  Transportation Needs: Not on file  Physical Activity: Not on file  Stress: Not on file  Social Connections: Not on file  Intimate Partner Violence: Not on file    Current Outpatient Medications on File Prior to Visit  Medication Sig Dispense Refill   Abatacept (ORENCIA) 125 MG/ML SOSY Inject 125 mg into the skin every Monday.     acetaminophen (TYLENOL) 500 MG tablet Take 1,000 mg by mouth every 6 (six) hours as needed for moderate pain.     amphetamine-dextroamphetamine (ADDERALL XR) 15 MG 24 hr capsule TAKE ONE CAPSULE BY MOUTH EVERY MORNING 30 capsule 0   cetirizine (ZYRTEC) 10 MG tablet Take 10 mg by mouth daily.      Cholecalciferol (VITAMIN D3 PO) Take 3 capsules by mouth daily.     Colchicine 0.6  MG CAPS Take 1.2 mg by mouth at bedtime.     diazepam (VALIUM) 5 MG tablet TAKE ONE TABLET BY MOUTH EVERY TWELVE HOURS AS NEEDED FOR MIGRAINE HEADACHES (Patient taking differently: Take 2.5 mg by mouth at bedtime.) 60 tablet 5   diclofenac Sodium (VOLTAREN) 1 % GEL Apply 1 application topically 2 (two) times daily. Apply to left knee     folic acid (FOLVITE) 1 MG tablet Take 1 mg by mouth at bedtime.     ibuprofen (ADVIL) 200 MG tablet Take 400 mg by mouth every 6 (six) hours as needed for moderate pain.     leflunomide (ARAVA) 20 MG tablet Take 20 mg by mouth daily.     lisinopril (ZESTRIL) 20 MG tablet Take 0.5 tablets (10 mg total) by mouth daily. 45 tablet 3   metFORMIN (GLUCOPHAGE-XR) 500 MG 24 hr tablet Take 1-2 tablets (500-1,000 mg total) by mouth 2 (two) times daily with a meal. 360 tablet 3   methotrexate 50 MG/2ML injection Inject 22.5 mg into the muscle every Monday.     metoprolol tartrate (LOPRESSOR) 25 MG tablet Take 0.5 tablets (12.5 mg total) by mouth 2 (two) times daily. 90 tablet 3   omeprazole (PRILOSEC) 40 MG capsule Take 1 capsule (40 mg total) by mouth 2 (two) times daily. 60 capsule 3   phenazopyridine (PYRIDIUM) 95 MG tablet Take 190 mg by mouth 2 (two) times daily as needed for pain.     Polyvinyl Alcohol-Povidone (REFRESH OP) Place 1 drop into both eyes 2 (two) times daily.     predniSONE (DELTASONE) 5 MG tablet Take 1-2 tablets (5-10 mg total) by mouth daily. 180 tablet 3   primidone (MYSOLINE) 50 MG tablet Take 50-100 mg by mouth See admin instructions. Take 1 tablet (50 mg) by mouth in the morning & take 2 tablets (100 mg) by mouth in the evening.     promethazine (PHENERGAN) 12.5 MG tablet 1-2 tabs po q6h prn nausea 30 tablet 2   rosuvastatin (CRESTOR) 20 MG tablet Take 1 tablet (20 mg total) by mouth daily. (Patient taking differently: Take 20 mg by mouth at bedtime.) 90 tablet 3    Semaglutide,0.25 or 0.5MG /DOS, (OZEMPIC, 0.25 OR 0.5 MG/DOSE,) 2 MG/1.5ML SOPN Inject 0.5 mg into the skin once a week. 3 mL 3   sertraline (ZOLOFT) 100 MG tablet TAKE ONE TABLET BY MOUTH EVERY DAY 90 tablet 3   valACYclovir (VALTREX) 1000 MG tablet 1 tab po tid x 7d 21 tablet 0   No current facility-administered medications on file prior to visit.    Allergies  Allergen Reactions   Steri-Strip Compound Benzoin [Benzoin Compound] Anaphylaxis    Blisters and itching   Penicillins Rash   Pravastatin     Myalgias   Simvastatin     Myalgias   Hydrocodone-Acetaminophen Nausea And Vomiting    Family History  Problem Relation Age of Onset   Diabetes Mother    Stroke Mother 75   Osteoarthritis Mother    Stroke Brother 2   Heart disease Father        CABG   Lung cancer Father    Prostate cancer Father    Pancreatic cancer Father    Pancreatic cancer Paternal Grandfather    Bipolar disorder Daughter    Anxiety disorder Son    Arthritis Maternal Grandmother        rheumatoid   Heart attack Brother 35       smoker   PE: BP (!) 146/82 (  BP Location: Left Arm, Patient Position: Sitting, Cuff Size: Normal)   Pulse (!) 107   Ht 5' 8.5" (1.74 m)   Wt 142 lb 3.2 oz (64.5 kg)   SpO2 96%   BMI 21.31 kg/m  Body mass index is 21.31 kg/m.  Wt Readings from Last 3 Encounters:  04/30/21 142 lb 3.2 oz (64.5 kg)  04/13/21 143 lb 3.2 oz (65 kg)  03/31/21 141 lb 3.2 oz (64 kg)   Constitutional: normal  weight Eyes: PERRLA, EOMI, no exophthalmos ENT: moist mucous membranes, no thyromegaly, no cervical lymphadenopathy Cardiovascular: tachycardia, RR, No MRG Respiratory: CTA B Gastrointestinal: abdomen soft, NT, ND, BS+ Musculoskeletal: no deformities, strength intact in all 4 Skin: moist, warm, no rashes Neurological: no tremor with outstretched hands, DTR normal in all 4  ASSESSMENT: 1. DM2, insulin-independent, uncontrolled, without long term complications, but with Hgly -She was  on the freestyle libre CGM in the past but cannot afford it anymore.  2. HL  3.  H/o Overweight  4.  Central adrenal insufficiency  5.  Right thyroid nodule  PLAN:  1. Patient with longstanding, uncontrolled diabetes, with improved control after adding the GLP-1 receptor agonist to her metformin ER regimen.  We could not use a higher dose of Ozempic due to diarrhea.  Her sugars improved after adding this, so she could come off insulin. -However, before last visit, sugars started to increase especially in the setting of prednisone tapers.  HbA1c was as high as 8.3%, but it started to improve before last visit, to 7.7%.  At that time, sugars were still above target at most times of the day, but definitely improving.  We increased her metformin to 1000 mg twice a day and continued Ozempic.  I did advise her to let me know if the sugars do not improve, in which case, we plans to add long-acting insulin.  At that time, I cleared her for knee surgery from the diabetes point of view. -At today's visit, sugars are almost all at goal.  No low blood sugars.  She complains of indigestion and decreased appetite.  At this point, since her sugars are so well controlled and HbA1c is much improved from before, we can try to stop Ozempic and continue only with metformin. - I suggested to:  Patient Instructions  Please continue: - Metformin ER 1000 mg 2x daily  Stop: - Ozempic    Regarding Prednisone: - You absolutely need to take this medication every day and not skip doses. - Please double the dose if you have a fever, for the duration of the fever. - If you cannot take anything by mouth (vomiting) or you have severe diarrhea so that you eliminate the prednisone pills in your stool, please make sure that you get steroids in the vein instead - go to the nearest emergency department/urgent care or you may go to your PCPs office  - Please try to get a MedAlert bracelet or pendant indicating: "Adrenal  insufficiency".   Please come back for a follow-up appointment in 3-4 months.  - we checked her HbA1c: 5.6% (lower) - advised to check sugars at different times of the day - 1x a day, rotating check times - advised for yearly eye exams >> she is UTD - return to clinic in 3 months   2. HL -Reviewed latest lipid panel from 03/2021: LDL at goal, triglycerides high, HDL low: Lab Results  Component Value Date   CHOL 108 03/31/2021   HDL 32.20 (L) 03/31/2021  LDLCALC 65 09/22/2020   LDLDIRECT 47.0 03/31/2021   TRIG 209.0 (H) 03/31/2021   CHOLHDL 3 03/31/2021  -Continues Crestor 10 mg daily and fish oil, without signs  3.  H/o Overweight -We will continue Ozempic which should also help with weight loss -After starting Ozempic she was able to lose more than 40 pounds, but weight stabilized afterwards -Before last visit she lost 14 pounds, but most likely due to adrenal insufficiency after coming off prednisone -Weight is approximately stable now  4.  Central adrenal insufficiency  -Iatrogenic, due to steroid treatment for RA -Patient has had RAI for many years, previously on 5 mg of prednisone daily and also intermittent prednisone tapers, but she stopped prednisone completely before last visit in an effort to manage her diabetes.  She started to feel very poorly, with weight loss, decreased appetite, weakness, and overall, feeling terrible.  We restarted prednisone right away. -At last visit and again today we discussed about sick day rules to manage adrenal insufficiency -She did not have to increase the dose of steroids since last visit (except for few days after our last visit because she had diarrhea) or obtain the medication intravenously or intramuscularly  5.  Right thyroid nodule -Incidentally found on CT of the neck and soft tissues (03/13/2021): 1 cm R thyroid nodule with calcification. -No neck compression symptoms -We will check a thyroid ultrasound now -discussed that the  vast majority of these nodules are benign, but if there is any indication that this may be cancerous, we will proceed with nodule biopsy.  She agrees with this. -Most recent TSH was reviewed and this was normal:  Lab Results  Component Value Date   TSH 1.37 11/09/2019   Thyroid U/S (05/11/2021): Parenchymal Echotexture: Mildly heterogenous  Isthmus: 0.4 cm  Right lobe: 4.6 x 1.9 x 1.7 cm  Left lobe: 3.0 x 1.0 x 1.3 cm  _____________________________________________________________________   Nodule # 1:  Location: Isthmus  Maximum size: 0.5 cm; Other 2 dimensions: 0.4 x 0.3 cm  Composition: solid/almost completely solid (2)  Echogenicity: hypoechoic (2)    Given size (<0.9 cm) and appearance, this nodule does NOT meet TI-RADS criteria for biopsy or dedicated follow-up.  ________________________________________________________   Nodule # 2:  Location: Right; Mid  Maximum size: 1.5 cm; Other 2 dimensions: 1.3 x 1.4 cm  Composition: solid/almost completely solid (2)  Echogenicity: hyperechoic (1)  Echogenic foci: macrocalcifications (1)    **Given size (>/= 1.5 cm) and appearance, fine needle aspiration of this moderately suspicious nodule should be considered based on TI-RADS criteria.   Nodule # 3:  Location: Left; Mid  Maximum size: 0.5 cm; Other 2 dimensions: 0.4 x 0.4 cm  Composition: solid/almost completely solid (2)  Echogenicity: hypoechoic (2)   Given size (<0.9 cm) and appearance, this nodule does NOT meet TI-RADS criteria for biopsy or dedicated follow-up.  _________________________________________________________   Small nonenlarged bilateral cervical lymph nodes demonstrate normal lymph node architecture.   IMPRESSION: 1. 1.5 cm right mid thyroid nodule (nodule 2) just meets criteria for fine-needle aspiration. 2. 0.5 cm isthmus and 0.5 cm left thyroid nodules do not meet criteria for biopsy or dedicated follow-up.   The above is in keeping with the ACR  TI-RADS recommendations - J Am Coll Radiol 2017;14:587-595.  The right mid thyroid nodule meets criteria for FNA. I will discuss with the patient.   Carlus Pavlov, MD PhD [facility] Endocrinology

## 2021-04-30 NOTE — Progress Notes (Signed)
   Covid-19 Vaccination Clinic  Name:  Raven [name]    MRN: [id number] DOB: 1956/03/22  04/30/2021  Ms. [name] was observed post Covid-19 immunization for 15 minutes without incident. She was provided with Vaccine Information Sheet and instruction to access the V-Safe system.   Ms. [name] was instructed to call 911 with any severe reactions post vaccine: Difficulty breathing  Swelling of face and throat  A fast heartbeat  A bad rash all over body  Dizziness and weakness   Immunizations Administered     Name Date Dose VIS Date Route   Pfizer Covid-19 Vaccine Bivalent Booster 04/30/2021  9:05 AM 0.3 mL 03/11/2021 Intramuscular   Manufacturer: [employer], [employer]   Lot: [id number]   NDC: [phone]

## 2021-05-08 ENCOUNTER — Encounter: Payer: Self-pay | Admitting: Internal Medicine

## 2021-05-11 ENCOUNTER — Other Ambulatory Visit: Payer: Self-pay

## 2021-05-11 ENCOUNTER — Ambulatory Visit
Admission: RE | Admit: 2021-05-11 | Discharge: 2021-05-11 | Disposition: A | Discharge status: Home / Self Care | Payer: 59 | Source: Ambulatory Visit | Attending: Internal Medicine | Admitting: Internal Medicine

## 2021-05-11 DIAGNOSIS — E041 Nontoxic single thyroid nodule: Secondary | ICD-10-CM

## 2021-05-12 ENCOUNTER — Other Ambulatory Visit: Payer: Self-pay | Admitting: Family Medicine

## 2021-05-13 NOTE — Telephone Encounter (Signed)
Requesting: adderall Contract: 04/13/21 UDS: 03/21/20 Last Visit:04/13/21 Next Visit: 07/15/21 Last Refill:03/31/21(30,0)  Please Advise. Med pending

## 2021-05-15 NOTE — Patient Instructions (Addendum)
DUE TO COVID-19 ONLY ONE VISITOR IS ALLOWED TO COME WITH YOU AND STAY IN THE WAITING ROOM ONLY DURING PRE OP AND PROCEDURE DAY OF SURGERY IF YOU ARE GOING HOME AFTER SURGERY. IF YOU ARE SPENDING THE NIGHT 2 PEOPLE MAY VISIT WITH YOU IN YOUR PRIVATE ROOM AFTER SURGERY UNTIL VISITING  HOURS ARE OVER AT 8:00 PM AND 1 VISITOR CAN SPEND THE NIGHT.   YOU NEED TO HAVE A COVID 19 TEST ON__11/11___ THIS TEST MUST BE DONE BEFORE SURGERY,  COVID TESTING SITE  IS LOCATED AT [address], . REMAIN IN YOUR CAR THIS IS A DRIVE UP TEST. AFTER YOUR COVID TEST PLEASE WEAR A MASK OUT IN PUBLIC AND SOCIAL DISTANCE AND WASH YOUR HANDS FREQUENTLY, ALSO ASK ALL YOUR CLOSE CONTACT PERSONS TO WEAR A MASK AND SOCIAL DISTANCE AND WASH THEIR HANDS FREQUENTLY ALSO.               Raven [name]     Your procedure is scheduled on: 05/26/21   Report to [facility] Main  Entrance   Report to admitting at 7:20 AM     Call this number if you have problems the morning of surgery [phone]   No food after midnight.    You may have clear liquid until 7:00 AM.    At 6:30 AM drink pre surgery drink.   Nothing by mouth after 7:00 AM.    CLEAR LIQUID DIET   Foods Allowed                                                                     Foods Excluded                                                                                               liquids that you cannot  Plain Jell-O any favor except red or purple                                           see through such as: Fruit ices (not with fruit pulp)                                     milk, soups, orange juice  Iced Popsicles                                    All solid food Carbonated beverages, regular and diet  Cranberry, grape and apple juices Sports drinks like Gatorade Lightly seasoned clear broth or consume(fat free) Sugar    BRUSH YOUR TEETH MORNING OF SURGERY AND RINSE YOUR MOUTH  OUT, NO CHEWING GUM CANDY OR MINTS.     Take these medicines the morning of surgery with A SIP OF WATER: Primidone, Sertraline, Metoprolol, Prednisone, Omeprazole  DO NOT TAKE ANY DIABETIC MEDICATIONS DAY OF YOUR SURGERY How to Manage Your Diabetes Before and After Surgery  Why is it important to control my blood sugar before and after surgery? Improving blood sugar levels before and after surgery helps healing and can limit problems. A way of improving blood sugar control is eating a healthy diet by:  Eating less sugar and carbohydrates  Increasing activity/exercise  Talking with your doctor about reaching your blood sugar goals High blood sugars (greater than 180 mg/dL) can raise your risk of infections and slow your recovery, so you will need to focus on controlling your diabetes during the weeks before surgery. Make sure that the doctor who takes care of your diabetes knows about your planned surgery including the date and location.  How do I manage my blood sugar before surgery? Check your blood sugar at least 4 times a day, starting 2 days before surgery, to make sure that the level is not too high or low. Check your blood sugar the morning of your surgery when you wake up and every 2 hours until you get to the Short Stay unit. If your blood sugar is less than 70 mg/dL, you will need to treat for low blood sugar: Do not take insulin. Treat a low blood sugar (less than 70 mg/dL) with  cup of clear juice (cranberry or apple), 4 glucose tablets, OR glucose gel. Recheck blood sugar in 15 minutes after treatment (to make sure it is greater than 70 mg/dL). If your blood sugar is not greater than 70 mg/dL on recheck, call [phone] for further instructions. Report your blood sugar to the short stay nurse when you get to Short Stay.  If you are admitted to the hospital after surgery: Your blood sugar will be checked by the staff and you will probably be given insulin after surgery  (instead of oral diabetes medicines) to make sure you have good blood sugar levels. The goal for blood sugar control after surgery is 80-180 mg/dL.   WHAT DO I DO ABOUT MY DIABETES MEDICATION?  Do not take oral diabetes medicines (pills) the morning of surgery.                                    You may not have any metal on your body including hair pins and              piercings  Do not wear jewelry, make-up, lotions, powders or perfumes, deodorant             Do not wear nail polish on your fingernails.  Do not shave  48 hours prior to surgery.            Do not bring valuables to the hospital. [facility] IS NOT             RESPONSIBLE   FOR VALUABLES.  Contacts, dentures or bridgework may not be worn into surgery.  Leave suitcase in the car. After surgery it may be brought to your room.  Please read over the following fact sheets you were given: _____________________________________________________________________             [facility] - Preparing for Surgery Before surgery, you can play an important role.  Because skin is not sterile, your skin needs to be as free of germs as possible.  You can reduce the number of germs on your skin by washing with CHG (chlorahexidine gluconate) soap before surgery.  CHG is an antiseptic cleaner which kills germs and bonds with the skin to continue killing germs even after washing. Please DO NOT use if you have an allergy to CHG or antibacterial soaps.  If your skin becomes reddened/irritated stop using the CHG and inform your nurse when you arrive at Short Stay. Do not shave (including legs and underarms) for at least 48 hours prior to the first CHG shower.  Please follow these instructions carefully:  1.  Shower with CHG Soap the night before surgery and the  morning of Surgery.  2.  If you choose to wash your hair, wash your hair first as usual with your  normal  shampoo.  3.  After you shampoo, rinse your hair and body  thoroughly to remove the  shampoo.                            4.  Use CHG as you would any other liquid soap.  You can apply chg directly  to the skin and wash                       Gently with a scrungie or clean washcloth.  5.  Apply the CHG Soap to your body ONLY FROM THE NECK DOWN.   Do not use on face/ open                           Wound or open sores. Avoid contact with eyes, ears mouth and genitals (private parts).                       Wash face,  Genitals (private parts) with your normal soap.             6.  Wash thoroughly, paying special attention to the area where your surgery  will be performed.  7.  Thoroughly rinse your body with warm water from the neck down.  8.  DO NOT shower/wash with your normal soap after using and rinsing off  the CHG Soap.                9.  Pat yourself dry with a clean towel.            10.  Wear clean pajamas.            11.  Place clean sheets on your bed the night of your first shower and do not  sleep with pets. Day of Surgery : Do not apply any lotions/deodorants the morning of surgery.  Please wear clean clothes to the hospital/surgery center.  FAILURE TO FOLLOW THESE INSTRUCTIONS MAY RESULT IN THE CANCELLATION OF YOUR SURGERY PATIENT SIGNATURE_________________________________  NURSE SIGNATURE__________________________________  ________________________________________________________________________   Rogelia Mire  An incentive spirometer is a tool that can help keep your lungs clear and active. This tool measures how well you are filling your lungs with each breath. Taking long deep breaths may help  reverse or decrease the chance of developing breathing (pulmonary) problems (especially infection) following: A long period of time when you are unable to move or be active. BEFORE THE PROCEDURE  If the spirometer includes an indicator to show your best effort, your nurse or respiratory therapist will set it to a desired goal. If  possible, sit up straight or lean slightly forward. Try not to slouch. Hold the incentive spirometer in an upright position. INSTRUCTIONS FOR USE  Sit on the edge of your bed if possible, or sit up as far as you can in bed or on a chair. Hold the incentive spirometer in an upright position. Breathe out normally. Place the mouthpiece in your mouth and seal your lips tightly around it. Breathe in slowly and as deeply as possible, raising the piston or the ball toward the top of the column. Hold your breath for 3-5 seconds or for as long as possible. Allow the piston or ball to fall to the bottom of the column. Remove the mouthpiece from your mouth and breathe out normally. Rest for a few seconds and repeat Steps 1 through 7 at least 10 times every 1-2 hours when you are awake. Take your time and take a few normal breaths between deep breaths. The spirometer may include an indicator to show your best effort. Use the indicator as a goal to work toward during each repetition. After each set of 10 deep breaths, practice coughing to be sure your lungs are clear. If you have an incision (the cut made at the time of surgery), support your incision when coughing by placing a pillow or rolled up towels firmly against it. Once you are able to get out of bed, walk around indoors and cough well. You may stop using the incentive spirometer when instructed by your caregiver.  RISKS AND COMPLICATIONS Take your time so you do not get dizzy or light-headed. If you are in pain, you may need to take or ask for pain medication before doing incentive spirometry. It is harder to take a deep breath if you are having pain. AFTER USE Rest and breathe slowly and easily. It can be helpful to keep track of a log of your progress. Your caregiver can provide you with a simple table to help with this. If you are using the spirometer at home, follow these instructions: SEEK MEDICAL CARE IF:  You are having difficultly using the  spirometer. You have trouble using the spirometer as often as instructed. Your pain medication is not giving enough relief while using the spirometer. You develop fever of 100.5 F (38.1 C) or higher. SEEK IMMEDIATE MEDICAL CARE IF:  You cough up bloody sputum that had not been present before. You develop fever of 102 F (38.9 C) or greater. You develop worsening pain at or near the incision site. MAKE SURE YOU:  Understand these instructions. Will watch your condition. Will get help right away if you are not doing well or get worse. Document Released: 11/08/2006 Document Revised: 09/20/2011 Document Reviewed: 01/09/2007 [facility] Patient Information 2014 [place], [place].   ________________________________________________________________________

## 2021-05-18 ENCOUNTER — Encounter (HOSPITAL_COMMUNITY)
Admission: RE | Admit: 2021-05-18 | Discharge: 2021-05-18 | Disposition: A | Discharge status: Home / Self Care | Payer: 59 | Source: Ambulatory Visit | Attending: Orthopedic Surgery | Admitting: Orthopedic Surgery

## 2021-05-18 ENCOUNTER — Other Ambulatory Visit: Payer: Self-pay

## 2021-05-18 ENCOUNTER — Encounter (HOSPITAL_COMMUNITY): Payer: Self-pay

## 2021-05-18 VITALS — BP 134/78 | HR 88 | Temp 98.4°F | Resp 18 | Ht 68.5 in | Wt 142.2 lb

## 2021-05-18 DIAGNOSIS — E1165 Type 2 diabetes mellitus with hyperglycemia: Secondary | ICD-10-CM | POA: Insufficient documentation

## 2021-05-18 DIAGNOSIS — K219 Gastro-esophageal reflux disease without esophagitis: Secondary | ICD-10-CM | POA: Insufficient documentation

## 2021-05-18 DIAGNOSIS — Z01818 Encounter for other preprocedural examination: Secondary | ICD-10-CM | POA: Insufficient documentation

## 2021-05-18 DIAGNOSIS — T380X5S Adverse effect of glucocorticoids and synthetic analogues, sequela: Secondary | ICD-10-CM | POA: Insufficient documentation

## 2021-05-18 DIAGNOSIS — M170 Bilateral primary osteoarthritis of knee: Secondary | ICD-10-CM | POA: Insufficient documentation

## 2021-05-18 DIAGNOSIS — I10 Essential (primary) hypertension: Secondary | ICD-10-CM | POA: Diagnosis not present

## 2021-05-18 DIAGNOSIS — E273 Drug-induced adrenocortical insufficiency: Secondary | ICD-10-CM | POA: Insufficient documentation

## 2021-05-18 DIAGNOSIS — M069 Rheumatoid arthritis, unspecified: Secondary | ICD-10-CM | POA: Diagnosis not present

## 2021-05-18 LAB — CBC
HCT: 32.5 % — ABNORMAL LOW (ref 36.0–46.0)
Hemoglobin: 10.3 g/dL — ABNORMAL LOW (ref 12.0–15.0)
MCH: 27.5 pg (ref 26.0–34.0)
MCHC: 31.7 g/dL (ref 30.0–36.0)
MCV: 86.7 fL (ref 80.0–100.0)
Platelets: 238 10*3/uL (ref 150–400)
RBC: 3.75 MIL/uL — ABNORMAL LOW (ref 3.87–5.11)
RDW: 13.9 % (ref 11.5–15.5)
WBC: 5.1 10*3/uL (ref 4.0–10.5)
nRBC: 0 % (ref 0.0–0.2)

## 2021-05-18 LAB — COMPREHENSIVE METABOLIC PANEL
ALT: 27 U/L (ref 0–44)
AST: 31 U/L (ref 15–41)
Albumin: 3.5 g/dL (ref 3.5–5.0)
Alkaline Phosphatase: 53 U/L (ref 38–126)
Anion gap: 7 (ref 5–15)
BUN: 17 mg/dL (ref 8–23)
CO2: 26 mmol/L (ref 22–32)
Calcium: 8.7 mg/dL — ABNORMAL LOW (ref 8.9–10.3)
Chloride: 106 mmol/L (ref 98–111)
Creatinine, Ser: 0.99 mg/dL (ref 0.44–1.00)
GFR, Estimated: >60 mL/min (ref >60)
Glucose, Bld: 219 mg/dL — ABNORMAL HIGH (ref 70–99)
Potassium: 4.3 mmol/L (ref 3.5–5.1)
Sodium: 139 mmol/L (ref 135–145)
Total Bilirubin: 0.4 mg/dL (ref 0.3–1.2)
Total Protein: 6.9 g/dL (ref 6.5–8.1)

## 2021-05-18 LAB — SURGICAL PCR SCREEN
MRSA, PCR: NEGATIVE
Staphylococcus aureus: NEGATIVE

## 2021-05-18 LAB — GLUCOSE, CAPILLARY: Glucose-Capillary: 213 mg/dL — ABNORMAL HIGH (ref 70–99)

## 2021-05-18 NOTE — Progress Notes (Signed)
COVID test-11/11  PCP - Dr. Elbert Ewings Cardiologist - Dr. Dominga Ferry  Chest x-ray - no EKG - 05/18/21-chart Stress Test -2019  ECHO - no Cardiac Cath - no Pacemaker/ICD device last checked:NA  Sleep Study - no CPAP -   Fasting Blood Sugar - 115-125 Checks Blood Sugar _QD____ times a day  Blood Thinner Instructions:NA Aspirin Instructions: Last Dose:  Anesthesia review: yes  Patient denies shortness of breath, fever, cough and chest pain at PAT appointment Pt has no SOB with activities, she had adrenal insufficiency and RA.  Patient verbalized understanding of instructions that were given to them at the PAT appointment. Patient was also instructed that they will need to review over the PAT instructions again at home before surgery. yes

## 2021-05-19 NOTE — Anesthesia Preprocedure Evaluation (Addendum)
Anesthesia Evaluation  Patient identified by MRN, date of birth, ID band Patient awake    Reviewed: Allergy & Precautions, NPO status , Patient's Chart, lab work & pertinent test results  Airway Mallampati: II  TM Distance: >3 FB Neck ROM: Full    Dental  (+) Dental Advisory Given, Teeth Intact   Pulmonary neg pulmonary ROS,    Pulmonary exam normal breath sounds clear to auscultation       Cardiovascular hypertension, Pt. on home beta blockers and Pt. on medications Normal cardiovascular exam Rhythm:Regular Rate:Normal     Neuro/Psych  Headaches, PSYCHIATRIC DISORDERS Anxiety Depression  Neuromuscular disease    GI/Hepatic Neg liver ROS, hiatal hernia, GERD  ,  Endo/Other  diabetes  Renal/GU negative Renal ROS     Musculoskeletal  (+) Arthritis ,   Abdominal   Peds  Hematology  (+) Blood dyscrasia, anemia ,   Anesthesia Other Findings   Reproductive/Obstetrics                                                            Anesthesia Evaluation  Patient identified by MRN, date of birth, ID band Patient awake    Reviewed: Allergy & Precautions, NPO status , Patient's Chart, lab work & pertinent test results  History of Anesthesia Complications Negative for: history of anesthetic complications  Airway Mallampati: II  TM Distance: >3 FB Neck ROM: Full    Dental  (+) Teeth Intact   Pulmonary neg pulmonary ROS,    Pulmonary exam normal        Cardiovascular hypertension, Pt. on medications and Pt. on home beta blockers Normal cardiovascular exam     Neuro/Psych Anxiety Depression negative neurological ROS     GI/Hepatic Neg liver ROS, hiatal hernia, GERD  ,Esophageal stricture   Endo/Other  diabetes, Oral Hypoglycemic Agents  Renal/GU negative Renal ROS  negative genitourinary   Musculoskeletal  (+) Arthritis , Osteoarthritis and Rheumatoid disorders,     Abdominal   Peds  Hematology negative hematology ROS (+)   Anesthesia Other Findings   Reproductive/Obstetrics                             Anesthesia Physical Anesthesia Plan  ASA: 3  Anesthesia Plan: MAC   Post-op Pain Management:    Induction: Intravenous  PONV Risk Score and Plan: Propofol infusion, TIVA and Treatment may vary due to age or medical condition  Airway Management Planned: Natural Airway, Nasal Cannula and Simple Face Mask  Additional Equipment: None  Intra-op Plan:   Post-operative Plan:   Informed Consent: I have reviewed the patients History and Physical, chart, labs and discussed the procedure including the risks, benefits and alternatives for the proposed anesthesia with the patient or authorized representative who has indicated his/her understanding and acceptance.       Plan Discussed with:   Anesthesia Plan Comments:         Anesthesia Quick Evaluation  Anesthesia Physical Anesthesia Plan  ASA: 2  Anesthesia Plan: Spinal   Post-op Pain Management:  Regional for Post-op pain   Induction: Intravenous  PONV Risk Score and Plan: 3 and Ondansetron, Treatment may vary due to age or medical condition, Midazolam, Propofol infusion and TIVA  Airway Management Planned: Natural Airway  Additional Equipment: None  Intra-op Plan:   Post-operative Plan:   Informed Consent: I have reviewed the patients History and Physical, chart, labs and discussed the procedure including the risks, benefits and alternatives for the proposed anesthesia with the patient or authorized representative who has indicated his/her understanding and acceptance.     Dental advisory given  Plan Discussed with: CRNA  Anesthesia Plan Comments: (See PAT note 05/18/2021, Konrad Felix Ward, PA-C)      Anesthesia Quick Evaluation

## 2021-05-19 NOTE — Progress Notes (Signed)
Anesthesia Chart Review   Case: [id number] Date/Time: 05/26/21 0950   Procedure: TOTAL KNEE ARTHROPLASTY (Left: Knee)   Anesthesia type: Spinal   Pre-op diagnosis: Left knee osteoarthritis   Location: [facility] ROOM 09 / [facility]   Surgeons: Durene Romans, MD       DISCUSSION:64 y.o. never smoker with h/o HTN, GERD, DM II, RA, iatrogenic adrenal insufficiency due to steroid treatment for RA, left knee OA scheduled for above procedure 05/26/2021 with Dr. Durene Romans.   Pt on daily prednisone.   Surgery previously cancelled due to poorly controlled DM II, A1C 8.3%. She has followed up with PCP, A1C improved, now 5.6.   Per cardiology preoperative evaluation 01/08/2021, "Chart reviewed as part of pre-operative protocol coverage. Patient was contacted 01/08/2021 in reference to pre-operative risk assessment for pending surgery as outlined below.  Raven [name] was last seen on 09/15/20 by Dr. Wyline Mood.  Since that day, Raven [name] has done well. She does not have a history of ischemic heart disease.  She is able to complete 4.0 METS without angina, primarily limited by her knee pain.    Therefore, based on ACC/AHA guidelines, the patient would be at acceptable risk for the planned procedure without further cardiovascular testing."  Anticipate pt can proceed with planned procedure barring acute status change.   VS: BP 134/78   Pulse 88   Temp 36.9 C (Oral)   Resp 18   Ht 5' 8.5" (1.74 m)   Wt 64.5 kg   SpO2 97%   BMI 21.31 kg/m   PROVIDERS: McGowen, Maryjean Morn, MD is PCP   Dina Rich, MD is Cardiologist  LABS: Labs reviewed: Acceptable for surgery. (all labs ordered are listed, but only abnormal results are displayed)  Labs Reviewed  GLUCOSE, CAPILLARY - Abnormal; Notable for the following components:      Result Value   Glucose-Capillary 213 (*)    All other components within normal limits  CBC - Abnormal; Notable for the following components:   RBC 3.75 (*)    Hemoglobin  10.3 (*)    HCT 32.5 (*)    All other components within normal limits  COMPREHENSIVE METABOLIC PANEL - Abnormal; Notable for the following components:   Glucose, Bld 219 (*)    Calcium 8.7 (*)    All other components within normal limits  SURGICAL PCR SCREEN  TYPE AND SCREEN     IMAGES:   EKG: 05/18/2021 Rate 85 bpm  NSR Low voltage QRS Since last tracing no significant change   CV: Stress Test 11/01/2017 There was no ST segment deviation noted during stress. The study is normal. There are no perfusion defects This is a low risk study. The left ventricular ejection fraction is hyperdynamic (>65%). Past Medical History:  Diagnosis Date   Acute medial meniscus tear of right knee    Adrenal insufficiency (Addison's disease) (HCC) 02/2021   Anemia of chronic disease    Mild, Hb stable consistently   Anxiety and depression    Chest pain 10/2017   +Cardiac CT.  Myocardial perfusion imaging NORMAL, EF>65%   Diabetes mellitus 1995   Managed by Dr. Elvera Lennox (Endo)   GERD (gastroesophageal reflux disease) per pt watches diet and take tums as needed   with hx of prox esoph stricture + dilation procedure   H/O hiatal hernia    slightly larger ("moderate" size) on CT angio done to r/o PE 10/2017. noted on egd again 03/2021   History of adrenal insufficiency  in 2022 when she tried coming off her chronic daily prednisone for her RA   Hyperlipidemia    Hypertension    Interstitial cystitis    Lichen sclerosus of female genitalia    Migraine    Osteoarthritis, multiple sites    Palpitations 10/2017   3 wk event monitor: symptoms correlate with sinus rhythm with PACs, at times runs of PACs up to 5 beats.   Pseudogout of knee, left    colchicine helpful   Psoriasis    Seasonal allergies    Seropositive rheumatoid arthritis (HCC)    Hands, knees, jaw, elbow: responding to Simponi as of 09/29/15 rheum f/u.  Waning effectiveness on 03/2016 f/u so pt switched to Orencia injections.   Doing well on chronic low-dose prednisone therapy+ methotrexate +orencia as of 01/2019, 08/2019, 11/2019    Past Surgical History:  Procedure Laterality Date   ABDOMINAL HYSTERECTOMY  1991   for fibroids; ovaries still in   BIOPSY  03/17/2021   Procedure: BIOPSY;  Surgeon: Kathi Der, MD;  Location: [facility];  Service: Gastroenterology;;   BREAST BIOPSY  2001; 02/12/16   fibrocystic breast disease in 2001 and again in 02/2016 (+scar from prev bx site w/calcifications).   CARDIOVASCULAR STRESS TEST  11/01/2017   Myocard perf imaging: NORMAL (EF >60-65%)   COLONOSCOPY  09/12/2013; 02/25/20   Normal 2015 and 2021: Recall 5 yrs ([facility], Dr. Evette Cristal) due to FH of colon polyps.   CYSTOSCOPY  2004   for chronic UTI   DEXA  10/2016   Bone density normal (T-score 0.0)   ESOPHAGOGASTRODUODENOSCOPY  04/19/2011   Nl except antral gastritis: bx = mild chron gastritis (H pyloria neg)    ESOPHAGOGASTRODUODENOSCOPY (EGD) WITH PROPOFOL N/A 03/27/2020   Esoph stenosis-->dilated.  Mod size hiatal hernia (Dig Hea Spec). Procedure: ESOPHAGOGASTRODUODENOSCOPY (EGD) WITH PROPOFOL ;  Surgeon: Graylin Shiver, MD;  [facility];  Service: Endoscopy;  Laterality: N/A;   ESOPHAGOGASTRODUODENOSCOPY (EGD) WITH PROPOFOL N/A 03/17/2021   Esoph stricture->dilated.  Large hiatal hernia. Also +esoph candidiasis, bx inflammation.  Procedure: ESOPHAGOGASTRODUODENOSCOPY (EGD) WITH PROPOFOL;  Surgeon: Kathi Der, MD;  [facility];  Service: Gastroenterology;  Laterality: N/A;   EVENT MONITOR  10/2017   3 wk-->Symptoms correlate with sinus rhythm with PACs, at times runs of PACs up to 5 beats.   GALLBLADDER SURGERY  2019   intraarticular steroid injection  2013   3 R knee & L X 1; Dr Netta Corrigan   KNEE ARTHROCENTESIS  2013   R knee x 3 & L X 1   KNEE ARTHROSCOPY  10/21/2011   Procedure: ARTHROSCOPY KNEE;  Surgeon: Jacki Cones, MD;  Location: [facility];  Service:  Orthopedics;  Laterality: Right;  WITH MEDIAL and lateral shaving of femoral chondyl  with microfracture technique of lateral and medial femoral chondyl suprapatellar synovectomy   LAPAROSCOPIC CHOLECYSTECTOMY  01/2018   SAVORY DILATION N/A 03/27/2020   Procedure: SAVORY DILATION;  Surgeon: Graylin Shiver, MD;  [facility];  Service: Endoscopy;  Laterality: N/A;   TOTAL KNEE ARTHROPLASTY  04/12/2012   RIGHT  Procedure: TOTAL KNEE ARTHROPLASTY;  Surgeon: Jacki Cones, MD;  [facility];  Service: Orthopedics;  Laterality: Right;  Right Total Knee Arthroplasty   TUBAL LIGATION  1981    MEDICATIONS:  Abatacept (ORENCIA) 125 MG/ML SOSY   acetaminophen (TYLENOL) 500 MG tablet   amphetamine-dextroamphetamine (ADDERALL XR) 15 MG 24 hr capsule   cetirizine (ZYRTEC) 10 MG tablet  Colchicine 0.6 MG CAPS   diazepam (VALIUM) 5 MG tablet   diclofenac Sodium (VOLTAREN) 1 % GEL   folic acid (FOLVITE) 1 MG tablet   ibuprofen (ADVIL) 200 MG tablet   leflunomide (ARAVA) 20 MG tablet   lisinopril (ZESTRIL) 20 MG tablet   metFORMIN (GLUCOPHAGE-XR) 500 MG 24 hr tablet   methotrexate 50 MG/2ML injection   metoprolol tartrate (LOPRESSOR) 25 MG tablet   omeprazole (PRILOSEC) 40 MG capsule   phenazopyridine (PYRIDIUM) 95 MG tablet   Polyvinyl Alcohol-Povidone (REFRESH OP)   predniSONE (DELTASONE) 5 MG tablet   primidone (MYSOLINE) 50 MG tablet   promethazine (PHENERGAN) 12.5 MG tablet   rosuvastatin (CRESTOR) 20 MG tablet   sertraline (ZOLOFT) 100 MG tablet   valACYclovir (VALTREX) 1000 MG tablet   No current facility-administered medications for this encounter.    Jodell Cipro Ward, PA-C [facility] [phone]

## 2021-05-21 NOTE — H&P (Signed)
TOTAL KNEE ADMISSION H&P  Patient is being admitted for left total knee arthroplasty.  Subjective:  Chief Complaint:left knee pain.  HPI: Raven [name], 65 y.o. female, has a history of pain and functional disability in the left knee due to arthritis and has failed non-surgical conservative treatments for greater than 12 weeks to includeNSAID's and/or analgesics, corticosteriod injections, and activity modification.  Onset of symptoms was gradual, starting 2 years ago with gradually worsening course since that time. The patient noted no past surgery on the left knee(s).  Patient currently rates pain in the left knee(s) at 8 out of 10 with activity. Patient has worsening of pain with activity and weight bearing, pain that interferes with activities of daily living, and pain with passive range of motion.  Patient has evidence of joint space narrowing by imaging studies. There is no active infection.  Patient Active Problem List   Diagnosis Date Noted   Iatrogenic adrenal insufficiency ([place]) 04/30/2021   Right thyroid nodule 04/30/2021   Overweight (BMI 25.0-29.9) 07/23/2019   Anxiety and depression    Gallstones 11/11/2017   UTI (urinary tract infection) 10/31/2015   Acute bronchitis 12/18/2013   Essential tremor 10/11/2012   Rheumatoid arthritis(714.0) 07/27/2012   Intention tremor 07/27/2012   Anxiety state 04/14/2009   Type 2 diabetes mellitus with hyperglycemia, without long-term current use of insulin ([place]) 12/03/2008   CARPAL TUNNEL SYNDROME, BILATERAL 03/06/2008   Essential hypertension 03/06/2008   GERD 03/06/2008   Hyperlipidemia 12/01/2007   MIGRAINE HEADACHE 12/01/2007   Past Medical History:  Diagnosis Date   Acute medial meniscus tear of right knee    Adrenal insufficiency (Addison's disease) ([place]) 02/2021   Anemia of chronic disease    Mild, Hb stable consistently   Anxiety and depression    Chest pain 10/2017   +Cardiac CT.  Myocardial perfusion imaging NORMAL,  EF>65%   Diabetes mellitus 1995   Managed by Dr. Cruzita Lederer (Endo)   GERD (gastroesophageal reflux disease) per pt watches diet and take tums as needed   with hx of prox esoph stricture + dilation procedure   H/O hiatal hernia    slightly larger ("moderate" size) on CT angio done to r/o PE 10/2017. noted on egd again 03/2021   History of adrenal insufficiency    in 2022 when she tried coming off her chronic daily prednisone for her RA   Hyperlipidemia    Hypertension    Interstitial cystitis    Lichen sclerosus of female genitalia    Migraine    Osteoarthritis, multiple sites    Palpitations 10/2017   3 wk event monitor: symptoms correlate with sinus rhythm with PACs, at times runs of PACs up to 5 beats.   Pseudogout of knee, left    colchicine helpful   Psoriasis    Seasonal allergies    Seropositive rheumatoid arthritis ([place])    Hands, knees, jaw, elbow: responding to Simponi as of 09/29/15 rheum f/u.  Waning effectiveness on 03/2016 f/u so pt switched to Orencia injections.  Doing well on chronic low-dose prednisone therapy+ methotrexate +orencia as of 01/2019, 08/2019, 11/2019    Past Surgical History:  Procedure Laterality Date   ABDOMINAL HYSTERECTOMY  1991   for fibroids; ovaries still in   BIOPSY  03/17/2021   Procedure: BIOPSY;  Surgeon: Otis Brace, MD;  Location: [facility];  Service: Gastroenterology;;   BREAST BIOPSY  2001; 02/12/16   fibrocystic breast disease in 2001 and again in 02/2016 (+scar from prev bx site w/calcifications).  CARDIOVASCULAR STRESS TEST  11/01/2017   Myocard perf imaging: NORMAL (EF >60-65%)   COLONOSCOPY  09/12/2013; 02/25/20   Normal 2015 and 2021: Recall 5 yrs ([facility], Dr. Penelope Coop) due to [place] of colon polyps.   CYSTOSCOPY  2004   for chronic UTI   DEXA  10/2016   Bone density normal (T-score 0.0)   ESOPHAGOGASTRODUODENOSCOPY  04/19/2011   Nl except antral gastritis: bx = mild chron gastritis (H pyloria neg)    ESOPHAGOGASTRODUODENOSCOPY  (EGD) WITH PROPOFOL N/A 03/27/2020   Esoph stenosis-->dilated.  Mod size hiatal hernia ([place]). Procedure: ESOPHAGOGASTRODUODENOSCOPY (EGD) WITH PROPOFOL ;  Surgeon: Wonda Horner, MD;  Location: [facility];  Service: Endoscopy;  Laterality: N/A;   ESOPHAGOGASTRODUODENOSCOPY (EGD) WITH PROPOFOL N/A 03/17/2021   Esoph stricture->dilated.  Large hiatal hernia. Also +esoph candidiasis, bx inflammation.  Procedure: ESOPHAGOGASTRODUODENOSCOPY (EGD) WITH PROPOFOL;  Surgeon: Otis Brace, MD;  Location: [facility];  Service: Gastroenterology;  Laterality: N/A;   EVENT MONITOR  10/2017   3 wk-->Symptoms correlate with sinus rhythm with PACs, at times runs of PACs up to 5 beats.   GALLBLADDER SURGERY  2019   intraarticular steroid injection  2013   3 R knee & L X 1; Dr Rushie Nyhan   KNEE ARTHROCENTESIS  2013   R knee x 3 & L X 1   KNEE ARTHROSCOPY  10/21/2011   Procedure: ARTHROSCOPY KNEE;  Surgeon: Tobi Bastos, MD;  [facility];  Service: Orthopedics;  Laterality: Right;  WITH MEDIAL and lateral shaving of femoral chondyl  with microfracture technique of lateral and medial femoral chondyl suprapatellar synovectomy   LAPAROSCOPIC CHOLECYSTECTOMY  01/2018   SAVORY DILATION N/A 03/27/2020   Procedure: SAVORY DILATION;  Surgeon: Wonda Horner, MD;  Location: [facility];  Service: Endoscopy;  Laterality: N/A;   TOTAL KNEE ARTHROPLASTY  04/12/2012   RIGHT  Procedure: TOTAL KNEE ARTHROPLASTY;  Surgeon: Tobi Bastos, MD;  Location: [facility];  Service: Orthopedics;  Laterality: Right;  Right Total Knee Arthroplasty   TUBAL LIGATION  1981    No current facility-administered medications for this encounter.   Current Outpatient Medications  Medication Sig Dispense Refill Last Dose   Abatacept (ORENCIA) 125 MG/ML SOSY Inject 125 mg into the skin every Monday.      acetaminophen (TYLENOL) 500 MG tablet Take 1,000 mg by mouth every 6 (six) hours as needed for  moderate pain.      cetirizine (ZYRTEC) 10 MG tablet Take 10 mg by mouth daily.      Colchicine 0.6 MG CAPS Take 1.2 mg by mouth at bedtime.      diazepam (VALIUM) 5 MG tablet TAKE ONE TABLET BY MOUTH EVERY TWELVE HOURS AS NEEDED FOR MIGRAINE HEADACHES 60 tablet 5    diclofenac Sodium (VOLTAREN) 1 % GEL Apply 1 application topically 2 (two) times daily as needed (pain).      folic acid (FOLVITE) 1 MG tablet Take 1 mg by mouth at bedtime.      ibuprofen (ADVIL) 200 MG tablet Take 400 mg by mouth every 6 (six) hours as needed for moderate pain.      leflunomide (ARAVA) 20 MG tablet Take 10 mg by mouth daily.      lisinopril (ZESTRIL) 20 MG tablet Take 0.5 tablets (10 mg total) by mouth daily. (Patient taking differently: Take 10 mg by mouth every other day.) 45 tablet 3    metFORMIN (GLUCOPHAGE-XR) 500 MG 24 hr tablet Take 2 tablets (1,000  mg total) by mouth 2 (two) times daily with a meal. 360 tablet 3    methotrexate 50 MG/2ML injection Inject 22.5 mg into the muscle every Monday.      metoprolol tartrate (LOPRESSOR) 25 MG tablet Take 0.5 tablets (12.5 mg total) by mouth 2 (two) times daily. 90 tablet 3    omeprazole (PRILOSEC) 40 MG capsule Take 1 capsule (40 mg total) by mouth 2 (two) times daily. 60 capsule 3    phenazopyridine (PYRIDIUM) 95 MG tablet Take 190 mg by mouth 2 (two) times daily as needed for pain.      Polyvinyl Alcohol-Povidone (REFRESH OP) Place 1 drop into both eyes 2 (two) times daily.      predniSONE (DELTASONE) 5 MG tablet Take 1-2 tablets (5-10 mg total) by mouth daily. (Patient taking differently: Take 5 mg by mouth daily with breakfast.) 180 tablet 3    primidone (MYSOLINE) 50 MG tablet Take 50-100 mg by mouth See admin instructions. Take 50 mg by mouth in the morning and 100 mg by mouth in the evening.      promethazine (PHENERGAN) 12.5 MG tablet 1-2 tabs po q6h prn nausea 30 tablet 2    rosuvastatin (CRESTOR) 20 MG tablet Take 1 tablet (20 mg total) by mouth daily.  (Patient taking differently: Take 20 mg by mouth at bedtime.) 90 tablet 3    sertraline (ZOLOFT) 100 MG tablet TAKE ONE TABLET BY MOUTH EVERY DAY 90 tablet 3    amphetamine-dextroamphetamine (ADDERALL XR) 15 MG 24 hr capsule TAKE ONE CAPSULE BY MOUTH EVERY MORNING 30 capsule 0    valACYclovir (VALTREX) 1000 MG tablet 1 tab po tid x 7d (Patient not taking: No sig reported) 21 tablet 0 Not Taking   Allergies  Allergen Reactions   Steri-Strip Compound Benzoin [Benzoin Compound] Anaphylaxis and Itching    Blisters and itching   Penicillins Rash   Pravastatin     Myalgias   Simvastatin     Myalgias   Hydrocodone-Acetaminophen Nausea And Vomiting    Social History   Tobacco Use   Smoking status: Never   Smokeless tobacco: Never  Substance Use Topics   Alcohol use: No    Family History  Problem Relation Age of Onset   Diabetes Mother    Stroke Mother 14   Osteoarthritis Mother    Stroke Brother 46   Heart disease Father        CABG   Lung cancer Father    Prostate cancer Father    Pancreatic cancer Father    Pancreatic cancer Paternal Grandfather    Bipolar disorder Daughter    Anxiety disorder Son    Arthritis Maternal Grandmother        rheumatoid   Heart attack Brother 24       smoker     Review of Systems  Constitutional:  Negative for chills and fever.  Respiratory:  Negative for cough and shortness of breath.   Cardiovascular:  Negative for chest pain.  Gastrointestinal:  Negative for nausea and vomiting.  Musculoskeletal:  Positive for arthralgias.    Objective:  Physical Exam Well nourished and well developed. General: Alert and oriented x3, cooperative and pleasant, no acute distress. Head: normocephalic, atraumatic, neck supple. Eyes: EOMI.  Musculoskeletal: Left knee exam: Moderate effusion without erythema or warmth Crepitation and discomfort with knee flexion anterior medial Tenderness medial and anterior Stable ligaments  Calves soft and  nontender. Motor function intact in LE. Strength 5/5 LE bilaterally. Neuro: Distal pulses  2+. Sensation to light touch intact in LE.  Vital signs in last 24 hours:    Labs:   Estimated body mass index is 21.31 kg/m as calculated from the following:   Height as of 05/18/21: 5' 8.5" (1.74 m).   Weight as of 05/18/21: 64.5 kg.   Imaging Review Plain radiographs demonstrate severe degenerative joint disease of the left knee(s). The overall alignment isneutral. The bone quality appears to be adequate for age and reported activity level.      Assessment/Plan:  End stage arthritis, left knee   The patient history, physical examination, clinical judgment of the provider and imaging studies are consistent with end stage degenerative joint disease of the left knee(s) and total knee arthroplasty is deemed medically necessary. The treatment options including medical management, injection therapy arthroscopy and arthroplasty were discussed at length. The risks and benefits of total knee arthroplasty were presented and reviewed. The risks due to aseptic loosening, infection, stiffness, patella tracking problems, thromboembolic complications and other imponderables were discussed. The patient acknowledged the explanation, agreed to proceed with the plan and consent was signed. Patient is being admitted for inpatient treatment for surgery, pain control, PT, OT, prophylactic antibiotics, VTE prophylaxis, progressive ambulation and ADL's and discharge planning. The patient is planning to be discharged  home.  Therapy Plans: outpatient [facility] PT Disposition: Home with husband Planned DVT Prophylaxis: aspirin 81mg  BID DME needed: none PCP: Dr. , clearance received Endocrinologist: Dr. Milinda Cave, clearance received Cardiologist: Dr. Lafe Garin, clearance received TXA: IV Allergies: steri-strips - rash, codeine - nausea/vomiting, PCN - rash, statins - leg cramps Anesthesia Concerns: BMI:  21.5 Last HgbA1c: 5.6%  Other: - Adrenal insufficiency - on Prednisone 5 mg daily - Nausea/vomiting with all narcotics, will use as a backup - tylenol, celebrex, toradol, robaxin (will try dilaudid) *took only tylenol with last surgery* - Hx of RA, Psoriatic arthritis - on methotrexate, Orencia, leflunomide, and Prednisone 5 mg daily   Patient's anticipated LOS is less than 2 midnights, meeting these requirements: - Younger than 89 - Lives within 1 hour of care - Has a competent adult at home to recover with post-op recover - NO history of  - Chronic pain requiring opiods  - Diabetes  - Coronary Artery Disease  - Heart failure  - Heart attack  - Stroke  - DVT/VTE  - Cardiac arrhythmia  - Respiratory Failure/COPD  - Renal failure  - Anemia  - Advanced Liver disease  75, PA-C Orthopedic Surgery [facility] [place] [phone]

## 2021-05-22 ENCOUNTER — Other Ambulatory Visit: Payer: Self-pay | Admitting: Orthopedic Surgery

## 2021-05-22 LAB — SARS CORONAVIRUS 2 (TAT 6-24 HRS): SARS Coronavirus 2: NEGATIVE

## 2021-05-26 ENCOUNTER — Observation Stay (HOSPITAL_COMMUNITY)
Admission: RE | Admit: 2021-05-26 | Discharge: 2021-05-27 | Disposition: A | Discharge status: Home / Self Care | Payer: 59 | Attending: Orthopedic Surgery | Admitting: Orthopedic Surgery

## 2021-05-26 ENCOUNTER — Encounter (HOSPITAL_COMMUNITY)
Admission: RE | Disposition: A | Discharge status: Home / Self Care | Payer: Self-pay | Source: Home / Self Care | Attending: Orthopedic Surgery

## 2021-05-26 ENCOUNTER — Ambulatory Visit (HOSPITAL_COMMUNITY): Payer: 59 | Admitting: Physician Assistant

## 2021-05-26 ENCOUNTER — Encounter (HOSPITAL_COMMUNITY): Payer: Self-pay | Admitting: Orthopedic Surgery

## 2021-05-26 ENCOUNTER — Ambulatory Visit (HOSPITAL_COMMUNITY): Payer: 59 | Admitting: Anesthesiology

## 2021-05-26 ENCOUNTER — Other Ambulatory Visit: Payer: Self-pay

## 2021-05-26 DIAGNOSIS — Z7984 Long term (current) use of oral hypoglycemic drugs: Secondary | ICD-10-CM | POA: Insufficient documentation

## 2021-05-26 DIAGNOSIS — I10 Essential (primary) hypertension: Secondary | ICD-10-CM | POA: Diagnosis not present

## 2021-05-26 DIAGNOSIS — Z96652 Presence of left artificial knee joint: Secondary | ICD-10-CM

## 2021-05-26 DIAGNOSIS — Z96651 Presence of right artificial knee joint: Secondary | ICD-10-CM | POA: Insufficient documentation

## 2021-05-26 DIAGNOSIS — M1712 Unilateral primary osteoarthritis, left knee: Secondary | ICD-10-CM | POA: Diagnosis present

## 2021-05-26 DIAGNOSIS — Z79899 Other long term (current) drug therapy: Secondary | ICD-10-CM | POA: Insufficient documentation

## 2021-05-26 DIAGNOSIS — E119 Type 2 diabetes mellitus without complications: Secondary | ICD-10-CM | POA: Diagnosis not present

## 2021-05-26 HISTORY — PX: TOTAL KNEE ARTHROPLASTY: SHX125

## 2021-05-26 LAB — TYPE AND SCREEN
ABO/RH(D): A POS
Antibody Screen: NEGATIVE

## 2021-05-26 LAB — GLUCOSE, CAPILLARY
Glucose-Capillary: 232 mg/dL — ABNORMAL HIGH (ref 70–99)
Glucose-Capillary: 169 mg/dL — ABNORMAL HIGH (ref 70–99)
Glucose-Capillary: 248 mg/dL — ABNORMAL HIGH (ref 70–99)

## 2021-05-26 SURGERY — ARTHROPLASTY, KNEE, TOTAL
Anesthesia: General | Site: Knee | Laterality: Left

## 2021-05-26 MED ORDER — LISINOPRIL 10 MG PO TABS
10.0000 mg | ORAL_TABLET | ORAL | Status: DC
  Administered 2021-05-27: 10 mg via ORAL
  Filled 2021-05-26: qty 1

## 2021-05-26 MED ORDER — DROPERIDOL 2.5 MG/ML IJ SOLN: 0.6250 mg | Freq: Once | INTRAMUSCULAR | Status: DC | PRN

## 2021-05-26 MED ORDER — BISACODYL 10 MG RE SUPP: 10.0000 mg | Freq: Every day | RECTAL | Status: DC | PRN

## 2021-05-26 MED ORDER — PRIMIDONE 50 MG PO TABS
50.0000 mg | ORAL_TABLET | Freq: Every day | ORAL | Status: DC
  Administered 2021-05-27: 50 mg via ORAL
  Filled 2021-05-26: qty 1

## 2021-05-26 MED ORDER — SODIUM CHLORIDE 0.9 % IV SOLN: INTRAVENOUS | Status: DC

## 2021-05-26 MED ORDER — DEXAMETHASONE SODIUM PHOSPHATE 10 MG/ML IJ SOLN
10.0000 mg | Freq: Once | INTRAMUSCULAR | Status: AC
  Administered 2021-05-27: 10 mg via INTRAVENOUS
  Filled 2021-05-26: qty 1

## 2021-05-26 MED ORDER — HYDROMORPHONE HCL 1 MG/ML IJ SOLN
INTRAMUSCULAR | Status: AC
  Filled 2021-05-26: qty 2

## 2021-05-26 MED ORDER — METHOCARBAMOL 500 MG IVPB - SIMPLE MED
500.0000 mg | Freq: Four times a day (QID) | INTRAVENOUS | Status: DC | PRN
  Filled 2021-05-26: qty 50

## 2021-05-26 MED ORDER — COLCHICINE 0.6 MG PO CAPS: 1.2000 mg | ORAL_CAPSULE | Freq: Every day | ORAL | Status: DC

## 2021-05-26 MED ORDER — HYDROMORPHONE HCL 2 MG PO TABS
1.0000 mg | ORAL_TABLET | ORAL | Status: DC | PRN
Start: 2021-05-26 — End: 2021-05-27
  Administered 2021-05-26: 2 mg via ORAL

## 2021-05-26 MED ORDER — HYDROMORPHONE HCL 1 MG/ML IJ SOLN: 0.5000 mg | INTRAMUSCULAR | Status: DC | PRN

## 2021-05-26 MED ORDER — EPHEDRINE 5 MG/ML INJ
INTRAVENOUS | Status: AC
  Filled 2021-05-26: qty 5

## 2021-05-26 MED ORDER — FENTANYL CITRATE PF 50 MCG/ML IJ SOSY
PREFILLED_SYRINGE | INTRAMUSCULAR | Status: AC
  Filled 2021-05-26: qty 2

## 2021-05-26 MED ORDER — POLYETHYLENE GLYCOL 3350 17 G PO PACK: 17.0000 g | PACK | Freq: Every day | ORAL | Status: DC | PRN

## 2021-05-26 MED ORDER — SODIUM CHLORIDE (PF) 0.9 % IJ SOLN
INTRAMUSCULAR | Status: DC | PRN
  Administered 2021-05-26: 30 mL

## 2021-05-26 MED ORDER — CEFAZOLIN SODIUM-DEXTROSE 2-4 GM/100ML-% IV SOLN
2.0000 g | INTRAVENOUS | Status: AC
  Administered 2021-05-26: 2 g via INTRAVENOUS
  Filled 2021-05-26: qty 100

## 2021-05-26 MED ORDER — PRIMIDONE 50 MG PO TABS: 50.0000 mg | ORAL_TABLET | ORAL | Status: DC

## 2021-05-26 MED ORDER — SODIUM CHLORIDE (PF) 0.9 % IJ SOLN
INTRAMUSCULAR | Status: AC
  Filled 2021-05-26: qty 30

## 2021-05-26 MED ORDER — MENTHOL 3 MG MT LOZG: 1.0000 | LOZENGE | OROMUCOSAL | Status: DC | PRN

## 2021-05-26 MED ORDER — FENTANYL CITRATE (PF) 100 MCG/2ML IJ SOLN
INTRAMUSCULAR | Status: DC | PRN
  Administered 2021-05-26 (×4): 25 ug via INTRAVENOUS

## 2021-05-26 MED ORDER — ACETAMINOPHEN 325 MG PO TABS: 325.0000 mg | ORAL_TABLET | Freq: Four times a day (QID) | ORAL | Status: DC | PRN

## 2021-05-26 MED ORDER — DIAZEPAM 5 MG PO TABS: 5.0000 mg | ORAL_TABLET | Freq: Two times a day (BID) | ORAL | Status: DC | PRN

## 2021-05-26 MED ORDER — TRANEXAMIC ACID-NACL 1000-0.7 MG/100ML-% IV SOLN
1000.0000 mg | INTRAVENOUS | Status: AC
  Administered 2021-05-26: 1000 mg via INTRAVENOUS
  Filled 2021-05-26: qty 100

## 2021-05-26 MED ORDER — INSULIN ASPART 100 UNIT/ML IJ SOLN
0.0000 [IU] | Freq: Three times a day (TID) | INTRAMUSCULAR | Status: DC
  Administered 2021-05-26 – 2021-05-27 (×2): 3 [IU] via SUBCUTANEOUS

## 2021-05-26 MED ORDER — KETOROLAC TROMETHAMINE 15 MG/ML IJ SOLN
7.5000 mg | Freq: Four times a day (QID) | INTRAMUSCULAR | Status: DC
  Administered 2021-05-26 – 2021-05-27 (×3): 7.5 mg via INTRAVENOUS
  Filled 2021-05-26 (×3): qty 1

## 2021-05-26 MED ORDER — FERROUS SULFATE 325 (65 FE) MG PO TABS
325.0000 mg | ORAL_TABLET | Freq: Three times a day (TID) | ORAL | Status: DC
  Administered 2021-05-26 – 2021-05-27 (×2): 325 mg via ORAL
  Filled 2021-05-26 (×2): qty 1

## 2021-05-26 MED ORDER — TRANEXAMIC ACID-NACL 1000-0.7 MG/100ML-% IV SOLN
1000.0000 mg | Freq: Once | INTRAVENOUS | Status: AC
  Administered 2021-05-26: 1000 mg via INTRAVENOUS
  Filled 2021-05-26: qty 100

## 2021-05-26 MED ORDER — PANTOPRAZOLE SODIUM 40 MG PO TBEC
40.0000 mg | DELAYED_RELEASE_TABLET | Freq: Every day | ORAL | Status: DC
  Administered 2021-05-26 – 2021-05-27 (×2): 40 mg via ORAL
  Filled 2021-05-26 (×2): qty 1

## 2021-05-26 MED ORDER — KETOROLAC TROMETHAMINE 30 MG/ML IJ SOLN
INTRAMUSCULAR | Status: DC | PRN
  Administered 2021-05-26: 30 mg via INTRAVENOUS

## 2021-05-26 MED ORDER — METHOCARBAMOL 500 MG PO TABS: 500.0000 mg | ORAL_TABLET | Freq: Four times a day (QID) | ORAL | Status: DC | PRN

## 2021-05-26 MED ORDER — DEXAMETHASONE SODIUM PHOSPHATE 4 MG/ML IJ SOLN
INTRAMUSCULAR | Status: DC | PRN
  Administered 2021-05-26: 4 mg via PERINEURAL

## 2021-05-26 MED ORDER — LACTATED RINGERS IV SOLN: INTRAVENOUS | Status: DC

## 2021-05-26 MED ORDER — AMPHETAMINE-DEXTROAMPHET ER 5 MG PO CP24
15.0000 mg | ORAL_CAPSULE | Freq: Every day | ORAL | Status: DC
  Filled 2021-05-26 (×2): qty 3

## 2021-05-26 MED ORDER — MIDAZOLAM HCL 2 MG/2ML IJ SOLN: 2.0000 mg | Freq: Once | INTRAMUSCULAR | Status: DC

## 2021-05-26 MED ORDER — DEXAMETHASONE SODIUM PHOSPHATE 10 MG/ML IJ SOLN
8.0000 mg | Freq: Once | INTRAMUSCULAR | Status: AC
  Administered 2021-05-26: 8 mg via INTRAVENOUS

## 2021-05-26 MED ORDER — PHENAZOPYRIDINE HCL 200 MG PO TABS
200.0000 mg | ORAL_TABLET | Freq: Two times a day (BID) | ORAL | Status: DC | PRN
  Filled 2021-05-26: qty 1

## 2021-05-26 MED ORDER — COLCHICINE 0.6 MG PO TABS
1.2000 mg | ORAL_TABLET | Freq: Every day | ORAL | Status: DC
  Administered 2021-05-26: 1.2 mg via ORAL
  Filled 2021-05-26: qty 2

## 2021-05-26 MED ORDER — BUPIVACAINE-EPINEPHRINE (PF) 0.25% -1:200000 IJ SOLN
INTRAMUSCULAR | Status: DC | PRN
  Administered 2021-05-26: 30 mL

## 2021-05-26 MED ORDER — MIDAZOLAM HCL 2 MG/2ML IJ SOLN
INTRAMUSCULAR | Status: AC
  Filled 2021-05-26: qty 2

## 2021-05-26 MED ORDER — PREDNISONE 5 MG PO TABS: 5.0000 mg | ORAL_TABLET | Freq: Every day | ORAL | Status: DC

## 2021-05-26 MED ORDER — ROPIVACAINE HCL 5 MG/ML IJ SOLN
INTRAMUSCULAR | Status: DC | PRN
  Administered 2021-05-26: 30 mL via PERINEURAL

## 2021-05-26 MED ORDER — EPHEDRINE SULFATE-NACL 50-0.9 MG/10ML-% IV SOSY
PREFILLED_SYRINGE | INTRAVENOUS | Status: DC | PRN
  Administered 2021-05-26 (×2): 5 mg via INTRAVENOUS

## 2021-05-26 MED ORDER — ROSUVASTATIN CALCIUM 20 MG PO TABS
20.0000 mg | ORAL_TABLET | Freq: Every day | ORAL | Status: DC
  Administered 2021-05-26: 20 mg via ORAL
  Filled 2021-05-26: qty 1

## 2021-05-26 MED ORDER — BUPIVACAINE-EPINEPHRINE (PF) 0.25% -1:200000 IJ SOLN
INTRAMUSCULAR | Status: AC
  Filled 2021-05-26: qty 30

## 2021-05-26 MED ORDER — CEFAZOLIN SODIUM-DEXTROSE 2-4 GM/100ML-% IV SOLN
2.0000 g | Freq: Four times a day (QID) | INTRAVENOUS | Status: AC
  Administered 2021-05-26 (×2): 2 g via INTRAVENOUS
  Filled 2021-05-26 (×2): qty 100

## 2021-05-26 MED ORDER — CELECOXIB 200 MG PO CAPS
200.0000 mg | ORAL_CAPSULE | Freq: Two times a day (BID) | ORAL | Status: DC
  Administered 2021-05-26 – 2021-05-27 (×2): 200 mg via ORAL
  Filled 2021-05-26 (×2): qty 1

## 2021-05-26 MED ORDER — LORATADINE 10 MG PO TABS
10.0000 mg | ORAL_TABLET | Freq: Every day | ORAL | Status: DC
  Administered 2021-05-26 – 2021-05-27 (×2): 10 mg via ORAL
  Filled 2021-05-26 (×2): qty 1

## 2021-05-26 MED ORDER — PHENAZOPYRIDINE HCL 200 MG PO TABS: 190.0000 mg | ORAL_TABLET | Freq: Two times a day (BID) | ORAL | Status: DC | PRN

## 2021-05-26 MED ORDER — ORAL CARE MOUTH RINSE
15.0000 mL | Freq: Once | OROMUCOSAL | Status: AC
  Administered 2021-05-26: 15 mL via OROMUCOSAL

## 2021-05-26 MED ORDER — ACETAMINOPHEN 500 MG PO TABS
1000.0000 mg | ORAL_TABLET | Freq: Four times a day (QID) | ORAL | Status: DC
  Administered 2021-05-26 – 2021-05-27 (×3): 1000 mg via ORAL
  Filled 2021-05-26 (×3): qty 2

## 2021-05-26 MED ORDER — SODIUM CHLORIDE 0.9 % IR SOLN
Status: DC | PRN
  Administered 2021-05-26: 1000 mL

## 2021-05-26 MED ORDER — FOLIC ACID 1 MG PO TABS
1.0000 mg | ORAL_TABLET | Freq: Every day | ORAL | Status: DC
  Administered 2021-05-26: 1 mg via ORAL
  Filled 2021-05-26: qty 1

## 2021-05-26 MED ORDER — INSULIN ASPART 100 UNIT/ML IJ SOLN
5.0000 [IU] | Freq: Once | INTRAMUSCULAR | Status: AC
  Administered 2021-05-26: 5 [IU] via SUBCUTANEOUS

## 2021-05-26 MED ORDER — ROPIVACAINE HCL 5 MG/ML IJ SOLN: INTRAMUSCULAR | Status: DC | PRN

## 2021-05-26 MED ORDER — PHENOL 1.4 % MT LIQD: 1.0000 | OROMUCOSAL | Status: DC | PRN

## 2021-05-26 MED ORDER — AMPHETAMINE-DEXTROAMPHET ER 15 MG PO CP24: 15.0000 mg | ORAL_CAPSULE | Freq: Every day | ORAL | Status: DC

## 2021-05-26 MED ORDER — METOPROLOL TARTRATE 12.5 MG HALF TABLET
12.5000 mg | ORAL_TABLET | Freq: Two times a day (BID) | ORAL | Status: DC
  Administered 2021-05-26 – 2021-05-27 (×2): 12.5 mg via ORAL
  Filled 2021-05-26 (×2): qty 1

## 2021-05-26 MED ORDER — ONDANSETRON HCL 4 MG PO TABS: 4.0000 mg | ORAL_TABLET | Freq: Four times a day (QID) | ORAL | Status: DC | PRN

## 2021-05-26 MED ORDER — PROPOFOL 500 MG/50ML IV EMUL
INTRAVENOUS | Status: DC | PRN
  Administered 2021-05-26: 50 ug/kg/min via INTRAVENOUS

## 2021-05-26 MED ORDER — INSULIN ASPART 100 UNIT/ML IJ SOLN
INTRAMUSCULAR | Status: AC
  Filled 2021-05-26: qty 1

## 2021-05-26 MED ORDER — CLONIDINE HCL (ANALGESIA) 100 MCG/ML EP SOLN
EPIDURAL | Status: DC | PRN
  Administered 2021-05-26: 80 ug

## 2021-05-26 MED ORDER — ONDANSETRON HCL 4 MG/2ML IJ SOLN: 4.0000 mg | Freq: Four times a day (QID) | INTRAMUSCULAR | Status: DC | PRN

## 2021-05-26 MED ORDER — HYDROMORPHONE HCL 1 MG/ML IJ SOLN
0.2500 mg | INTRAMUSCULAR | Status: DC | PRN
Start: 2021-05-26 — End: 2021-05-26
  Administered 2021-05-26 (×4): 0.5 mg via INTRAVENOUS

## 2021-05-26 MED ORDER — PROPOFOL 10 MG/ML IV BOLUS
INTRAVENOUS | Status: DC | PRN
  Administered 2021-05-26: 20 mg via INTRAVENOUS
  Administered 2021-05-26 (×2): 30 mg via INTRAVENOUS
  Administered 2021-05-26: 100 mg via INTRAVENOUS
  Administered 2021-05-26: 50 mg via INTRAVENOUS

## 2021-05-26 MED ORDER — CHLORHEXIDINE GLUCONATE 0.12 % MT SOLN: 15.0000 mL | Freq: Once | OROMUCOSAL | Status: AC

## 2021-05-26 MED ORDER — METFORMIN HCL ER 500 MG PO TB24
1000.0000 mg | ORAL_TABLET | Freq: Two times a day (BID) | ORAL | Status: DC
  Administered 2021-05-27: 1000 mg via ORAL
  Filled 2021-05-26: qty 2

## 2021-05-26 MED ORDER — HYDROMORPHONE HCL 2 MG PO TABS
ORAL_TABLET | ORAL | Status: AC
  Filled 2021-05-26: qty 1

## 2021-05-26 MED ORDER — DOCUSATE SODIUM 100 MG PO CAPS
100.0000 mg | ORAL_CAPSULE | Freq: Two times a day (BID) | ORAL | Status: DC
  Administered 2021-05-26 – 2021-05-27 (×2): 100 mg via ORAL
  Filled 2021-05-26 (×2): qty 1

## 2021-05-26 MED ORDER — BUPIVACAINE IN DEXTROSE 0.75-8.25 % IT SOLN
INTRATHECAL | Status: DC | PRN
  Administered 2021-05-26: 1.6 mL via INTRATHECAL

## 2021-05-26 MED ORDER — METOCLOPRAMIDE HCL 5 MG PO TABS: 5.0000 mg | ORAL_TABLET | Freq: Three times a day (TID) | ORAL | Status: DC | PRN

## 2021-05-26 MED ORDER — ONDANSETRON HCL 4 MG/2ML IJ SOLN
INTRAMUSCULAR | Status: DC | PRN
  Administered 2021-05-26: 4 mg via INTRAVENOUS

## 2021-05-26 MED ORDER — DIPHENHYDRAMINE HCL 12.5 MG/5ML PO ELIX: 12.5000 mg | ORAL_SOLUTION | ORAL | Status: DC | PRN

## 2021-05-26 MED ORDER — METOCLOPRAMIDE HCL 5 MG/ML IJ SOLN: 5.0000 mg | Freq: Three times a day (TID) | INTRAMUSCULAR | Status: DC | PRN

## 2021-05-26 MED ORDER — KETOROLAC TROMETHAMINE 30 MG/ML IJ SOLN
INTRAMUSCULAR | Status: AC
  Filled 2021-05-26: qty 1

## 2021-05-26 MED ORDER — ASPIRIN 81 MG PO CHEW
81.0000 mg | CHEWABLE_TABLET | Freq: Two times a day (BID) | ORAL | Status: DC
  Administered 2021-05-26 – 2021-05-27 (×2): 81 mg via ORAL
  Filled 2021-05-26 (×2): qty 1

## 2021-05-26 MED ORDER — FENTANYL CITRATE PF 50 MCG/ML IJ SOSY: 100.0000 ug | PREFILLED_SYRINGE | Freq: Once | INTRAMUSCULAR | Status: DC

## 2021-05-26 MED ORDER — SERTRALINE HCL 100 MG PO TABS
100.0000 mg | ORAL_TABLET | Freq: Every day | ORAL | Status: DC
  Administered 2021-05-27: 100 mg via ORAL
  Filled 2021-05-26: qty 1

## 2021-05-26 MED ORDER — FENTANYL CITRATE (PF) 100 MCG/2ML IJ SOLN
INTRAMUSCULAR | Status: AC
  Filled 2021-05-26: qty 2

## 2021-05-26 MED ORDER — POVIDONE-IODINE 10 % EX SWAB
2.0000 "application " | Freq: Once | CUTANEOUS | Status: AC
  Administered 2021-05-26: 2 via TOPICAL

## 2021-05-26 MED ORDER — METHOCARBAMOL 500 MG IVPB - SIMPLE MED
INTRAVENOUS | Status: AC
  Administered 2021-05-26: 500 mg
  Filled 2021-05-26: qty 50

## 2021-05-26 MED ORDER — VANCOMYCIN HCL 1000 MG IV SOLR
INTRAVENOUS | Status: DC | PRN
  Administered 2021-05-26: 1000 mg via INTRAVENOUS

## 2021-05-26 MED ORDER — BUPIVACAINE-EPINEPHRINE 0.25% -1:200000 IJ SOLN
INTRAMUSCULAR | Status: AC
  Filled 2021-05-26: qty 1

## 2021-05-26 MED ORDER — VANCOMYCIN HCL IN DEXTROSE 1-5 GM/200ML-% IV SOLN
INTRAVENOUS | Status: AC
  Filled 2021-05-26: qty 200

## 2021-05-26 MED ORDER — PRIMIDONE 50 MG PO TABS
100.0000 mg | ORAL_TABLET | Freq: Every day | ORAL | Status: DC
  Administered 2021-05-26: 100 mg via ORAL
  Filled 2021-05-26: qty 2

## 2021-05-26 SURGICAL SUPPLY — 53 items
ATTUNE MED ANAT PAT 35 KNEE (Knees) ×2 IMPLANT
ATTUNE MED ANAT PAT 35MM KNEE (Knees) ×1 IMPLANT
ATTUNE PSFEM LTSZ5 NARCEM KNEE (Femur) ×3 IMPLANT
ATTUNE PSRP INSR SZ5 6 KNEE (Insert) ×2 IMPLANT
ATTUNE PSRP INSR SZ5 6MM KNEE (Insert) ×1 IMPLANT
BAG COUNTER SPONGE SURGICOUNT (BAG) ×4 IMPLANT
BAG SURGICOUNT SPONGE COUNTING (BAG) ×2
BAG ZIPLOCK 12X15 (MISCELLANEOUS) IMPLANT
BASE TIBIAL ROT PLAT SZ 5 KNEE (Knees) ×1 IMPLANT
BLADE SAW SGTL 11.0X1.19X90.0M (BLADE) ×3 IMPLANT
BLADE SAW SGTL 13.0X1.19X90.0M (BLADE) ×3 IMPLANT
BLADE SURG SZ10 CARB STEEL (BLADE) ×6 IMPLANT
BNDG ELASTIC 6X5.8 VLCR STR LF (GAUZE/BANDAGES/DRESSINGS) ×3 IMPLANT
BOWL SMART MIX CTS (DISPOSABLE) ×3 IMPLANT
CEMENT HV SMART SET (Cement) ×6 IMPLANT
CUFF TOURN SGL QUICK 34 (TOURNIQUET CUFF) ×3
CUFF TRNQT CYL 34X4.125X (TOURNIQUET CUFF) ×1 IMPLANT
DECANTER SPIKE VIAL GLASS SM (MISCELLANEOUS) IMPLANT
DERMABOND ADVANCED (GAUZE/BANDAGES/DRESSINGS) ×2
DERMABOND ADVANCED .7 DNX12 (GAUZE/BANDAGES/DRESSINGS) ×1 IMPLANT
DRAPE INCISE IOBAN 66X45 STRL (DRAPES) ×3 IMPLANT
DRAPE U-SHAPE 47X51 STRL (DRAPES) ×3 IMPLANT
DRESSING AQUACEL AG SP 3.5X10 (GAUZE/BANDAGES/DRESSINGS) ×1 IMPLANT
DRSG AQUACEL AG SP 3.5X10 (GAUZE/BANDAGES/DRESSINGS) ×3
DURAPREP 26ML APPLICATOR (WOUND CARE) ×6 IMPLANT
ELECT REM PT RETURN 15FT ADLT (MISCELLANEOUS) ×3 IMPLANT
GLOVE SURG ENC MOIS LTX SZ6 (GLOVE) ×3 IMPLANT
GLOVE SURG ENC MOIS LTX SZ7 (GLOVE) ×3 IMPLANT
GLOVE SURG UNDER POLY LF SZ7.5 (GLOVE) ×3 IMPLANT
GOWN STRL REUS W/TWL LRG LVL3 (GOWN DISPOSABLE) ×3 IMPLANT
HANDPIECE INTERPULSE COAX TIP (DISPOSABLE) ×3
HOLDER FOLEY CATH W/STRAP (MISCELLANEOUS) ×3 IMPLANT
KIT TURNOVER KIT A (KITS) IMPLANT
MANIFOLD NEPTUNE II (INSTRUMENTS) ×3 IMPLANT
NDL SAFETY ECLIPSE 18X1.5 (NEEDLE) IMPLANT
NEEDLE HYPO 18GX1.5 SHARP (NEEDLE)
NS IRRIG 1000ML POUR BTL (IV SOLUTION) ×3 IMPLANT
PACK TOTAL KNEE CUSTOM (KITS) ×3 IMPLANT
PROTECTOR NERVE ULNAR (MISCELLANEOUS) ×3 IMPLANT
SET HNDPC FAN SPRY TIP SCT (DISPOSABLE) ×1 IMPLANT
SET PAD KNEE POSITIONER (MISCELLANEOUS) ×3 IMPLANT
SUT MNCRL AB 4-0 PS2 18 (SUTURE) ×3 IMPLANT
SUT STRATAFIX PDS+ 0 24IN (SUTURE) ×3 IMPLANT
SUT VIC AB 1 CT1 36 (SUTURE) ×3 IMPLANT
SUT VIC AB 2-0 CT1 27 (SUTURE) ×9
SUT VIC AB 2-0 CT1 TAPERPNT 27 (SUTURE) ×3 IMPLANT
SYR 3ML LL SCALE MARK (SYRINGE) ×3 IMPLANT
TIBIAL BASE ROT PLAT SZ 5 KNEE (Knees) ×3 IMPLANT
TRAY FOLEY MTR SLVR 14FR STAT (SET/KITS/TRAYS/PACK) ×3 IMPLANT
TRAY FOLEY MTR SLVR 16FR STAT (SET/KITS/TRAYS/PACK) IMPLANT
TUBE SUCTION HIGH CAP CLEAR NV (SUCTIONS) ×6 IMPLANT
WATER STERILE IRR 1000ML POUR (IV SOLUTION) ×6 IMPLANT
WRAP KNEE MAXI GEL POST OP (GAUZE/BANDAGES/DRESSINGS) ×3 IMPLANT

## 2021-05-26 NOTE — Anesthesia Procedure Notes (Signed)
Procedure Name: LMA Insertion Date/Time: 05/26/2021 10:34 AM Performed by: Ezekiel Ina, CRNA Pre-anesthesia Checklist: Patient identified, Emergency Drugs available, Suction available and Patient being monitored Patient Re-evaluated:Patient Re-evaluated prior to induction Oxygen Delivery Method: Circle system utilized Preoxygenation: Pre-oxygenation with 100% oxygen Induction Type: IV induction Ventilation: Mask ventilation without difficulty LMA: LMA inserted LMA Size: 4.0 Number of attempts: 1 Tube secured with: Tape Dental Injury: Teeth and Oropharynx as per pre-operative assessment

## 2021-05-26 NOTE — Interval H&P Note (Signed)
History and Physical Interval Note:  05/26/2021 8:46 AM  Raven [name]  has presented today for surgery, with the diagnosis of Left knee osteoarthritis.  The various methods of treatment have been discussed with the patient and family. After consideration of risks, benefits and other options for treatment, the patient has consented to  Procedure(s): TOTAL KNEE ARTHROPLASTY (Left) as a surgical intervention.  The patient's history has been reviewed, patient examined, no change in status, stable for surgery.  I have reviewed the patient's chart and labs.  Questions were answered to the patient's satisfaction.     Shelda Pal

## 2021-05-26 NOTE — Anesthesia Postprocedure Evaluation (Signed)
Anesthesia Post Note  Patient: Raven [name]  Procedure(s) Performed: TOTAL KNEE ARTHROPLASTY (Left: Knee)     Patient location during evaluation: PACU Anesthesia Type: General Level of consciousness: sedated and patient cooperative Pain management: pain level controlled Vital Signs Assessment: post-procedure vital signs reviewed and stable Respiratory status: spontaneous breathing Cardiovascular status: stable Anesthetic complications: no   No notable events documented.  Last Vitals:  Vitals:   05/26/21 1520 05/26/21 1737  BP: 107/74 119/67  Pulse: 75 79  Resp: 17 16  Temp: 37.1 C 36.8 C  SpO2: 98% 98%    Last Pain:  Vitals:   05/26/21 1737  TempSrc: Oral  PainSc: 6                  Lewie Loron

## 2021-05-26 NOTE — Evaluation (Signed)
Physical Therapy Evaluation Patient Details Name: Raven [name] MRN: [id number] DOB: 08-Feb-1956 Today's Date: 05/26/2021  History of Present Illness  Patient is 65 y.o. female s/p Lt TKA on 05/26/21 with PMH significant for Addison's disease, anxiety, depression, DM, GERD, HLD, HTN, psoriasis, RA, Rt TKA in 2013.   Clinical Impression  Raven [name] is a 65 y.o. female POD 0 s/p Lt TKA. Patient reports independence with mobility at baseline. Patient is now limited by functional impairments (see PT problem list below) and requires min assist for transfers and gait with RW. Patient was able to ambulate ~50 feet with RW and min assist. Patient instructed in exercise to facilitate ROM and circulation to manage edema and reduce risk of DVT. Patient will benefit from continued skilled PT interventions to address impairments and progress towards PLOF. Acute PT will follow to progress mobility and stair training in preparation for safe discharge home.        Recommendations for follow up therapy are one component of a multi-disciplinary discharge planning process, led by the attending physician.  Recommendations may be updated based on patient status, additional functional criteria and insurance authorization.  Follow Up Recommendations Follow physician's recommendations for discharge plan and follow up therapies    Assistance Recommended at Discharge Frequent or constant Supervision/Assistance  Functional Status Assessment Patient has had a recent decline in their functional status and demonstrates the ability to make significant improvements in function in a reasonable and predictable amount of time.  Equipment Recommendations  Rolling walker (2 wheels)    Recommendations for Other Services       Precautions / Restrictions Precautions Precautions: Fall Restrictions Weight Bearing Restrictions: No LLE Weight Bearing: Weight bearing as tolerated      Mobility  Bed Mobility Overal  bed mobility: Needs Assistance Bed Mobility: Supine to Sit     Supine to sit: Min assist;HOB elevated     General bed mobility comments: cues to reach for bed rail and assist to bring Lt LE off EOB.    Transfers Overall transfer level: Needs assistance Equipment used: Rolling walker (2 wheels) Transfers: Sit to/from Stand Sit to Stand: Min assist                Ambulation/Gait Ambulation/Gait assistance: Min Chemical engineer (Feet): 50 Feet Assistive device: Rolling walker (2 wheels) Gait Pattern/deviations: Step-to pattern;Decreased stride length Gait velocity: decr        Stairs            Wheelchair Mobility    Modified Rankin (Stroke Patients Only)       Balance Overall balance assessment: Needs assistance Sitting-balance support: Feet supported Sitting balance-Leahy Scale: Good     Standing balance support: Reliant on assistive device for balance;During functional activity;Bilateral upper extremity supported Standing balance-Leahy Scale: Poor                               Pertinent Vitals/Pain Pain Assessment: 0-10 Pain Score: 7  Pain Location: Lt knee Pain Descriptors / Indicators: Aching;Discomfort Pain Intervention(s): Limited activity within patient's tolerance;Monitored during session;Repositioned;Ice applied    Home Living Family/patient expects to be discharged to:: Private residence Living Arrangements: Spouse/significant other;Children Available Help at Discharge: Family Type of Home: House Home Access: Stairs to enter Entrance Stairs-Rails: None Entrance Stairs-Number of Steps: 2   Home Layout: One level Home Equipment: Agricultural consultant (2 wheels);Rollator (4 wheels);Cane - single point;BSC/3in1;Shower seat;Grab bars - tub/shower;Hand held  shower head      Prior Function Prior Level of Function : Independent/Modified Independent                     Hand Dominance   Dominant Hand: Right     Extremity/Trunk Assessment   Upper Extremity Assessment Upper Extremity Assessment: Overall WFL for tasks assessed    Lower Extremity Assessment Lower Extremity Assessment: Overall WFL for tasks assessed    Cervical / Trunk Assessment Cervical / Trunk Assessment: Normal  Communication   Communication: No difficulties  Cognition Arousal/Alertness: Awake/alert Behavior During Therapy: WFL for tasks assessed/performed Overall Cognitive Status: Within Functional Limits for tasks assessed                                          General Comments      Exercises     Assessment/Plan    PT Assessment Patient needs continued PT services  PT Problem List Decreased activity tolerance;Decreased range of motion;Decreased strength;Decreased balance;Decreased mobility;Decreased knowledge of use of DME;Decreased knowledge of precautions;Pain       PT Treatment Interventions DME instruction;Gait training;Stair training;Functional mobility training;Therapeutic activities;Therapeutic exercise;Balance training;Patient/family education    PT Goals (Current goals can be found in the Care Plan section)  Acute Rehab PT Goals Patient Stated Goal: regain independence PT Goal Formulation: With patient Time For Goal Achievement: 06/02/21 Potential to Achieve Goals: Good    Frequency 7X/week   Barriers to discharge        Co-evaluation               AM-PAC PT "6 Clicks" Mobility  Outcome Measure Help needed turning from your back to your side while in a flat bed without using bedrails?: A Little Help needed moving from lying on your back to sitting on the side of a flat bed without using bedrails?: A Little Help needed moving to and from a bed to a chair (including a wheelchair)?: A Little Help needed standing up from a chair using your arms (e.g., wheelchair or bedside chair)?: A Little Help needed to walk in hospital room?: A Little Help needed climbing 3-5 steps  with a railing? : A Little 6 Click Score: 18    End of Session Equipment Utilized During Treatment: Gait belt Activity Tolerance: Patient tolerated treatment well Patient left: in chair;with chair alarm set;with call bell/phone within reach;with family/visitor present Nurse Communication: Mobility status PT Visit Diagnosis: Muscle weakness (generalized) (M62.81);Difficulty in walking, not elsewhere classified (R26.2)    Time: 8466-5993 PT Time Calculation (min) (ACUTE ONLY): 24 min   Charges:   PT Evaluation $PT Eval Low Complexity: 1 Low PT Treatments $Gait Training: 8-22 mins        Wynn Maudlin, DPT Acute Rehabilitation Services Office [phone] Pager [phone]   Anitra Lauth 05/26/2021, 5:24 PM

## 2021-05-26 NOTE — Progress Notes (Signed)
AssistedDr. Germeroth with left, ultrasound guided, adductor canal block. Side rails up, monitors on throughout procedure. See vital signs in flow sheet. Tolerated Procedure well.  

## 2021-05-26 NOTE — Transfer of Care (Signed)
Immediate Anesthesia Transfer of Care Note  Patient: Raven [name]  Procedure(s) Performed: TOTAL KNEE ARTHROPLASTY (Left: Knee)  Patient Location: PACU  Anesthesia Type:GA combined with regional for post-op pain  Level of Consciousness: drowsy  Airway & Oxygen Therapy: Patient Spontanous Breathing and Patient connected to nasal cannula oxygen  Post-op Assessment: Report given to RN and Post -op Vital signs reviewed and stable  Post vital signs: Reviewed and stable  Last Vitals:  Vitals Value Taken Time  BP 135/74 05/26/21 1200  Temp    Pulse 86 05/26/21 1201  Resp 14 05/26/21 1201  SpO2 100 % 05/26/21 1201  Vitals shown include unvalidated device data.  Last Pain:  Vitals:   05/26/21 0749  TempSrc: Oral         Complications: No notable events documented.

## 2021-05-26 NOTE — Discharge Instructions (Addendum)
INSTRUCTIONS AFTER JOINT REPLACEMENT   Please hold Orencia and Leflunomide for 2 weeks after surgery  Remove items at home which could result in a fall. This includes throw rugs or furniture in walking pathways ICE to the affected joint every three hours while awake for 30 minutes at a time, for at least the first 3-5 days, and then as needed for pain and swelling.  Continue to use ice for pain and swelling. You may notice swelling that will progress down to the foot and ankle.  This is normal after surgery.  Elevate your leg when you are not up walking on it.   Continue to use the breathing machine you got in the hospital (incentive spirometer) which will help keep your temperature down.  It is common for your temperature to cycle up and down following surgery, especially at night when you are not up moving around and exerting yourself.  The breathing machine keeps your lungs expanded and your temperature down.   DIET:  As you were doing prior to hospitalization, we recommend a well-balanced diet.  DRESSING / WOUND CARE / SHOWERING  Keep the surgical dressing until follow up.  The dressing is water proof, so you can shower without any extra covering.  IF THE DRESSING FALLS OFF or the wound gets wet inside, change the dressing with sterile gauze.  Please use good hand washing techniques before changing the dressing.  Do not use any lotions or creams on the incision until instructed by your surgeon.    ACTIVITY  Increase activity slowly as tolerated, but follow the weight bearing instructions below.   No driving for 6 weeks or until further direction given by your physician.  You cannot drive while taking narcotics.  No lifting or carrying greater than 10 lbs. until further directed by your surgeon. Avoid periods of inactivity such as sitting longer than an hour when not asleep. This helps prevent blood clots.  You may return to work once you are authorized by your doctor.     WEIGHT BEARING    Weight bearing as tolerated with assist device (walker, cane, etc) as directed, use it as long as suggested by your surgeon or therapist, typically at least 4-6 weeks.   EXERCISES  Results after joint replacement surgery are often greatly improved when you follow the exercise, range of motion and muscle strengthening exercises prescribed by your doctor. Safety measures are also important to protect the joint from further injury. Any time any of these exercises cause you to have increased pain or swelling, decrease what you are doing until you are comfortable again and then slowly increase them. If you have problems or questions, call your caregiver or physical therapist for advice.   Rehabilitation is important following a joint replacement. After just a few days of immobilization, the muscles of the leg can become weakened and shrink (atrophy).  These exercises are designed to build up the tone and strength of the thigh and leg muscles and to improve motion. Often times heat used for twenty to thirty minutes before working out will loosen up your tissues and help with improving the range of motion but do not use heat for the first two weeks following surgery (sometimes heat can increase post-operative swelling).   These exercises can be done on a training (exercise) mat, on the floor, on a table or on a bed. Use whatever works the best and is most comfortable for you.    Use music or television while you are  exercising so that the exercises are a pleasant break in your day. This will make your life better with the exercises acting as a break in your routine that you can look forward to.   Perform all exercises about fifteen times, three times per day or as directed.  You should exercise both the operative leg and the other leg as well.  Exercises include:   Quad Sets - Tighten up the muscle on the front of the thigh (Quad) and hold for 5-10 seconds.   Straight Leg Raises - With your knee straight  (if you were given a brace, keep it on), lift the leg to 60 degrees, hold for 3 seconds, and slowly lower the leg.  Perform this exercise against resistance later as your leg gets stronger.  Leg Slides: Lying on your back, slowly slide your foot toward your buttocks, bending your knee up off the floor (only go as far as is comfortable). Then slowly slide your foot back down until your leg is flat on the floor again.  Angel Wings: Lying on your back spread your legs to the side as far apart as you can without causing discomfort.  Hamstring Strength:  Lying on your back, push your heel against the floor with your leg straight by tightening up the muscles of your buttocks.  Repeat, but this time bend your knee to a comfortable angle, and push your heel against the floor.  You may put a pillow under the heel to make it more comfortable if necessary.   A rehabilitation program following joint replacement surgery can speed recovery and prevent re-injury in the future due to weakened muscles. Contact your doctor or a physical therapist for more information on knee rehabilitation.    CONSTIPATION  Constipation is defined medically as fewer than three stools per week and severe constipation as less than one stool per week.  Even if you have a regular bowel pattern at home, your normal regimen is likely to be disrupted due to multiple reasons following surgery.  Combination of anesthesia, postoperative narcotics, change in appetite and fluid intake all can affect your bowels.   YOU MUST use at least one of the following options; they are listed in order of increasing strength to get the job done.  They are all available over the counter, and you may need to use some, POSSIBLY even all of these options:    Drink plenty of fluids (prune juice may be helpful) and high fiber foods Colace 100 mg by mouth twice a day  Senokot for constipation as directed and as needed Dulcolax (bisacodyl), take with full glass of  water  Miralax (polyethylene glycol) once or twice a day as needed.  If you have tried all these things and are unable to have a bowel movement in the first 3-4 days after surgery call either your surgeon or your primary doctor.    If you experience loose stools or diarrhea, hold the medications until you stool forms back up.  If your symptoms do not get better within 1 week or if they get worse, check with your doctor.  If you experience "the worst abdominal pain ever" or develop nausea or vomiting, please contact the office immediately for further recommendations for treatment.   ITCHING:  If you experience itching with your medications, try taking only a single pain pill, or even half a pain pill at a time.  You can also use Benadryl over the counter for itching or also to help with  sleep.   TED HOSE STOCKINGS:  Use stockings on both legs until for at least 2 weeks or as directed by physician office. They may be removed at night for sleeping.  MEDICATIONS:  See your medication summary on the "After Visit Summary" that nursing will review with you.  You may have some home medications which will be placed on hold until you complete the course of blood thinner medication.  It is important for you to complete the blood thinner medication as prescribed.  PRECAUTIONS:  If you experience chest pain or shortness of breath - call 911 immediately for transfer to the hospital emergency department.   If you develop a fever greater that 101 F, purulent drainage from wound, increased redness or drainage from wound, foul odor from the wound/dressing, or calf pain - CONTACT YOUR SURGEON.                                                   FOLLOW-UP APPOINTMENTS:  If you do not already have a post-op appointment, please call the office for an appointment to be seen by your surgeon.  Guidelines for how soon to be seen are listed in your "After Visit Summary", but are typically between 1-4 weeks after  surgery.  OTHER INSTRUCTIONS:   Knee Replacement:  Do not place pillow under knee, focus on keeping the knee straight while resting. CPM instructions: 0-90 degrees, 2 hours in the morning, 2 hours in the afternoon, and 2 hours in the evening. Place foam block, curve side up under heel at all times except when in CPM or when walking.  DO NOT modify, tear, cut, or change the foam block in any way.  POST-OPERATIVE OPIOID TAPER INSTRUCTIONS: It is important to wean off of your opioid medication as soon as possible. If you do not need pain medication after your surgery it is ok to stop day one. Opioids include: Codeine, Hydrocodone(Norco, Vicodin), Oxycodone(Percocet, oxycontin) and hydromorphone amongst others.  Long term and even short term use of opiods can cause: Increased pain response Dependence Constipation Depression Respiratory depression And more.  Withdrawal symptoms can include Flu like symptoms Nausea, vomiting And more Techniques to manage these symptoms Hydrate well Eat regular healthy meals Stay active Use relaxation techniques(deep breathing, meditating, yoga) Do Not substitute Alcohol to help with tapering If you have been on opioids for less than two weeks and do not have pain than it is ok to stop all together.  Plan to wean off of opioids This plan should start within one week post op of your joint replacement. Maintain the same interval or time between taking each dose and first decrease the dose.  Cut the total daily intake of opioids by one tablet each day Next start to increase the time between doses. The last dose that should be eliminated is the evening dose.   MAKE SURE YOU:  Understand these instructions.  Get help right away if you are not doing well or get worse.    Thank you for letting us be a part of your medical care team.  It is a privilege we respect greatly.  We hope these instructions will help you stay on track for a fast and full recovery!

## 2021-05-26 NOTE — Plan of Care (Signed)
  Problem: Activity: Goal: Ability to avoid complications of mobility impairment will improve Outcome: Progressing   Problem: Clinical Measurements: Goal: Postoperative complications will be avoided or minimized Outcome: Progressing   Problem: Pain Management: Goal: Pain level will decrease with appropriate interventions Outcome: Progressing   Problem: Skin Integrity: Goal: Will show signs of wound healing Outcome: Progressing   

## 2021-05-26 NOTE — Anesthesia Procedure Notes (Signed)
Anesthesia Regional Block: Adductor canal block   Pre-Anesthetic Checklist: , timeout performed,  Correct Patient, Correct Site, Correct Laterality,  Correct Procedure, Correct Position, site marked,  Risks and benefits discussed,  Surgical consent,  Pre-op evaluation,  At surgeon's request and post-op pain management  Laterality: Lower and Left  Prep: chloraprep       Needles:  Injection technique: Single-shot  Needle Type: Stimiplex     Needle Length: 9cm  Needle Gauge: 21     Additional Needles:   Procedures:,,,, ultrasound used (permanent image in chart),,    Narrative:  Start time: 05/26/2021 8:57 AM End time: 05/26/2021 9:08 AM Injection made incrementally with aspirations every 5 mL.  Performed by: Personally  Anesthesiologist: Lewie Loron, MD  Additional Notes: BP cuff, EKG monitors applied. Sedation begun. Artery and nerve location verified with ultrasound. Anesthetic injected incrementally (80ml), slowly, and after negative aspirations under direct u/s guidance. Good fascial/perineural spread. Tolerated well.

## 2021-05-26 NOTE — Anesthesia Procedure Notes (Addendum)
Spinal  Patient location during procedure: OR Start time: 05/26/2021 10:04 AM End time: 05/26/2021 10:07 AM Reason for block: surgical anesthesia Staffing Performed: resident/CRNA  Resident/CRNA: Ezekiel Ina, CRNA Preanesthetic Checklist Completed: patient identified, IV checked, site marked, risks and benefits discussed, surgical consent, monitors and equipment checked, pre-op evaluation and timeout performed Spinal Block Patient position: sitting Prep: DuraPrep Patient monitoring: heart rate, cardiac monitor, continuous pulse ox and blood pressure Approach: midline Location: L3-4 Injection technique: single-shot Needle Needle type: Pencan  Needle gauge: 24 G Needle length: 10 cm Assessment Sensory level: T6 Events: failed spinal and CSF return Additional Notes Functioning IV was confirmed and monitors were applied. Sterile prep and drape, including hand hygiene and sterile gloves were used. The patient was positioned and the spine was prepped. The skin was anesthetized with lidocaine.  Free flow of clear CSF was obtained prior to injecting local anesthetic into the CSF.  The spinal needle aspirated freely following injection.  The needle was carefully withdrawn.  The patient tolerated the procedure well. Patient grimacing with surgical stimulation. Spinal not adequate for surgical anesthesia

## 2021-05-26 NOTE — Op Note (Signed)
NAME:  Raven [name]                      MEDICAL RECORD NO.:  [id number]                             FACILITY:  [facility]      PHYSICIAN:  Madlyn Frankel. Charlann Boxer, M.D.  DATE OF BIRTH:  07/24/1955      DATE OF PROCEDURE:  05/26/2021                                     OPERATIVE REPORT         PREOPERATIVE DIAGNOSIS:  Left knee osteoarthritis.      POSTOPERATIVE DIAGNOSIS:  Left knee osteoarthritis.      FINDINGS:  The patient was noted to have complete loss of cartilage and   bone-on-bone arthritis with associated osteophytes in the patellofemoral and medial compartments of   the knee with a significant synovitis and associated effusion.  The patient had failed months of conservative treatment including medications, injection therapy, activity modification.     PROCEDURE:  Left total knee replacement.      COMPONENTS USED:  DePuy Attune rotating platform posterior stabilized knee   system, a size 5N femur, 5 tibia, size 6 mm PS AOX insert, and 35 anatomic patellar   button.      SURGEON:  Madlyn Frankel. Charlann Boxer, M.D.      ASSISTANT:  Rosalene Billings, PA-C.      ANESTHESIA:  General, Regional, and Spinal.      SPECIMENS:  None.      COMPLICATION:  None.      DRAINS:  None.  EBL: <100 cc      TOURNIQUET TIME:   Total Tourniquet Time Documented: Thigh (Left) - 34 minutes Total: Thigh (Left) - 34 minutes      The patient was stable to the recovery room.      INDICATION FOR PROCEDURE:  Raven [name] is a 65 y.o. female patient of   mine.  The patient had been seen, evaluated, and treated for months conservatively in the   office with medication, activity modification, and injections.  The patient had   radiographic changes of bone-on-bone arthritis with endplate sclerosis and osteophytes noted.  Based on the radiographic changes and failed conservative measures, the patient   decided to proceed with definitive treatment, total knee replacement.  Risks of infection, DVT, component  failure, need for revision surgery, neurovascular injury were reviewed in the office setting.  The postop course was reviewed stressing the efforts to maximize post-operative satisfaction and function.  Consent was obtained for benefit of pain   relief.      PROCEDURE IN DETAIL:  The patient was brought to the operative theater.   Once adequate anesthesia, preoperative antibiotics, 2 gm of Ancef,1 gm of Tranexamic Acid, and 10 mg of Decadron administered, the patient was positioned supine with a left thigh tourniquet placed.  The  left lower extremity was prepped and draped in sterile fashion.  A time-   out was performed identifying the patient, planned procedure, and the appropriate extremity.      The left lower extremity was placed in the [facility] leg holder.  The leg was   exsanguinated, tourniquet elevated to 250 mmHg.  A midline incision was   made  followed by median parapatellar arthrotomy.  Following initial   exposure, attention was first directed to the patella.  Precut   measurement was noted to be 24 mm.  I resected down to 13-14 mm and used a   35 anatomic patellar button to restore patellar height as well as cover the cut surface.      The lug holes were drilled and a metal shim was placed to protect the   patella from retractors and saw blade during the procedure.      At this point, attention was now directed to the femur.  The femoral   canal was opened with a drill, irrigated to try to prevent fat emboli.  An   intramedullary rod was passed at 3 degrees valgus, 11 mm of bone was   resected off the distal femur due to pre-operative flexion contracture.  Following this resection, the tibia was   subluxated anteriorly.  Using the extramedullary guide, 2 mm of bone was resected off   the proximal medial tibia.  We confirmed the gap would be   stable medially and laterally with a size 5 spacer block as well as confirmed that the tibial cut was perpendicular in the coronal plane,  checking with an alignment rod.      Once this was done, I sized the femur to be a size 5 in the anterior-   posterior dimension, chose a narrow component based on medial and   lateral dimension.  The size 5 rotation block was then pinned in   position anterior referenced using the C-clamp to set rotation.  The   anterior, posterior, and  chamfer cuts were made without difficulty nor   notching making certain that I was along the anterior cortex to help   with flexion gap stability.      The final box cut was made off the lateral aspect of distal femur.      At this point, the tibia was sized to be a size 5.  The size 5 tray was   then pinned in position through the medial third of the tubercle,   drilled, and keel punched.  Trial reduction was now carried with a 5 femur,  5 tibia, a size 6 mm PS insert, and the 35 anatomic patella botton.  The knee was brought to full extension with good flexion stability with the patella   tracking through the trochlea without application of pressure.  Given   all these findings the trial components removed.  Final components were   opened and cement was mixed.  The knee was irrigated with normal saline solution and pulse lavage.  The synovial lining was   then injected with 30 cc of 0.25% Marcaine with epinephrine, 1 cc of Toradol and 30 cc of NS for a total of 61 cc.     Final implants were then cemented onto cleaned and dried cut surfaces of bone with the knee brought to extension with a size 6 mm PS trial insert.      Once the cement had fully cured, excess cement was removed   throughout the knee.  I confirmed that I was satisfied with the range of   motion and stability, and the final size 6 mm PS AOX insert was chosen.  It was   placed into the knee.      The tourniquet had been let down at 34 minutes.  No significant   hemostasis was required.  The extensor mechanism was then  reapproximated using #1 Vicryl and #1 Stratafix sutures with the knee    in flexion.  The   remaining wound was closed with 2-0 Vicryl and running 4-0 Monocryl.   The knee was cleaned, dried, dressed sterilely using Dermabond and   Aquacel dressing.  The patient was then   brought to recovery room in stable condition, tolerating the procedure   well.   Please note that Physician Assistant, Rosalene Billings, PA-C was present for the entirety of the case, and was utilized for pre-operative positioning, peri-operative retractor management, general facilitation of the procedure and for primary wound closure at the end of the case.              Madlyn Frankel Charlann Boxer, M.D.    05/26/2021 11:27 AM

## 2021-05-27 ENCOUNTER — Encounter (HOSPITAL_COMMUNITY): Payer: Self-pay | Admitting: Orthopedic Surgery

## 2021-05-27 LAB — BASIC METABOLIC PANEL
Anion gap: 7 (ref 5–15)
BUN: 15 mg/dL (ref 8–23)
CO2: 21 mmol/L — ABNORMAL LOW (ref 22–32)
Calcium: 7.5 mg/dL — ABNORMAL LOW (ref 8.9–10.3)
Chloride: 107 mmol/L (ref 98–111)
Creatinine, Ser: 0.63 mg/dL (ref 0.44–1.00)
GFR, Estimated: >60 mL/min (ref >60)
Glucose, Bld: 207 mg/dL — ABNORMAL HIGH (ref 70–99)
Potassium: 3.7 mmol/L (ref 3.5–5.1)
Sodium: 135 mmol/L (ref 135–145)

## 2021-05-27 LAB — CBC
HCT: 25.1 % — ABNORMAL LOW (ref 36.0–46.0)
Hemoglobin: 8 g/dL — ABNORMAL LOW (ref 12.0–15.0)
MCH: 27.5 pg (ref 26.0–34.0)
MCHC: 31.9 g/dL (ref 30.0–36.0)
MCV: 86.3 fL (ref 80.0–100.0)
Platelets: 164 10*3/uL (ref 150–400)
RBC: 2.91 MIL/uL — ABNORMAL LOW (ref 3.87–5.11)
RDW: 13.3 % (ref 11.5–15.5)
WBC: 7.1 10*3/uL (ref 4.0–10.5)
nRBC: 0 % (ref 0.0–0.2)

## 2021-05-27 LAB — GLUCOSE, CAPILLARY: Glucose-Capillary: 152 mg/dL — ABNORMAL HIGH (ref 70–99)

## 2021-05-27 MED ORDER — DOCUSATE SODIUM 100 MG PO CAPS: 100.0000 mg | ORAL_CAPSULE | Freq: Two times a day (BID) | ORAL | 0 refills | Status: DC

## 2021-05-27 MED ORDER — CHLORHEXIDINE GLUCONATE CLOTH 2 % EX PADS: 6.0000 | MEDICATED_PAD | Freq: Every day | CUTANEOUS | Status: DC

## 2021-05-27 MED ORDER — ASPIRIN 81 MG PO CHEW: 81.0000 mg | CHEWABLE_TABLET | Freq: Two times a day (BID) | ORAL | 0 refills | Status: AC

## 2021-05-27 MED ORDER — POLYETHYLENE GLYCOL 3350 17 G PO PACK: 17.0000 g | PACK | Freq: Every day | ORAL | 0 refills | Status: DC | PRN

## 2021-05-27 MED ORDER — CELECOXIB 200 MG PO CAPS: 200.0000 mg | ORAL_CAPSULE | Freq: Two times a day (BID) | ORAL | 0 refills | Status: DC

## 2021-05-27 MED ORDER — METHOCARBAMOL 500 MG PO TABS
500.0000 mg | ORAL_TABLET | Freq: Four times a day (QID) | ORAL | 0 refills | Status: DC | PRN
Start: 2021-05-27 — End: 2021-11-02

## 2021-05-27 NOTE — Progress Notes (Signed)
Subjective: 1 Day Post-Op Procedure(s) (LRB): TOTAL KNEE ARTHROPLASTY (Left) Patient reports pain as mild.   Patient seen in rounds with Dr. Charlann Boxer. Patient is well, and has had no acute complaints or problems. No acute events overnight. Patient had just gotten up on her own to the bathroom. Foley catheter removed, voiding without difficulty. Patient ambulated 50 feet with PT.  We will continue therapy today.   Objective: Vital signs in last 24 hours: Temp:  [97.6 F (36.4 C)-98.8 F (37.1 C)] 98.2 F (36.8 C) (11/16 0540) Pulse Rate:  [66-92] 85 (11/16 0540) Resp:  [7-24] 16 (11/16 0540) BP: (100-156)/(57-83) 113/65 (11/16 0540) SpO2:  [97 %-100 %] 100 % (11/16 0540) Weight:  [64.5 kg] 64.5 kg (11/15 1850)  Intake/Output from previous day:  Intake/Output Summary (Last 24 hours) at 05/27/2021 0735 Last data filed at 05/27/2021 0540 Gross per 24 hour  Intake 3217.12 ml  Output 2800 ml  Net 417.12 ml     Intake/Output this shift: No intake/output data recorded.  Labs: Recent Labs    05/27/21 0318  HGB 8.0*   Recent Labs    05/27/21 0318  WBC 7.1  RBC 2.91*  HCT 25.1*  PLT 164   Recent Labs    05/27/21 0318  NA 135  K 3.7  CL 107  CO2 21*  BUN 15  CREATININE 0.63  GLUCOSE 207*  CALCIUM 7.5*   No results for input(s): LABPT, INR in the last 72 hours.  Exam: General - Patient is Alert and Oriented Extremity - Neurologically intact Sensation intact distally Intact pulses distally Dorsiflexion/Plantar flexion intact Dressing - dressing C/D/I Motor Function - intact, moving foot and toes well on exam.   Past Medical History:  Diagnosis Date   Acute medial meniscus tear of right knee    Adrenal insufficiency (Addison's disease) (HCC) 02/2021   Anemia of chronic disease    Mild, Hb stable consistently   Anxiety and depression    Chest pain 10/2017   +Cardiac CT.  Myocardial perfusion imaging NORMAL, EF>65%   Diabetes mellitus 1995   Managed by  Dr. Elvera Lennox (Endo)   GERD (gastroesophageal reflux disease) per pt watches diet and take tums as needed   with hx of prox esoph stricture + dilation procedure   H/O hiatal hernia    slightly larger ("moderate" size) on CT angio done to r/o PE 10/2017. noted on egd again 03/2021   History of adrenal insufficiency    in 2022 when she tried coming off her chronic daily prednisone for her RA   Hyperlipidemia    Hypertension    Interstitial cystitis    Lichen sclerosus of female genitalia    Migraine    Osteoarthritis, multiple sites    Palpitations 10/2017   3 wk event monitor: symptoms correlate with sinus rhythm with PACs, at times runs of PACs up to 5 beats.   Pseudogout of knee, left    colchicine helpful   Psoriasis    Seasonal allergies    Seropositive rheumatoid arthritis (HCC)    Hands, knees, jaw, elbow: responding to Simponi as of 09/29/15 rheum f/u.  Waning effectiveness on 03/2016 f/u so pt switched to Orencia injections.  Doing well on chronic low-dose prednisone therapy+ methotrexate +orencia as of 01/2019, 08/2019, 11/2019    Assessment/Plan: 1 Day Post-Op Procedure(s) (LRB): TOTAL KNEE ARTHROPLASTY (Left) Active Problems:   S/P total knee arthroplasty, left  Estimated body mass index is 21.31 kg/m as calculated from the following:  Height as of this encounter: 5' 8.5" (1.74 m).   Weight as of this encounter: 64.5 kg. Advance diet Up with therapy D/C IV fluids   Patient's anticipated LOS is less than 2 midnights, meeting these requirements: - Younger than 66 - Lives within 1 hour of care - Has a competent adult at home to recover with post-op recover - NO history of  - Chronic pain requiring opiods  - Diabetes  - Coronary Artery Disease  - Heart failure  - Heart attack  - Stroke  - DVT/VTE  - Cardiac arrhythmia  - Respiratory Failure/COPD  - Renal failure  - Anemia  - Advanced Liver disease     DVT Prophylaxis - Aspirin Weight bearing as  tolerated.  Plan is to go Home after hospital stay. Plan for discharge today following 1-2 sessions of PT as long as they are meeting their goals. Patient is scheduled for OPPT. Follow up in the office in 2 weeks.   Patient unfortunately does not tolerate opioids. We tried dilaudid yesterday, which caused dizziness for her. She does have one more dose of toradol to go, which should provide some relief. Otherwise, we will utilize tramadol, celebrex, tylenol, and robaxin.  Dennie Bible, PA-C Orthopedic Surgery [phone] 05/27/2021, 7:35 AM

## 2021-05-27 NOTE — Progress Notes (Signed)
Physical Therapy Treatment Patient Details Name: Raven [name] MRN: [id number] DOB: 1956-02-19 Today's Date: 05/27/2021   History of Present Illness Patient is 65 y.o. female s/p Lt TKA on 05/26/21 with PMH significant for Addison's disease, anxiety, depression, DM, GERD, HLD, HTN, psoriasis, RA, Rt TKA in 2013.    PT Comments    Pt progressing exceptionally well. Ready for d/c home from PT standpoint with family assist as needed.    Recommendations for follow up therapy are one component of a multi-disciplinary discharge planning process, led by the attending physician.  Recommendations may be updated based on patient status, additional functional criteria and insurance authorization.  Follow Up Recommendations  Follow physician's recommendations for discharge plan and follow up therapies     Assistance Recommended at Discharge Frequent or constant Supervision/Assistance  Equipment Recommendations  Rolling walker (2 wheels)    Recommendations for Other Services       Precautions / Restrictions Precautions Precautions: Fall Restrictions Weight Bearing Restrictions: No LLE Weight Bearing: Weight bearing as tolerated     Mobility  Bed Mobility Overal bed mobility: Needs Assistance Bed Mobility: Supine to Sit     Supine to sit: Supervision     General bed mobility comments: for safety    Transfers Overall transfer level: Needs assistance Equipment used: Rolling walker (2 wheels) Transfers: Sit to/from Stand Sit to Stand: Min guard;Supervision           General transfer comment: cues for hand placement and LLE position    Ambulation/Gait Ambulation/Gait assistance: Supervision Gait Distance (Feet): 130 Feet Assistive device: Rolling walker (2 wheels) Gait Pattern/deviations: Decreased stride length;Step-through pattern;Decreased weight shift to left Gait velocity: decr     General Gait Details: cues for sequence, knee flexion and heel strike on  L   Stairs Stairs: Yes   Stair Management: No rails;Step to pattern;Forwards;With walker Number of Stairs: 3 General stair comments: cues for sequence and RW position, min/guard for safety   Wheelchair Mobility    Modified Rankin (Stroke Patients Only)       Balance Overall balance assessment: Needs assistance Sitting-balance support: Feet supported Sitting balance-Leahy Scale: Good     Standing balance support: Reliant on assistive device for balance;During functional activity;Bilateral upper extremity supported Standing balance-Leahy Scale: Poor                              Cognition Arousal/Alertness: Awake/alert Behavior During Therapy: WFL for tasks assessed/performed Overall Cognitive Status: Within Functional Limits for tasks assessed                                          Exercises Total Joint Exercises Ankle Circles/Pumps: AROM;Both;Seated;10 reps Quad Sets: AROM;Left;10 reps;Seated Heel Slides: AAROM;Left;10 reps Straight Leg Raises: Left;10 reps;AAROM Goniometric ROM: ~12 to 80 degrees L knee flexion    General Comments        Pertinent Vitals/Pain Pain Assessment: 0-10 Pain Score: 3  Pain Location: Lt knee Pain Descriptors / Indicators: Aching;Discomfort Pain Intervention(s): Limited activity within patient's tolerance;Monitored during session;Premedicated before session;Repositioned    Home Living                          Prior Function            PT Goals (current goals can now be  found in the care plan section) Acute Rehab PT Goals Patient Stated Goal: regain independence PT Goal Formulation: With patient Time For Goal Achievement: 06/02/21 Potential to Achieve Goals: Good Progress towards PT goals: Progressing toward goals    Frequency    7X/week      PT Plan Current plan remains appropriate    Co-evaluation              AM-PAC PT "6 Clicks" Mobility   Outcome Measure   Help needed turning from your back to your side while in a flat bed without using bedrails?: A Little Help needed moving from lying on your back to sitting on the side of a flat bed without using bedrails?: A Little Help needed moving to and from a bed to a chair (including a wheelchair)?: A Little Help needed standing up from a chair using your arms (e.g., wheelchair or bedside chair)?: A Little Help needed to walk in hospital room?: A Little Help needed climbing 3-5 steps with a railing? : A Little 6 Click Score: 18    End of Session Equipment Utilized During Treatment: Gait belt Activity Tolerance: Patient tolerated treatment well Patient left: in chair;with chair alarm set;with call bell/phone within reach Nurse Communication: Mobility status PT Visit Diagnosis: Muscle weakness (generalized) (M62.81);Difficulty in walking, not elsewhere classified (R26.2)     Time: 1102-1117 PT Time Calculation (min) (ACUTE ONLY): 21 min  Charges:  $Gait Training: 8-22 mins                     Delice Bison, PT  Acute Rehab Dept ([facility]) [phone] Pager [phone]  05/27/2021    [facility] 05/27/2021, 9:58 AM

## 2021-05-27 NOTE — TOC Transition Note (Signed)
Transition of Care [facility]) - CM/SW Discharge Note  Patient Details  Name: Raven [name] MRN: [id number] Date of Birth: 10/12/1955  [facility] CM/SW Contact:  Sherie Don, LCSW Phone Number: 05/27/2021, 11:39 AM  Clinical Narrative: Patient is expected to discharge home after working with PT. CSW met with patient to confirm discharge plan. Patient will discharge home with OPPT at [facility] PT. Patient has a rolling walker, wheelchair, cane, 3N1, and shower stool at home so there are no DME needs at this time. TOC signing off.  Final next level of care: OP Rehab Barriers to Discharge: No Barriers Identified  Patient Goals and CMS Choice Patient states their goals for this hospitalization and ongoing recovery are:: Discharge home with [place] CMS Medicare.gov Compare Post Acute Care list provided to:: Patient Choice offered to / list presented to : NA  Discharge Plan and Services         DME Arranged: N/A DME Agency: NA  Readmission Risk Interventions No flowsheet data found.

## 2021-05-28 ENCOUNTER — Ambulatory Visit: Payer: 59 | Admitting: Internal Medicine

## 2021-06-08 NOTE — Discharge Summary (Signed)
Physician Discharge Summary   Patient ID: Raven [name] MRN: [id number] DOB/AGE: October 26, 1955 65 y.o.  Admit date: 05/26/2021 Discharge date: 05/27/2021  Primary Diagnosis: Left knee osteoarthritis.   Admission Diagnoses:  Past Medical History:  Diagnosis Date   Acute medial meniscus tear of right knee    Adrenal insufficiency (Addison's disease) (HCC) 02/2021   Anemia of chronic disease    Mild, Hb stable consistently   Anxiety and depression    Chest pain 10/2017   +Cardiac CT.  Myocardial perfusion imaging NORMAL, EF>65%   Diabetes mellitus 1995   Managed by Dr. Elvera Lennox (Endo)   GERD (gastroesophageal reflux disease) per pt watches diet and take tums as needed   with hx of prox esoph stricture + dilation procedure   H/O hiatal hernia    slightly larger ("moderate" size) on CT angio done to r/o PE 10/2017. noted on egd again 03/2021   History of adrenal insufficiency    in 2022 when she tried coming off her chronic daily prednisone for her RA   Hyperlipidemia    Hypertension    Interstitial cystitis    Lichen sclerosus of female genitalia    Migraine    Osteoarthritis, multiple sites    Palpitations 10/2017   3 wk event monitor: symptoms correlate with sinus rhythm with PACs, at times runs of PACs up to 5 beats.   Pseudogout of knee, left    colchicine helpful   Psoriasis    Seasonal allergies    Seropositive rheumatoid arthritis (HCC)    Hands, knees, jaw, elbow: responding to Simponi as of 09/29/15 rheum f/u.  Waning effectiveness on 03/2016 f/u so pt switched to Orencia injections.  Doing well on chronic low-dose prednisone therapy+ methotrexate +orencia as of 01/2019, 08/2019, 11/2019   Discharge Diagnoses:   Active Problems:   S/P total knee arthroplasty, left  Estimated body mass index is 21.31 kg/m as calculated from the following:   Height as of this encounter: 5' 8.5" (1.74 m).   Weight as of this encounter: 64.5 kg.  Procedure:  Procedure(s) (LRB): TOTAL  KNEE ARTHROPLASTY (Left)   Consults: None  HPI: Raven [name] is a 65 y.o. female patient of   mine.  The patient had been seen, evaluated, and treated for months conservatively in the   office with medication, activity modification, and injections.  The patient had   radiographic changes of bone-on-bone arthritis with endplate sclerosis and osteophytes noted.  Based on the radiographic changes and failed conservative measures, the patient   decided to proceed with definitive treatment, total knee replacement.  Risks of infection, DVT, component failure, need for revision surgery, neurovascular injury were reviewed in the office setting.  The postop course was reviewed stressing the efforts to maximize post-operative satisfaction and function.  Consent was obtained for benefit of pain   relief.      Laboratory Data: Admission on 05/26/2021, Discharged on 05/27/2021  Component Date Value Ref Range Status   Glucose-Capillary 05/26/2021 232 (H)  70 - 99 mg/dL Final   Glucose reference range applies only to samples taken after fasting for at least 8 hours.   Glucose-Capillary 05/26/2021 169 (H)  70 - 99 mg/dL Final   Glucose reference range applies only to samples taken after fasting for at least 8 hours.   WBC 05/27/2021 7.1  4.0 - 10.5 K/uL Final   RBC 05/27/2021 2.91 (L)  3.87 - 5.11 MIL/uL Final   Hemoglobin 05/27/2021 8.0 (L)  12.0 - 15.0 g/dL  Final   HCT 05/27/2021 25.1 (L)  36.0 - 46.0 % Final   MCV 05/27/2021 86.3  80.0 - 100.0 fL Final   MCH 05/27/2021 27.5  26.0 - 34.0 pg Final   MCHC 05/27/2021 31.9  30.0 - 36.0 g/dL Final   RDW 95/18/8416 13.3  11.5 - 15.5 % Final   Platelets 05/27/2021 164  150 - 400 K/uL Final   nRBC 05/27/2021 0.0  0.0 - 0.2 % Final   Performed at [facility], [address], [place], [place] [address]   Sodium 05/27/2021 135  135 - 145 mmol/L Final   Potassium 05/27/2021 3.7  3.5 - 5.1 mmol/L Final   Chloride 05/27/2021 107  98 -  111 mmol/L Final   CO2 05/27/2021 21 (L)  22 - 32 mmol/L Final   Glucose, Bld 05/27/2021 207 (H)  70 - 99 mg/dL Final   Glucose reference range applies only to samples taken after fasting for at least 8 hours.   BUN 05/27/2021 15  8 - 23 mg/dL Final   Creatinine, Ser 05/27/2021 0.63  0.44 - 1.00 mg/dL Final   Calcium 16/07/930 7.5 (L)  8.9 - 10.3 mg/dL Final   GFR, Estimated 05/27/2021 >60  >60 mL/min Final   Comment: (NOTE) Calculated using the CKD-EPI Creatinine Equation (2021)    Anion gap 05/27/2021 7  5 - 15 Final   Performed at [facility], [address], [place], [place] [address]   Glucose-Capillary 05/26/2021 248 (H)  70 - 99 mg/dL Final   Glucose reference range applies only to samples taken after fasting for at least 8 hours.   Glucose-Capillary 05/27/2021 152 (H)  70 - 99 mg/dL Final   Glucose reference range applies only to samples taken after fasting for at least 8 hours.  Orders Only on 05/22/2021  Component Date Value Ref Range Status   SARS Coronavirus 2 05/22/2021 RESULT: NEGATIVE   Final   Comment: RESULT: NEGATIVESARS-CoV-2 INTERPRETATION:A NEGATIVE  test result means that SARS-CoV-2 RNA was not present in the specimen above the limit of detection of this test. This does not preclude a possible SARS-CoV-2 infection and should not be used as the  sole basis for patient management decisions. Negative results must be combined with clinical observations, patient history, and epidemiological information. Optimum specimen types and timing for peak viral levels during infections caused by SARS-CoV-2  have not been determined. Collection of multiple specimens or types of specimens may be necessary to detect virus. Improper specimen collection and handling, sequence variability under primers/probes, or organism present below the limit of detection may  lead to false negative results. Positive and negative predictive values of testing are highly dependent on  prevalence. False negative test results are more likely when prevalence of disease is high.The expected result is NEGATIVE.Fact S                          heet for  Healthcare Providers: [employer] Sheet for Patients: https://poole-freeman.org/ Reference Range - Negative   Hospital Outpatient Visit on 05/18/2021  Component Date Value Ref Range Status   Glucose-Capillary 05/18/2021 213 (H)  70 - 99 mg/dL Final   Glucose reference range applies only to samples taken after fasting for at least 8 hours.   WBC 05/18/2021 5.1  4.0 - 10.5 K/uL Final   RBC 05/18/2021 3.75 (L)  3.87 - 5.11 MIL/uL Final   Hemoglobin 05/18/2021 10.3 (L)  12.0 - 15.0  g/dL Final   HCT 81/19/1478 32.5 (L)  36.0 - 46.0 % Final   MCV 05/18/2021 86.7  80.0 - 100.0 fL Final   MCH 05/18/2021 27.5  26.0 - 34.0 pg Final   MCHC 05/18/2021 31.7  30.0 - 36.0 g/dL Final   RDW 29/56/2130 13.9  11.5 - 15.5 % Final   Platelets 05/18/2021 238  150 - 400 K/uL Final   nRBC 05/18/2021 0.0  0.0 - 0.2 % Final   Performed at [facility], [address] [place], [place] [address]   Sodium 05/18/2021 139  135 - 145 mmol/L Final   Potassium 05/18/2021 4.3  3.5 - 5.1 mmol/L Final   Chloride 05/18/2021 106  98 - 111 mmol/L Final   CO2 05/18/2021 26  22 - 32 mmol/L Final   Glucose, Bld 05/18/2021 219 (H)  70 - 99 mg/dL Final   Glucose reference range applies only to samples taken after fasting for at least 8 hours.   BUN 05/18/2021 17  8 - 23 mg/dL Final   Creatinine, Ser 05/18/2021 0.99  0.44 - 1.00 mg/dL Final   Calcium 46/96/2952 8.7 (L)  8.9 - 10.3 mg/dL Final   Total Protein 84/13/2440 6.9  6.5 - 8.1 g/dL Final   Albumin 05/08/2535 3.5  3.5 - 5.0 g/dL Final   AST 64/40/3474 31  15 - 41 U/L Final   ALT 05/18/2021 27  0 - 44 U/L Final   Alkaline Phosphatase 05/18/2021 53  38 - 126 U/L Final   Total Bilirubin 05/18/2021 0.4  0.3 - 1.2 mg/dL Final   GFR,  Estimated 05/18/2021 >60  >60 mL/min Final   Comment: (NOTE) Calculated using the CKD-EPI Creatinine Equation (2021)    Anion gap 05/18/2021 7  5 - 15 Final   Performed at [facility], [address], [place], [place] [address]   ABO/RH(D) 05/18/2021 A POS   Final   Antibody Screen 05/18/2021 NEG   Final   Sample Expiration 05/18/2021 05/29/2021,2359   Final   Extend sample reason 05/18/2021    Final                   Value:NO TRANSFUSIONS OR PREGNANCY IN THE PAST 3 MONTHS Performed at [facility], [address] [place], [place] [address]    MRSA, PCR 05/18/2021 NEGATIVE  NEGATIVE Final   Staphylococcus aureus 05/18/2021 NEGATIVE  NEGATIVE Final   Comment: (NOTE) The Xpert SA Assay (FDA approved for NASAL specimens in patients 16 years of age and older), is one component of a comprehensive surveillance program. It is not intended to diagnose infection nor to guide or monitor treatment. Performed at [facility], [address], [place], [place] [address]   Office Visit on 04/30/2021  Component Date Value Ref Range Status   Hemoglobin A1C 04/30/2021 5.6  4.0 - 5.6 % Final     X-Rays:US THYROID  Result Date: 05/11/2021 CLINICAL DATA:  Incidental on CT.  Thyroid nodule identified by CT. EXAM: THYROID ULTRASOUND TECHNIQUE: Ultrasound examination of the thyroid gland and adjacent soft tissues was performed. COMPARISON:  Report from a CT of the neck dated 03/13/2021 at [facility] FINDINGS: Parenchymal Echotexture: Mildly heterogenous Isthmus: 0.4 cm Right lobe: 4.6 x 1.9 x 1.7 cm Left lobe: 3.0 x 1.0 x 1.3 cm _________________________________________________________ Estimated total number of nodules >/= 1 cm: 1 Number of spongiform nodules >/=  2 cm not described below (TR1): 0 Number of mixed  cystic and solid nodules >/= 1.5 cm not described below (TR2): 0 _________________________________________________________ Nodule # 1:  Location: Isthmus Maximum size: 0.5 cm; Other 2 dimensions: 0.4 x 0.3 cm Composition: solid/almost completely solid (2) Echogenicity: hypoechoic (2) Shape: not taller-than-wide (0) Margins: smooth (0) Echogenic foci: none (0) ACR TI-RADS total points: 4. ACR TI-RADS risk category: TR4 (4-6 points). ACR TI-RADS recommendations: Given size (<0.9 cm) and appearance, this nodule does NOT meet TI-RADS criteria for biopsy or dedicated follow-up. ________________________________________________________ Nodule # 2: Location: Right; Mid Maximum size: 1.5 cm; Other 2 dimensions: 1.3 x 1.4 cm Composition: solid/almost completely solid (2) Echogenicity: hyperechoic (1) Shape: not taller-than-wide (0) Margins: smooth (0) Echogenic foci: macrocalcifications (1) ACR TI-RADS total points: 4. ACR TI-RADS risk category: TR4 (4-6 points). ACR TI-RADS recommendations: **Given size (>/= 1.5 cm) and appearance, fine needle aspiration of this moderately suspicious nodule should be considered based on TI-RADS criteria. Nodule # 3: Location: Left; Mid Maximum size: 0.5 cm; Other 2 dimensions: 0.4 x 0.4 cm Composition: solid/almost completely solid (2) Echogenicity: hypoechoic (2) Shape: not taller-than-wide (0) Margins: smooth (0) Echogenic foci: none (0) ACR TI-RADS total points: 4. ACR TI-RADS risk category: TR4 (4-6 points). ACR TI-RADS recommendations: Given size (<0.9 cm) and appearance, this nodule does NOT meet TI-RADS criteria for biopsy or dedicated follow-up. _________________________________________________________ Small nonenlarged bilateral cervical lymph nodes demonstrate normal lymph node architecture. IMPRESSION: 1. 1.5 cm right mid thyroid nodule (nodule 2) just meets criteria for fine-needle aspiration. 2. 0.5 cm isthmus and 0.5 cm left thyroid nodules do not meet criteria for biopsy or dedicated follow-up. The above is in keeping with the ACR TI-RADS recommendations - J Am Coll Radiol 2017;14:587-595. Electronically  Signed   By: Irish Lack M.D.   On: 05/11/2021 11:19    EKG: Orders placed or performed during the hospital encounter of 05/18/21   EKG 12 lead per protocol   EKG 12-Lead   EKG 12-Lead   EKG 12 lead per protocol     Hospital Course: Raven [name] is a 65 y.o. who was admitted to [facility]. They were brought to the operating room on 05/26/2021 and underwent Procedure(s): TOTAL KNEE ARTHROPLASTY.  Patient tolerated the procedure well and was later transferred to the recovery room and then to the orthopaedic floor for postoperative care. They were given PO and IV analgesics for pain control following their surgery. They were given 24 hours of postoperative antibiotics of  Anti-infectives (From admission, onward)    Start     Dose/Rate Route Frequency Ordered Stop   05/26/21 1600  ceFAZolin (ANCEF) IVPB 2g/100 mL premix        2 g 200 mL/hr over 30 Minutes Intravenous Every 6 hours 05/26/21 1514 05/27/21 0812   05/26/21 1138  vancomycin (VANCOCIN) 1-5 GM/200ML-% IVPB       Note to Pharmacy: Sharyn Creamer   : cabinet override      05/26/21 1138 05/26/21 2344   05/26/21 0745  ceFAZolin (ANCEF) IVPB 2g/100 mL premix        2 g 200 mL/hr over 30 Minutes Intravenous On call to O.R. 05/26/21 0734 05/26/21 1010      and started on DVT prophylaxis in the form of Aspirin.   PT and OT were ordered for total joint protocol. Discharge planning consulted to help with postop disposition and equipment needs.  Patient had a good night on the evening of surgery. They started to get up OOB with therapy on POD #0. Pt was  seen during rounds and was ready to go home pending progress with therapy.She worked with therapy on POD #1 and was meeting her goals. Pt was discharged to home later that day in stable condition.  Diet: Regular diet Activity: WBAT Follow-up: in 2 weeks Disposition: Home Discharged Condition: good   Discharge Instructions     Call MD / Call 911   Complete by: As  directed    If you experience chest pain or shortness of breath, CALL 911 and be transported to the hospital emergency room.  If you develope a fever above 101 F, pus (white drainage) or increased drainage or redness at the wound, or calf pain, call your surgeon's office.   Change dressing   Complete by: As directed    Maintain surgical dressing until follow up in the clinic. If the edges start to pull up, may reinforce with tape. If the dressing is no longer working, may remove and cover with gauze and tape, but must keep the area dry and clean.  Call with any questions or concerns.   Constipation Prevention   Complete by: As directed    Drink plenty of fluids.  Prune juice may be helpful.  You may use a stool softener, such as Colace (over the counter) 100 mg twice a day.  Use MiraLax (over the counter) for constipation as needed.   Diet - low sodium heart healthy   Complete by: As directed    Increase activity slowly as tolerated   Complete by: As directed    Weight bearing as tolerated with assist device (walker, cane, etc) as directed, use it as long as suggested by your surgeon or therapist, typically at least 4-6 weeks.   Post-operative opioid taper instructions:   Complete by: As directed    POST-OPERATIVE OPIOID TAPER INSTRUCTIONS: It is important to wean off of your opioid medication as soon as possible. If you do not need pain medication after your surgery it is ok to stop day one. Opioids include: Codeine, Hydrocodone(Norco, Vicodin), Oxycodone(Percocet, oxycontin) and hydromorphone amongst others.  Long term and even short term use of opiods can cause: Increased pain response Dependence Constipation Depression Respiratory depression And more.  Withdrawal symptoms can include Flu like symptoms Nausea, vomiting And more Techniques to manage these symptoms Hydrate well Eat regular healthy meals Stay active Use relaxation techniques(deep breathing, meditating, yoga) Do  Not substitute Alcohol to help with tapering If you have been on opioids for less than two weeks and do not have pain than it is ok to stop all together.  Plan to wean off of opioids This plan should start within one week post op of your joint replacement. Maintain the same interval or time between taking each dose and first decrease the dose.  Cut the total daily intake of opioids by one tablet each day Next start to increase the time between doses. The last dose that should be eliminated is the evening dose.      TED hose   Complete by: As directed    Use stockings (TED hose) for 2 weeks on both leg(s).  You may remove them at night for sleeping.      Allergies as of 05/27/2021       Reactions   Steri-strip Compound Benzoin [benzoin Compound] Anaphylaxis, Itching   Blisters and itching   Penicillins Rash   Tolerated Cephalosporin 05/26/21.   Pravastatin    Myalgias   Simvastatin    Myalgias   Hydrocodone-acetaminophen Nausea And Vomiting  Medication List     STOP taking these medications    diclofenac Sodium 1 % Gel Commonly known as: VOLTAREN   ibuprofen 200 MG tablet Commonly known as: ADVIL   leflunomide 20 MG tablet Commonly known as: ARAVA   Orencia 125 MG/ML Sosy Generic drug: Abatacept       TAKE these medications    acetaminophen 500 MG tablet Commonly known as: TYLENOL Take 1,000 mg by mouth every 6 (six) hours as needed for moderate pain.   amphetamine-dextroamphetamine 15 MG 24 hr capsule Commonly known as: ADDERALL XR TAKE ONE CAPSULE BY MOUTH EVERY MORNING   aspirin 81 MG chewable tablet Chew 1 tablet (81 mg total) by mouth 2 (two) times daily for 28 days.   celecoxib 200 MG capsule Commonly known as: CELEBREX Take 1 capsule (200 mg total) by mouth 2 (two) times daily.   cetirizine 10 MG tablet Commonly known as: ZYRTEC Take 10 mg by mouth daily.   Colchicine 0.6 MG Caps Take 1.2 mg by mouth at bedtime.   diazepam 5 MG  tablet Commonly known as: VALIUM TAKE ONE TABLET BY MOUTH EVERY TWELVE HOURS AS NEEDED FOR MIGRAINE HEADACHES   docusate sodium 100 MG capsule Commonly known as: COLACE Take 1 capsule (100 mg total) by mouth 2 (two) times daily.   folic acid 1 MG tablet Commonly known as: FOLVITE Take 1 mg by mouth at bedtime.   lisinopril 20 MG tablet Commonly known as: ZESTRIL Take 0.5 tablets (10 mg total) by mouth daily. What changed: when to take this   metFORMIN 500 MG 24 hr tablet Commonly known as: GLUCOPHAGE-XR Take 2 tablets (1,000 mg total) by mouth 2 (two) times daily with a meal.   methocarbamol 500 MG tablet Commonly known as: ROBAXIN Take 1 tablet (500 mg total) by mouth every 6 (six) hours as needed for muscle spasms.   methotrexate 50 MG/2ML injection Inject 22.5 mg into the muscle every Monday.   metoprolol tartrate 25 MG tablet Commonly known as: LOPRESSOR Take 0.5 tablets (12.5 mg total) by mouth 2 (two) times daily.   omeprazole 40 MG capsule Commonly known as: PRILOSEC Take 1 capsule (40 mg total) by mouth 2 (two) times daily.   phenazopyridine 95 MG tablet Commonly known as: PYRIDIUM Take 190 mg by mouth 2 (two) times daily as needed for pain.   polyethylene glycol 17 g packet Commonly known as: MIRALAX / GLYCOLAX Take 17 g by mouth daily as needed for mild constipation.   predniSONE 5 MG tablet Commonly known as: DELTASONE Take 1-2 tablets (5-10 mg total) by mouth daily. What changed:  how much to take when to take this   primidone 50 MG tablet Commonly known as: MYSOLINE Take 50-100 mg by mouth See admin instructions. Take 50 mg by mouth in the morning and 100 mg by mouth in the evening.   promethazine 12.5 MG tablet Commonly known as: PHENERGAN 1-2 tabs po q6h prn nausea   REFRESH OP Place 1 drop into both eyes 2 (two) times daily.   rosuvastatin 20 MG tablet Commonly known as: CRESTOR Take 1 tablet (20 mg total) by mouth daily. What changed:  when to take this   sertraline 100 MG tablet Commonly known as: ZOLOFT TAKE ONE TABLET BY MOUTH EVERY DAY   valACYclovir 1000 MG tablet Commonly known as: VALTREX 1 tab po tid x 7d               Discharge Care Instructions  (From  admission, onward)           Start     Ordered   05/27/21 0000  Change dressing       Comments: Maintain surgical dressing until follow up in the clinic. If the edges start to pull up, may reinforce with tape. If the dressing is no longer working, may remove and cover with gauze and tape, but must keep the area dry and clean.  Call with any questions or concerns.   05/27/21 1700            Follow-up Information     Durene Romans, MD Follow up.   Specialty: Orthopedic Surgery Contact information: [address] [place] [address] [place] [address] [phone]                 Signed: Dennie Bible, PA-C Orthopedic Surgery 06/08/2021, 2:25 PM

## 2021-06-11 ENCOUNTER — Ambulatory Visit: Payer: Self-pay | Admitting: Internal Medicine

## 2021-07-09 ENCOUNTER — Other Ambulatory Visit: Payer: Self-pay

## 2021-07-09 DIAGNOSIS — B0223 Postherpetic polyneuropathy: Secondary | ICD-10-CM | POA: Insufficient documentation

## 2021-07-15 ENCOUNTER — Ambulatory Visit: Payer: 59 | Admitting: Family Medicine

## 2021-07-15 NOTE — Progress Notes (Deleted)
OFFICE VISIT  07/15/2021  CC: No chief complaint on file.  HPI:    Patient is a 66 y.o. female who presents for f/u 3 mo f/u HTN, HLD, and adult ADD. A/P as of last visit: "1) Hypotension: resolved --her fluid loss with diarrhea has stopped and she has been off her lisinopril.  Diastolic BPs now back up some into mild htn range but she'll hold off on restart of lisin until systolics consistently climb into upper 130s or so.   2) Shingles: suspected.  See hpi. She is immunosuppressed d/t meds. Start valtrex 1g tid x 7d.   3) DM: control historically has been good but recent preop (TKA surg) check of a1c was up over 8% so they rescheduled her surgery.  Subsequent a1c recheck at Dr. Arman Filter 1 week later was 7.7%.  She'll f/u with Dr. Cruzita Lederer as appropriate."  INTERIM HX: She got L TKA on 05/26/21.  Post-op Hb was 8.0.  Pt states all is going well with the med at current dosing: much improved focus, concentration, task completion.  Less frustration, better multitasking, less impulsivity and restlessness.  Mood is stable. No side effects from the medication. PMP AWARE reviewed today: most recent rx for adderall Xr 15mg  was filled 05/13/21, # 30, rx by me. No red flags.  Past Medical History:  Diagnosis Date   Acute medial meniscus tear of right knee    Adrenal insufficiency (Addison's disease) ([place]) 02/2021   Anemia of chronic disease    Mild, Hb stable consistently   Anxiety and depression    Chest pain 10/2017   +Cardiac CT.  Myocardial perfusion imaging NORMAL, EF>65%   Diabetes mellitus 1995   Managed by Dr. Cruzita Lederer (Endo)   GERD (gastroesophageal reflux disease) per pt watches diet and take tums as needed   with hx of prox esoph stricture + dilation procedure   H/O hiatal hernia    slightly larger ("moderate" size) on CT angio done to r/o PE 10/2017. noted on egd again 03/2021   History of adrenal insufficiency    in 2022 when she tried coming off her chronic daily prednisone  for her RA   Hyperlipidemia    Hypertension    Interstitial cystitis    Lichen sclerosus of female genitalia    Migraine    Osteoarthritis, multiple sites    Palpitations 10/2017   3 wk event monitor: symptoms correlate with sinus rhythm with PACs, at times runs of PACs up to 5 beats.   Pseudogout of knee, left    colchicine helpful   Psoriasis    Seasonal allergies    Seropositive rheumatoid arthritis ([place])    Hands, knees, jaw, elbow: responding to Simponi as of 09/29/15 rheum f/u.  Waning effectiveness on 03/2016 f/u so pt switched to Orencia injections.  Doing well on chronic low-dose prednisone therapy+ methotrexate +orencia as of 01/2019, 08/2019, 11/2019    Past Surgical History:  Procedure Laterality Date   ABDOMINAL HYSTERECTOMY  1991   for fibroids; ovaries still in   BIOPSY  03/17/2021   Procedure: BIOPSY;  Surgeon: Otis Brace, MD;  Location: [facility];  Service: Gastroenterology;;   BREAST BIOPSY  2001; 02/12/16   fibrocystic breast disease in 2001 and again in 02/2016 (+scar from prev bx site w/calcifications).   CARDIOVASCULAR STRESS TEST  11/01/2017   Myocard perf imaging: NORMAL (EF >60-65%)   COLONOSCOPY  09/12/2013; 02/25/20   Normal 2015 and 2021: Recall 5 yrs ([facility], Dr. Penelope Coop) due to [place] of  colon polyps.   CYSTOSCOPY  2004   for chronic UTI   DEXA  10/2016   Bone density normal (T-score 0.0)   ESOPHAGOGASTRODUODENOSCOPY  04/19/2011   Nl except antral gastritis: bx = mild chron gastritis (H pyloria neg)    ESOPHAGOGASTRODUODENOSCOPY (EGD) WITH PROPOFOL N/A 03/27/2020   Esoph stenosis-->dilated.  Mod size hiatal hernia ([facility]). Procedure: ESOPHAGOGASTRODUODENOSCOPY (EGD) WITH PROPOFOL ;  Surgeon: Wonda Horner, MD;  Location: [facility];  Service: Endoscopy;  Laterality: N/A;   ESOPHAGOGASTRODUODENOSCOPY (EGD) WITH PROPOFOL N/A 03/17/2021   Esoph stricture->dilated.  Large hiatal hernia. Also +esoph candidiasis, bx inflammation.  Procedure:  ESOPHAGOGASTRODUODENOSCOPY (EGD) WITH PROPOFOL;  Surgeon: Otis Brace, MD;  Location: [facility];  Service: Gastroenterology;  Laterality: N/A;   EVENT MONITOR  10/2017   3 wk-->Symptoms correlate with sinus rhythm with PACs, at times runs of PACs up to 5 beats.   GALLBLADDER SURGERY  2019   intraarticular steroid injection  2013   3 R knee & L X 1; Dr Rushie Nyhan   KNEE ARTHROCENTESIS  2013   R knee x 3 & L X 1   KNEE ARTHROSCOPY  10/21/2011   Procedure: ARTHROSCOPY KNEE;  Surgeon: Tobi Bastos, MD;  Location: [facility];  Service: Orthopedics;  Laterality: Right;  WITH MEDIAL and lateral shaving of femoral chondyl  with microfracture technique of lateral and medial femoral chondyl suprapatellar synovectomy   LAPAROSCOPIC CHOLECYSTECTOMY  01/2018   SAVORY DILATION N/A 03/27/2020   Procedure: SAVORY DILATION;  Surgeon: Wonda Horner, MD;  Location: [facility];  Service: Endoscopy;  Laterality: N/A;   TOTAL KNEE ARTHROPLASTY  04/12/2012   RIGHT  Procedure: TOTAL KNEE ARTHROPLASTY;  Surgeon: Tobi Bastos, MD;  Location: [facility];  Service: Orthopedics;  Laterality: Right;  Right Total Knee Arthroplasty   TOTAL KNEE ARTHROPLASTY Left 05/26/2021   Procedure: TOTAL KNEE ARTHROPLASTY;  Surgeon: Paralee Cancel, MD;  Location: [facility];  Service: Orthopedics;  Laterality: Left;   TUBAL LIGATION  1981    Outpatient Medications Prior to Visit  Medication Sig Dispense Refill   acetaminophen (TYLENOL) 500 MG tablet Take 1,000 mg by mouth every 6 (six) hours as needed for moderate pain.     amphetamine-dextroamphetamine (ADDERALL XR) 15 MG 24 hr capsule TAKE ONE CAPSULE BY MOUTH EVERY MORNING 30 capsule 0   celecoxib (CELEBREX) 200 MG capsule Take 1 capsule (200 mg total) by mouth 2 (two) times daily. 60 capsule 0   cetirizine (ZYRTEC) 10 MG tablet Take 10 mg by mouth daily.     Colchicine 0.6 MG CAPS Take 1.2 mg by mouth at bedtime.     diazepam (VALIUM) 5 MG tablet TAKE ONE  TABLET BY MOUTH EVERY TWELVE HOURS AS NEEDED FOR MIGRAINE HEADACHES 60 tablet 5   docusate sodium (COLACE) 100 MG capsule Take 1 capsule (100 mg total) by mouth 2 (two) times daily. 10 capsule 0   folic acid (FOLVITE) 1 MG tablet Take 1 mg by mouth at bedtime.     leflunomide (ARAVA) 10 MG tablet 1 tablet     lisinopril (ZESTRIL) 20 MG tablet Take 0.5 tablets (10 mg total) by mouth daily. (Patient taking differently: Take 10 mg by mouth every other day.) 45 tablet 3   metFORMIN (GLUCOPHAGE-XR) 500 MG 24 hr tablet Take 2 tablets (1,000 mg total) by mouth 2 (two) times daily with a meal. 360 tablet 3   methocarbamol (ROBAXIN) 500 MG tablet Take 1 tablet (500 mg total)  by mouth every 6 (six) hours as needed for muscle spasms. 40 tablet 0   methotrexate 50 MG/2ML injection Inject 22.5 mg into the muscle every Monday.     metoprolol tartrate (LOPRESSOR) 25 MG tablet Take 0.5 tablets (12.5 mg total) by mouth 2 (two) times daily. 90 tablet 3   omeprazole (PRILOSEC) 40 MG capsule Take 1 capsule (40 mg total) by mouth 2 (two) times daily. 60 capsule 3   phenazopyridine (PYRIDIUM) 95 MG tablet Take 190 mg by mouth 2 (two) times daily as needed for pain.     polyethylene glycol (MIRALAX / GLYCOLAX) 17 g packet Take 17 g by mouth daily as needed for mild constipation. 14 each 0   Polyvinyl Alcohol-Povidone (REFRESH OP) Place 1 drop into both eyes 2 (two) times daily.     predniSONE (DELTASONE) 5 MG tablet Take 1-2 tablets (5-10 mg total) by mouth daily. (Patient taking differently: Take 5 mg by mouth daily with breakfast.) 180 tablet 3   primidone (MYSOLINE) 50 MG tablet Take 50-100 mg by mouth See admin instructions. Take 50 mg by mouth in the morning and 100 mg by mouth in the evening.     promethazine (PHENERGAN) 12.5 MG tablet 1-2 tabs po q6h prn nausea 30 tablet 2   rosuvastatin (CRESTOR) 20 MG tablet Take 1 tablet (20 mg total) by mouth daily. (Patient taking differently: Take 20 mg by mouth at bedtime.)  90 tablet 3   sertraline (ZOLOFT) 100 MG tablet TAKE ONE TABLET BY MOUTH EVERY DAY 90 tablet 3   valACYclovir (VALTREX) 1000 MG tablet 1 tab po tid x 7d (Patient not taking: No sig reported) 21 tablet 0   No facility-administered medications prior to visit.    Allergies  Allergen Reactions   Steri-Strip Compound Benzoin [Benzoin Compound] Anaphylaxis and Itching    Blisters and itching   Penicillins Rash    Tolerated Cephalosporin 05/26/21.     Pravastatin     Myalgias   Simvastatin     Myalgias   Hydrocodone-Acetaminophen Nausea And Vomiting   Hydroxychloroquine Sulfate Rash    ROS As per HPI  PE: Vitals with BMI 05/27/2021 05/27/2021 05/27/2021  Height - - -  Weight - - -  BMI - - -  Systolic [id number]  Diastolic 70 65 57  Pulse 87 85 72     Physical Exam  ***  LABS:  Lab Results  Component Value Date   TSH 1.37 11/09/2019   Lab Results  Component Value Date   WBC 7.1 05/27/2021   HGB 8.0 (L) 05/27/2021   HCT 25.1 (L) 05/27/2021   MCV 86.3 05/27/2021   PLT 164 05/27/2021  No results found for: IRON, TIBC, FERRITIN No results found for: VITAMINB12  Lab Results  Component Value Date   CREATININE 0.63 05/27/2021   BUN 15 05/27/2021   NA 135 05/27/2021   K 3.7 05/27/2021   CL 107 05/27/2021   CO2 21 (L) 05/27/2021   Lab Results  Component Value Date   ALT 27 05/18/2021   AST 31 05/18/2021   ALKPHOS 53 05/18/2021   BILITOT 0.4 05/18/2021   Lab Results  Component Value Date   CHOL 108 03/31/2021   Lab Results  Component Value Date   HDL 32.20 (L) 03/31/2021   Lab Results  Component Value Date   LDLCALC 65 09/22/2020   Lab Results  Component Value Date   TRIG 209.0 (H) 03/31/2021   Lab Results  Component Value Date  CHOLHDL 3 03/31/2021   Lab Results  Component Value Date   HGBA1C 5.6 04/30/2021   IMPRESSION AND PLAN:  No problem-specific Assessment & Plan notes found for this encounter.   Transaminasemia, mild.   Suspect d/t methotrexate. Follow hepatic panel today.  DM 2, followed by Dr. Cruzita Lederer.  An After Visit Summary was printed and given to the patient.  FOLLOW UP: No follow-ups on file. Cpe 3 mo  Signed:  Crissie Sickles, MD           07/15/2021

## 2021-07-27 ENCOUNTER — Other Ambulatory Visit: Payer: Self-pay | Admitting: Family Medicine

## 2021-07-29 NOTE — Telephone Encounter (Signed)
Requesting: adderall  Contract: 04/13/21 UDS: 03/21/20 Last Visit: 04/13/21 Next Visit: 07/30/21 Last Refill: 05/13/21(30,0)  Please Advise. Med pending

## 2021-07-30 ENCOUNTER — Ambulatory Visit: Payer: 59 | Admitting: Family Medicine

## 2021-07-30 NOTE — Telephone Encounter (Signed)
Pt advised refill sent. °

## 2021-08-04 ENCOUNTER — Other Ambulatory Visit: Payer: Self-pay

## 2021-08-04 ENCOUNTER — Ambulatory Visit (INDEPENDENT_AMBULATORY_CARE_PROVIDER_SITE_OTHER): Payer: 59 | Admitting: Family Medicine

## 2021-08-04 ENCOUNTER — Encounter: Payer: Self-pay | Admitting: Family Medicine

## 2021-08-04 VITALS — BP 145/85 | HR 81 | Temp 98.2°F | Ht 68.5 in | Wt 139.8 lb

## 2021-08-04 DIAGNOSIS — M05741 Rheumatoid arthritis with rheumatoid factor of right hand without organ or systems involvement: Secondary | ICD-10-CM | POA: Diagnosis not present

## 2021-08-04 DIAGNOSIS — Z8679 Personal history of other diseases of the circulatory system: Secondary | ICD-10-CM | POA: Diagnosis not present

## 2021-08-04 DIAGNOSIS — Z79899 Other long term (current) drug therapy: Secondary | ICD-10-CM

## 2021-08-04 DIAGNOSIS — I10 Essential (primary) hypertension: Secondary | ICD-10-CM | POA: Diagnosis not present

## 2021-08-04 DIAGNOSIS — D649 Anemia, unspecified: Secondary | ICD-10-CM | POA: Diagnosis not present

## 2021-08-04 DIAGNOSIS — F988 Other specified behavioral and emotional disorders with onset usually occurring in childhood and adolescence: Secondary | ICD-10-CM

## 2021-08-04 DIAGNOSIS — E7800 Pure hypercholesterolemia, unspecified: Secondary | ICD-10-CM

## 2021-08-04 MED ORDER — TRIAMCINOLONE ACETONIDE 40 MG/ML IJ SUSP: 40.0000 mg | Freq: Once | INTRAMUSCULAR | Status: DC

## 2021-08-04 MED ORDER — TRIAMCINOLONE ACETONIDE 40 MG/ML IJ SUSP
40.0000 mg | Freq: Once | INTRAMUSCULAR | Status: AC
  Administered 2021-08-04: 11:00:00 40 mg via INTRAMUSCULAR

## 2021-08-04 NOTE — Addendum Note (Signed)
Addended by: Emi Holes D on: 08/04/2021 11:24 AM   Modules accepted: Orders

## 2021-08-04 NOTE — Progress Notes (Signed)
OFFICE VISIT  08/04/2021  CC:  Chief Complaint  Patient presents with   Follow-up    RCI, pt is fasting    Patient is a 66 y.o. female who presents for 3 mo f/u HTN, HLD, adult ADD.  A/P as of last visit: "1) Hypotension: resolved --her fluid loss with diarrhea has stopped and she has been off her lisinopril.  Diastolic BPs now back up some into mild htn range but she'll hold off on restart of lisin until systolics consistently climb into upper 130s or so.   2) Shingles: suspected.  See hpi. She is immunosuppressed d/t meds. Start valtrex 1g tid x 7d.   3) DM: control historically has been good but recent preop (TKA surg) check of a1c was up over 8% so they rescheduled her surgery.  Subsequent a1c recheck at Dr. Charlean SanfilippoGherghe's 1 week later was 7.7%.  She'll f/u with Dr. Elvera LennoxGherghe as appropriate."  INTERIM HX: She got left total knee arthroplasty on 11/15/2022This has helped lately. No medicines for pain being used right now.  Home blood pressures 160s systolic not long after I saw her last so she restarted her lisinopril at half of the 20 mg tab dosing.  Blood pressures have improved but still average around 135-140 systolic.  No low blood pressures anymore.  Continues to take Lopressor 12.5 mg twice a day.  Pt states all is going well with the med at current dosing: much improved focus, concentration, task completion.  Less frustration, better multitasking, less impulsivity and restlessness.  Mood is stable. No side effects from the medication. Takes adderall xr 15mg  daily.  She has RA, followed by rheumatologist.  Patient reports last 6 to 8 weeks having significant swelling and redness of the M CP's on second and third digits of the right hand.  She has multiple joints hurting in both hands but these 2 joints seem to be the worst lately.  No fever or malaise.  PMP AWARE reviewed today: most recent rx for adderall xr 15mg  was filled 07/29/21, # 30, rx by me. Most recent diazepam rx filled  02/21/21, #60, rx by me. No red flags.   Past Medical History:  Diagnosis Date   Acute medial meniscus tear of right knee    Adrenal insufficiency (Addison's disease) ([facility]) 02/2021   Anemia of chronic disease    Mild, Hb stable consistently   Anxiety and depression    Chest pain 10/2017   +Cardiac CT.  Myocardial perfusion imaging NORMAL, EF>65%   Diabetes mellitus 1995   Managed by Dr. Elvera LennoxGherghe (Endo)   GERD (gastroesophageal reflux disease) per pt watches diet and take tums as needed   with hx of prox esoph stricture + dilation procedure   H/O hiatal hernia    slightly larger ("moderate" size) on CT angio done to r/o PE 10/2017. noted on egd again 03/2021   History of adrenal insufficiency    in 2022 when she tried coming off her chronic daily prednisone for her RA   Hyperlipidemia    Hypertension    Interstitial cystitis    Lichen sclerosus of female genitalia    Migraine    Osteoarthritis, multiple sites    Palpitations 10/2017   3 wk event monitor: symptoms correlate with sinus rhythm with PACs, at times runs of PACs up to 5 beats.   Pseudogout of knee, left    colchicine helpful   Psoriasis    Seasonal allergies    Seropositive rheumatoid arthritis ([facility])    Hands,  knees, jaw, elbow: responding to Simponi as of 09/29/15 rheum f/u.  Waning effectiveness on 03/2016 f/u so pt switched to Orencia injections.  Doing well on chronic low-dose prednisone therapy+ methotrexate +orencia as of 01/2019, 08/2019, 11/2019    Past Surgical History:  Procedure Laterality Date   ABDOMINAL HYSTERECTOMY  1991   for fibroids; ovaries still in   BIOPSY  03/17/2021   Procedure: BIOPSY;  Surgeon: Kathi Der, MD;  Location: [facility];  Service: Gastroenterology;;   BREAST BIOPSY  2001; 02/12/16   fibrocystic breast disease in 2001 and again in 02/2016 (+scar from prev bx site w/calcifications).   CARDIOVASCULAR STRESS TEST  11/01/2017   Myocard perf imaging: NORMAL (EF >60-65%)    COLONOSCOPY  09/12/2013; 02/25/20   Normal 2015 and 2021: Recall 5 yrs ([facility], Dr. Evette Cristal) due to FH of colon polyps.   CYSTOSCOPY  2004   for chronic UTI   DEXA  10/2016   Bone density normal (T-score 0.0)   ESOPHAGOGASTRODUODENOSCOPY  04/19/2011   Nl except antral gastritis: bx = mild chron gastritis (H pyloria neg)    ESOPHAGOGASTRODUODENOSCOPY (EGD) WITH PROPOFOL N/A 03/27/2020   Esoph stenosis-->dilated.  Mod size hiatal hernia ([facility]). Procedure: ESOPHAGOGASTRODUODENOSCOPY (EGD) WITH PROPOFOL ;  Surgeon: Graylin Shiver, MD;  Location: [facility];  Service: Endoscopy;  Laterality: N/A;   ESOPHAGOGASTRODUODENOSCOPY (EGD) WITH PROPOFOL N/A 03/17/2021   Esoph stricture->dilated.  Large hiatal hernia. Also +esoph candidiasis, bx inflammation.  Procedure: ESOPHAGOGASTRODUODENOSCOPY (EGD) WITH PROPOFOL;  Surgeon: Kathi Der, MD;  Location: [facility];  Service: Gastroenterology;  Laterality: N/A;   EVENT MONITOR  10/2017   3 wk-->Symptoms correlate with sinus rhythm with PACs, at times runs of PACs up to 5 beats.   GALLBLADDER SURGERY  2019   intraarticular steroid injection  2013   3 R knee & L X 1; Dr Netta Corrigan   KNEE ARTHROCENTESIS  2013   R knee x 3 & L X 1   KNEE ARTHROSCOPY  10/21/2011   Procedure: ARTHROSCOPY KNEE;  Surgeon: Jacki Cones, MD;  Location: [facility];  Service: Orthopedics;  Laterality: Right;  WITH MEDIAL and lateral shaving of femoral chondyl  with microfracture technique of lateral and medial femoral chondyl suprapatellar synovectomy   LAPAROSCOPIC CHOLECYSTECTOMY  01/2018   SAVORY DILATION N/A 03/27/2020   Procedure: SAVORY DILATION;  Surgeon: Graylin Shiver, MD;  Location: [facility];  Service: Endoscopy;  Laterality: N/A;   TOTAL KNEE ARTHROPLASTY  04/12/2012   RIGHT  Procedure: TOTAL KNEE ARTHROPLASTY;  Surgeon: Jacki Cones, MD;  Location: [facility];  Service: Orthopedics;  Laterality: Right;  Right Total Knee Arthroplasty    TOTAL KNEE ARTHROPLASTY Left 05/26/2021   Procedure: TOTAL KNEE ARTHROPLASTY;  Surgeon: Durene Romans, MD;  Location: [facility];  Service: Orthopedics;  Laterality: Left;   TUBAL LIGATION  1981    Outpatient Medications Prior to Visit  Medication Sig Dispense Refill   acetaminophen (TYLENOL) 500 MG tablet Take 1,000 mg by mouth every 6 (six) hours as needed for moderate pain.     amphetamine-dextroamphetamine (ADDERALL XR) 15 MG 24 hr capsule TAKE ONE CAPSULE BY MOUTH EVERY MORNING 30 capsule 0   cetirizine (ZYRTEC) 10 MG tablet Take 10 mg by mouth daily.     Colchicine 0.6 MG CAPS Take 1.2 mg by mouth at bedtime.     diazepam (VALIUM) 5 MG tablet TAKE ONE TABLET BY MOUTH EVERY TWELVE HOURS AS NEEDED FOR MIGRAINE HEADACHES 60 tablet  5   docusate sodium (COLACE) 100 MG capsule Take 1 capsule (100 mg total) by mouth 2 (two) times daily. 10 capsule 0   folic acid (FOLVITE) 1 MG tablet Take 1 mg by mouth at bedtime.     leflunomide (ARAVA) 10 MG tablet Take 5 mg by mouth daily.     lisinopril (ZESTRIL) 20 MG tablet Take 0.5 tablets (10 mg total) by mouth daily. 45 tablet 3   metFORMIN (GLUCOPHAGE-XR) 500 MG 24 hr tablet Take 2 tablets (1,000 mg total) by mouth 2 (two) times daily with a meal. 360 tablet 3   methocarbamol (ROBAXIN) 500 MG tablet Take 1 tablet (500 mg total) by mouth every 6 (six) hours as needed for muscle spasms. 40 tablet 0   methotrexate 50 MG/2ML injection Inject 22.5 mg into the muscle every Monday.     metoprolol tartrate (LOPRESSOR) 25 MG tablet Take 0.5 tablets (12.5 mg total) by mouth 2 (two) times daily. 90 tablet 3   phenazopyridine (PYRIDIUM) 95 MG tablet Take 190 mg by mouth 2 (two) times daily as needed for pain.     polyethylene glycol (MIRALAX / GLYCOLAX) 17 g packet Take 17 g by mouth daily as needed for mild constipation. 14 each 0   Polyvinyl Alcohol-Povidone (REFRESH OP) Place 1 drop into both eyes 2 (two) times daily.     predniSONE (DELTASONE) 5 MG tablet Take  1-2 tablets (5-10 mg total) by mouth daily. (Patient taking differently: Take 5 mg by mouth daily with breakfast.) 180 tablet 3   primidone (MYSOLINE) 50 MG tablet Take 50-100 mg by mouth See admin instructions. Take 50 mg by mouth in the morning and 100 mg by mouth in the evening.     rosuvastatin (CRESTOR) 20 MG tablet Take 1 tablet (20 mg total) by mouth daily. (Patient taking differently: Take 20 mg by mouth at bedtime.) 90 tablet 3   sertraline (ZOLOFT) 100 MG tablet TAKE ONE TABLET BY MOUTH EVERY DAY 90 tablet 3   omeprazole (PRILOSEC) 40 MG capsule Take 1 capsule (40 mg total) by mouth 2 (two) times daily. 60 capsule 3   promethazine (PHENERGAN) 12.5 MG tablet 1-2 tabs po q6h prn nausea (Patient not taking: Reported on 08/04/2021) 30 tablet 2   celecoxib (CELEBREX) 200 MG capsule Take 1 capsule (200 mg total) by mouth 2 (two) times daily. (Patient not taking: Reported on 08/04/2021) 60 capsule 0   valACYclovir (VALTREX) 1000 MG tablet 1 tab po tid x 7d (Patient not taking: Reported on 05/12/2021) 21 tablet 0   No facility-administered medications prior to visit.    Allergies  Allergen Reactions   Steri-Strip Compound Benzoin [Benzoin Compound] Anaphylaxis and Itching    Blisters and itching   Penicillins Rash    Tolerated Cephalosporin 05/26/21.     Pravastatin     Myalgias   Simvastatin     Myalgias   Hydrocodone-Acetaminophen Nausea And Vomiting   Hydroxychloroquine Sulfate Rash    ROS As per HPI  PE: Vitals with BMI 08/04/2021 05/27/2021 05/27/2021  Height 5' 8.5" - -  Weight 139 lbs 13 oz - -  BMI 20.94 - -  Systolic 145 127 161  Diastolic 85 70 65  Pulse 81 87 85     Physical Exam General: Alert and well-appearing.  Affect pleasant, speech and thought are lucid. Cardiovascular: Regular rhythm and rate without murmur rub or gallop. Lungs clear to auscultation bilaterally, nonlabored respirations. Extremities no clubbing cyanosis or edema. Right hand: Significant  swelling and mild erythema over dorsal aspect of MCP joints on the second and third digits.  She has mildly limited grip strength bilaterally.  Extension is full.  The affected joints are mildly tender to palpation.  LABS:  Last CBC Lab Results  Component Value Date   WBC 7.1 05/27/2021   HGB 8.0 (L) 05/27/2021   HCT 25.1 (L) 05/27/2021   MCV 86.3 05/27/2021   MCH 27.5 05/27/2021   RDW 13.3 05/27/2021   PLT 164 05/27/2021   Last metabolic panel Lab Results  Component Value Date   GLUCOSE 207 (H) 05/27/2021   NA 135 05/27/2021   K 3.7 05/27/2021   CL 107 05/27/2021   CO2 21 (L) 05/27/2021   BUN 15 05/27/2021   CREATININE 0.63 05/27/2021   GFRNONAA >60 05/27/2021   CALCIUM 7.5 (L) 05/27/2021   PROT 6.9 05/18/2021   ALBUMIN 3.5 05/18/2021   BILITOT 0.4 05/18/2021   ALKPHOS 53 05/18/2021   AST 31 05/18/2021   ALT 27 05/18/2021   ANIONGAP 7 05/27/2021   Last lipids Lab Results  Component Value Date   CHOL 108 03/31/2021   HDL 32.20 (L) 03/31/2021   LDLCALC 65 09/22/2020   LDLDIRECT 47.0 03/31/2021   TRIG 209.0 (H) 03/31/2021   CHOLHDL 3 03/31/2021   Last hemoglobin A1c Lab Results  Component Value Date   HGBA1C 5.6 04/30/2021   Last thyroid functions Lab Results  Component Value Date   TSH 1.37 11/09/2019   IMPRESSION AND PLAN:  #1 hypertension.  Uncontrolled. Better since restarting lisinopril 10 mg a day.  Will increase to 20 mg a day and continue Lopressor 12.5 twice daily.  2.  Adult ADD.  Stable.  Continue Adderall XR 15 mg a day. No new prescription needed today.  #3 normocytic anemia.  Mild, chronic over the last couple years.  However in the postoperative period after left knee replacement in November a recheck of her hemoglobin was 8.5. Repeat CBC and check iron panel--Future  #4 inflammatory arthritis.  Acute flare in MCP joints of second and third digit on right hand. Discussed steroid injection to each joint today and are discussing pros and  cons patient elected to proceed with injections.  Procedure: Therapeutic MCP jt injection--R hand 2nd and 3rd digits.  The patient's clinical condition is marked by substantial pain and/or significant functional disability.  Other conservative therapy has not provided relief, is contraindicated, or not appropriate.  There is a reasonable likelihood that injection will significantly improve the patient's pain and/or functional disability. Consent obtained. Cleaned skin with Betadine and alcohol swab, used ultrasound for needle guidance Injected  kenalog + 1/2 ml of 1% plain lidocaine into joint space without resistance.  No immediate complications.  Patient tolerated procedure well.  Post-injection care discussed, including 20 min of icing 1-2 times in the next 4-8 hours, frequent non weight-bearing ROM exercises over the next few days, and general pain medication management.  5) Hypercholesterolemia.  Rosuvastatin 20 mg a day. Direct LDL was 47 about 4 months ago. Lipid and hepatic panel in 3 to 6 months.  An After Visit Summary was printed and given to the patient.  FOLLOW UP: Return in about 3 months (around 11/02/2021) for routine chronic illness f/u.  Signed:  Santiago Bumpers, MD           08/04/2021

## 2021-08-05 ENCOUNTER — Ambulatory Visit (INDEPENDENT_AMBULATORY_CARE_PROVIDER_SITE_OTHER): Payer: 59

## 2021-08-05 DIAGNOSIS — I10 Essential (primary) hypertension: Secondary | ICD-10-CM

## 2021-08-05 DIAGNOSIS — D649 Anemia, unspecified: Secondary | ICD-10-CM | POA: Diagnosis not present

## 2021-08-05 DIAGNOSIS — Z79899 Other long term (current) drug therapy: Secondary | ICD-10-CM | POA: Diagnosis not present

## 2021-08-05 DIAGNOSIS — E7800 Pure hypercholesterolemia, unspecified: Secondary | ICD-10-CM | POA: Diagnosis not present

## 2021-08-05 LAB — COMPREHENSIVE METABOLIC PANEL
ALT: 25 U/L (ref 0–35)
AST: 22 U/L (ref 0–37)
Albumin: 4.2 g/dL (ref 3.5–5.2)
Alkaline Phosphatase: 54 U/L (ref 39–117)
BUN: 15 mg/dL (ref 6–23)
CO2: 27 mEq/L (ref 19–32)
Calcium: 8.9 mg/dL (ref 8.4–10.5)
Chloride: 103 mEq/L (ref 96–112)
Creatinine, Ser: 0.67 mg/dL (ref 0.40–1.20)
GFR: 91.28 mL/min (ref >60.00)
Glucose, Bld: 120 mg/dL — ABNORMAL HIGH (ref 70–99)
Potassium: 3.9 mEq/L (ref 3.5–5.1)
Sodium: 139 mEq/L (ref 135–145)
Total Bilirubin: 0.3 mg/dL (ref 0.2–1.2)
Total Protein: 6.6 g/dL (ref 6.0–8.3)

## 2021-08-05 LAB — CBC
HCT: 31.2 % — ABNORMAL LOW (ref 36.0–46.0)
Hemoglobin: 10 g/dL — ABNORMAL LOW (ref 12.0–15.0)
MCHC: 31.9 g/dL (ref 30.0–36.0)
MCV: 78.5 fl (ref 78.0–100.0)
Platelets: 226 10*3/uL (ref 150.0–400.0)
RBC: 3.97 Mil/uL (ref 3.87–5.11)
RDW: 15.1 % (ref 11.5–15.5)
WBC: 7.1 10*3/uL (ref 4.0–10.5)

## 2021-08-05 LAB — FOLATE: Folate: 9.2 ng/mL (ref >5.9)

## 2021-08-05 LAB — VITAMIN B12: Vitamin B-12: 267 pg/mL (ref 211–911)

## 2021-08-06 LAB — IRON,TIBC AND FERRITIN PANEL
%SAT: 10 % (calc) — ABNORMAL LOW (ref 16–45)
Ferritin: 28 ng/mL (ref 16–288)
Iron: 39 ug/dL — ABNORMAL LOW (ref 45–160)
TIBC: 384 mcg/dL (calc) (ref 250–450)

## 2021-09-01 ENCOUNTER — Ambulatory Visit: Payer: 59 | Admitting: Internal Medicine

## 2021-09-08 ENCOUNTER — Other Ambulatory Visit: Payer: Self-pay | Admitting: Family Medicine

## 2021-09-08 NOTE — Telephone Encounter (Signed)
Requesting: adderall Contract: 04/13/21 UDS: 03/21/20 Last Visit: 08/04/21 Next Visit: 11/02/21 Last Refill: 07/29/21(30,0)  Please Advise. Med pending

## 2021-09-08 NOTE — Telephone Encounter (Signed)
Pt advised refill sent. °

## 2021-11-02 ENCOUNTER — Encounter: Payer: Self-pay | Admitting: Family Medicine

## 2021-11-02 ENCOUNTER — Ambulatory Visit (INDEPENDENT_AMBULATORY_CARE_PROVIDER_SITE_OTHER): Payer: 59 | Admitting: Family Medicine

## 2021-11-02 VITALS — BP 135/84 | HR 80 | Temp 98.2°F | Ht 68.5 in | Wt 142.4 lb

## 2021-11-02 DIAGNOSIS — Z79899 Other long term (current) drug therapy: Secondary | ICD-10-CM

## 2021-11-02 DIAGNOSIS — R079 Chest pain, unspecified: Secondary | ICD-10-CM | POA: Diagnosis not present

## 2021-11-02 DIAGNOSIS — D649 Anemia, unspecified: Secondary | ICD-10-CM | POA: Diagnosis not present

## 2021-11-02 DIAGNOSIS — F988 Other specified behavioral and emotional disorders with onset usually occurring in childhood and adolescence: Secondary | ICD-10-CM | POA: Diagnosis not present

## 2021-11-02 DIAGNOSIS — I10 Essential (primary) hypertension: Secondary | ICD-10-CM

## 2021-11-02 LAB — CBC
HCT: 33.6 % — ABNORMAL LOW (ref 36.0–46.0)
Hemoglobin: 11 g/dL — ABNORMAL LOW (ref 12.0–15.0)
MCHC: 32.7 g/dL (ref 30.0–36.0)
MCV: 78 fl (ref 78.0–100.0)
Platelets: 265 10*3/uL (ref 150.0–400.0)
RBC: 4.31 Mil/uL (ref 3.87–5.11)
RDW: 15.7 % — ABNORMAL HIGH (ref 11.5–15.5)
WBC: 6.2 10*3/uL (ref 4.0–10.5)

## 2021-11-02 LAB — COMPREHENSIVE METABOLIC PANEL
ALT: 30 U/L (ref 0–35)
AST: 30 U/L (ref 0–37)
Albumin: 3.8 g/dL (ref 3.5–5.2)
Alkaline Phosphatase: 86 U/L (ref 39–117)
BUN: 11 mg/dL (ref 6–23)
CO2: 24 mEq/L (ref 19–32)
Calcium: 8.7 mg/dL (ref 8.4–10.5)
Chloride: 104 mEq/L (ref 96–112)
Creatinine, Ser: 0.68 mg/dL (ref 0.40–1.20)
GFR: 90.8 mL/min (ref >60.00)
Glucose, Bld: 211 mg/dL — ABNORMAL HIGH (ref 70–99)
Potassium: 4.4 mEq/L (ref 3.5–5.1)
Sodium: 137 mEq/L (ref 135–145)
Total Bilirubin: 0.3 mg/dL (ref 0.2–1.2)
Total Protein: 6.5 g/dL (ref 6.0–8.3)

## 2021-11-02 MED ORDER — LISINOPRIL 20 MG PO TABS: 20.0000 mg | ORAL_TABLET | Freq: Every day | ORAL | 3 refills | Status: DC

## 2021-11-02 MED ORDER — AMPHETAMINE-DEXTROAMPHET ER 15 MG PO CP24: 15.0000 mg | ORAL_CAPSULE | Freq: Every morning | ORAL | 0 refills | Status: DC

## 2021-11-02 NOTE — Progress Notes (Signed)
OFFICE VISIT ? ?11/02/2021 ? ?CC: [facility] f/u ? ?HPI:   ? ?Patient is a 66 y.o. female who presents for  3 mo f/u HTN, HLD, adult ADD.  ?A/P as of last visit: ?"#1 hypertension.  Uncontrolled. ?Better since restarting lisinopril 10 mg a day.  Will increase to 20 mg a day and continue Lopressor 12.5 twice daily. ? ?2.  Adult ADD.  Stable.  Continue Adderall XR 15 mg a day. ?No new prescription needed today. ?  ?#3 normocytic anemia.  Mild, chronic over the last couple years.  However in the postoperative period after left knee replacement in November a recheck of her hemoglobin was 8.5. ?Repeat CBC and check iron panel--Future ?  ?#4 inflammatory arthritis.  Acute flare in MCP joints of second and third digit on right hand. ?Discussed steroid injection to each joint today and are discussing pros and cons patient elected to proceed with injections. ?  ?Procedure: Therapeutic MCP jt injection--R hand 2nd and 3rd digits.  The patient's clinical condition is marked by substantial pain and/or significant functional disability.  Other conservative therapy has not provided relief, is contraindicated, or not appropriate.  There is a reasonable likelihood that injection will significantly improve the patient's pain and/or functional disability. ?Consent obtained. ?Cleaned skin with Betadine and alcohol swab, used ultrasound for needle guidance Injected 20mg  kenalog + 1/2 ml of 1% plain lidocaine into joint space without resistance.  No immediate complications.  Patient tolerated procedure well.  Post-injection care discussed, including 20 min of icing 1-2 times in the next 4-8 hours, frequent non weight-bearing ROM exercises over the next few days, and general pain medication management. ?  ?5) Hypercholesterolemia.  Rosuvastatin 20 mg a day. ?Direct LDL was 47 about 4 months ago. ?Lipid and hepatic panel in 3 to 6 months." ? ?INTERIM HX: ?Raven [name] says on February 27 she had some dizziness and then a syncopal episode while sitting  down at church. ?She did not remember much from that day but says she was vomiting a lot after the event and had some diarrhea as well, and then says she noted that she had some chest pain as well as pain up into her left shoulder and jaw and down her arm.  Denies having palpitations or shortness of breath or lower extremity pain or swelling. ?She noted gradual dissipation of all symptoms over the next 24 to 36 hours.  It did not occur to her that she may have been having a cardiac issue until days later.  Since that time she has had some intermittent feeling of chest tightness and shortness of breath that seem to be random.  No headaches.  No recurrence of dizziness. 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 ?She has made an appointment with her cardiologist Dr. Harl Bowie for 12/03/21. ? ?Home blood pressures have been around 130/80 or better. ? ?PMP AWARE reviewed today: most recent rx for adderall was filled 09/08/21, # 38, rx by me. ?No red flags. ? ?ROS as above, plus--> no fevers, no wheezing, no cough,  no HAs, no rashes, no melena/hematochezia.  No polyuria or polydipsia.    No focal weakness, paresthesias, or tremors.  No acute vision or hearing abnormalities.  No dysuria or unusual/new urinary urgency or frequency.  No recent changes in lower legs. ? ? ?Past Medical  History:  ?Diagnosis Date  ? Acute medial meniscus tear of right knee   ? Adrenal insufficiency (Addison's disease) ([place]) 02/2021  ? Anemia of chronic disease   ? Mild, Hb stable consistently  ? Anxiety and depression   ? Chest pain 10/2017  ? +Cardiac CT.  Myocardial perfusion imaging NORMAL, EF>65%  ? Diabetes mellitus 1995  ? Managed by Dr. Cruzita Lederer (Endo)  ? GERD (gastroesophageal reflux disease) per pt watches diet and take tums as needed  ? with hx of prox esoph stricture + dilation  procedure  ? H/O hiatal hernia   ? slightly larger ("moderate" size) on CT angio done to r/o PE 10/2017. noted on egd again 03/2021  ? History of adrenal insufficiency   ? in 2022 when she tried coming off her chronic daily prednisone for her RA  ? Hyperlipidemia   ? Hypertension   ? Interstitial cystitis   ? Lichen sclerosus of female genitalia   ? Migraine   ? Osteoarthritis, multiple sites   ? Palpitations 10/2017  ? 3 wk event monitor: symptoms correlate with sinus rhythm with PACs, at times runs of PACs up to 5 beats.  ? Pseudogout of knee, left   ? colchicine helpful  ? Psoriasis   ? Seasonal allergies   ? Seropositive rheumatoid arthritis ([place])   ? Hands, knees, jaw, elbow: responding to Simponi as of 09/29/15 rheum f/u.  Waning effectiveness on 03/2016 f/u so pt switched to Orencia injections.  Doing well on chronic low-dose prednisone therapy+ methotrexate +orencia as of 01/2019, 08/2019, 11/2019  ? ? ?Past Surgical History:  ?Procedure Laterality Date  ? ABDOMINAL HYSTERECTOMY  1991  ? for fibroids; ovaries still in  ? BIOPSY  03/17/2021  ? Procedure: BIOPSY;  Surgeon: Otis Brace, MD;  Location: [facility];  Service: Gastroenterology;;  ? BREAST BIOPSY  2001; 02/12/16  ? fibrocystic breast disease in 2001 and again in 02/2016 (+scar from prev bx site w/calcifications).  ? CARDIOVASCULAR STRESS TEST  11/01/2017  ? Myocard perf imaging: NORMAL (EF >60-65%)  ? COLONOSCOPY  09/12/2013; 02/25/20  ? Normal 2015 and 2021: Recall 5 yrs ([facility], Dr. Penelope Coop) due to [place] of colon polyps.  ? CYSTOSCOPY  2004  ? for chronic UTI  ? DEXA  10/2016  ? Bone density normal (T-score 0.0)  ? ESOPHAGOGASTRODUODENOSCOPY  04/19/2011  ? Nl except antral gastritis: bx = mild chron gastritis (H pyloria neg)   ? ESOPHAGOGASTRODUODENOSCOPY (EGD) WITH PROPOFOL N/A 03/27/2020  ? Esoph stenosis-->dilated.  Mod size hiatal hernia (Dig Hea Spec). Procedure: ESOPHAGOGASTRODUODENOSCOPY (EGD) WITH PROPOFOL ;  Surgeon: Wonda Horner, MD;   Location: [facility];  Service: Endoscopy;  Laterality: N/A;  ? ESOPHAGOGASTRODUODENOSCOPY (EGD) WITH PROPOFOL N/A 03/17/2021  ? Esoph stricture->dilated.  Large hiatal hernia. Also +esoph candidiasis, bx inflammation.  Procedure: ESOPHAGOGASTRODUODENOSCOPY (EGD) WITH PROPOFOL;  Surgeon: Otis Brace, MD;  [facility]: [facility];  Service: Gastroenterology;  Laterality: N/A;  ? EVENT MONITOR  10/2017  ? 3 wk-->Symptoms correlate with sinus rhythm with PACs, at times runs of PACs up to 5 beats.  ? GALLBLADDER SURGERY  2019  ? intraarticular steroid injection  2013  ? 3 R knee & L X 1; Dr Rushie Nyhan  ? KNEE ARTHROCENTESIS  2013  ? R knee x 3 & L X 1  ? KNEE ARTHROSCOPY  10/21/2011  ? Procedure: ARTHROSCOPY KNEE;  Surgeon: Tobi Bastos, MD;  Location: [facility];  Service: Orthopedics;  Laterality: Right;  WITH MEDIAL  and lateral shaving of femoral chondyl  with microfracture technique of lateral and medial femoral chondyl suprapatellar synovectomy  ? LAPAROSCOPIC CHOLECYSTECTOMY  01/2018  ? SAVORY DILATION N/A 03/27/2020  ? Procedure: SAVORY DILATION;  Surgeon: Wonda Horner, MD;  Location: [facility];  Service: Endoscopy;  Laterality: N/A;  ? TOTAL KNEE ARTHROPLASTY  04/12/2012  ? RIGHT  Procedure: TOTAL KNEE ARTHROPLASTY;  Surgeon: Tobi Bastos, MD;  Location: [facility];  Service: Orthopedics;  Laterality: Right;  Right Total Knee Arthroplasty  ? TOTAL KNEE ARTHROPLASTY Left 05/26/2021  ? Procedure: TOTAL KNEE ARTHROPLASTY;  Surgeon: Paralee Cancel, MD;  Location: [facility];  Service: Orthopedics;  Laterality: Left;  ? TUBAL LIGATION  1981  ? ? ?Outpatient Medications Prior to Visit  ?Medication Sig Dispense Refill  ? aspirin EC 81 MG tablet Take 81 mg by mouth daily. Swallow whole.    ? cetirizine (ZYRTEC) 10 MG tablet Take 10 mg by mouth daily.    ? Colchicine 0.6 MG CAPS Take 0.6 mg by mouth 2 (two) times daily.    ? diazepam (VALIUM) 5 MG tablet TAKE ONE TABLET BY MOUTH EVERY TWELVE  HOURS AS NEEDED FOR MIGRAINE HEADACHES 60 tablet 5  ? folic acid (FOLVITE) 1 MG tablet Take 1 mg by mouth at bedtime.    ? leflunomide (ARAVA) 10 MG tablet Take 5 mg by mouth daily.    ? lisinopril (ZESTRIL) 20 MG t

## 2021-11-05 LAB — DRUG MONITORING PANEL 376104, URINE
Alphahydroxyalprazolam: NEGATIVE ng/mL (ref <25)
Alphahydroxymidazolam: NEGATIVE ng/mL (ref <50)
Alphahydroxytriazolam: NEGATIVE ng/mL (ref <50)
Aminoclonazepam: NEGATIVE ng/mL (ref <25)
Amphetamines: NEGATIVE ng/mL (ref <500)
Barbiturates: NEGATIVE ng/mL (ref <300)
Benzodiazepines: POSITIVE ng/mL — AB (ref <100)
Cocaine Metabolite: NEGATIVE ng/mL (ref <150)
Desmethyltramadol: NEGATIVE ng/mL (ref <100)
Hydroxyethylflurazepam: NEGATIVE ng/mL (ref <50)
Lorazepam: NEGATIVE ng/mL (ref <50)
Nordiazepam: 52 ng/mL — ABNORMAL HIGH (ref <50)
Opiates: NEGATIVE ng/mL (ref <100)
Oxazepam: 317 ng/mL — ABNORMAL HIGH (ref <50)
Oxycodone: NEGATIVE ng/mL (ref <100)
Temazepam: 137 ng/mL — ABNORMAL HIGH (ref <50)
Tramadol: NEGATIVE ng/mL (ref <100)

## 2021-11-05 LAB — IRON,TIBC AND FERRITIN PANEL
%SAT: 8 % (calc) — ABNORMAL LOW (ref 16–45)
Ferritin: 53 ng/mL (ref 16–288)
Iron: 30 ug/dL — ABNORMAL LOW (ref 45–160)
TIBC: 375 mcg/dL (calc) (ref 250–450)

## 2021-11-05 LAB — DM TEMPLATE

## 2021-11-13 ENCOUNTER — Ambulatory Visit (INDEPENDENT_AMBULATORY_CARE_PROVIDER_SITE_OTHER): Payer: 59

## 2021-11-13 DIAGNOSIS — R0602 Shortness of breath: Secondary | ICD-10-CM

## 2021-11-13 DIAGNOSIS — R079 Chest pain, unspecified: Secondary | ICD-10-CM

## 2021-11-13 DIAGNOSIS — R072 Precordial pain: Secondary | ICD-10-CM

## 2021-11-13 HISTORY — PX: TRANSTHORACIC ECHOCARDIOGRAM: SHX275

## 2021-11-13 LAB — ECHOCARDIOGRAM COMPLETE
AR max vel: 2.01 cm2
AV Area VTI: 1.88 cm2
AV Area mean vel: 1.9 cm2
AV Mean grad: 3 mmHg
AV Peak grad: 5.5 mmHg
Ao pk vel: 1.17 m/s
Area-P 1/2: 4.01 cm2
Calc EF: 69.9 %
S' Lateral: 2.62 cm
Single Plane A2C EF: 68.7 %
Single Plane A4C EF: 68.2 %

## 2021-11-28 ENCOUNTER — Ambulatory Visit (HOSPITAL_BASED_OUTPATIENT_CLINIC_OR_DEPARTMENT_OTHER): Payer: 59

## 2021-12-03 ENCOUNTER — Ambulatory Visit (INDEPENDENT_AMBULATORY_CARE_PROVIDER_SITE_OTHER): Payer: 59 | Admitting: Cardiology

## 2021-12-03 ENCOUNTER — Encounter: Payer: Self-pay | Admitting: Cardiology

## 2021-12-03 ENCOUNTER — Ambulatory Visit (HOSPITAL_COMMUNITY)
Admission: RE | Admit: 2021-12-03 | Discharge: 2021-12-03 | Disposition: A | Discharge status: Home / Self Care | Payer: 59 | Source: Ambulatory Visit | Attending: Cardiology | Admitting: Cardiology

## 2021-12-03 VITALS — BP 124/80 | HR 92 | Ht 68.5 in | Wt 142.6 lb

## 2021-12-03 DIAGNOSIS — R55 Syncope and collapse: Secondary | ICD-10-CM

## 2021-12-03 DIAGNOSIS — R0602 Shortness of breath: Secondary | ICD-10-CM | POA: Diagnosis present

## 2021-12-03 DIAGNOSIS — R0789 Other chest pain: Secondary | ICD-10-CM | POA: Diagnosis not present

## 2021-12-03 MED ORDER — IOHEXOL 350 MG/ML SOLN
75.0000 mL | Freq: Once | INTRAVENOUS | Status: AC | PRN
  Administered 2021-12-03: 75 mL via INTRAVENOUS

## 2021-12-03 NOTE — Progress Notes (Signed)
Clinical Summary Ms. Trotter is a 66 y.o.female seen today for follow up of the following medical problems.     1. Syncope  - prior episode of syncope at time of initial consultation in 2019  - event monitor showed runs of SVT up to 200bpm       - end of Feb was in church. Started getting dizzy while sitting. 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 Ongoing dizziness throughout the sermon. Sat down on stool, transient of consciousness. Came to, brought home and sat in her chair. Started having diarrhea, vomiting, diaphoretic with cold sweats. Left arm pain into left neck and chest tightness, some SOB. Reports history of migraines. The next day dizziness had resolved, still with some neck and shoulder pains, chest tightness that lasted several hours - no recurrent syncope - ongoing chest tightness, SOB. Left sided, 4/10 in severity. Can occur at rest or with activity. +SOB. Lasts <1 hr. Worst with deep breath.  - has been limiting activities due SOB/DOE - no prior history of blood.    11/2021 echo: LVEF 65-70%, no WMAs, grade I DD, mild RV dysfunction,   - cannot run on treadmill due to knee replacement - pcp ordered CT PE has not been done yet           SH: works as Investment banker, [employer] Past Medical History:  Diagnosis Date   Acute medial meniscus tear of right knee    Adrenal insufficiency (Addison's disease) ([place]) 02/2021   Anemia of chronic disease    Mild, Hb stable consistently   Anxiety and depression    Chest pain 10/2017   +Cardiac CT.  Myocardial perfusion imaging NORMAL, EF>65%   Diabetes mellitus 1995   Managed by Dr. Cruzita Lederer (Endo)   GERD (gastroesophageal reflux disease) per pt watches diet and take tums as needed   with hx of prox esoph stricture + dilation procedure   H/O hiatal hernia    slightly larger ("moderate" size) on CT angio done to r/o PE 10/2017. noted on egd again 03/2021   History of adrenal insufficiency    in 2022 when she tried coming off her chronic daily  prednisone for her RA   Hyperlipidemia    Hypertension    Interstitial cystitis    Lichen sclerosus of female genitalia    Migraine    Osteoarthritis, multiple sites    Palpitations 10/2017   3 wk event monitor: symptoms correlate with sinus rhythm with PACs, at times runs of PACs up to 5 beats.   Pseudogout of knee, left    colchicine helpful   Psoriasis    Seasonal allergies    Seropositive rheumatoid arthritis ([place])    Hands, knees, jaw, elbow: responding to Simponi as of 09/29/15 rheum f/u.  Waning effectiveness on 03/2016 f/u so pt switched to Orencia injections.  Doing well on chronic low-dose prednisone therapy+ methotrexate +orencia as of 01/2019, 08/2019, 11/2019     Allergies  Allergen Reactions   Steri-Strip Compound Benzoin [Benzoin Compound] Anaphylaxis and Itching    Blisters and itching   Penicillins Rash    Tolerated Cephalosporin 05/26/21.     Pravastatin     Myalgias   Simvastatin     Myalgias   Hydrocodone-Acetaminophen Nausea And Vomiting   Hydroxychloroquine Sulfate Rash     Current Outpatient Medications  Medication Sig Dispense Refill   acetaminophen (TYLENOL) 500 MG tablet Take 1,000 mg by mouth every 6 (six) hours as needed for moderate pain. (Patient not taking:  Reported on 11/02/2021)     amphetamine-dextroamphetamine (ADDERALL XR) 15 MG 24 hr capsule Take 1 capsule by mouth every morning. TAKE ONE CAPSULE BY MOUTH EVERY MORNING. 30 capsule 0   aspirin EC 81 MG tablet Take 81 mg by mouth daily. Swallow whole.     cetirizine (ZYRTEC) 10 MG tablet Take 10 mg by mouth daily.     Colchicine 0.6 MG CAPS Take 0.6 mg by mouth 2 (two) times daily.     diazepam (VALIUM) 5 MG tablet TAKE ONE TABLET BY MOUTH EVERY TWELVE HOURS AS NEEDED FOR MIGRAINE HEADACHES 60 tablet 5   docusate sodium (COLACE) 100 MG capsule Take 1 capsule (100 mg total) by mouth 2 (two) times daily. (Patient not taking: Reported on 11/02/2021) 10 capsule 0   folic acid (FOLVITE) 1 MG tablet  Take 1 mg by mouth at bedtime.     leflunomide (ARAVA) 10 MG tablet Take 5 mg by mouth daily.     lisinopril (ZESTRIL) 20 MG tablet Take 1 tablet (20 mg total) by mouth daily. 90 tablet 3   metFORMIN (GLUCOPHAGE-XR) 500 MG 24 hr tablet Take 2 tablets (1,000 mg total) by mouth 2 (two) times daily with a meal. 360 tablet 3   methotrexate 50 MG/2ML injection Inject 22.5 mg into the muscle every Monday.     metoprolol tartrate (LOPRESSOR) 25 MG tablet Take 0.5 tablets (12.5 mg total) by mouth 2 (two) times daily. 90 tablet 3   omeprazole (PRILOSEC) 40 MG capsule Take 1 capsule (40 mg total) by mouth 2 (two) times daily. 60 capsule 3   phenazopyridine (PYRIDIUM) 95 MG tablet Take 190 mg by mouth 2 (two) times daily as needed for pain.     polyethylene glycol (MIRALAX / GLYCOLAX) 17 g packet Take 17 g by mouth daily as needed for mild constipation. (Patient not taking: Reported on 11/02/2021) 14 each 0   Polyvinyl Alcohol-Povidone (REFRESH OP) Place 1 drop into both eyes 2 (two) times daily.     predniSONE (DELTASONE) 5 MG tablet Take 1-2 tablets (5-10 mg total) by mouth daily. (Patient taking differently: Take 5 mg by mouth daily with breakfast.) 180 tablet 3   primidone (MYSOLINE) 50 MG tablet Take 50-100 mg by mouth See admin instructions. Take 50 mg by mouth in the morning and 100 mg by mouth in the evening.     rosuvastatin (CRESTOR) 20 MG tablet Take 1 tablet (20 mg total) by mouth daily. (Patient taking differently: Take 20 mg by mouth at bedtime.) 90 tablet 3   sertraline (ZOLOFT) 100 MG tablet TAKE ONE TABLET BY MOUTH EVERY DAY 90 tablet 3   No current facility-administered medications for this visit.     Past Surgical History:  Procedure Laterality Date   ABDOMINAL HYSTERECTOMY  1991   for fibroids; ovaries still in   BIOPSY  03/17/2021   Procedure: BIOPSY;  Surgeon: Kathi Der, MD;  Location: [facility];  Service: Gastroenterology;;   BREAST BIOPSY  2001; 02/12/16   fibrocystic  breast disease in 2001 and again in 02/2016 (+scar from prev bx site w/calcifications).   CARDIOVASCULAR STRESS TEST  11/01/2017   Myocard perf imaging: NORMAL (EF >60-65%)   COLONOSCOPY  09/12/2013; 02/25/20   Normal 2015 and 2021: Recall 5 yrs ([facility], Dr. Evette Cristal) due to FH of colon polyps.   CYSTOSCOPY  2004   for chronic UTI   DEXA  10/2016   Bone density normal (T-score 0.0)   ESOPHAGOGASTRODUODENOSCOPY  04/19/2011  Nl except antral gastritis: bx = mild chron gastritis (H pyloria neg)    ESOPHAGOGASTRODUODENOSCOPY (EGD) WITH PROPOFOL N/A 03/27/2020   Esoph stenosis-->dilated.  Mod size hiatal hernia (Dig Hea Spec). Procedure: ESOPHAGOGASTRODUODENOSCOPY (EGD) WITH PROPOFOL ;  Surgeon: Wonda Horner, MD;  Location: [facility];  Service: Endoscopy;  Laterality: N/A;   ESOPHAGOGASTRODUODENOSCOPY (EGD) WITH PROPOFOL N/A 03/17/2021   Esoph stricture->dilated.  Large hiatal hernia. Also +esoph candidiasis, bx inflammation.  Procedure: ESOPHAGOGASTRODUODENOSCOPY (EGD) WITH PROPOFOL;  Surgeon: Otis Brace, MD;  Location: [facility];  Service: Gastroenterology;  Laterality: N/A;   EVENT MONITOR  10/2017   3 wk-->Symptoms correlate with sinus rhythm with PACs, at times runs of PACs up to 5 beats.   GALLBLADDER SURGERY  2019   intraarticular steroid injection  2013   3 R knee & L X 1; Dr Rushie Nyhan   KNEE ARTHROCENTESIS  2013   R knee x 3 & L X 1   KNEE ARTHROSCOPY  10/21/2011   Procedure: ARTHROSCOPY KNEE;  Surgeon: Tobi Bastos, MD;  Location: [facility];  Service: Orthopedics;  Laterality: Right;  WITH MEDIAL and lateral shaving of femoral chondyl  with microfracture technique of lateral and medial femoral chondyl suprapatellar synovectomy   LAPAROSCOPIC CHOLECYSTECTOMY  01/2018   SAVORY DILATION N/A 03/27/2020   Procedure: SAVORY DILATION;  Surgeon: Wonda Horner, MD;  Location: [facility];  Service: Endoscopy;  Laterality: N/A;   TOTAL KNEE ARTHROPLASTY   04/12/2012   RIGHT  Procedure: TOTAL KNEE ARTHROPLASTY;  Surgeon: Tobi Bastos, MD;  Location: [facility];  Service: Orthopedics;  Laterality: Right;  Right Total Knee Arthroplasty   TOTAL KNEE ARTHROPLASTY Left 05/26/2021   Procedure: TOTAL KNEE ARTHROPLASTY;  Surgeon: Paralee Cancel, MD;  Location: [facility];  Service: Orthopedics;  Laterality: Left;   TUBAL LIGATION  1981     Allergies  Allergen Reactions   Steri-Strip Compound Benzoin [Benzoin Compound] Anaphylaxis and Itching    Blisters and itching   Penicillins Rash    Tolerated Cephalosporin 05/26/21.     Pravastatin     Myalgias   Simvastatin     Myalgias   Hydrocodone-Acetaminophen Nausea And Vomiting   Hydroxychloroquine Sulfate Rash      Family History  Problem Relation Age of Onset   Diabetes Mother    Stroke Mother 38   Osteoarthritis Mother    Stroke Brother 64   Heart disease Father        CABG   Lung cancer Father    Prostate cancer Father    Pancreatic cancer Father    Pancreatic cancer Paternal Grandfather    Bipolar disorder Daughter    Anxiety disorder Son    Arthritis Maternal Grandmother        rheumatoid   Heart attack Brother 29       smoker     Social History Ms. Demir reports that she has never smoked. She has never used smokeless tobacco. Ms. Erman reports no history of alcohol use.   Review of Systems CONSTITUTIONAL: No weight loss, fever, chills, weakness or fatigue.  HEENT: Eyes: No visual loss, blurred vision, double vision or yellow sclerae.No hearing loss, sneezing, congestion, runny nose or sore throat.  SKIN: No rash or itching.  CARDIOVASCULAR: per hpi RESPIRATORY: No shortness of breath, cough or sputum.  GASTROINTESTINAL: No anorexia, nausea, vomiting or diarrhea. No abdominal pain or blood.  GENITOURINARY: No burning on urination, no polyuria NEUROLOGICAL: No headache, dizziness, syncope, paralysis, ataxia, numbness or  tingling in the extremities. No change in  bowel or bladder control.  MUSCULOSKELETAL: No muscle, back pain, joint pain or stiffness.  LYMPHATICS: No enlarged nodes. No history of splenectomy.  PSYCHIATRIC: No history of depression or anxiety.  ENDOCRINOLOGIC: No reports of sweating, cold or heat intolerance. No polyuria or polydipsia.  Marland Kitchen   Physical Examination Today's Vitals   12/03/21 1123  BP: 124/80  Pulse: 92  SpO2: 97%  Weight: 142 lb 9.6 oz (64.7 kg)  Height: 5' 8.5" (1.74 m)   Body mass index is 21.37 kg/m.  Gen: resting comfortably, no acute distress HEENT: no scleral icterus, pupils equal round and reactive, no palptable cervical adenopathy,  CV: RRR, no m/r/v no jvd Resp: Clear to auscultation bilaterally GI: abdomen is soft, non-tender, non-distended, normal bowel sounds, no hepatosplenomegaly MSK: extremities are warm, no edema.  Skin: warm, no rash Neuro:  no focal deficits Psych: appropriate affect   Diagnostic Studies   10/2017 nuclear stress There was no ST segment deviation noted during stress. The study is normal. There are no perfusion defects This is a low risk study. The left ventricular ejection fraction is hyperdynamic (>65%).     11/2017 Monitor 21 day event monitor Min HR 53, Max HR 146, Avg HR 76 Symptoms correlate with sinus rhythm with PACs, at times runs of PACs up to 5 beats  Assessment and Plan  1. Syncope/chest pain - unclear etiology. The syncopal event sounds likely vasovagal. The chest pain and SOB particularlty this is ongoing is of unclear etiology - ekg benign, echo overall benign - pcp ordered CT PE has not been done yet, we will see if we can get done at [facility] tomorrow. If negative would plan for lexiscan to further evaluate.       Arnoldo Lenis, M.D.

## 2021-12-03 NOTE — Patient Instructions (Signed)
Medication Instructions:  Your physician recommends that you continue on your current medications as directed. Please refer to the Current Medication list given to you today.   Labwork: none  Testing/Procedures: CT  Follow-Up: Your physician recommends that you schedule a follow-up appointment in: Follow up pending  Any Other Special Instructions Will Be Listed Below (If Applicable).  If you need a refill on your cardiac medications before your next appointment, please call your pharmacy.

## 2021-12-04 ENCOUNTER — Telehealth: Payer: Self-pay

## 2021-12-04 DIAGNOSIS — R0789 Other chest pain: Secondary | ICD-10-CM

## 2021-12-04 DIAGNOSIS — R079 Chest pain, unspecified: Secondary | ICD-10-CM

## 2021-12-04 NOTE — Telephone Encounter (Signed)
Patient notified and verbalized understanding. Pt agreeable with plan. Lexiscan ordered for CP per provider.

## 2021-12-04 NOTE — Telephone Encounter (Signed)
-----   Message from Antoine Poche, MD sent at 12/04/2021  1:21 PM EDT ----- CT scan is negative for blood clot. Can we order  a lexiscsan for her for chest pain   Dominga Ferry MD

## 2021-12-08 LAB — POCT I-STAT CREATININE: Creatinine, Ser: 0.7 mg/dL (ref 0.44–1.00)

## 2021-12-09 ENCOUNTER — Encounter: Payer: Self-pay | Admitting: Cardiology

## 2021-12-15 ENCOUNTER — Telehealth: Payer: Self-pay | Admitting: Cardiology

## 2021-12-15 NOTE — Telephone Encounter (Signed)
Checking percert on the following patient for testing scheduled at [facility].     LEXISCAN   12/30/2021

## 2021-12-18 ENCOUNTER — Other Ambulatory Visit: Payer: Self-pay | Admitting: Family Medicine

## 2021-12-21 NOTE — Telephone Encounter (Signed)
Requesting: Adderall 15mg   Contract: 04/13/21 UDS: 11/02/21 Last Visit: 11/02/21 Next Visit: 01/12/22 Last Refill: 11/02/21(30,0)  Requesting: diazepam 5mg   Contract: 04/13/21 UDS: 11/02/21 Last Visit: 11/02/21 Next Visit: 02/01/22 Last Refill: 12/03/20(60,5)  RF request for rosuvastatin 20mg  LOV: 11/02/21 Next ov: 02/01/22 Last written: 6/228/22(90,3)   RF request for promethazine LOV: 11/02/21 Next ov: 02/01/22 Last written: 02/25/21-11/02/21 (pt reported not taking)  Meds pending. Please review and advise

## 2021-12-29 ENCOUNTER — Other Ambulatory Visit: Payer: 59

## 2021-12-30 ENCOUNTER — Ambulatory Visit (HOSPITAL_COMMUNITY)
Admission: RE | Admit: 2021-12-30 | Discharge: 2021-12-30 | Disposition: A | Discharge status: Home / Self Care | Payer: 59 | Source: Ambulatory Visit | Attending: Cardiology | Admitting: Cardiology

## 2021-12-30 ENCOUNTER — Encounter (HOSPITAL_COMMUNITY): Payer: Self-pay

## 2021-12-30 DIAGNOSIS — R0789 Other chest pain: Secondary | ICD-10-CM | POA: Diagnosis present

## 2021-12-30 DIAGNOSIS — R079 Chest pain, unspecified: Secondary | ICD-10-CM | POA: Diagnosis present

## 2021-12-30 HISTORY — DX: Systemic involvement of connective tissue, unspecified: M35.9

## 2021-12-30 LAB — NM MYOCAR MULTI W/SPECT W/WALL MOTION / EF
LV dias vol: 49 mL (ref 46–106)
LV sys vol: 13 mL
Nuc Stress EF: 73 %
Peak HR: 106 {beats}/min
RATE: 0.3
Rest HR: 75 {beats}/min
Rest Nuclear Isotope Dose: 10.2 mCi
SDS: 2
SRS: 3
SSS: 5
ST Depression (mm): 0 mm
Stress Nuclear Isotope Dose: 32 mCi
TID: 0.73

## 2021-12-30 MED ORDER — TECHNETIUM TC 99M TETROFOSMIN IV KIT
30.0000 | PACK | Freq: Once | INTRAVENOUS | Status: AC | PRN
  Administered 2021-12-30: 32 via INTRAVENOUS

## 2021-12-30 MED ORDER — REGADENOSON 0.4 MG/5ML IV SOLN
INTRAVENOUS | Status: AC
  Administered 2021-12-30: 0.4 mg via INTRAVENOUS
  Filled 2021-12-30: qty 5

## 2021-12-30 MED ORDER — TECHNETIUM TC 99M TETROFOSMIN IV KIT
10.0000 | PACK | Freq: Once | INTRAVENOUS | Status: AC | PRN
  Administered 2021-12-30: 10.2 via INTRAVENOUS

## 2021-12-30 MED ORDER — SODIUM CHLORIDE FLUSH 0.9 % IV SOLN
INTRAVENOUS | Status: AC
  Administered 2021-12-30: 10 mL via INTRAVENOUS
  Filled 2021-12-30: qty 10

## 2022-01-05 ENCOUNTER — Telehealth: Payer: Self-pay

## 2022-01-06 ENCOUNTER — Telehealth: Payer: Self-pay | Admitting: Cardiology

## 2022-01-06 NOTE — Telephone Encounter (Signed)
Patient was returning call. Please advise ?

## 2022-01-06 NOTE — Telephone Encounter (Signed)
-----   Message from Antoine Poche, MD sent at 01/04/2022  1:22 PM EDT ----- Normal stress test, no evidence of any blockages. So far testing has been benign, how is she doing symptoms wise? Chest pain? Dizziness? Passing out?  Dominga Ferry MD

## 2022-01-06 NOTE — Telephone Encounter (Signed)
Patient informed and verbalized understanding of plan. Copy sent to PCP Says she is doing well. No symptoms or probems

## 2022-01-07 ENCOUNTER — Encounter: Payer: Self-pay | Admitting: Family Medicine

## 2022-02-01 ENCOUNTER — Encounter: Payer: Self-pay | Admitting: Family Medicine

## 2022-02-01 ENCOUNTER — Ambulatory Visit: Payer: 59 | Admitting: Family Medicine

## 2022-02-01 ENCOUNTER — Telehealth: Payer: Self-pay

## 2022-02-01 NOTE — Telephone Encounter (Signed)
noted 

## 2022-02-01 NOTE — Telephone Encounter (Signed)
No Show/Cancel within 24 hours  Patient called at 8am to cancel 9:40am appt - states she has to take grand baby to doctors appt. Patient states she will call back to resch appt

## 2022-02-01 NOTE — Progress Notes (Deleted)
OFFICE VISIT  02/01/2022  CC: No chief complaint on file.   HPI:    Patient is a 66 y.o. female who presents for  3 mo f/u HTN, HLD, adult ADD. A/P as of last visit: "#1 syncope. Vasovagal episode most likely.  However the chest pain that she then had is worrisome for possible cardiac event. No similar symptoms since that time (almost 2 mo ago).   Unclear if random, brief episodes of chest tightness and shortness of breath that she is having since then are related to excessive stress or not. She will continue on an aspirin a day and her beta-blocker as well as her statin. EKG today reassuring/unchanged. We will check echocardiogram. She is to keep her scheduled visit with her cardiologist on May 25. Of note, outpatient rhythm monitoring in April 2019 unremarkable except brief PACs.   2) hypertension, well controlled on lisinopril 20 mg a day and Lopressor 12.5 mg twice daily.   3) Adult ADD.  Stable.  Continue Adderall XR 15 mg a day.  Rx for #30 today. Substance contract is up-to-date. Urine drug screen today.   4) GAD, history of recurrent major depressive disorder. She wants to continue managing her stress without change in medication. Continue sertraline 100 mg a day."  INTERIM HX: ***   PMP AWARE reviewed today: most recent rx for Adderall was filled 11/02/2021, #30, rx by me. No red flags.  Past Medical History:  Diagnosis Date   Acute medial meniscus tear of right knee    Adrenal insufficiency (Addison's disease) (HCC) 02/2021   Anemia of chronic disease    Mild, Hb stable consistently   Anxiety and depression    Chest pain 10/2017   +Cardiac CT.  Myocardial perfusion imaging NORMAL, EF>65%   Collagen vascular disease (HCC)    Diabetes mellitus 1995   Managed by Dr. Elvera Lennox (Endo)   GERD (gastroesophageal reflux disease) per pt watches diet and take tums as needed   with hx of prox esoph stricture + dilation procedure   H/O hiatal hernia    slightly larger  ("moderate" size) on CT angio done to r/o PE 10/2017. noted on egd again 03/2021   History of adrenal insufficiency    in 2022 when she tried coming off her chronic daily prednisone for her RA   Hyperlipidemia    Hypertension    Interstitial cystitis    Lichen sclerosus of female genitalia    Migraine    Osteoarthritis, multiple sites    Palpitations 10/2017   3 wk event monitor: symptoms correlate with sinus rhythm with PACs, at times runs of PACs up to 5 beats.   Pseudogout of knee, left    colchicine helpful   Psoriasis    Seasonal allergies    Seropositive rheumatoid arthritis (HCC)    Hands, knees, jaw, elbow: responding to Simponi as of 09/29/15 rheum f/u.  Waning effectiveness on 03/2016 f/u so pt switched to Orencia injections.  Doing well on chronic low-dose prednisone therapy+ methotrexate +orencia as of 01/2019, 08/2019, 11/2019    Past Surgical History:  Procedure Laterality Date   ABDOMINAL HYSTERECTOMY  1991   for fibroids; ovaries still in   BIOPSY  03/17/2021   Procedure: BIOPSY;  Surgeon: Kathi Der, MD;  Location: [facility];  Service: Gastroenterology;;   BREAST BIOPSY  2001; 02/12/16   fibrocystic breast disease in 2001 and again in 02/2016 (+scar from prev bx site w/calcifications).   CARDIOVASCULAR STRESS TEST  11/01/2017   Myocard perf  imaging: NORMAL (EF >60-65%)   COLONOSCOPY  09/12/2013; 02/25/20   Normal 2015 and 2021: Recall 5 yrs ([facility], Dr. Evette Cristal) due to FH of colon polyps.   CYSTOSCOPY  2004   for chronic UTI   DEXA  10/2016   Bone density normal (T-score 0.0)   ESOPHAGOGASTRODUODENOSCOPY  04/19/2011   Nl except antral gastritis: bx = mild chron gastritis (H pyloria neg)    ESOPHAGOGASTRODUODENOSCOPY (EGD) WITH PROPOFOL N/A 03/27/2020   Esoph stenosis-->dilated.  Mod size hiatal hernia ([facility]). Procedure: ESOPHAGOGASTRODUODENOSCOPY (EGD) WITH PROPOFOL ;  Surgeon: Graylin Shiver, MD;  Location: [facility];  Service: Endoscopy;  Laterality:  N/A;   ESOPHAGOGASTRODUODENOSCOPY (EGD) WITH PROPOFOL N/A 03/17/2021   Esoph stricture->dilated.  Large hiatal hernia. Also +esoph candidiasis, bx inflammation.  Procedure: ESOPHAGOGASTRODUODENOSCOPY (EGD) WITH PROPOFOL;  Surgeon: Kathi Der, MD;  [facility]: [facility];  Service: Gastroenterology;  Laterality: N/A;   EVENT MONITOR  10/2017   3 wk-->Symptoms correlate with sinus rhythm with PACs, at times runs of PACs up to 5 beats.   GALLBLADDER SURGERY  2019   intraarticular steroid injection  2013   3 R knee & L X 1; Dr Netta Corrigan   KNEE ARTHROCENTESIS  2013   R knee x 3 & L X 1   KNEE ARTHROSCOPY  10/21/2011   Procedure: ARTHROSCOPY KNEE;  Surgeon: Jacki Cones, MD;  Location[facility];  Service: Orthopedics;  Laterality: Right;  WITH MEDIAL and lateral shaving of femoral chondyl  with microfracture technique of lateral and medial femoral chondyl suprapatellar synovectomy   LAPAROSCOPIC CHOLECYSTECTOMY  01/2018   myocardial perf imaging     Normal 12/2021   SAVORY DILATION N/A 03/27/2020   Procedure: SAVORY DILATION;  Surgeon: Graylin Shiver, MD;  Location: [facility];  Service: Endoscopy;  Laterality: N/A;   TOTAL KNEE ARTHROPLASTY  04/12/2012   RIGHT  Procedure: TOTAL KNEE ARTHROPLASTY;  Surgeon: Jacki Cones, MD;  Location: [facility];  Service: Orthopedics;  Laterality: Right;  Right Total Knee Arthroplasty   TOTAL KNEE ARTHROPLASTY Left 05/26/2021   Procedure: TOTAL KNEE ARTHROPLASTY;  Surgeon: Durene Romans, MD;  Location: [facility];  Service: Orthopedics;  Laterality: Left;   TUBAL LIGATION  1981    Outpatient Medications Prior to Visit  Medication Sig Dispense Refill   acetaminophen (TYLENOL) 500 MG tablet Take 1,000 mg by mouth every 6 (six) hours as needed for moderate pain.     amphetamine-dextroamphetamine (ADDERALL XR) 15 MG 24 hr capsule TAKE ONE CAPSULE BY MOUTH EVERY MORNING 30 capsule 0   aspirin EC 81 MG tablet Take 81 mg by mouth daily.  Swallow whole.     cetirizine (ZYRTEC) 10 MG tablet Take 10 mg by mouth daily.     Colchicine 0.6 MG CAPS Take 0.6 mg by mouth 2 (two) times daily.     diazepam (VALIUM) 5 MG tablet TAKE ONE TABLET BY MOUTH EVERY TWELVE HOURS AS NEEDED FOR MIGRAINE HEADACHES 60 tablet 5   docusate sodium (COLACE) 100 MG capsule Take 1 capsule (100 mg total) by mouth 2 (two) times daily. 10 capsule 0   folic acid (FOLVITE) 1 MG tablet Take 1 mg by mouth at bedtime.     leflunomide (ARAVA) 20 MG tablet Take 20 mg by mouth daily.     lisinopril (ZESTRIL) 20 MG tablet Take 1 tablet (20 mg total) by mouth daily. 90 tablet 3   metFORMIN (GLUCOPHAGE-XR) 500 MG 24 hr tablet Take 2 tablets (  1,000 mg total) by mouth 2 (two) times daily with a meal. 360 tablet 3   methotrexate 50 MG/2ML injection Inject 22.5 mg into the muscle every Monday.     metoprolol tartrate (LOPRESSOR) 25 MG tablet Take 0.5 tablets (12.5 mg total) by mouth 2 (two) times daily. 90 tablet 3   omeprazole (PRILOSEC) 40 MG capsule Take 1 capsule (40 mg total) by mouth 2 (two) times daily. 60 capsule 3   phenazopyridine (PYRIDIUM) 95 MG tablet Take 190 mg by mouth 2 (two) times daily as needed for pain.     polyethylene glycol (MIRALAX / GLYCOLAX) 17 g packet Take 17 g by mouth daily as needed for mild constipation. 14 each 0   Polyvinyl Alcohol-Povidone (REFRESH OP) Place 1 drop into both eyes 2 (two) times daily.     predniSONE (DELTASONE) 5 MG tablet Take 1-2 tablets (5-10 mg total) by mouth daily. (Patient taking differently: Take 5 mg by mouth daily with breakfast.) 180 tablet 3   primidone (MYSOLINE) 50 MG tablet Take 50-100 mg by mouth See admin instructions. Take 50 mg by mouth in the morning and 100 mg by mouth in the evening.     promethazine (PHENERGAN) 12.5 MG tablet TAKE 1-2 TABLETS BY MOUTH EVERY SIX hours AS NEEDED FOR nausea 30 tablet 2   rosuvastatin (CRESTOR) 20 MG tablet TAKE ONE TABLET BY MOUTH EVERY DAY 90 tablet 3   sertraline (ZOLOFT)  100 MG tablet TAKE ONE TABLET BY MOUTH EVERY DAY 90 tablet 3   No facility-administered medications prior to visit.    Allergies  Allergen Reactions   Steri-Strip Compound Benzoin [Benzoin Compound] Anaphylaxis and Itching    Blisters and itching   Penicillins Rash    Tolerated Cephalosporin 05/26/21.     Pravastatin     Myalgias   Simvastatin     Myalgias   Hydrocodone-Acetaminophen Nausea And Vomiting   Hydroxychloroquine Sulfate Rash    ROS As per HPI  PE:    12/03/2021   11:23 AM 11/02/2021    9:36 AM 08/04/2021    9:40 AM  Vitals with BMI  Height 5' 8.5" 5' 8.5" 5' 8.5"  Weight 142 lbs 10 oz 142 lbs 6 oz 139 lbs 13 oz  BMI 21.36 21.33 20.94  Systolic 124 135 258  Diastolic 80 84 85  Pulse 92 80 81     Physical Exam  ***  LABS:  Last CBC Lab Results  Component Value Date   WBC 6.2 11/02/2021   HGB 11.0 (L) 11/02/2021   HCT 33.6 (L) 11/02/2021   MCV 78.0 11/02/2021   MCH 27.5 05/27/2021   RDW 15.7 (H) 11/02/2021   PLT 265.0 11/02/2021   Last metabolic panel Lab Results  Component Value Date   GLUCOSE 211 (H) 11/02/2021   NA 137 11/02/2021   K 4.4 11/02/2021   CL 104 11/02/2021   CO2 24 11/02/2021   BUN 11 11/02/2021   CREATININE 0.70 12/03/2021   GFRNONAA >60 05/27/2021   CALCIUM 8.7 11/02/2021   PROT 6.5 11/02/2021   ALBUMIN 3.8 11/02/2021   BILITOT 0.3 11/02/2021   ALKPHOS 86 11/02/2021   AST 30 11/02/2021   ALT 30 11/02/2021   ANIONGAP 7 05/27/2021   Last lipids Lab Results  Component Value Date   CHOL 108 03/31/2021   HDL 32.20 (L) 03/31/2021   LDLCALC 65 09/22/2020   LDLDIRECT 47.0 03/31/2021   TRIG 209.0 (H) 03/31/2021   CHOLHDL 3 03/31/2021   Last hemoglobin  A1c Lab Results  Component Value Date   HGBA1C 5.6 04/30/2021   Last thyroid functions Lab Results  Component Value Date   TSH 1.37 11/09/2019   IMPRESSION AND PLAN:  No problem-specific Assessment & Plan notes found for this encounter.   An After Visit  Summary was printed and given to the patient.  FOLLOW UP: No follow-ups on file.  Signed:  Santiago Bumpers, MD           02/01/2022

## 2022-02-02 ENCOUNTER — Encounter: Payer: Self-pay | Admitting: Family Medicine

## 2022-02-17 ENCOUNTER — Encounter: Payer: Self-pay | Admitting: Family Medicine

## 2022-02-17 ENCOUNTER — Ambulatory Visit (INDEPENDENT_AMBULATORY_CARE_PROVIDER_SITE_OTHER): Payer: 59 | Admitting: Family Medicine

## 2022-02-17 VITALS — BP 102/67 | HR 113 | Temp 99.1°F | Ht 68.5 in | Wt 133.4 lb

## 2022-02-17 DIAGNOSIS — E119 Type 2 diabetes mellitus without complications: Secondary | ICD-10-CM | POA: Diagnosis not present

## 2022-02-17 DIAGNOSIS — M7918 Myalgia, other site: Secondary | ICD-10-CM

## 2022-02-17 DIAGNOSIS — R5383 Other fatigue: Secondary | ICD-10-CM

## 2022-02-17 DIAGNOSIS — M1120 Other chondrocalcinosis, unspecified site: Secondary | ICD-10-CM

## 2022-02-17 DIAGNOSIS — M069 Rheumatoid arthritis, unspecified: Secondary | ICD-10-CM

## 2022-02-17 MED ORDER — AMPHETAMINE-DEXTROAMPHET ER 15 MG PO CP24: ORAL_CAPSULE | ORAL | 0 refills | Status: DC

## 2022-02-17 MED ORDER — SERTRALINE HCL 100 MG PO TABS
100.0000 mg | ORAL_TABLET | Freq: Every day | ORAL | 1 refills | Status: DC
Start: 2022-02-17 — End: 2022-06-14

## 2022-02-17 MED ORDER — ALBUTEROL SULFATE HFA 108 (90 BASE) MCG/ACT IN AERS
2.0000 | INHALATION_SPRAY | Freq: Four times a day (QID) | RESPIRATORY_TRACT | 0 refills | Status: AC | PRN
Start: 2022-02-17 — End: ?

## 2022-02-17 MED ORDER — PREDNISONE 10 MG PO TABS: ORAL_TABLET | ORAL | 0 refills | Status: DC

## 2022-02-17 NOTE — Progress Notes (Signed)
OFFICE VISIT  02/17/2022  CC:  Chief Complaint  Patient presents with   Diabetes   Hypertension   Patient is a 66 y.o. female who presents for scheduled follow-up for hypertension, hyperlipidemia, and adult ADD.  INTERIM HX: Not feeling well overall. Wrists swelling and hurting, very stiff, similar symptoms for all the joints of her fingers but mainly MCPs. None of them turn red.  She does note diffuse pale or bluish hue to all fingers at times.  Does not depend on temperature outside.  Both elbows hurt some but do not swell or turn red.  Hurts over the deltoids bilaterally but not particularly the shoulder joints.  She has chronic neck pain and stiffness.   No hips or lower extremity pain. Has tingling in the tip of the right thumb, otherwise no paresthesias. Her appetite is poor, she has some nausea occasionally without vomiting, she has some intermittent dizziness upon standing.  Her sugars are in the 210-250 range both fasting and postprandial.   She had an episode of syncope and some chest pain a few months ago. Workup for this over the last couple of months has been unrevealing/reassuring.  Most recent follow-up with her rheumatologist was at the end of June. Patient was continued on methotrexate, leflunomide, colchicine, and 5 mg prednisone daily. There was discussion of possibly starting a new biologic and patient was given the names of a few to ask her insurer about.  ROS as above, plus--> she has intermittent feeling of shortness of breath/chest tightness when doing a lot of exertion, says she feels like she has "air trapping".   She typically has 2-3 loose stools per day, says this is ever since getting on metformin. No fevers, no CP, no wheezing, no cough, no dizziness, no HAs, no rashes, no melena/hematochezia.  No polyuria or polydipsia.  No focal weakness, paresthesias, or tremors.  No acute vision or hearing abnormalities.  No dysuria or unusual/new urinary urgency or  frequency.  No recent changes in lower legs. No abdominal pain.  No palpitations.     Past Medical History:  Diagnosis Date   Acute medial meniscus tear of right knee    Adrenal insufficiency (Addison's disease) ([place]) 02/2021   Anemia of chronic disease    Mild, Hb stable consistently   Anxiety and depression    Chest pain 10/2017   +Cardiac CT.  Myocardial perfusion imaging NORMAL, EF>65%   Collagen vascular disease ([place])    Diabetes mellitus 1995   Managed by Dr. Cruzita Lederer (Endo)   GERD (gastroesophageal reflux disease) per pt watches diet and take tums as needed   with hx of prox esoph stricture + dilation procedure   H/O hiatal hernia    slightly larger ("moderate" size) on CT angio done to r/o PE 10/2017. noted on egd again 03/2021   History of adrenal insufficiency    in 2022 when she tried coming off her chronic daily prednisone for her RA   Hyperlipidemia    Hypertension    Interstitial cystitis    Lichen sclerosus of female genitalia    Migraine    Osteoarthritis, multiple sites    Palpitations 10/2017   3 wk event monitor: symptoms correlate with sinus rhythm with PACs, at times runs of PACs up to 5 beats.   Pseudogout of knee, left    colchicine helpful   Psoriasis    Seasonal allergies    Seropositive rheumatoid arthritis (HCC)    Hands, knees, jaw, elbow: responding to Simponi as  of 09/29/15 rheum f/u.  Waning effectiveness on 03/2016 f/u so pt switched to Orencia injections.  Doing well on chronic low-dose prednisone therapy+ methotrexate +orencia as of 01/2019, 08/2019, 11/2019    Past Surgical History:  Procedure Laterality Date   ABDOMINAL HYSTERECTOMY  1991   for fibroids; ovaries still in   BIOPSY  03/17/2021   Procedure: BIOPSY;  Surgeon: Otis Brace, MD;  [facility]: [facility];  Service: Gastroenterology;;   BREAST BIOPSY  2001; 02/12/16   fibrocystic breast disease in 2001 and again in 02/2016 (+scar from prev bx site w/calcifications).   CARDIOVASCULAR  STRESS TEST  11/01/2017   Myocard perf imaging: NORMAL (EF >60-65%).  12/30/21 normal   COLONOSCOPY  09/12/2013; 02/25/20   Normal 2015 and 2021: Recall 5 yrs ([facility], Dr. Penelope Coop) due to [place] of colon polyps.   CYSTOSCOPY  2004   for chronic UTI   DEXA  10/2016   Bone density normal (T-score 0.0)   ESOPHAGOGASTRODUODENOSCOPY  04/19/2011   Nl except antral gastritis: bx = mild chron gastritis (H pyloria neg)    ESOPHAGOGASTRODUODENOSCOPY (EGD) WITH PROPOFOL N/A 03/27/2020   Esoph stenosis-->dilated.  Mod size hiatal hernia ([facility]). Procedure: ESOPHAGOGASTRODUODENOSCOPY (EGD) WITH PROPOFOL ;  Surgeon: Wonda Horner, MD;  [facility]: [facility];  Service: Endoscopy;  Laterality: N/A;   ESOPHAGOGASTRODUODENOSCOPY (EGD) WITH PROPOFOL N/A 03/17/2021   Esoph stricture->dilated.  Large hiatal hernia. Also +esoph candidiasis, bx inflammation.  Procedure: ESOPHAGOGASTRODUODENOSCOPY (EGD) WITH PROPOFOL;  Surgeon: Otis Brace, MD;  [facility]: [facility];  Service: Gastroenterology;  Laterality: N/A;   EVENT MONITOR  10/2017   3 wk-->Symptoms correlate with sinus rhythm with PACs, at times runs of PACs up to 5 beats.   GALLBLADDER SURGERY  2019   intraarticular steroid injection  2013   3 R knee & L X 1; Dr Rushie Nyhan   KNEE ARTHROCENTESIS  2013   R knee x 3 & L X 1   KNEE ARTHROSCOPY  10/21/2011   Procedure: ARTHROSCOPY KNEE;  Surgeon: Tobi Bastos, MD;  Location: [facility];  Service: Orthopedics;  Laterality: Right;  WITH MEDIAL and lateral shaving of femoral chondyl  with microfracture technique of lateral and medial femoral chondyl suprapatellar synovectomy   LAPAROSCOPIC CHOLECYSTECTOMY  01/2018   myocardial perf imaging     Normal 12/2021   SAVORY DILATION N/A 03/27/2020   Procedure: SAVORY DILATION;  Surgeon: Wonda Horner, MD;  Location: [facility];  Service: Endoscopy;  Laterality: N/A;   TOTAL KNEE ARTHROPLASTY  04/12/2012   RIGHT  Procedure: TOTAL KNEE  ARTHROPLASTY;  Surgeon: Tobi Bastos, MD;  Location: [facility];  Service: Orthopedics;  Laterality: Right;  Right Total Knee Arthroplasty   TOTAL KNEE ARTHROPLASTY Left 05/26/2021   Procedure: TOTAL KNEE ARTHROPLASTY;  Surgeon: Paralee Cancel, MD;  Location: [facility];  Service: Orthopedics;  Laterality: Left;   TRANSTHORACIC ECHOCARDIOGRAM     EF 65-70%, mild dec RV fxn, grd I DD, o/w normal   TUBAL LIGATION  1981    Outpatient Medications Prior to Visit  Medication Sig Dispense Refill   acetaminophen (TYLENOL) 500 MG tablet Take 1,000 mg by mouth every 6 (six) hours as needed for moderate pain.     aspirin EC 81 MG tablet Take 81 mg by mouth daily. Swallow whole.     cetirizine (ZYRTEC) 10 MG tablet Take 10 mg by mouth daily.     Colchicine 0.6 MG CAPS Take 0.6 mg by mouth 2 (two) times  daily.     diazepam (VALIUM) 5 MG tablet TAKE ONE TABLET BY MOUTH EVERY TWELVE HOURS AS NEEDED FOR MIGRAINE HEADACHES 60 tablet 5   docusate sodium (COLACE) 100 MG capsule Take 1 capsule (100 mg total) by mouth 2 (two) times daily. 10 capsule 0   folic acid (FOLVITE) 1 MG tablet Take 1 mg by mouth at bedtime.     leflunomide (ARAVA) 20 MG tablet Take 20 mg by mouth daily.     lisinopril (ZESTRIL) 20 MG tablet Take 1 tablet (20 mg total) by mouth daily. 90 tablet 3   metFORMIN (GLUCOPHAGE-XR) 500 MG 24 hr tablet Take 2 tablets (1,000 mg total) by mouth 2 (two) times daily with a meal. 360 tablet 3   methotrexate 50 MG/2ML injection Inject 22.5 mg into the muscle every Monday.     metoprolol tartrate (LOPRESSOR) 25 MG tablet Take 0.5 tablets (12.5 mg total) by mouth 2 (two) times daily. 90 tablet 3   omeprazole (PRILOSEC) 40 MG capsule Take 1 capsule (40 mg total) by mouth 2 (two) times daily. 60 capsule 3   phenazopyridine (PYRIDIUM) 95 MG tablet Take 190 mg by mouth 2 (two) times daily as needed for pain.     polyethylene glycol (MIRALAX / GLYCOLAX) 17 g packet Take 17 g by mouth daily as needed for mild  constipation. 14 each 0   Polyvinyl Alcohol-Povidone (REFRESH OP) Place 1 drop into both eyes 2 (two) times daily.     predniSONE (DELTASONE) 5 MG tablet Take 1-2 tablets (5-10 mg total) by mouth daily. (Patient taking differently: Take 5 mg by mouth daily with breakfast.) 180 tablet 3   primidone (MYSOLINE) 50 MG tablet Take 50-100 mg by mouth See admin instructions. Take 50 mg by mouth in the morning and 100 mg by mouth in the evening.     promethazine (PHENERGAN) 12.5 MG tablet TAKE 1-2 TABLETS BY MOUTH EVERY SIX hours AS NEEDED FOR nausea 30 tablet 2   rosuvastatin (CRESTOR) 20 MG tablet TAKE ONE TABLET BY MOUTH EVERY DAY 90 tablet 3   amphetamine-dextroamphetamine (ADDERALL XR) 15 MG 24 hr capsule TAKE ONE CAPSULE BY MOUTH EVERY MORNING 30 capsule 0   sertraline (ZOLOFT) 100 MG tablet TAKE ONE TABLET BY MOUTH EVERY DAY 90 tablet 3   No facility-administered medications prior to visit.    Allergies  Allergen Reactions   Steri-Strip Compound Benzoin [Benzoin Compound] Anaphylaxis and Itching    Blisters and itching   Penicillins Rash    Tolerated Cephalosporin 05/26/21.     Pravastatin     Myalgias   Simvastatin     Myalgias   Hydrocodone-Acetaminophen Nausea And Vomiting   Hydroxychloroquine Sulfate Rash    ROS As per HPI  PE:    02/17/2022    1:19 PM 12/03/2021   11:23 AM 11/02/2021    9:36 AM  Vitals with BMI  Height 5' 8.5" 5' 8.5" 5' 8.5"  Weight 133 lbs 6 oz 142 lbs 10 oz 142 lbs 6 oz  BMI 19.99 29.93 71.69  Systolic 678 938 101  Diastolic 67 80 84  Pulse 751 92 80     Physical Exam  General: Alert, chronically ill-appearing.  No acute distress. Affect is pleasant, lucid thought and speech. WCH:ENID: no injection, icteris, swelling, or exudate.  EOMI, PERRLA. Mouth: lips without lesion/swelling.  Oral mucosa pink and moist. Oropharynx without erythema, exudate, or swelling.  Neck: no nodule or TM CV: Regular, tachy to 110-120 range, no m/r/g.  LUNGS: CTA  bilat, nonlabored resps, good aeration in all lung fields. ABD: soft, NT/ND EXT: no clubbing or cyanosis.  no edema.  Musculoskeletal: Mild tenderness to palpation over the deltoids bilaterally. Mild to moderate tender to palpation over both wrists and first CMC and MTP bilateral. Remainder of finger joints are nontender. She has mild effusion of the right wrist.  She has about 25% normal range of motion of the wrists. She can flex MCPs to about 70 degrees.  Weak grip strength bilaterally  LABS:  Last CBC Lab Results  Component Value Date   WBC 6.2 11/02/2021   HGB 11.0 (L) 11/02/2021   HCT 33.6 (L) 11/02/2021   MCV 78.0 11/02/2021   MCH 27.5 05/27/2021   RDW 15.7 (H) 11/02/2021   PLT 265.0 11/02/2021   Lab Results  Component Value Date   IRON 30 (L) 11/02/2021   TIBC 375 11/02/2021   FERRITIN 53 11/02/2021   Lab Results  Component Value Date   VITAMINB12 267 63/78/5885   Last metabolic panel Lab Results  Component Value Date   GLUCOSE 211 (H) 11/02/2021   NA 137 11/02/2021   K 4.4 11/02/2021   CL 104 11/02/2021   CO2 24 11/02/2021   BUN 11 11/02/2021   CREATININE 0.70 12/03/2021   GFRNONAA >60 05/27/2021   CALCIUM 8.7 11/02/2021   PROT 6.5 11/02/2021   ALBUMIN 3.8 11/02/2021   BILITOT 0.3 11/02/2021   ALKPHOS 86 11/02/2021   AST 30 11/02/2021   ALT 30 11/02/2021   ANIONGAP 7 05/27/2021   Last lipids Lab Results  Component Value Date   CHOL 108 03/31/2021   HDL 32.20 (L) 03/31/2021   LDLCALC 65 09/22/2020   LDLDIRECT 47.0 03/31/2021   TRIG 209.0 (H) 03/31/2021   CHOLHDL 3 03/31/2021   Last hemoglobin A1c Lab Results  Component Value Date   HGBA1C 5.6 04/30/2021   Last thyroid functions Lab Results  Component Value Date   TSH 1.37 11/09/2019   Lab Results  Component Value Date   LABURIC 5.0 12/05/2007   IMPRESSION AND PLAN:  #1 rheumatoid arthritis: Has active inflammatory arthritis/synovitis of both wrists, right greater than left.   Additionally both first [place] and MCP. Will increase prednisone to 20 mg a day x 10 days, then decrease to 10 mg a day x 10 days. (Bedside ultrasound today: Confirmed synovitis changes but I did not feel like there was enough of an effusion to aspirate).  Low suspicion of septic arthritis. She will continue her current regimen of methotrexate and leflunomide. She requests new rheumatology referral for second opinion--- I ordered this today.  #2 fatigue/malaise.  Some other nonspecific symptoms are occurring as well: Poor appetite, intermittent dizziness, some shoulder and neck myalgias. Question adrenal insufficiency from chronic prednisone.  She may need a higher daily dose of prednisone and then slow taper. See #1 above.  #3 hypotension, tachycardia. I do think she is relatively dehydrated due to poor intake. Will have her discontinue her metoprolol completely.  #4 type 2 diabetes, poor control. She is on maximum dose of metformin XR. With increasing steroids plus recent inadequate control we will likely need to get her on insulin at least in the short-term. She has managed by Dr. Cruzita Lederer but has not seen her in some time now and will be arranging follow-up pretty soon.  In the meantime, we will see what her hemoglobin A1c is and go from there.  #5 adult ADD. Doing well long-term on Adderall XR 15  mg a day. #30 prescribed today. Controlled substance contract and urine drug screen are up-to-date.  Today I have ordered CBC with differential, c-Met, sed rate, CPK, hemoglobin A1c, and TSH.  An After Visit Summary was printed and given to the patient.  FOLLOW UP: Return in about 2 weeks (around 03/03/2022) for f/u fatigue/arthritis.  Signed:  Crissie Sickles, MD           02/17/2022

## 2022-02-18 ENCOUNTER — Telehealth: Payer: Self-pay

## 2022-02-18 LAB — CBC WITH DIFFERENTIAL/PLATELET
Basophils Absolute: 0.1 10*3/uL (ref 0.0–0.1)
Basophils Relative: 0.8 % (ref 0.0–3.0)
Eosinophils Absolute: 0.1 10*3/uL (ref 0.0–0.7)
Eosinophils Relative: 1.2 % (ref 0.0–5.0)
HCT: 34.7 % — ABNORMAL LOW (ref 36.0–46.0)
Hemoglobin: 11.3 g/dL — ABNORMAL LOW (ref 12.0–15.0)
Lymphocytes Relative: 19.5 % (ref 12.0–46.0)
Lymphs Abs: 1.4 10*3/uL (ref 0.7–4.0)
MCHC: 32.5 g/dL (ref 30.0–36.0)
MCV: 76.5 fl — ABNORMAL LOW (ref 78.0–100.0)
Monocytes Absolute: 0.7 10*3/uL (ref 0.1–1.0)
Monocytes Relative: 8.9 % (ref 3.0–12.0)
Neutro Abs: 5.1 10*3/uL (ref 1.4–7.7)
Neutrophils Relative %: 69.6 % (ref 43.0–77.0)
Platelets: 327 10*3/uL (ref 150.0–400.0)
RBC: 4.54 Mil/uL (ref 3.87–5.11)
RDW: 16.8 % — ABNORMAL HIGH (ref 11.5–15.5)
WBC: 7.4 10*3/uL (ref 4.0–10.5)

## 2022-02-18 LAB — COMPREHENSIVE METABOLIC PANEL
ALT: 17 U/L (ref 0–35)
AST: 16 U/L (ref 0–37)
Albumin: 3.8 g/dL (ref 3.5–5.2)
Alkaline Phosphatase: 79 U/L (ref 39–117)
BUN: 14 mg/dL (ref 6–23)
CO2: 22 mEq/L (ref 19–32)
Calcium: 8.6 mg/dL (ref 8.4–10.5)
Chloride: 98 mEq/L (ref 96–112)
Creatinine, Ser: 0.75 mg/dL (ref 0.40–1.20)
GFR: 82.83 mL/min (ref >60.00)
Glucose, Bld: 179 mg/dL — ABNORMAL HIGH (ref 70–99)
Potassium: 4 mEq/L (ref 3.5–5.1)
Sodium: 134 mEq/L — ABNORMAL LOW (ref 135–145)
Total Bilirubin: 0.4 mg/dL (ref 0.2–1.2)
Total Protein: 6.5 g/dL (ref 6.0–8.3)

## 2022-02-18 LAB — CK: Total CK: 16 U/L (ref 7–177)

## 2022-02-18 LAB — SEDIMENTATION RATE: Sed Rate: 67 mm/hr — ABNORMAL HIGH (ref 0–30)

## 2022-02-18 LAB — TSH: TSH: 1.24 u[IU]/mL (ref 0.35–5.50)

## 2022-02-18 LAB — HEMOGLOBIN A1C: Hgb A1c MFr Bld: 9.2 % — ABNORMAL HIGH (ref 4.6–6.5)

## 2022-02-18 NOTE — Telephone Encounter (Signed)
Call from [facility] Dr. Deanne Coffer - declined to see patient because they do not see patients for "second opinions".  Documented in provider notes from [facility] 02/17/22 (copied and pasted) "She requests new rheumatology referral for second opinion--- I ordered this today."  Please change location to another rheumatology provider for a second opinion per pt request.    Thank you.

## 2022-02-25 ENCOUNTER — Telehealth: Payer: Self-pay

## 2022-02-25 DIAGNOSIS — M069 Rheumatoid arthritis, unspecified: Secondary | ICD-10-CM

## 2022-02-25 NOTE — Telephone Encounter (Signed)
Referral moved 

## 2022-02-25 NOTE — Telephone Encounter (Signed)
-----   Message from Jeoffrey Massed, MD sent at 02/24/2022 11:02 PM EDT ----- Hba1c up to 9.2%.  I defer to her endocrinologist, Dr. Elvera Lennox, for ongoing management of her DM.  Pls encourage pt to make f/u appt with her. Also, Dr. Deanne Coffer refused to see her for 2nd opinion for her rheumatoid arthritis. Pls order new referral for [facility] rheumatology. Continue with medication plan as per our o/v 02/17/22. -thx

## 2022-03-03 ENCOUNTER — Ambulatory Visit (INDEPENDENT_AMBULATORY_CARE_PROVIDER_SITE_OTHER): Payer: 59 | Admitting: Family Medicine

## 2022-03-03 ENCOUNTER — Encounter: Payer: Self-pay | Admitting: Family Medicine

## 2022-03-03 VITALS — BP 107/68 | HR 84 | Temp 98.7°F | Ht 68.5 in | Wt 137.4 lb

## 2022-03-03 DIAGNOSIS — I959 Hypotension, unspecified: Secondary | ICD-10-CM

## 2022-03-03 DIAGNOSIS — I10 Essential (primary) hypertension: Secondary | ICD-10-CM

## 2022-03-03 DIAGNOSIS — M059 Rheumatoid arthritis with rheumatoid factor, unspecified: Secondary | ICD-10-CM

## 2022-03-03 DIAGNOSIS — M19049 Primary osteoarthritis, unspecified hand: Secondary | ICD-10-CM | POA: Diagnosis not present

## 2022-03-03 MED ORDER — OMEPRAZOLE 40 MG PO CPDR: 40.0000 mg | DELAYED_RELEASE_CAPSULE | Freq: Two times a day (BID) | ORAL | 3 refills | Status: DC

## 2022-03-03 MED ORDER — LISINOPRIL 20 MG PO TABS: ORAL_TABLET | ORAL | 3 refills | Status: DC

## 2022-03-03 MED ORDER — AMPHETAMINE-DEXTROAMPHET ER 15 MG PO CP24: ORAL_CAPSULE | ORAL | 0 refills | Status: DC

## 2022-03-03 NOTE — Progress Notes (Signed)
OFFICE VISIT  03/03/2022  CC:  Chief Complaint  Patient presents with   Fatigue    Patient is a 66 y.o. female who presents for 2-week follow-up arthritis and fatigue. A/P as of last visit: "#1 rheumatoid arthritis: Has active inflammatory arthritis/synovitis of both wrists, right greater than left.  Additionally both first [place] and MCP. Will increase prednisone to 20 mg a day x 10 days, then decrease to 10 mg a day x 10 days. (Bedside ultrasound today: Confirmed synovitis changes but I did not feel like there was enough of an effusion to aspirate).  Low suspicion of septic arthritis. She will continue her current regimen of methotrexate and leflunomide. She requests new rheumatology referral for second opinion--- I ordered this today.   #2 fatigue/malaise.  Some other nonspecific symptoms are occurring as well: Poor appetite, intermittent dizziness, some shoulder and neck myalgias. Question adrenal insufficiency from chronic prednisone.  She may need a higher daily dose of prednisone and then slow taper. See #1 above.   #3 hypotension, tachycardia. I do think she is relatively dehydrated due to poor intake. Will have her discontinue her metoprolol completely.   #4 type 2 diabetes, poor control. She is on maximum dose of metformin XR. With increasing steroids plus recent inadequate control we will likely need to get her on insulin at least in the short-term. She has managed by Dr. Cruzita Lederer but has not seen her in some time now and will be arranging follow-up pretty soon.  In the meantime, we will see what her hemoglobin A1c is and go from there.   #5 adult ADD. Doing well long-term on Adderall XR 15 mg a day. #30 prescribed today. Controlled substance contract and urine drug screen are up-to-date.   Today I have ordered CBC with differential, c-Met, sed rate, CPK, hemoglobin A1c, and TSH."  INTERIM HX: Delenn is doing much better--> hands without any significant pain lately,  minimal stiffness.  Her appetite has picked up and energy level is picking up. She is about to finish up her 10 mg daily prednisone dose. Says the increased prednisone dose did not make her glucoses worse.  When she stopped her metoprolol she noticed her palpitations increased some so she decided to restart 12.5 mg metoprolol twice daily and she cut her lisinopril dose in half.  Home blood pressures have been in the low normal range and she says that she does not feel like she is symptomatic from this.  Status of rheumatology referral: [place] declined to see her, stating they do not do second opinions. Patient feels better about this whole process now and wants to just stick with her current rheumatology provider.  She has plans to follow-up with them pretty soon and start Actemra.  Past Medical History:  Diagnosis Date   Acute medial meniscus tear of right knee    Adrenal insufficiency (Addison's disease) ([place]) 02/2021   Anemia of chronic disease    Mild, Hb stable consistently   Anxiety and depression    Chest pain 10/2017   +Cardiac CT.  Myocardial perfusion imaging NORMAL, EF>65%   Collagen vascular disease ([place])    Diabetes mellitus 1995   Managed by Dr. Cruzita Lederer (Endo)   GERD (gastroesophageal reflux disease) per pt watches diet and take tums as needed   with hx of prox esoph stricture + dilation procedure   H/O hiatal hernia    slightly larger ("moderate" size) on CT angio done to r/o PE 10/2017. noted on egd again  03/2021   History of adrenal insufficiency    in 2022 when she tried coming off her chronic daily prednisone for her RA   Hyperlipidemia    Hypertension    Interstitial cystitis    Lichen sclerosus of female genitalia    Migraine    Osteoarthritis, multiple sites    Palpitations 10/2017   3 wk event monitor: symptoms correlate with sinus rhythm with PACs, at times runs of PACs up to 5 beats.   Pseudogout of knee, left    colchicine helpful    Psoriasis    Seasonal allergies    Seropositive rheumatoid arthritis ([place])    Hands, knees, jaw, elbow: responding to Simponi as of 09/29/15 rheum f/u.  Waning effectiveness on 03/2016 f/u so pt switched to Orencia injections.  Doing well on chronic low-dose prednisone therapy+ methotrexate +orencia as of 01/2019, 08/2019, 11/2019    Past Surgical History:  Procedure Laterality Date   ABDOMINAL HYSTERECTOMY  1991   for fibroids; ovaries still in   BIOPSY  03/17/2021   Procedure: BIOPSY;  Surgeon: Otis Brace, MD;  Location: [facility];  Service: Gastroenterology;;   BREAST BIOPSY  2001; 02/12/16   fibrocystic breast disease in 2001 and again in 02/2016 (+scar from prev bx site w/calcifications).   CARDIOVASCULAR STRESS TEST  11/01/2017   Myocard perf imaging: NORMAL (EF >60-65%).  12/30/21 normal   COLONOSCOPY  09/12/2013; 02/25/20   Normal 2015 and 2021: Recall 5 yrs ([facility], Dr. Penelope Coop) due to [place] of colon polyps.   CYSTOSCOPY  2004   for chronic UTI   DEXA  10/2016   Bone density normal (T-score 0.0)   ESOPHAGOGASTRODUODENOSCOPY  04/19/2011   Nl except antral gastritis: bx = mild chron gastritis (H pyloria neg)    ESOPHAGOGASTRODUODENOSCOPY (EGD) WITH PROPOFOL N/A 03/27/2020   Esoph stenosis-->dilated.  Mod size hiatal hernia ([place] [facility]). Procedure: ESOPHAGOGASTRODUODENOSCOPY (EGD) WITH PROPOFOL ;  Surgeon: Wonda Horner, MD;  Location: [facility];  Service: Endoscopy;  Laterality: N/A;   ESOPHAGOGASTRODUODENOSCOPY (EGD) WITH PROPOFOL N/A 03/17/2021   Esoph stricture->dilated.  Large hiatal hernia. Also +esoph candidiasis, bx inflammation.  Procedure: ESOPHAGOGASTRODUODENOSCOPY (EGD) WITH PROPOFOL;  Surgeon: Otis Brace, MD;  Location: [facility];  Service: Gastroenterology;  Laterality: N/A;   EVENT MONITOR  10/2017   3 wk-->Symptoms correlate with sinus rhythm with PACs, at times runs of PACs up to 5 beats.   GALLBLADDER SURGERY  2019   intraarticular steroid injection   2013   3 R knee & L X 1; Dr Rushie Nyhan   KNEE ARTHROCENTESIS  2013   R knee x 3 & L X 1   KNEE ARTHROSCOPY  10/21/2011   Procedure: ARTHROSCOPY KNEE;  Surgeon: Tobi Bastos, MD;  Location[facility];  Service: Orthopedics;  Laterality: Right;  WITH MEDIAL and lateral shaving of femoral chondyl  with microfracture technique of lateral and medial femoral chondyl suprapatellar synovectomy   LAPAROSCOPIC CHOLECYSTECTOMY  01/2018   myocardial perf imaging     Normal 12/2021   SAVORY DILATION N/A 03/27/2020   Procedure: SAVORY DILATION;  Surgeon: Wonda Horner, MD;  Location: [facility];  Service: Endoscopy;  Laterality: N/A;   TOTAL KNEE ARTHROPLASTY  04/12/2012   RIGHT  Procedure: TOTAL KNEE ARTHROPLASTY;  Surgeon: Tobi Bastos, MD;  Location: [facility];  Service: Orthopedics;  Laterality: Right;  Right Total Knee Arthroplasty   TOTAL KNEE ARTHROPLASTY Left 05/26/2021   Procedure: TOTAL KNEE ARTHROPLASTY;  Surgeon: Paralee Cancel,  MD;  Location: [facility];  Service: Orthopedics;  Laterality: Left;   TRANSTHORACIC ECHOCARDIOGRAM  11/13/2021   EF 65-70%, mild dec RV fxn, grd I DD, o/w normal   TUBAL LIGATION  1981    Outpatient Medications Prior to Visit  Medication Sig Dispense Refill   acetaminophen (TYLENOL) 500 MG tablet Take 1,000 mg by mouth every 6 (six) hours as needed for moderate pain.     albuterol (VENTOLIN HFA) 108 (90 Base) MCG/ACT inhaler Inhale 2 puffs into the lungs every 6 (six) hours as needed for wheezing or shortness of breath. 8 g 0   aspirin EC 81 MG tablet Take 81 mg by mouth daily. Swallow whole.     cetirizine (ZYRTEC) 10 MG tablet Take 10 mg by mouth daily.     Colchicine 0.6 MG CAPS Take 0.6 mg by mouth 2 (two) times daily.     diazepam (VALIUM) 5 MG tablet TAKE ONE TABLET BY MOUTH EVERY TWELVE HOURS AS NEEDED FOR MIGRAINE HEADACHES 60 tablet 5   docusate sodium (COLACE) 100 MG capsule Take 1 capsule (100 mg total) by mouth 2 (two) times daily. 10  capsule 0   folic acid (FOLVITE) 1 MG tablet Take 1 mg by mouth at bedtime.     leflunomide (ARAVA) 20 MG tablet Take 20 mg by mouth daily.     lisinopril (ZESTRIL) 20 MG tablet Take 1 tablet (20 mg total) by mouth daily. 90 tablet 3   metFORMIN (GLUCOPHAGE-XR) 500 MG 24 hr tablet Take 2 tablets (1,000 mg total) by mouth 2 (two) times daily with a meal. 360 tablet 3   methotrexate 50 MG/2ML injection Inject 22.5 mg into the muscle every Monday.     metoprolol tartrate (LOPRESSOR) 25 MG tablet Take 0.5 tablets (12.5 mg total) by mouth 2 (two) times daily. 90 tablet 3   phenazopyridine (PYRIDIUM) 95 MG tablet Take 190 mg by mouth 2 (two) times daily as needed for pain.     polyethylene glycol (MIRALAX / GLYCOLAX) 17 g packet Take 17 g by mouth daily as needed for mild constipation. 14 each 0   Polyvinyl Alcohol-Povidone (REFRESH OP) Place 1 drop into both eyes 2 (two) times daily.     predniSONE (DELTASONE) 10 MG tablet 2 tabs po qd x 10d, then 1 tab po qd x 10d 30 tablet 0   predniSONE (DELTASONE) 5 MG tablet Take 1-2 tablets (5-10 mg total) by mouth daily. (Patient taking differently: Take 5 mg by mouth daily with breakfast.) 180 tablet 3   primidone (MYSOLINE) 50 MG tablet Take 50-100 mg by mouth See admin instructions. Take 50 mg by mouth in the morning and 100 mg by mouth in the evening.     promethazine (PHENERGAN) 12.5 MG tablet TAKE 1-2 TABLETS BY MOUTH EVERY SIX hours AS NEEDED FOR nausea 30 tablet 2   rosuvastatin (CRESTOR) 20 MG tablet TAKE ONE TABLET BY MOUTH EVERY DAY 90 tablet 3   sertraline (ZOLOFT) 100 MG tablet Take 1 tablet (100 mg total) by mouth daily. 90 tablet 1   omeprazole (PRILOSEC) 40 MG capsule Take 1 capsule (40 mg total) by mouth 2 (two) times daily. 60 capsule 3   amphetamine-dextroamphetamine (ADDERALL XR) 15 MG 24 hr capsule TAKE ONE CAPSULE BY MOUTH EVERY MORNING 30 capsule 0   No facility-administered medications prior to visit.    Allergies  Allergen Reactions    Steri-Strip Compound Benzoin [Benzoin Compound] Anaphylaxis and Itching    Blisters and itching  Penicillins Rash    Tolerated Cephalosporin 05/26/21.     Pravastatin     Myalgias   Simvastatin     Myalgias   Hydrocodone-Acetaminophen Nausea And Vomiting   Hydroxychloroquine Sulfate Rash    ROS As per HPI  PE:    03/03/2022    3:24 PM 02/17/2022    1:19 PM 12/03/2021   11:23 AM  Vitals with BMI  Height 5' 8.5" 5' 8.5" 5' 8.5"  Weight 137 lbs 6 oz 133 lbs 6 oz 142 lbs 10 oz  BMI 20.59 36.64 40.34  Systolic 742 595 638  Diastolic 68 67 80  Pulse 84 113 92   Physical Exam  Gen: Alert, well appearing.  Patient is oriented to person, place, time, and situation. AFFECT: pleasant, lucid thought and speech. No further exam today.  LABS:  Last CBC Lab Results  Component Value Date   WBC 7.4 02/17/2022   HGB 11.3 (L) 02/17/2022   HCT 34.7 (L) 02/17/2022   MCV 76.5 (L) 02/17/2022   MCH 27.5 05/27/2021   RDW 16.8 (H) 02/17/2022   PLT 327.0 02/17/2022   Lab Results  Component Value Date   IRON 30 (L) 11/02/2021   TIBC 375 11/02/2021   FERRITIN 53 75/64/3329   Last metabolic panel Lab Results  Component Value Date   GLUCOSE 179 (H) 02/17/2022   NA 134 (L) 02/17/2022   K 4.0 02/17/2022   CL 98 02/17/2022   CO2 22 02/17/2022   BUN 14 02/17/2022   CREATININE 0.75 02/17/2022   GFRNONAA >60 05/27/2021   CALCIUM 8.6 02/17/2022   PROT 6.5 02/17/2022   ALBUMIN 3.8 02/17/2022   BILITOT 0.4 02/17/2022   ALKPHOS 79 02/17/2022   AST 16 02/17/2022   ALT 17 02/17/2022   ANIONGAP 7 05/27/2021   Last hemoglobin A1c Lab Results  Component Value Date   HGBA1C 9.2 (H) 02/17/2022   Last thyroid functions Lab Results  Component Value Date   TSH 1.24 02/17/2022   Lab Results  Component Value Date   ESRSEDRATE 67 (H) 02/17/2022   Lab Results  Component Value Date   CKTOTAL 16 02/17/2022   TROPONINI <0.03 10/05/2017    IMPRESSION AND PLAN:  #1 rheumatoid  arthritis flare. Much improved with short-term increase in prednisone.  She will see her endocrinologist tomorrow and they will decide if they want her to return to her baseline of 5 mg daily prednisone dosing or not. She has follow-up with her rheumatologist soon and per patient report will be starting Actemra.  #2 fatigue, poor appetite, low blood pressure. I think she had some relative adrenal insufficiency secondary to her chronic prednisone use. Recent short-term increase prednisone dosing has helped significantly (see #1 above).  #3 hypertension, history of palpitations (PAC and sinus tach). Doing better on Lopressor 12.5 twice daily and one half of lisinopril 20 mg tab daily.  An After Visit Summary was printed and given to the patient.  FOLLOW UP: Return in about 3 months (around 06/03/2022) for routine chronic illness f/u.  Signed:  Crissie Sickles, MD           03/03/2022

## 2022-03-04 ENCOUNTER — Encounter: Payer: Self-pay | Admitting: Internal Medicine

## 2022-03-04 ENCOUNTER — Ambulatory Visit (INDEPENDENT_AMBULATORY_CARE_PROVIDER_SITE_OTHER): Payer: 59 | Admitting: Internal Medicine

## 2022-03-04 VITALS — BP 110/68 | HR 83 | Ht 68.5 in | Wt 138.2 lb

## 2022-03-04 DIAGNOSIS — E2749 Other adrenocortical insufficiency: Secondary | ICD-10-CM | POA: Diagnosis not present

## 2022-03-04 DIAGNOSIS — E785 Hyperlipidemia, unspecified: Secondary | ICD-10-CM | POA: Diagnosis not present

## 2022-03-04 DIAGNOSIS — E041 Nontoxic single thyroid nodule: Secondary | ICD-10-CM

## 2022-03-04 DIAGNOSIS — E1165 Type 2 diabetes mellitus with hyperglycemia: Secondary | ICD-10-CM | POA: Diagnosis not present

## 2022-03-04 MED ORDER — METFORMIN HCL ER 500 MG PO TB24: 1000.0000 mg | ORAL_TABLET | Freq: Two times a day (BID) | ORAL | 3 refills | Status: DC

## 2022-03-04 MED ORDER — PREDNISONE 5 MG PO TABS: 5.0000 mg | ORAL_TABLET | Freq: Every day | ORAL | 3 refills | Status: DC

## 2022-03-04 NOTE — Progress Notes (Signed)
Patient ID: SAMANATHA BRAMMER, female   DOB: 1955-07-15, 66 y.o.   MRN: [id number]  HPI: NYELLIE YETTER is a 66 y.o.-year-old female, returning for f/u for DM2, dx ~1995, insulin-dependent since 11/2013 - now insulin-independent, controlled, without long-term complications. Last visit 10 months ago.  Interim history: When I saw her in 02/2021, in an effort to improve her blood sugars, she was off steroids completely after many years of taking prednisone daily.  She was feeling terrible, with nausea, weakness, decreased appetite, weight loss - "I feel like I'm dying". We restarted prednisone right away.  At last visit, she was feeling much better.  However, she had indigestion and went to the emergency room with dysphagia.  She had benign esophageal stenosis and had 2 strictures dilated at that time.  She had arthritis flare up 5-6 mo ago >> she was in a lot of pain and stiffness and could not really bend her fingers. She could not eat but drink fluids.  At that time, she  stopped taking her meds including Prednisone >> she started to feel much worse >> she finally saw her PCP at the beginning of this month, and he increased to dose of Prednisone to 20 mg daily >> now 10 mg daily starting yesterday. She will see rheumatology in 2 weeks >> will try to start Actemra. Sugars have been higher, in the 300s >> now back down to low 100s.   Reviewed HbA1c levels: Lab Results  Component Value Date   HGBA1C 9.2 (H) 02/17/2022   HGBA1C 5.6 04/30/2021   HGBA1C 7.7 (A) 02/27/2021   She has RA >> was on Simponi (changed from Remicade) and MTX >> started Orencia and stays on MTX. On Plaquenil.  On prednisone 5 mg daily.  Pt is on a regimen of: - Metformin ER 500 mg >> [phone] mg 2x a day - Ozempic 0.5 mg weekly - started 12/2018>> indigestion and decreased appetite >> stopped 04/2021 Previously on Levemir 14 >> 10 >> 4-6 units at bedtime >> stopped 07/2019 She tried Glimepiride in the past >> fluctuating  blood sugar. We stopped glipizide and Januvia only started Ozempic 12/2018. She had a CGM in the past but could not afford it anymore.  She checks sugars 2-3x a day per review of her log: - am:  101-138 >> 122-172 >> 105-144 (mostly 120s) >> 232, then 125-189 - 2h after b'fast: 120-140 >> n/c >> 143, 160 >> 140s >> 247 - before lunch: n/c >> 110-120 >> n/c - 2h after lunch: n/c >> 140-154 >> n/c >> 153 >> 140s >> 249 - before dinner: 130-150 >> 150 >> n/c >> 124, 151 >> 303 on 20 mg Prednisone - 2h after dinner: 166, 186, 220 (steroids) >> 140s, 188 (icecream) >> n/c - bedtime: 110-120 >> n/c   - nighttime: n/c >> 100-150 >> n/c >> 151 Lowest sugar was 52 (x2 -Glipizide) ...>> 122 >> 105 >> 125;  she has hypoglycemia awareness in the 70s. Highest sugar was 324 ...>> 220 >> 188 >> 303  Meter: ReliOn.  -No CKD, last BUN/creatinine:  Lab Results  Component Value Date   BUN 14 02/17/2022   CREATININE 0.75 02/17/2022  On lisinopril 10.  -+ HL; last set of lipids: Lab Results  Component Value Date   CHOL 108 03/31/2021   HDL 32.20 (L) 03/31/2021   LDLCALC 65 09/22/2020   LDLDIRECT 47.0 03/31/2021   TRIG 209.0 (H) 03/31/2021   CHOLHDL 3 03/31/2021  She was taken  off statins in the past due to transaminitis in 10/2013.  Atorvastatin caused muscle cramps.  Currently on Crestor 10 and fish oil.  - last eye exam 04/2021: No DR reportedly.  She had cataract sx's 05/2018. Dr. Charise Killian.  - Denies numbness and tingling in her feet.  Started on Omeprazole for choking >> resolved.  She had an esophageal dilation 03/2020. She has steroid injections in knees - had 1 TKR and will need to have another one. She was dx'ed with PSVT after a syncopal episode at church at the beginning of 2022. On Metoprolol now.  Since last visit, she has a CT neck and soft tissue (03/13/2021) showing a 1 cm right thyroid nodule with calcification.  Thyroid U/S (05/11/2021): Parenchymal Echotexture: Mildly  heterogenous  Isthmus: 0.4 cm  Right lobe: 4.6 x 1.9 x 1.7 cm  Left lobe: 3.0 x 1.0 x 1.3 cm  _____________________________________________________________________   Nodule # 1:  Location: Isthmus  Maximum size: 0.5 cm; Other 2 dimensions: 0.4 x 0.3 cm  Composition: solid/almost completely solid (2)  Echogenicity: hypoechoic (2)    Given size (<0.9 cm) and appearance, this nodule does NOT meet TI-RADS criteria for biopsy or dedicated follow-up.  ________________________________________________________   Nodule # 2:  Location: Right; Mid  Maximum size: 1.5 cm; Other 2 dimensions: 1.3 x 1.4 cm  Composition: solid/almost completely solid (2)  Echogenicity: hyperechoic (1)  Echogenic foci: macrocalcifications (1)    **Given size (>/= 1.5 cm) and appearance, fine needle aspiration of this moderately suspicious nodule should be considered based on TI-RADS criteria.   Nodule # 3:  Location: Left; Mid  Maximum size: 0.5 cm; Other 2 dimensions: 0.4 x 0.4 cm  Composition: solid/almost completely solid (2)  Echogenicity: hypoechoic (2)   Given size (<0.9 cm) and appearance, this nodule does NOT meet TI-RADS criteria for biopsy or dedicated follow-up.  _________________________________________________________   Small nonenlarged bilateral cervical lymph nodes demonstrate normal lymph node architecture.   IMPRESSION: 1. 1.5 cm right mid thyroid nodule (nodule 2) just meets criteria for fine-needle aspiration. 2. 0.5 cm isthmus and 0.5 cm left thyroid nodules do not meet criteria for biopsy or dedicated follow-up.   I recommended a biopsy but she did not see the MyChart msg and did not have the biopsy yet.  Pt denies: - feeling nodules in neck - hoarseness - dysphagia after Es dilation - choking  + FH of thyroid nodule in mother >> benign. No FH of ThyCA.  ROS: + See HPI + Tremors, + joint pain  I reviewed pt's medications, allergies, PMH, social hx, family hx, and  changes were documented in the history of present illness. Otherwise, unchanged from my initial visit note.  Past Medical History:  Diagnosis Date   Acute medial meniscus tear of right knee    Adrenal insufficiency (Addison's disease) (HCC) 02/2021   Anemia of chronic disease    Mild, Hb stable consistently   Anxiety and depression    Chest pain 10/2017   +Cardiac CT.  Myocardial perfusion imaging NORMAL, EF>65%   Collagen vascular disease (HCC)    Diabetes mellitus 1995   Managed by Dr. Elvera Lennox (Endo)   GERD (gastroesophageal reflux disease) per pt watches diet and take tums as needed   with hx of prox esoph stricture + dilation procedure   H/O hiatal hernia    slightly larger ("moderate" size) on CT angio done to r/o PE 10/2017. noted on egd again 03/2021   History of adrenal insufficiency  in 2022 when she tried coming off her chronic daily prednisone for her RA   Hyperlipidemia    Hypertension    Interstitial cystitis    Lichen sclerosus of female genitalia    Migraine    Osteoarthritis, multiple sites    Palpitations 10/2017   3 wk event monitor: symptoms correlate with sinus rhythm with PACs, at times runs of PACs up to 5 beats.   Pseudogout of knee, left    colchicine helpful   Psoriasis    Seasonal allergies    Seropositive rheumatoid arthritis (HCC)    Hands, knees, jaw, elbow: responding to Simponi as of 09/29/15 rheum f/u.  Waning effectiveness on 03/2016 f/u so pt switched to Orencia injections.  Doing well on chronic low-dose prednisone therapy+ methotrexate +orencia as of 01/2019, 08/2019, 11/2019    Past Surgical History:  Procedure Laterality Date   ABDOMINAL HYSTERECTOMY  1991   for fibroids; ovaries still in   BIOPSY  03/17/2021   Procedure: BIOPSY;  Surgeon: Kathi Der, MD;  Location: [facility];  Service: Gastroenterology;;   BREAST BIOPSY  2001; 02/12/16   fibrocystic breast disease in 2001 and again in 02/2016 (+scar from prev bx site  w/calcifications).   CARDIOVASCULAR STRESS TEST  11/01/2017   Myocard perf imaging: NORMAL (EF >60-65%).  12/30/21 normal   COLONOSCOPY  09/12/2013; 02/25/20   Normal 2015 and 2021: Recall 5 yrs ([facility], Dr. Evette Cristal) due to FH of colon polyps.   CYSTOSCOPY  2004   for chronic UTI   DEXA  10/2016   Bone density normal (T-score 0.0)   ESOPHAGOGASTRODUODENOSCOPY  04/19/2011   Nl except antral gastritis: bx = mild chron gastritis (H pyloria neg)    ESOPHAGOGASTRODUODENOSCOPY (EGD) WITH PROPOFOL N/A 03/27/2020   Esoph stenosis-->dilated.  Mod size hiatal hernia (Dig Hea Spec). Procedure: ESOPHAGOGASTRODUODENOSCOPY (EGD) WITH PROPOFOL ;  Surgeon: Graylin Shiver, MD;  Location: [facility];  Service: Endoscopy;  Laterality: N/A;   ESOPHAGOGASTRODUODENOSCOPY (EGD) WITH PROPOFOL N/A 03/17/2021   Esoph stricture->dilated.  Large hiatal hernia. Also +esoph candidiasis, bx inflammation.  Procedure: ESOPHAGOGASTRODUODENOSCOPY (EGD) WITH PROPOFOL;  Surgeon: Kathi Der, MD;  Location: [facility];  Service: Gastroenterology;  Laterality: N/A;   EVENT MONITOR  10/2017   3 wk-->Symptoms correlate with sinus rhythm with PACs, at times runs of PACs up to 5 beats.   GALLBLADDER SURGERY  2019   intraarticular steroid injection  2013   3 R knee & L X 1; Dr Netta Corrigan   KNEE ARTHROCENTESIS  2013   R knee x 3 & L X 1   KNEE ARTHROSCOPY  10/21/2011   Procedure: ARTHROSCOPY KNEE;  Surgeon: Jacki Cones, MD;  Location: [facility];  Service: Orthopedics;  Laterality: Right;  WITH MEDIAL and lateral shaving of femoral chondyl  with microfracture technique of lateral and medial femoral chondyl suprapatellar synovectomy   LAPAROSCOPIC CHOLECYSTECTOMY  01/2018   myocardial perf imaging     Normal 12/2021   SAVORY DILATION N/A 03/27/2020   Procedure: SAVORY DILATION;  Surgeon: Graylin Shiver, MD;  Location: [facility];  Service: Endoscopy;  Laterality: N/A;   TOTAL KNEE ARTHROPLASTY  04/12/2012    RIGHT  Procedure: TOTAL KNEE ARTHROPLASTY;  Surgeon: Jacki Cones, MD;  Location: [facility];  Service: Orthopedics;  Laterality: Right;  Right Total Knee Arthroplasty   TOTAL KNEE ARTHROPLASTY Left 05/26/2021   Procedure: TOTAL KNEE ARTHROPLASTY;  Surgeon: Durene Romans, MD;  Location: [facility];  Service: Orthopedics;  Laterality:  Left;   TRANSTHORACIC ECHOCARDIOGRAM  11/13/2021   EF 65-70%, mild dec RV fxn, grd I DD, o/w normal   TUBAL LIGATION  1981    Social History   Socioeconomic History   Marital status: Married    Spouse name: Not on file   Number of children: Not on file   Years of education: Not on file   Highest education level: Not on file  Occupational History   Not on file  Tobacco Use   Smoking status: Never   Smokeless tobacco: Never  Vaping Use   Vaping Use: Never used  Substance and Sexual Activity   Alcohol use: No   Drug use: No   Sexual activity: Not on file  Other Topics Concern   Not on file  Social History Narrative   Married, 3 children (2 in [place], 1 in [place]).  4 grandchildren.   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)   No tob/alc/drugs.   2 cups of coffee/day x 3 months   Regular exercise- no-due to arthritis.   Religion affecting care, "it allows stress management:"   Social Determinants of Health   Financial Resource Strain: Not on file  Food Insecurity: Not on file  Transportation Needs: Not on file  Physical Activity: Not on file  Stress: Not on file  Social Connections: Not on file  Intimate Partner Violence: Not on file    Current Outpatient Medications on File Prior to Visit  Medication Sig Dispense Refill   acetaminophen (TYLENOL) 500 MG tablet Take 1,000 mg by mouth every 6 (six) hours as needed for moderate pain.     albuterol (VENTOLIN HFA) 108 (90 Base) MCG/ACT inhaler Inhale 2 puffs into the lungs every 6 (six) hours as needed for wheezing or shortness of breath. 8 g 0   amphetamine-dextroamphetamine (ADDERALL  XR) 15 MG 24 hr capsule TAKE ONE CAPSULE BY MOUTH EVERY MORNING 30 capsule 0   aspirin EC 81 MG tablet Take 81 mg by mouth daily. Swallow whole.     cetirizine (ZYRTEC) 10 MG tablet Take 10 mg by mouth daily.     Colchicine 0.6 MG CAPS Take 0.6 mg by mouth 2 (two) times daily.     diazepam (VALIUM) 5 MG tablet TAKE ONE TABLET BY MOUTH EVERY TWELVE HOURS AS NEEDED FOR MIGRAINE HEADACHES 60 tablet 5   docusate sodium (COLACE) 100 MG capsule Take 1 capsule (100 mg total) by mouth 2 (two) times daily. 10 capsule 0   folic acid (FOLVITE) 1 MG tablet Take 1 mg by mouth at bedtime.     leflunomide (ARAVA) 20 MG tablet Take 20 mg by mouth daily.     lisinopril (ZESTRIL) 20 MG tablet 1/2 tab po qd 45 tablet 3   metFORMIN (GLUCOPHAGE-XR) 500 MG 24 hr tablet Take 2 tablets (1,000 mg total) by mouth 2 (two) times daily with a meal. 360 tablet 3   methotrexate 50 MG/2ML injection Inject 22.5 mg into the muscle every Monday.     metoprolol tartrate (LOPRESSOR) 25 MG tablet Take 0.5 tablets (12.5 mg total) by mouth 2 (two) times daily. 90 tablet 3   omeprazole (PRILOSEC) 40 MG capsule Take 1 capsule (40 mg total) by mouth 2 (two) times daily. 60 capsule 3   phenazopyridine (PYRIDIUM) 95 MG tablet Take 190 mg by mouth 2 (two) times daily as needed for pain.     polyethylene glycol (MIRALAX / GLYCOLAX) 17 g packet Take 17 g by mouth daily as needed for mild constipation.  14 each 0   Polyvinyl Alcohol-Povidone (REFRESH OP) Place 1 drop into both eyes 2 (two) times daily.     predniSONE (DELTASONE) 10 MG tablet 2 tabs po qd x 10d, then 1 tab po qd x 10d 30 tablet 0   predniSONE (DELTASONE) 5 MG tablet Take 1-2 tablets (5-10 mg total) by mouth daily. (Patient taking differently: Take 5 mg by mouth daily with breakfast.) 180 tablet 3   primidone (MYSOLINE) 50 MG tablet Take 50-100 mg by mouth See admin instructions. Take 50 mg by mouth in the morning and 100 mg by mouth in the evening.     promethazine (PHENERGAN)  12.5 MG tablet TAKE 1-2 TABLETS BY MOUTH EVERY SIX hours AS NEEDED FOR nausea 30 tablet 2   rosuvastatin (CRESTOR) 20 MG tablet TAKE ONE TABLET BY MOUTH EVERY DAY 90 tablet 3   sertraline (ZOLOFT) 100 MG tablet Take 1 tablet (100 mg total) by mouth daily. 90 tablet 1   No current facility-administered medications on file prior to visit.    Allergies  Allergen Reactions   Steri-Strip Compound Benzoin [Benzoin Compound] Anaphylaxis and Itching    Blisters and itching   Penicillins Rash    Tolerated Cephalosporin 05/26/21.     Pravastatin     Myalgias   Simvastatin     Myalgias   Hydrocodone-Acetaminophen Nausea And Vomiting   Hydroxychloroquine Sulfate Rash    Family History  Problem Relation Age of Onset   Diabetes Mother    Stroke Mother 63   Osteoarthritis Mother    Stroke Brother 56   Heart disease Father        CABG   Lung cancer Father    Prostate cancer Father    Pancreatic cancer Father    Pancreatic cancer Paternal Grandfather    Bipolar disorder Daughter    Anxiety disorder Son    Arthritis Maternal Grandmother        rheumatoid   Heart attack Brother 80       smoker   PE: BP 110/68 (BP Location: Left Arm, Patient Position: Sitting, Cuff Size: Normal)   Pulse 83   Ht 5' 8.5" (1.74 m)   Wt 138 lb 3.2 oz (62.7 kg)   SpO2 97%   BMI 20.71 kg/m  There is no height or weight on file to calculate BMI.  Wt Readings from Last 3 Encounters:  03/04/22 138 lb 3.2 oz (62.7 kg)  03/03/22 137 lb 6.4 oz (62.3 kg)  02/17/22 133 lb 6.4 oz (60.5 kg)   Constitutional: normal weight, in NAD Eyes: no exophthalmos ENT: moist mucous membranes, no masses palpated in neck, no cervical lymphadenopathy Cardiovascular: RRR, No MRG Respiratory: CTA B Musculoskeletal: no deformities Skin: moist, warm, no rashes Neurological: + tremor with outstretched hands Diabetic Foot Exam - Simple   Simple Foot Form Diabetic Foot exam was performed with the following findings: Yes  03/04/2022  3:55 PM  Visual Inspection No deformities, no ulcerations, no other skin breakdown bilaterally: Yes Sensation Testing Intact to touch and monofilament testing bilaterally: Yes Pulse Check Posterior Tibialis and Dorsalis pulse intact bilaterally: Yes Comments    ASSESSMENT: 1. DM2, insulin-independent, uncontrolled, without long term complications, but with Hgly -She was on the freestyle libre CGM in the past but cannot afford it anymore.  2. HL  3.  Central adrenal insufficiency  4.  Right thyroid nodule  PLAN:  1. Patient with longstanding, uncontrolled, type 2 diabetes, with improved control after adding the GLP-1 receptor agonist  to her metformin ER regimen.  We could not use a high dose of Ozempic due to diarrhea.  Her sugars improved significantly after this so she could come off insulin.  At last visit, sugars are almost all at goal with a low blood sugars but she had indigestion and decreased appetite.  Her HbA1c was very well controlled, at 5.6%, so we stopped Ozempic.  She now returns after a longer absence of 10 months. -At this visit, she returns after having had a recent HbA1c was much higher, at 9.2% -At today's visit, she returns after a difficult period of time, and which she had significant pain and ended up stopping all of her medicines including metformin and prednisone.  Her sugars increased.  When she saw her PCP 02/2022, she was started on high-dose prednisone.  When she checked her blood sugars afterwards, they were in the 300s.  She decreased the dose of prednisone to 10 mg yesterday and this morning sugars were already much better.  She feels that if she continues with metformin as prescribed, sugars would remain controlled.  For now, we can do so, but I advised her to do blood sugars twice a day and write them down and send them to me in 1 to 2 weeks. - I suggested to:  Patient Instructions  Please continue: - Metformin ER 1000 mg 2x daily  Please  check blood sugars 2x a day and write them down - send these to me after 1-2 weeks.  Regarding Prednisone: - You absolutely need to take this medication every day and not skip doses. - Please double the dose if you have a fever, for the duration of the fever. - If you cannot take anything by mouth (vomiting) or you have severe diarrhea so that you eliminate the prednisone pills in your stool, please make sure that you get steroids in the vein instead - go to the nearest emergency department/urgent care or you may go to your PCPs office  - Please try to get a MedAlert bracelet or pendant indicating: "Adrenal insufficiency".   I ordered your thyroid biopsy.  Please come back for a follow-up appointment in 3-4 months.  - advised to check sugars at different times of the day - 1x a day, rotating check times - advised for yearly eye exams >> she is UTD - return to clinic in 3-4 months   2. HL -Reviewed latest lipid panel from 03/2021: LDL at goal, triglycerides high, HDL low: Lab Results  Component Value Date   CHOL 108 03/31/2021   HDL 32.20 (L) 03/31/2021   LDLCALC 65 09/22/2020   LDLDIRECT 47.0 03/31/2021   TRIG 209.0 (H) 03/31/2021   CHOLHDL 3 03/31/2021  -Continues Crestor 10 mg daily and fish oil, without side effects  3. Central adrenal insufficiency  -Iatrogenic, due to steroid treatment for RA -Patient has had RA for many years, previously on 5 mg of prednisone daily and also intermittent prednisone tapers, but she stopped prednisone completely before last visit in an effort to manage her diabetes.  She started to feel very poorly, with weight loss, decreased appetite, weakness, and overall, feeling terrible.  We restarted prednisone right away. -At today's visit, she again returns after period of time when she missed her prednisone.  We again discussed about the fact that this is extremely dangerous.  She absolutely cannot miss prednisone doses.  Fortunately, she saw her PCP at  the beginning of the month and she was started on a higher dose  of prednisone, now tapering it down. -At last visit and again today I emphasized sick day rules to manage adrenal insufficiency -I refilled her prednisone 5 mg prescription.  4.  Right thyroid nodule -Incidentally found on CT of the neck and soft tissues (03/13/2021): 1 cm right thyroid nodule with calcification. -We checked a thyroid ultrasound after last visit (05/11/2021): Right mid 1.5 x 1.3 x 1.4 cm nodule appears to be solid, hypoechoic with macrocalcifications and the recommendation was made for biopsy.  Other nodules were subcm and not worrisome.  -No neck impression symptoms -After the ultrasound results returned, I suggested a biopsy.  She did not have this done yet as she did not see the MyChart message.  I will reorder this. -Latest TSH was normal: Lab Results  Component Value Date   TSH 1.24 02/17/2022    Carlus Pavlov, MD PhD [facility]

## 2022-03-04 NOTE — Patient Instructions (Addendum)
Please continue: - Metformin ER 1000 mg 2x daily  Please check blood sugars 2x a day and write them down - send these to me after 1-2 weeks.  Regarding Prednisone: - You absolutely need to take this medication every day and not skip doses. - Please double the dose if you have a fever, for the duration of the fever. - If you cannot take anything by mouth (vomiting) or you have severe diarrhea so that you eliminate the prednisone pills in your stool, please make sure that you get steroids in the vein instead - go to the nearest emergency department/urgent care or you may go to your PCPs office  - Please try to get a MedAlert bracelet or pendant indicating: "Adrenal insufficiency".   I ordered your thyroid biopsy.  Please come back for a follow-up appointment in 3-4 months.

## 2022-03-30 ENCOUNTER — Telehealth: Payer: Self-pay | Admitting: Family Medicine

## 2022-03-30 NOTE — Telephone Encounter (Signed)
Left message for patient to schedule Annual Wellness Visit.  Please schedule (telephone/video call) with Nurse Health Advisor Tina Betterson, RN at [facility]. Please call [phone] ask for Kathy 

## 2022-04-01 ENCOUNTER — Ambulatory Visit
Admission: RE | Admit: 2022-04-01 | Discharge: 2022-04-01 | Disposition: A | Discharge status: Home / Self Care | Payer: Medicare (Managed Care) | Source: Ambulatory Visit | Attending: Internal Medicine | Admitting: Internal Medicine

## 2022-04-01 ENCOUNTER — Other Ambulatory Visit (HOSPITAL_COMMUNITY)
Admission: RE | Admit: 2022-04-01 | Discharge: 2022-04-01 | Disposition: A | Discharge status: Home / Self Care | Payer: Medicare (Managed Care) | Source: Ambulatory Visit | Attending: Internal Medicine | Admitting: Internal Medicine

## 2022-04-01 DIAGNOSIS — E041 Nontoxic single thyroid nodule: Secondary | ICD-10-CM | POA: Diagnosis not present

## 2022-04-01 DIAGNOSIS — E0789 Other specified disorders of thyroid: Secondary | ICD-10-CM | POA: Diagnosis not present

## 2022-04-06 ENCOUNTER — Encounter: Payer: Self-pay | Admitting: Internal Medicine

## 2022-04-06 ENCOUNTER — Other Ambulatory Visit: Payer: Self-pay | Admitting: Internal Medicine

## 2022-04-06 ENCOUNTER — Other Ambulatory Visit: Payer: Self-pay | Admitting: Family Medicine

## 2022-04-06 DIAGNOSIS — I10 Essential (primary) hypertension: Secondary | ICD-10-CM

## 2022-04-06 LAB — CYTOLOGY - NON PAP

## 2022-04-06 MED ORDER — LANTUS SOLOSTAR 100 UNIT/ML ~~LOC~~ SOPN: 10.0000 [IU] | PEN_INJECTOR | Freq: Every day | SUBCUTANEOUS | 3 refills | Status: DC

## 2022-04-06 MED ORDER — INSULIN PEN NEEDLE 32G X 4 MM MISC: 3 refills | Status: DC

## 2022-04-10 DIAGNOSIS — E041 Nontoxic single thyroid nodule: Secondary | ICD-10-CM | POA: Diagnosis not present

## 2022-04-14 ENCOUNTER — Other Ambulatory Visit: Payer: Self-pay | Admitting: Internal Medicine

## 2022-04-14 ENCOUNTER — Encounter: Payer: Self-pay | Admitting: Internal Medicine

## 2022-04-14 MED ORDER — GLIPIZIDE 5 MG PO TABS: 5.0000 mg | ORAL_TABLET | Freq: Two times a day (BID) | ORAL | 3 refills | Status: DC

## 2022-04-27 ENCOUNTER — Encounter (HOSPITAL_COMMUNITY): Payer: Self-pay

## 2022-06-14 ENCOUNTER — Encounter: Payer: Self-pay | Admitting: Family Medicine

## 2022-06-14 ENCOUNTER — Ambulatory Visit (INDEPENDENT_AMBULATORY_CARE_PROVIDER_SITE_OTHER): Payer: Medicare (Managed Care) | Admitting: Family Medicine

## 2022-06-14 VITALS — BP 98/64 | HR 80 | Temp 98.0°F | Ht 68.5 in | Wt 144.2 lb

## 2022-06-14 DIAGNOSIS — I10 Essential (primary) hypertension: Secondary | ICD-10-CM | POA: Diagnosis not present

## 2022-06-14 DIAGNOSIS — E7800 Pure hypercholesterolemia, unspecified: Secondary | ICD-10-CM | POA: Diagnosis not present

## 2022-06-14 DIAGNOSIS — F988 Other specified behavioral and emotional disorders with onset usually occurring in childhood and adolescence: Secondary | ICD-10-CM | POA: Diagnosis not present

## 2022-06-14 DIAGNOSIS — I952 Hypotension due to drugs: Secondary | ICD-10-CM

## 2022-06-14 DIAGNOSIS — Z79899 Other long term (current) drug therapy: Secondary | ICD-10-CM

## 2022-06-14 LAB — LIPID PANEL
Cholesterol: 146 mg/dL (ref 0–200)
HDL: 51.8 mg/dL (ref >39.00)
LDL Cholesterol: 66 mg/dL (ref 0–99)
NonHDL: 94.64
Total CHOL/HDL Ratio: 3
Triglycerides: 144 mg/dL (ref 0.0–149.0)
VLDL: 28.8 mg/dL (ref 0.0–40.0)

## 2022-06-14 LAB — BASIC METABOLIC PANEL
BUN: 16 mg/dL (ref 6–23)
CO2: 24 mEq/L (ref 19–32)
Calcium: 8.4 mg/dL (ref 8.4–10.5)
Chloride: 103 mEq/L (ref 96–112)
Creatinine, Ser: 0.62 mg/dL (ref 0.40–1.20)
GFR: 92.44 mL/min (ref >60.00)
Glucose, Bld: 66 mg/dL — ABNORMAL LOW (ref 70–99)
Potassium: 4.5 mEq/L (ref 3.5–5.1)
Sodium: 137 mEq/L (ref 135–145)

## 2022-06-14 MED ORDER — AMPHETAMINE-DEXTROAMPHET ER 15 MG PO CP24: ORAL_CAPSULE | ORAL | 0 refills | Status: DC

## 2022-06-14 MED ORDER — PREDNISONE 5 MG PO TABS: 5.0000 mg | ORAL_TABLET | Freq: Every day | ORAL | 3 refills | Status: DC

## 2022-06-14 MED ORDER — OMEPRAZOLE 40 MG PO CPDR: 40.0000 mg | DELAYED_RELEASE_CAPSULE | Freq: Two times a day (BID) | ORAL | 3 refills | Status: DC

## 2022-06-14 MED ORDER — DIAZEPAM 5 MG PO TABS: ORAL_TABLET | ORAL | 5 refills | Status: DC

## 2022-06-14 MED ORDER — LISINOPRIL 20 MG PO TABS: ORAL_TABLET | ORAL | 3 refills | Status: DC

## 2022-06-14 MED ORDER — SERTRALINE HCL 100 MG PO TABS: 100.0000 mg | ORAL_TABLET | Freq: Every day | ORAL | 1 refills | Status: DC

## 2022-06-14 NOTE — Progress Notes (Signed)
OFFICE VISIT  06/14/2022  CC:  Chief Complaint  Patient presents with   Hypertension   ADD    Patient is a 66 y.o. female who presents for 48-month follow-up hypertension, HLD, and adult ADD. A/P as of last visit: "#1 rheumatoid arthritis flare. Much improved with short-term increase in prednisone.  She will see her endocrinologist tomorrow and they will decide if they want her to return to her baseline of 5 mg daily prednisone dosing or not. She has follow-up with her rheumatologist soon and per patient report will be starting Actemra.   #2 fatigue, poor appetite, low blood pressure. I think she had some relative adrenal insufficiency secondary to her chronic prednisone use. Recent short-term increase prednisone dosing has helped significantly (see #1 above).   #3 hypertension, history of palpitations (PAC and sinus tach). Doing better on Lopressor 12.5 twice daily and one half of lisinopril 20 mg tab daily."  INTERIM HX: Blood pressures had been elevated in the 150s over 90s at home so she increased her lisinopril to a whole 20 mg tab daily about a month ago. Her blood pressures then came down to the 120s over 70s range.  The last couple of mornings she has felt weak and lightheaded.  She has an endocrinologist who follows her diabetes and a rheumatologist for her RA. She is now on some Lantus and was just recently started on a new biologic--Actemra. She is also on 10 mg of prednisone daily.  Recently diagnosed with bilateral carpal tunnel syndrome.  Steroid injection on the left helped significantly.   PMP AWARE reviewed today: most recent rx for Adderall was filled 05/06/2022, #30, rx by me. No red flags.   Past Medical History:  Diagnosis Date   Acute medial meniscus tear of right knee    Adrenal insufficiency (Addison's disease) (HCC) 02/2021   Anemia of chronic disease    Mild, Hb stable consistently   Anxiety and depression    Chest pain 10/2017   +Cardiac CT.   Myocardial perfusion imaging NORMAL, EF>65%   Collagen vascular disease (HCC)    Diabetes mellitus 1995   Managed by Dr. Elvera Lennox (Endo)   GERD (gastroesophageal reflux disease) per pt watches diet and take tums as needed   with hx of prox esoph stricture + dilation procedure   H/O hiatal hernia    slightly larger ("moderate" size) on CT angio done to r/o PE 10/2017. noted on egd again 03/2021   History of adrenal insufficiency    in 2022 when she tried coming off her chronic daily prednisone for her RA   Hyperlipidemia    Hypertension    Interstitial cystitis    Lichen sclerosus of female genitalia    Migraine    Osteoarthritis, multiple sites    Palpitations 10/2017   3 wk event monitor: symptoms correlate with sinus rhythm with PACs, at times runs of PACs up to 5 beats.   Pseudogout of knee, left    colchicine helpful   Psoriasis    Seasonal allergies    Seropositive rheumatoid arthritis (HCC)    Hands, knees, jaw, elbow: responding to Simponi as of 09/29/15 rheum f/u.  Waning effectiveness on 03/2016 f/u so pt switched to Orencia injections.  Doing well on chronic low-dose prednisone therapy+ methotrexate +orencia as of 01/2019, 08/2019, 11/2019    Past Surgical History:  Procedure Laterality Date   ABDOMINAL HYSTERECTOMY  1991   for fibroids; ovaries still in   BIOPSY  03/17/2021   Procedure: BIOPSY;  Surgeon: Kathi Der, MD;  Location: [facility];  Service: Gastroenterology;;   BREAST BIOPSY  2001; 02/12/16   fibrocystic breast disease in 2001 and again in 02/2016 (+scar from prev bx site w/calcifications).   CARDIOVASCULAR STRESS TEST  11/01/2017   Myocard perf imaging: NORMAL (EF >60-65%).  12/30/21 normal   COLONOSCOPY  09/12/2013; 02/25/20   Normal 2015 and 2021: Recall 5 yrs ([facility], Dr. Evette Cristal) due to FH of colon polyps.   CYSTOSCOPY  2004   for chronic UTI   DEXA  10/2016   Bone density normal (T-score 0.0)   ESOPHAGOGASTRODUODENOSCOPY  04/19/2011   Nl except  antral gastritis: bx = mild chron gastritis (H pyloria neg)    ESOPHAGOGASTRODUODENOSCOPY (EGD) WITH PROPOFOL N/A 03/27/2020   Esoph stenosis-->dilated.  Mod size hiatal hernia ([facility]). Procedure: ESOPHAGOGASTRODUODENOSCOPY (EGD) WITH PROPOFOL ;  Surgeon: Graylin Shiver, MD;  Location: [facility];  Service: Endoscopy;  Laterality: N/A;   ESOPHAGOGASTRODUODENOSCOPY (EGD) WITH PROPOFOL N/A 03/17/2021   Esoph stricture->dilated.  Large hiatal hernia. Also +esoph candidiasis, bx inflammation.  Procedure: ESOPHAGOGASTRODUODENOSCOPY (EGD) WITH PROPOFOL;  Surgeon: Kathi Der, MD;  Location: [facility];  Service: Gastroenterology;  Laterality: N/A;   EVENT MONITOR  10/2017   3 wk-->Symptoms correlate with sinus rhythm with PACs, at times runs of PACs up to 5 beats.   GALLBLADDER SURGERY  2019   intraarticular steroid injection  2013   3 R knee & L X 1; Dr Netta Corrigan   KNEE ARTHROCENTESIS  2013   R knee x 3 & L X 1   KNEE ARTHROSCOPY  10/21/2011   Procedure: ARTHROSCOPY KNEE;  Surgeon: Jacki Cones, MD;  Location: [facility];  Service: Orthopedics;  Laterality: Right;  WITH MEDIAL and lateral shaving of femoral chondyl  with microfracture technique of lateral and medial femoral chondyl suprapatellar synovectomy   LAPAROSCOPIC CHOLECYSTECTOMY  01/2018   myocardial perf imaging     Normal 12/2021   SAVORY DILATION N/A 03/27/2020   Procedure: SAVORY DILATION;  Surgeon: Graylin Shiver, MD;  Location: [facility];  Service: Endoscopy;  Laterality: N/A;   TOTAL KNEE ARTHROPLASTY  04/12/2012   RIGHT  Procedure: TOTAL KNEE ARTHROPLASTY;  Surgeon: Jacki Cones, MD;  Location: [facility];  Service: Orthopedics;  Laterality: Right;  Right Total Knee Arthroplasty   TOTAL KNEE ARTHROPLASTY Left 05/26/2021   Procedure: TOTAL KNEE ARTHROPLASTY;  Surgeon: Durene Romans, MD;  Location: [facility];  Service: Orthopedics;  Laterality: Left;   TRANSTHORACIC ECHOCARDIOGRAM  11/13/2021   EF  65-70%, mild dec RV fxn, grd I DD, o/w normal   TUBAL LIGATION  1981    Outpatient Medications Prior to Visit  Medication Sig Dispense Refill   acetaminophen (TYLENOL) 500 MG tablet Take 1,000 mg by mouth every 6 (six) hours as needed for moderate pain.     amphetamine-dextroamphetamine (ADDERALL XR) 15 MG 24 hr capsule TAKE ONE CAPSULE BY MOUTH EVERY MORNING 30 capsule 0   aspirin EC 81 MG tablet Take 81 mg by mouth daily. Swallow whole.     cetirizine (ZYRTEC) 10 MG tablet Take 10 mg by mouth daily.     Colchicine 0.6 MG CAPS Take 0.6 mg by mouth 2 (two) times daily.     CYANOCOBALAMIN PO Take 1 tablet by mouth daily.     diazepam (VALIUM) 5 MG tablet TAKE ONE TABLET BY MOUTH EVERY TWELVE HOURS AS NEEDED FOR MIGRAINE HEADACHES 60 tablet 5   docusate sodium (COLACE) 100  MG capsule Take 1 capsule (100 mg total) by mouth 2 (two) times daily. 10 capsule 0   Ferrous Sulfate (IRON PO) Take 1 tablet by mouth daily.     FOLIC ACID PO Take 2 mg by mouth at bedtime.     glipiZIDE (GLUCOTROL) 5 MG tablet Take 1 tablet (5 mg total) by mouth 2 (two) times daily before a meal. 180 tablet 3   insulin glargine (LANTUS SOLOSTAR) 100 UNIT/ML Solostar Pen Inject 10 Units into the skin at bedtime. 15 mL 3   Insulin Pen Needle 32G X 4 MM MISC Use 1x a day 100 each 3   leflunomide (ARAVA) 20 MG tablet Take 20 mg by mouth daily.     lisinopril (ZESTRIL) 20 MG tablet TAKE ONE-HALF TABLET BY MOUTH EVERY DAY 45 tablet 0   metFORMIN (GLUCOPHAGE-XR) 500 MG 24 hr tablet Take 2 tablets (1,000 mg total) by mouth 2 (two) times daily with a meal. 360 tablet 3   methotrexate 50 MG/2ML injection Inject 22.5 mg into the muscle every Monday.     metoprolol tartrate (LOPRESSOR) 25 MG tablet Take 0.5 tablets (12.5 mg total) by mouth 2 (two) times daily. 90 tablet 3   omeprazole (PRILOSEC) 40 MG capsule Take 1 capsule (40 mg total) by mouth 2 (two) times daily. 60 capsule 3   phenazopyridine (PYRIDIUM) 95 MG tablet Take 190 mg  by mouth 2 (two) times daily as needed for pain.     Polyvinyl Alcohol-Povidone (REFRESH OP) Place 1 drop into both eyes 2 (two) times daily.     primidone (MYSOLINE) 50 MG tablet Take 50-100 mg by mouth See admin instructions. Take 50 mg by mouth in the morning and 100 mg by mouth in the evening.     rosuvastatin (CRESTOR) 20 MG tablet TAKE ONE TABLET BY MOUTH EVERY DAY 90 tablet 3   sertraline (ZOLOFT) 100 MG tablet Take 1 tablet (100 mg total) by mouth daily. 90 tablet 1   Tocilizumab (ACTEMRA IV) Inject 0.4 mg into the vein every 30 (thirty) days.     predniSONE (DELTASONE) 10 MG tablet 2 tabs po qd x 10d, then 1 tab po qd x 10d 30 tablet 0   albuterol (VENTOLIN HFA) 108 (90 Base) MCG/ACT inhaler Inhale 2 puffs into the lungs every 6 (six) hours as needed for wheezing or shortness of breath. (Patient not taking: Reported on 06/14/2022) 8 g 0   polyethylene glycol (MIRALAX / GLYCOLAX) 17 g packet Take 17 g by mouth daily as needed for mild constipation. (Patient not taking: Reported on 06/14/2022) 14 each 0   promethazine (PHENERGAN) 12.5 MG tablet TAKE 1-2 TABLETS BY MOUTH EVERY SIX hours AS NEEDED FOR nausea (Patient not taking: Reported on 06/14/2022) 30 tablet 2   predniSONE (DELTASONE) 5 MG tablet Take 1-2 tablets (5-10 mg total) by mouth daily. (Patient not taking: Reported on 06/14/2022) 180 tablet 3   No facility-administered medications prior to visit.    Allergies  Allergen Reactions   Steri-Strip Compound Benzoin [Benzoin Compound] Anaphylaxis and Itching    Blisters and itching   Penicillins Rash    Tolerated Cephalosporin 05/26/21.     Pravastatin     Myalgias   Simvastatin     Myalgias   Hydrocodone-Acetaminophen Nausea And Vomiting   Hydroxychloroquine Sulfate Rash    ROS As per HPI  PE:    06/14/2022    9:18 AM 03/04/2022    3:14 PM 03/03/2022    3:24 PM  Vitals  with BMI  Height 5' 8.5" 5' 8.5" 5' 8.5"  Weight 144 lbs 3 oz 138 lbs 3 oz 137 lbs 6 oz  BMI 21.6  20.71 20.59  Systolic 98 110 107  Diastolic 64 68 68  Pulse 80 83 84     Physical Exam  Gen: Alert, well appearing.  Patient is oriented to person, place, time, and situation. AFFECT: pleasant, lucid thought and speech. CV: RRR, no m/r/g.   LUNGS: CTA bilat, nonlabored resps, good aeration in all lung fields. EXT: no clubbing or cyanosis.  no edema.    LABS:  Last CBC Lab Results  Component Value Date   WBC 7.4 02/17/2022   HGB 11.3 (L) 02/17/2022   HCT 34.7 (L) 02/17/2022   MCV 76.5 (L) 02/17/2022   MCH 27.5 05/27/2021   RDW 16.8 (H) 02/17/2022   PLT 327.0 02/17/2022   Lab Results  Component Value Date   IRON 30 (L) 11/02/2021   TIBC 375 11/02/2021   FERRITIN 53 11/02/2021   Last metabolic panel Lab Results  Component Value Date   GLUCOSE 179 (H) 02/17/2022   NA 134 (L) 02/17/2022   K 4.0 02/17/2022   CL 98 02/17/2022   CO2 22 02/17/2022   BUN 14 02/17/2022   CREATININE 0.75 02/17/2022   GFRNONAA >60 05/27/2021   CALCIUM 8.6 02/17/2022   PROT 6.5 02/17/2022   ALBUMIN 3.8 02/17/2022   BILITOT 0.4 02/17/2022   ALKPHOS 79 02/17/2022   AST 16 02/17/2022   ALT 17 02/17/2022   ANIONGAP 7 05/27/2021   Last lipids Lab Results  Component Value Date   CHOL 108 03/31/2021   HDL 32.20 (L) 03/31/2021   LDLCALC 65 09/22/2020   LDLDIRECT 47.0 03/31/2021   TRIG 209.0 (H) 03/31/2021   CHOLHDL 3 03/31/2021   Last hemoglobin A1c Lab Results  Component Value Date   HGBA1C 9.2 (H) 02/17/2022   Last thyroid functions Lab Results  Component Value Date   TSH 1.24 02/17/2022   IMPRESSION AND PLAN:  #1 hypertension, variable control lately.  A little bit on the low side now.  She will go back to half of the lisinopril 20 mg tab today.  Continue 12.5 mg Lopressor twice daily. Electrolytes and creatinine today.  2.  Hyperlipidemia, doing well on rosuvastatin 20 mg a day. Lipid panel today.  #3 adult ADD. Doing well on Adderall XR 15 mg a day. Controlled  substance contract and urine drug screen are up-to-date.  #4 migraine syndrome. Her migraines start with dizziness and the use of diazepam as needed helps this. Refilled today.  An After Visit Summary was printed and given to the patient.  FOLLOW UP: No follow-ups on file.  Signed:  Santiago Bumpers, MD           06/14/2022

## 2022-06-18 ENCOUNTER — Other Ambulatory Visit: Payer: Self-pay | Admitting: Cardiology

## 2022-07-08 ENCOUNTER — Ambulatory Visit (INDEPENDENT_AMBULATORY_CARE_PROVIDER_SITE_OTHER): Payer: Medicare (Managed Care) | Admitting: Internal Medicine

## 2022-07-08 ENCOUNTER — Encounter: Payer: Self-pay | Admitting: Internal Medicine

## 2022-07-08 VITALS — BP 122/82 | HR 81 | Ht 68.5 in | Wt 147.8 lb

## 2022-07-08 DIAGNOSIS — E041 Nontoxic single thyroid nodule: Secondary | ICD-10-CM

## 2022-07-08 DIAGNOSIS — E1165 Type 2 diabetes mellitus with hyperglycemia: Secondary | ICD-10-CM | POA: Diagnosis not present

## 2022-07-08 DIAGNOSIS — E2749 Other adrenocortical insufficiency: Secondary | ICD-10-CM

## 2022-07-08 DIAGNOSIS — E785 Hyperlipidemia, unspecified: Secondary | ICD-10-CM

## 2022-07-08 LAB — POCT GLYCOSYLATED HEMOGLOBIN (HGB A1C): Hemoglobin A1C: 6 % — AB (ref 4.0–5.6)

## 2022-07-08 MED ORDER — LANTUS SOLOSTAR 100 UNIT/ML ~~LOC~~ SOPN: 8.0000 [IU] | PEN_INJECTOR | Freq: Every day | SUBCUTANEOUS | 3 refills | Status: DC

## 2022-07-08 MED ORDER — GLIPIZIDE 5 MG PO TABS: 2.5000 mg | ORAL_TABLET | Freq: Two times a day (BID) | ORAL | 3 refills | Status: DC

## 2022-07-08 NOTE — Patient Instructions (Addendum)
Please continue: - Metformin ER 1000 mg 2x daily  Try to decrease: - Lantus to 8 units - Glipizide to 2.5-5 mg 2x a day before meals  Regarding Prednisone: - You absolutely need to take this medication every day and not skip doses. - Please double the dose if you have a fever, for the duration of the fever. - If you cannot take anything by mouth (vomiting) or you have severe diarrhea so that you eliminate the prednisone pills in your stool, please make sure that you get steroids in the vein instead - go to the nearest emergency department/urgent care or you may go to your PCPs office  - Please try to get a MedAlert bracelet or pendant indicating: "Adrenal insufficiency".   Please come back for a follow-up appointment in 3-4 months.

## 2022-07-08 NOTE — Progress Notes (Signed)
Patient ID: Raven [name], female   DOB: 1955/10/17, 66 y.o.   MRN: [id number]  HPI: Raven [name] is a 66 y.o.-year-old female, returning for f/u for DM2, dx ~1995, insulin-dependent since 11/2013, insulin-dependent, controlled, without long-term complications. Last visit 4.5 months ago.  Interim history: No increased urination, blurry vision, nausea, chest pain. She has bilateral carpal tunnel - had a steroid inj. She started Actemra for RA since last OV.  Reviewed HbA1c levels: Lab Results  Component Value Date   HGBA1C 9.2 (H) 02/17/2022   HGBA1C 5.6 04/30/2021   HGBA1C 7.7 (A) 02/27/2021   She has RA >> was on Simponi (changed from Remicade) and MTX >> started Orencia and stays on MTX. On Plaquenil.  On prednisone 5 mg daily.  Pt is on a regimen of: - Metformin ER 500 mg >> [phone] >> 1000 mg 2x a day - Glipizide 5 mg 2x before lunch and before dinner - Lantus 10 units daily - started at the end of the 03/2022 when sugars increased to 400/HI on higher dose steroids Previously on Ozempic 0.5 mg weekly - started 12/2018 >> indigestion and decreased appetite >> stopped 04/2021 Previously on Levemir 14 >> 10 >> 4-6 units at bedtime >> stopped 07/2019 She tried Glimepiride in the past >> fluctuating blood sugar. We stopped glipizide and Januvia only started Ozempic 12/2018. She had a CGM in the past but could not afford it anymore.  She checks sugars 2-3x a day per review of her log: - am:  105-144 (mostly 120s) >> 232, then 125-189 >> 70-106, 119 - 2h after b'fast:  n/c >> 143, 160 >> 140s >> 247>> n/c - before lunch: n/c >> 110-120 >> n/c>> 95-140 - 2h after lunch: 140-154 >> n/c >> 153 >> 140s >> 249>> n/c - before dinner: 124, 151 >> 303 on 20 mg Prednisone >> 72-135, 153 - 2h after dinner: 166, 186, 220 (steroids) >> 140s, 188 (icecream) >> n/c >> 120-168 - bedtime: 110-120 >> n/c   >> 83-130, 150, 260 - nighttime: n/c >> 100-150 >> n/c >> 151 Lowest sugar was 52 (x2  -Glipizide) ...>> 105 >> 125>> 63;  she has hypoglycemia awareness in the 70s. Highest sugar was 324 ...>> 220 >> 188 >> 303 >> 260.  Meter: ReliOn.  -No CKD, last BUN/creatinine:  Lab Results  Component Value Date   BUN 16 06/14/2022   CREATININE 0.62 06/14/2022  On lisinopril 10.  -+ HL; last set of lipids: Lab Results  Component Value Date   CHOL 146 06/14/2022   HDL 51.80 06/14/2022   LDLCALC 66 06/14/2022   LDLDIRECT 47.0 03/31/2021   TRIG 144.0 06/14/2022   CHOLHDL 3 06/14/2022  She was taken off statins in the past due to transaminitis in 10/2013.  Atorvastatin caused muscle cramps.  Currently on Crestor 10 and fish oil.  - last eye exam 04/2021: No DR reportedly.  She had cataract sx's 05/2018. Dr. Jorja Loa.  - Denies numbness and tingling in her feet.  Last foot exam 03/04/2022.  Started on Omeprazole for choking >> resolved.  She had an esophageal dilation 03/2020. She has steroid injections in knees - had 1 TKR and will need to have another one. She was dx'ed with PSVT after a syncopal episode at church at the beginning of 2022. On Metoprolol now.  Thyroid nodule: CT neck and soft tissue (03/13/2021) showing a 1 cm right thyroid nodule with calcification.  Thyroid U/S (05/11/2021): Parenchymal Echotexture: Mildly heterogenous  Isthmus: 0.4  cm  Right lobe: 4.6 x 1.9 x 1.7 cm  Left lobe: 3.0 x 1.0 x 1.3 cm  _____________________________________________________________________   Nodule # 1:  Location: Isthmus  Maximum size: 0.5 cm; Other 2 dimensions: 0.4 x 0.3 cm  Composition: solid/almost completely solid (2)  Echogenicity: hypoechoic (2)    Given size (<0.9 cm) and appearance, this nodule does NOT meet TI-RADS criteria for biopsy or dedicated follow-up.  ________________________________________________________   Nodule # 2:  Location: Right; Mid  Maximum size: 1.5 cm; Other 2 dimensions: 1.3 x 1.4 cm  Composition: solid/almost completely solid (2)   Echogenicity: hyperechoic (1)  Echogenic foci: macrocalcifications (1)    **Given size (>/= 1.5 cm) and appearance, fine needle aspiration of this moderately suspicious nodule should be considered based on TI-RADS criteria.   Nodule # 3:  Location: Left; Mid  Maximum size: 0.5 cm; Other 2 dimensions: 0.4 x 0.4 cm  Composition: solid/almost completely solid (2)  Echogenicity: hypoechoic (2)   Given size (<0.9 cm) and appearance, this nodule does NOT meet TI-RADS criteria for biopsy or dedicated follow-up.  _________________________________________________________   Small nonenlarged bilateral cervical lymph nodes demonstrate normal lymph node architecture.   IMPRESSION: 1. 1.5 cm right mid thyroid nodule (nodule 2) just meets criteria for fine-needle aspiration. 2. 0.5 cm isthmus and 0.5 cm left thyroid nodules do not meet criteria for biopsy or dedicated follow-up.   FNA (04/01/2022): AUS Afirma molecular marker: Benign  Pt denies: - feeling nodules in neck - hoarseness - dysphagia after Es dilation - choking  + FH of thyroid nodule in mother >> benign. No FH of ThyCA.  TSH levels normal: Lab Results  Component Value Date   TSH 1.24 02/17/2022   TSH 1.37 11/09/2019   TSH 3.72 08/18/2018   TSH 1.96 08/17/2017   TSH 1.03 07/23/2016   TSH 1.64 06/24/2014   TSH 1.39 02/15/2013   TSH 1.29 07/27/2012   TSH 1.79 12/22/2011   TSH 1.16 03/06/2008    ROS: + See HPI + Tremors, + joint pain  I reviewed pt's medications, allergies, PMH, social hx, family hx, and changes were documented in the history of present illness. Otherwise, unchanged from my initial visit note.  Past Medical History:  Diagnosis Date   Acute medial meniscus tear of right knee    Adrenal insufficiency (Addison's disease) ([place]) 02/2021   Anemia of chronic disease    Mild, Hb stable consistently   Anxiety and depression    Carpal tunnel syndrome    2023, bilateral   Chest pain 10/2017    +Cardiac CT.  Myocardial perfusion imaging NORMAL, EF>65%   Collagen vascular disease ([place])    Diabetes mellitus 1995   Managed by Dr. Cruzita Lederer (Endo)   GERD (gastroesophageal reflux disease) per pt watches diet and take tums as needed   with hx of prox esoph stricture + dilation procedure   H/O hiatal hernia    slightly larger ("moderate" size) on CT angio done to r/o PE 10/2017. noted on egd again 03/2021   History of adrenal insufficiency    in 2022 when she tried coming off her chronic daily prednisone for her RA   Hyperlipidemia    Hypertension    Interstitial cystitis    Lichen sclerosus of female genitalia    Migraine    Osteoarthritis, multiple sites    Palpitations 10/2017   3 wk event monitor: symptoms correlate with sinus rhythm with PACs, at times runs of PACs up to 5 beats.  Pseudogout of knee, left    colchicine helpful   Psoriasis    Seasonal allergies    Seropositive rheumatoid arthritis (HCC)    Hands, knees, jaw, elbow: responding to Simponi as of 09/29/15 rheum f/u.  Waning effectiveness on 03/2016 f/u so pt switched to Orencia injections.  Doing well on chronic low-dose prednisone therapy+ methotrexate +orencia as of 01/2019, 08/2019, 11/2019    Past Surgical History:  Procedure Laterality Date   ABDOMINAL HYSTERECTOMY  1991   for fibroids; ovaries still in   BIOPSY  03/17/2021   Procedure: BIOPSY;  Surgeon: Kathi Der, MD;  Location: [facility];  Service: Gastroenterology;;   BREAST BIOPSY  2001; 02/12/16   fibrocystic breast disease in 2001 and again in 02/2016 (+scar from prev bx site w/calcifications).   CARDIOVASCULAR STRESS TEST  11/01/2017   Myocard perf imaging: NORMAL (EF >60-65%).  12/30/21 normal   COLONOSCOPY  09/12/2013; 02/25/20   Normal 2015 and 2021: Recall 5 yrs ([facility], Dr. Evette Cristal) due to FH of colon polyps.   CYSTOSCOPY  2004   for chronic UTI   DEXA  10/2016   Bone density normal (T-score 0.0)   ESOPHAGOGASTRODUODENOSCOPY  04/19/2011    Nl except antral gastritis: bx = mild chron gastritis (H pyloria neg)    ESOPHAGOGASTRODUODENOSCOPY (EGD) WITH PROPOFOL N/A 03/27/2020   Esoph stenosis-->dilated.  Mod size hiatal hernia ([facility]). Procedure: ESOPHAGOGASTRODUODENOSCOPY (EGD) WITH PROPOFOL ;  Surgeon: Graylin Shiver, MD;  Location: [facility];  Service: Endoscopy;  Laterality: N/A;   ESOPHAGOGASTRODUODENOSCOPY (EGD) WITH PROPOFOL N/A 03/17/2021   Esoph stricture->dilated.  Large hiatal hernia. Also +esoph candidiasis, bx inflammation.  Procedure: ESOPHAGOGASTRODUODENOSCOPY (EGD) WITH PROPOFOL;  Surgeon: Kathi Der, MD;  Location: [facility];  Service: Gastroenterology;  Laterality: N/A;   EVENT MONITOR  10/2017   3 wk-->Symptoms correlate with sinus rhythm with PACs, at times runs of PACs up to 5 beats.   GALLBLADDER SURGERY  2019   intraarticular steroid injection  2013   3 R knee & L X 1; Dr Netta Corrigan   KNEE ARTHROCENTESIS  2013   R knee x 3 & L X 1   KNEE ARTHROSCOPY  10/21/2011   Procedure: ARTHROSCOPY KNEE;  Surgeon: Jacki Cones, MD;  Location[facility];  Service: Orthopedics;  Laterality: Right;  WITH MEDIAL and lateral shaving of femoral chondyl  with microfracture technique of lateral and medial femoral chondyl suprapatellar synovectomy   LAPAROSCOPIC CHOLECYSTECTOMY  01/2018   myocardial perf imaging     Normal 12/2021   SAVORY DILATION N/A 03/27/2020   Procedure: SAVORY DILATION;  Surgeon: Graylin Shiver, MD;  Location: [facility];  Service: Endoscopy;  Laterality: N/A;   TOTAL KNEE ARTHROPLASTY  04/12/2012   RIGHT  Procedure: TOTAL KNEE ARTHROPLASTY;  Surgeon: Jacki Cones, MD;  Location: [facility];  Service: Orthopedics;  Laterality: Right;  Right Total Knee Arthroplasty   TOTAL KNEE ARTHROPLASTY Left 05/26/2021   Procedure: TOTAL KNEE ARTHROPLASTY;  Surgeon: Durene Romans, MD;  Location: [facility];  Service: Orthopedics;  Laterality: Left;   TRANSTHORACIC ECHOCARDIOGRAM   11/13/2021   EF 65-70%, mild dec RV fxn, grd I DD, o/w normal   TUBAL LIGATION  1981    Social History   Socioeconomic History   Marital status: Married    Spouse name: Not on file   Number of children: Not on file   Years of education: Not on file   Highest education level: Not on file  Occupational History   Not on file  Tobacco Use   Smoking status: Never   Smokeless tobacco: Never  Vaping Use   Vaping Use: Never used  Substance and Sexual Activity   Alcohol use: No   Drug use: No   Sexual activity: Not on file  Other Topics Concern   Not on file  Social History Narrative   Married, 3 children (2 in [place], 1 in [place]).  4 grandchildren.   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   2 cups of coffee/day x 3 months   Regular exercise- no-due to arthritis.   Religion affecting care, "it allows stress management:"   Social Determinants of Health   Financial Resource Strain: Not on file  Food Insecurity: Not on file  Transportation Needs: Not on file  Physical Activity: Not on file  Stress: Not on file  Social Connections: Not on file  Intimate Partner Violence: Not on file    Current Outpatient Medications on File Prior to Visit  Medication Sig Dispense Refill   acetaminophen (TYLENOL) 500 MG tablet Take 1,000 mg by mouth every 6 (six) hours as needed for moderate pain.     albuterol (VENTOLIN HFA) 108 (90 Base) MCG/ACT inhaler Inhale 2 puffs into the lungs every 6 (six) hours as needed for wheezing or shortness of breath. (Patient not taking: Reported on 06/14/2022) 8 g 0   amphetamine-dextroamphetamine (ADDERALL XR) 15 MG 24 hr capsule TAKE ONE CAPSULE BY MOUTH EVERY MORNING 30 capsule 0   aspirin EC 81 MG tablet Take 81 mg by mouth daily. Swallow whole.     cetirizine (ZYRTEC) 10 MG tablet Take 10 mg by mouth daily.     Colchicine 0.6 MG CAPS Take 0.6 mg by mouth 2 (two) times daily.     CYANOCOBALAMIN PO Take 1 tablet by mouth  daily.     diazepam (VALIUM) 5 MG tablet TAKE ONE TABLET BY MOUTH EVERY TWELVE HOURS AS NEEDED FOR MIGRAINE HEADACHES 60 tablet 5   docusate sodium (COLACE) 100 MG capsule Take 1 capsule (100 mg total) by mouth 2 (two) times daily. 10 capsule 0   Ferrous Sulfate (IRON PO) Take 1 tablet by mouth daily.     FOLIC ACID PO Take 2 mg by mouth at bedtime.     glipiZIDE (GLUCOTROL) 5 MG tablet Take 1 tablet (5 mg total) by mouth 2 (two) times daily before a meal. 180 tablet 3   insulin glargine (LANTUS SOLOSTAR) 100 UNIT/ML Solostar Pen Inject 10 Units into the skin at bedtime. 15 mL 3   Insulin Pen Needle 32G X 4 MM MISC Use 1x a day 100 each 3   leflunomide (ARAVA) 20 MG tablet Take 20 mg by mouth daily.     lisinopril (ZESTRIL) 20 MG tablet TAKE ONE-HALF TABLET BY MOUTH EVERY DAY 45 tablet 3   metFORMIN (GLUCOPHAGE-XR) 500 MG 24 hr tablet Take 2 tablets (1,000 mg total) by mouth 2 (two) times daily with a meal. 360 tablet 3   methotrexate 50 MG/2ML injection Inject 22.5 mg into the muscle every Monday.     metoprolol tartrate (LOPRESSOR) 25 MG tablet TAKE 1/2 TABLET (12.5mg  total) BY MOUTH TWICE DAILY 90 tablet 3   omeprazole (PRILOSEC) 40 MG capsule Take 1 capsule (40 mg total) by mouth 2 (two) times daily. 180 capsule 3   phenazopyridine (PYRIDIUM) 95 MG tablet Take 190 mg by mouth 2 (two) times daily as needed for pain.  polyethylene glycol (MIRALAX / GLYCOLAX) 17 g packet Take 17 g by mouth daily as needed for mild constipation. (Patient not taking: Reported on 06/14/2022) 14 each 0   Polyvinyl Alcohol-Povidone (REFRESH OP) Place 1 drop into both eyes 2 (two) times daily.     predniSONE (DELTASONE) 5 MG tablet Take 1-2 tablets (5-10 mg total) by mouth daily. 180 tablet 3   primidone (MYSOLINE) 50 MG tablet Take 50-100 mg by mouth See admin instructions. Take 50 mg by mouth in the morning and 100 mg by mouth in the evening.     promethazine (PHENERGAN) 12.5 MG tablet TAKE 1-2 TABLETS BY MOUTH  EVERY SIX hours AS NEEDED FOR nausea (Patient not taking: Reported on 06/14/2022) 30 tablet 2   rosuvastatin (CRESTOR) 20 MG tablet TAKE ONE TABLET BY MOUTH EVERY DAY 90 tablet 3   sertraline (ZOLOFT) 100 MG tablet Take 1 tablet (100 mg total) by mouth daily. 90 tablet 1   Tocilizumab (ACTEMRA IV) Inject 0.4 mg into the vein every 30 (thirty) days.     No current facility-administered medications on file prior to visit.    Allergies  Allergen Reactions   Steri-Strip Compound Benzoin [Benzoin Compound] Anaphylaxis and Itching    Blisters and itching   Penicillins Rash    Tolerated Cephalosporin 05/26/21.     Pravastatin     Myalgias   Simvastatin     Myalgias   Hydrocodone-Acetaminophen Nausea And Vomiting   Hydroxychloroquine Sulfate Rash    Family History  Problem Relation Age of Onset   Diabetes Mother    Stroke Mother 30   Osteoarthritis Mother    Stroke Brother 20   Heart disease Father        CABG   Lung cancer Father    Prostate cancer Father    Pancreatic cancer Father    Pancreatic cancer Paternal Grandfather    Bipolar disorder Daughter    Anxiety disorder Son    Arthritis Maternal Grandmother        rheumatoid   Heart attack Brother 92       smoker   PE: BP 122/82 (BP Location: Right Arm, Patient Position: Sitting, Cuff Size: Normal)   Pulse 81   Ht 5' 8.5" (1.74 m)   Wt 147 lb 12.8 oz (67 kg)   SpO2 98%   BMI 22.15 kg/m    Wt Readings from Last 3 Encounters:  07/08/22 147 lb 12.8 oz (67 kg)  06/14/22 144 lb 3.2 oz (65.4 kg)  03/04/22 138 lb 3.2 oz (62.7 kg)   Constitutional: normal weight, in NAD Eyes: no exophthalmos ENT: no masses palpated in neck, no cervical lymphadenopathy Cardiovascular: RRR, No MRG Respiratory: CTA B Musculoskeletal: no deformities Skin: no rashes Neurological: + tremor with outstretched hands  ASSESSMENT: 1. DM2, insulin-independent, uncontrolled, without long term complications, but with Hgly -She was on the  freestyle libre CGM in the past but cannot afford it anymore.  2. HL  3.  Central adrenal insufficiency  4.  Right thyroid nodule  PLAN:  1. Patient with longstanding, uncontrolled, type 2 diabetes, with improved control after adding the GLP-1 receptor agonist over metformin ER regimen.  We could not use a high dose of Ozempic due to diarrhea.  Her sugars improved significantly after this so she could come off insulin.  At last visit, however, HbA1c was high again, at 9.2% after she had higher steroid doses.  She was also off all of her medicines including metformin and prednisone. -After  last visit, she contacted me with very high blood sugars in the 400s to HI  due to high-dose prednisone.  We had to start on low-dose insulin, 10 units daily.  Sugars were still high afterwards so we added glipizide.  On this regimen, sugars greatly improved.  She tried to reduce the glipizide to just once a day, blood sugars were still high so she is not taking it twice a day. -Reviewing her blood sugars at home, they are on the lower end of the normal range, with the majority of the blood sugars between 70-90.  Therefore, I advised her to reduce the Lantus and the glipizide dose for now.  My plan will be to stop glipizide if sugars continue to improve.  However, I do suspect that we need to continue a lower dose of basal insulin for her. - I suggested to:  Patient Instructions  Please continue: - Metformin ER 1000 mg 2x daily  Try to decrease: - Lantus to 8 units - Glipizide to 2.5-5 mg 2x a day before meals  Regarding Prednisone: - You absolutely need to take this medication every day and not skip doses. - Please double the dose if you have a fever, for the duration of the fever. - If you cannot take anything by mouth (vomiting) or you have severe diarrhea so that you eliminate the prednisone pills in your stool, please make sure that you get steroids in the vein instead - go to the nearest emergency  department/urgent care or you may go to your PCPs office   Please come back for a follow-up appointment in 3-4 months.  - we checked her HbA1c: 6.0% (lower) - advised to check sugars at different times of the day - 1x a day, rotating check times - advised for yearly eye exams >> she is not UTD - she has a medical alert bracelet mentioning adrenal insufficiency - return to clinic in 3-4 months   2. HL -Reviewed latest lipid panel from 06/2022: Fractions at goal: Lab Results  Component Value Date   CHOL 146 06/14/2022   HDL 51.80 06/14/2022   LDLCALC 66 06/14/2022   LDLDIRECT 47.0 03/31/2021   TRIG 144.0 06/14/2022   CHOLHDL 3 06/14/2022  -Continues Crestor 10 mg daily and fish oil, without side effects  3. Central adrenal insufficiency  -Iatrogenic, due to steroid treatment for RA -Patient has had RA for many years, previously on 5 mg of prednisone daily and also intermittent prednisone tapers, but she stopped prednisone completely before last visit in an effort to manage her diabetes.  She started to feel very poorly, with weight loss, decreased appetite, weakness, and overall, feeling terrible.  We restarted prednisone right away. -At last visit, she again returned after period of time when she missed her prednisone.  We again discussed about the fact that this is extremely dangerous.  She absolutely cannot miss prednisone doses.   -At last visit and again today, I emphasized the sick day rules to manage adrenal insufficiency  4.  Right thyroid nodule -Incidentally found on CT of the neck and soft tissues (03/13/2021): 1 cm right thyroid nodule with calcification. -We checked a thyroid ultrasound after last visit (05/11/2021): Right mid 1.5 x 1.3 x 1.4 cm nodule appears to be solid, hypoechoic with macrocalcifications and the recommendation was made for biopsy.  Other nodules were subcm and not worrisome.  -Since last visit, she finally had the thyroid nodule biopsy 04/01/2022 and this  was initially inconclusive, AUS, but the  Afirma molecular marker returned benign. -She denies neck compression symptoms -Latest TSH was normal: Lab Results  Component Value Date   TSH 1.24 02/17/2022  -Will continue to follow her expectantly for now.   Philemon Kingdom, MD PhD [facility] Endocrinology

## 2022-07-12 HISTORY — PX: CARPAL TUNNEL RELEASE: SHX101

## 2022-07-15 DIAGNOSIS — R197 Diarrhea, unspecified: Secondary | ICD-10-CM | POA: Diagnosis not present

## 2022-07-15 DIAGNOSIS — M1991 Primary osteoarthritis, unspecified site: Secondary | ICD-10-CM | POA: Diagnosis not present

## 2022-07-15 DIAGNOSIS — M1120 Other chondrocalcinosis, unspecified site: Secondary | ICD-10-CM | POA: Diagnosis not present

## 2022-07-15 DIAGNOSIS — Z6821 Body mass index (BMI) 21.0-21.9, adult: Secondary | ICD-10-CM | POA: Diagnosis not present

## 2022-07-15 DIAGNOSIS — Z79899 Other long term (current) drug therapy: Secondary | ICD-10-CM | POA: Diagnosis not present

## 2022-07-15 DIAGNOSIS — M0589 Other rheumatoid arthritis with rheumatoid factor of multiple sites: Secondary | ICD-10-CM | POA: Diagnosis not present

## 2022-07-15 DIAGNOSIS — L408 Other psoriasis: Secondary | ICD-10-CM | POA: Diagnosis not present

## 2022-07-20 DIAGNOSIS — M0589 Other rheumatoid arthritis with rheumatoid factor of multiple sites: Secondary | ICD-10-CM | POA: Diagnosis not present

## 2022-08-04 ENCOUNTER — Other Ambulatory Visit: Payer: Self-pay | Admitting: Family Medicine

## 2022-08-04 NOTE — Telephone Encounter (Signed)
Requesting: adderall 15mg  Contract: 06/14/22 UDS: 11/02/21 Last Visit: 06/14/22 Next Visit: 10/14/22 Last Refill: 06/14/22 (30,0)  Please Advise. Med pending

## 2022-08-24 ENCOUNTER — Other Ambulatory Visit: Payer: Self-pay | Admitting: Family Medicine

## 2022-08-24 ENCOUNTER — Encounter: Payer: Self-pay | Admitting: Family Medicine

## 2022-08-24 DIAGNOSIS — Z1231 Encounter for screening mammogram for malignant neoplasm of breast: Secondary | ICD-10-CM

## 2022-08-26 ENCOUNTER — Ambulatory Visit: Payer: Medicare (Managed Care)

## 2022-08-26 ENCOUNTER — Encounter: Payer: Self-pay | Admitting: Family Medicine

## 2022-08-26 ENCOUNTER — Ambulatory Visit (INDEPENDENT_AMBULATORY_CARE_PROVIDER_SITE_OTHER): Payer: Medicare (Managed Care)

## 2022-08-26 ENCOUNTER — Ambulatory Visit (INDEPENDENT_AMBULATORY_CARE_PROVIDER_SITE_OTHER): Payer: Medicare (Managed Care) | Admitting: Family Medicine

## 2022-08-26 VITALS — BP 137/80 | HR 83 | Temp 98.6°F | Ht 68.5 in | Wt 158.6 lb

## 2022-08-26 VITALS — BP 137/80 | HR 83 | Temp 98.6°F | Wt 158.6 lb

## 2022-08-26 DIAGNOSIS — J988 Other specified respiratory disorders: Secondary | ICD-10-CM

## 2022-08-26 DIAGNOSIS — Z0000 Encounter for general adult medical examination without abnormal findings: Secondary | ICD-10-CM | POA: Diagnosis not present

## 2022-08-26 DIAGNOSIS — R519 Headache, unspecified: Secondary | ICD-10-CM

## 2022-08-26 DIAGNOSIS — U071 COVID-19: Secondary | ICD-10-CM | POA: Diagnosis not present

## 2022-08-26 LAB — POC COVID19 BINAXNOW: SARS Coronavirus 2 Ag: POSITIVE — AB

## 2022-08-26 NOTE — Progress Notes (Deleted)
p

## 2022-08-26 NOTE — Patient Instructions (Signed)

## 2022-08-26 NOTE — Progress Notes (Signed)
OFFICE VISIT  08/26/2022  CC: headache  Patient is a 67 y.o. female who presents for headache.  HPI: 6 days ago Raven [name] developed headache, sore throat, nasal congestion, some coughing and sneezing.  No fever or bodyaches.  All of her symptoms have improved with the exception of still having a significant headache.  She takes 2 of the arthritis strength Tylenol twice a day for her arthritis but is not been helping her headache at all.  Review of systems: No nausea, vomiting, diarrhea, or rash.  Her headache is generalized.  No neck stiffness. She has some left ear pain.  Past Medical History:  Diagnosis Date   Acute medial meniscus tear of right knee    Adrenal insufficiency (Addison's disease) ([place]) 02/2021   Anemia of chronic disease    Mild, Hb stable consistently   Anxiety and depression    Carpal tunnel syndrome    2023, bilateral   Chest pain 10/2017   +Cardiac CT.  Myocardial perfusion imaging NORMAL, EF>65%   Collagen vascular disease ([place])    Diabetes mellitus 1995   Managed by Dr. Cruzita Lederer (Endo)   GERD (gastroesophageal reflux disease) per pt watches diet and take tums as needed   with hx of prox esoph stricture + dilation procedure   H/O hiatal hernia    slightly larger ("moderate" size) on CT angio done to r/o PE 10/2017. noted on egd again 03/2021   History of adrenal insufficiency    in 2022 when she tried coming off her chronic daily prednisone for her RA   Hyperlipidemia    Hypertension    Interstitial cystitis    Lichen sclerosus of female genitalia    Migraine    Osteoarthritis, multiple sites    Palpitations 10/2017   3 wk event monitor: symptoms correlate with sinus rhythm with PACs, at times runs of PACs up to 5 beats.   Pseudogout of knee, left    colchicine helpful   Psoriasis    Seasonal allergies    Seropositive rheumatoid arthritis ([place])    Hands, knees, jaw, elbow: responding to Simponi as of 09/29/15 rheum f/u.  Waning effectiveness on 03/2016 f/u  so pt switched to Orencia injections.  Doing well on chronic low-dose prednisone therapy+ methotrexate +orencia as of 01/2019, 08/2019, 11/2019    Past Surgical History:  Procedure Laterality Date   ABDOMINAL HYSTERECTOMY  1991   for fibroids; ovaries still in   BIOPSY  03/17/2021   Procedure: BIOPSY;  Surgeon: Otis Brace, MD;  Location: [facility];  Service: Gastroenterology;;   BREAST BIOPSY  2001; 02/12/16   fibrocystic breast disease in 2001 and again in 02/2016 (+scar from prev bx site w/calcifications).   CARDIOVASCULAR STRESS TEST  11/01/2017   Myocard perf imaging: NORMAL (EF >60-65%).  12/30/21 normal   CARPAL TUNNEL RELEASE Right 2024   COLONOSCOPY  09/12/2013; 02/25/20   Normal 2015 and 2021: Recall 5 yrs ([facility], Dr. Penelope Coop) due to M[place] of colon polyps.   CYSTOSCOPY  2004   for chronic UTI   DEXA  10/2016   Bone density normal (T-score 0.0)   ESOPHAGOGASTRODUODENOSCOPY  04/19/2011   Nl except antral gastritis: bx = mild chron gastritis (H pyloria neg)    ESOPHAGOGASTRODUODENOSCOPY (EGD) WITH PROPOFOL N/A 03/27/2020   Esoph stenosis-->dilated.  Mod size hiatal hernia ([place]). Procedure: ESOPHAGOGASTRODUODENOSCOPY (EGD) WITH PROPOFOL ;  Surgeon: Wonda Horner, MD;  Location: [facility];  Service: Endoscopy;  Laterality: N/A;   ESOPHAGOGASTRODUODENOSCOPY (EGD) WITH PROPOFOL  N/A 03/17/2021   Esoph stricture->dilated.  Large hiatal hernia. Also +esoph candidiasis, bx inflammation.  Procedure: ESOPHAGOGASTRODUODENOSCOPY (EGD) WITH PROPOFOL;  Surgeon: Otis Brace, MD;  Location: [facility];  Service: Gastroenterology;  Laterality: N/A;   EVENT MONITOR  10/2017   3 wk-->Symptoms correlate with sinus rhythm with PACs, at times runs of PACs up to 5 beats.   GALLBLADDER SURGERY  2019   intraarticular steroid injection  2013   3 R knee & L X 1; Dr Rushie Nyhan   KNEE ARTHROCENTESIS  2013   R knee x 3 & L X 1   KNEE ARTHROSCOPY  10/21/2011   Procedure: ARTHROSCOPY KNEE;   Surgeon: Tobi Bastos, MD;  Location: [facility];  Service: Orthopedics;  Laterality: Right;  WITH MEDIAL and lateral shaving of femoral chondyl  with microfracture technique of lateral and medial femoral chondyl suprapatellar synovectomy   LAPAROSCOPIC CHOLECYSTECTOMY  01/2018   myocardial perf imaging     Normal 12/2021   SAVORY DILATION N/A 03/27/2020   Procedure: SAVORY DILATION;  Surgeon: Wonda Horner, MD;  Location: [facility];  Service: Endoscopy;  Laterality: N/A;   TOTAL KNEE ARTHROPLASTY  04/12/2012   RIGHT  Procedure: TOTAL KNEE ARTHROPLASTY;  Surgeon: Tobi Bastos, MD;  Location: [facility];  Service: Orthopedics;  Laterality: Right;  Right Total Knee Arthroplasty   TOTAL KNEE ARTHROPLASTY Left 05/26/2021   Procedure: TOTAL KNEE ARTHROPLASTY;  Surgeon: Paralee Cancel, MD;  Location: [facility];  Service: Orthopedics;  Laterality: Left;   TRANSTHORACIC ECHOCARDIOGRAM  11/13/2021   EF 65-70%, mild dec RV fxn, grd I DD, o/w normal   TUBAL LIGATION  1981    Outpatient Medications Prior to Visit  Medication Sig Dispense Refill   acetaminophen (TYLENOL) 500 MG tablet Take 1,000 mg by mouth every 6 (six) hours as needed for moderate pain.     albuterol (VENTOLIN HFA) 108 (90 Base) MCG/ACT inhaler Inhale 2 puffs into the lungs every 6 (six) hours as needed for wheezing or shortness of breath. (Patient not taking: Reported on 06/14/2022) 8 g 0   amphetamine-dextroamphetamine (ADDERALL XR) 15 MG 24 hr capsule TAKE ONE CAPSULE BY MOUTH EVERY MORNING 30 capsule 0   aspirin EC 81 MG tablet Take 81 mg by mouth daily. Swallow whole.     cetirizine (ZYRTEC) 10 MG tablet Take 10 mg by mouth daily.     Colchicine 0.6 MG CAPS Take 0.6 mg by mouth 2 (two) times daily.     CYANOCOBALAMIN PO Take 1 tablet by mouth daily.     diazepam (VALIUM) 5 MG tablet TAKE ONE TABLET BY MOUTH EVERY TWELVE HOURS AS NEEDED FOR MIGRAINE HEADACHES 60 tablet 5   docusate sodium (COLACE) 100 MG capsule  Take 1 capsule (100 mg total) by mouth 2 (two) times daily. 10 capsule 0   Ferrous Sulfate (IRON PO) Take 1 tablet by mouth daily.     FOLIC ACID PO Take 2 mg by mouth at bedtime.     glipiZIDE (GLUCOTROL) 5 MG tablet Take 0.5-1 tablets (2.5-5 mg total) by mouth 2 (two) times daily before a meal. 180 tablet 3   insulin glargine (LANTUS SOLOSTAR) 100 UNIT/ML Solostar Pen Inject 8-10 Units into the skin at bedtime. 15 mL 3   Insulin Pen Needle 32G X 4 MM MISC Use 1x a day 100 each 3   leflunomide (ARAVA) 20 MG tablet Take 20 mg by mouth daily.     lisinopril (ZESTRIL) 20 MG tablet TAKE  ONE-HALF TABLET BY MOUTH EVERY DAY 45 tablet 3   metFORMIN (GLUCOPHAGE-XR) 500 MG 24 hr tablet Take 2 tablets (1,000 mg total) by mouth 2 (two) times daily with a meal. 360 tablet 3   methotrexate 50 MG/2ML injection Inject 22.5 mg into the muscle every Monday.     metoprolol tartrate (LOPRESSOR) 25 MG tablet TAKE 1/2 TABLET (12.85m total) BY MOUTH TWICE DAILY 90 tablet 3   omeprazole (PRILOSEC) 40 MG capsule Take 1 capsule (40 mg total) by mouth 2 (two) times daily. 180 capsule 3   phenazopyridine (PYRIDIUM) 95 MG tablet Take 190 mg by mouth 2 (two) times daily as needed for pain.     polyethylene glycol (MIRALAX / GLYCOLAX) 17 g packet Take 17 g by mouth daily as needed for mild constipation. (Patient not taking: Reported on 06/14/2022) 14 each 0   Polyvinyl Alcohol-Povidone (REFRESH OP) Place 1 drop into both eyes 2 (two) times daily.     predniSONE (DELTASONE) 5 MG tablet Take 1-2 tablets (5-10 mg total) by mouth daily. 180 tablet 3   primidone (MYSOLINE) 50 MG tablet Take 50-100 mg by mouth See admin instructions. Take 50 mg by mouth in the morning and 100 mg by mouth in the evening.     promethazine (PHENERGAN) 12.5 MG tablet TAKE 1-2 TABLETS BY MOUTH EVERY SIX hours AS NEEDED FOR nausea (Patient not taking: Reported on 06/14/2022) 30 tablet 2   rosuvastatin (CRESTOR) 20 MG tablet TAKE ONE TABLET BY MOUTH EVERY DAY  90 tablet 3   sertraline (ZOLOFT) 100 MG tablet Take 1 tablet (100 mg total) by mouth daily. 90 tablet 1   Tocilizumab (ACTEMRA IV) Inject 0.4 mg into the vein every 30 (thirty) days.     No facility-administered medications prior to visit.    Allergies  Allergen Reactions   Steri-Strip Compound Benzoin [Benzoin Compound] Anaphylaxis and Itching    Blisters and itching   Penicillins Rash    Tolerated Cephalosporin 05/26/21.     Pravastatin     Myalgias   Simvastatin     Myalgias   Hydrocodone-Acetaminophen Nausea And Vomiting   Hydroxychloroquine Sulfate Rash    Review of Systems  As per HPI  PE:    08/26/2022    1:32 PM 07/08/2022    3:09 PM 06/14/2022    9:18 AM  Vitals with BMI  Height  5' 8.5" 5' 8.5"  Weight 158 lbs 10 oz 147 lbs 13 oz 144 lbs 3 oz  BMI  [id number] Systolic [id number]  Diastolic 80 82 64  Pulse 83 81 80     Physical Exam  Gen: Alert, well appearing.  Patient is oriented to person, place, time, and situation. AFFECT: pleasant, lucid thought and speech. [id number] no injection, icteris, swelling, or exudate.  EOMI, PERRLA. Mouth: lips without lesion/swelling.  Oral mucosa pink and moist. Oropharynx without erythema, exudate, or swelling.  NECK: no LAD CV: RRR, no m/r/g.   LUNGS: CTA bilat, nonlabored resps, good aeration in all lung fields.  LABS:  Last metabolic panel Lab Results  Component Value Date   GLUCOSE 66 (L) 06/14/2022   NA 137 06/14/2022   K 4.5 06/14/2022   CL 103 06/14/2022   CO2 24 06/14/2022   BUN 16 06/14/2022   CREATININE 0.62 06/14/2022   GFRNONAA >60 05/27/2021   CALCIUM 8.4 06/14/2022   PROT 6.5 02/17/2022   ALBUMIN 3.8 02/17/2022   BILITOT 0.4 02/17/2022   ALKPHOS 79 02/17/2022  AST 16 02/17/2022   ALT 17 02/17/2022   ANIONGAP 7 05/27/2021   IMPRESSION AND PLAN:  #1 COVID-19 respiratory infection. Headache is her most prominent symptom still bothering her. Covid POS here today. She is out of the  antiviral treatment window. For her headache will give 60 mg Toradol IM in the office today.  (GFR 92 Dec 2023).  Flu and strep NEG here today.  An After Visit Summary was printed and given to the patient.  FOLLOW UP: Return if symptoms worsen or fail to improve.  Signed:  Crissie Sickles, MD           08/26/2022

## 2022-08-26 NOTE — Progress Notes (Signed)
Subjective:   Raven Raven [name] is a 67 y.o. female who presents for an Initial Medicare Annual Wellness Visit.  Review of Systems    Defer Raven [name] PCP Cardiac Risk Factors include: advanced age (>53mn, >>27women);diabetes mellitus;hypertension;sedentary lifestyle     Objective:    Today's Vitals   08/26/22 1359 08/26/22 1401  BP: 137/80   Pulse: 83   Temp: 98.6 F (37 C)   SpO2: 98%   Weight: 158 lb 9.6 oz (71.9 kg)   Height: 5' 8.5" (1.74 m)   PainSc:  3    Body mass index is 23.76 kg/m.     08/26/2022    2:13 PM 05/26/2021    6:00 PM 05/26/2021    7:58 AM 03/17/2021    7:58 AM 02/19/2021    9:14 AM 03/27/2020   10:56 AM 04/12/2012    2:24 PM  Advanced Directives  Does Patient Have a Medical Advance Directive? Yes No No Yes Yes Yes Patient has advance directive, copy not in chart  Type of Advance Directive Healthcare Power of [place] Does patient want Raven [name] make changes Raven [name] medical advance directive? No - Patient declined        Copy of HSt. [place] Chart? No - copy requested   Yes - validated most recent copy scanned in chart (See row information) No - copy requested No - copy requested Copy requested from family  Would patient like information on creating a medical advance directive?  No - Patient declined No - Patient declined        Current Medications (verified) Outpatient Encounter Medications as of 08/26/2022  Medication Sig   acetaminophen (TYLENOL) 500 MG tablet Take 1,000 mg by mouth every 6 (six) hours as needed for moderate pain.   albuterol (VENTOLIN HFA) 108 (90 Base) MCG/ACT inhaler Inhale 2 puffs into the lungs every 6 (six) hours as needed for wheezing or shortness of breath. (Patient not taking: Reported on 06/14/2022)   amphetamine-dextroamphetamine (ADDERALL XR) 15 MG 24 hr capsule TAKE ONE CAPSULE BY MOUTH EVERY MORNING    aspirin EC 81 MG tablet Take 81 mg by mouth daily. Swallow whole.   cetirizine (ZYRTEC) 10 MG tablet Take 10 mg by mouth daily.   Colchicine 0.6 MG CAPS Take 0.6 mg by mouth 2 (two) times daily.   CYANOCOBALAMIN PO Take 1 tablet by mouth daily.   diazepam (VALIUM) 5 MG tablet TAKE ONE TABLET BY MOUTH EVERY TWELVE HOURS AS NEEDED FOR MIGRAINE HEADACHES   docusate sodium (COLACE) 100 MG capsule Take 1 capsule (100 mg total) by mouth 2 (two) times daily.   Ferrous Sulfate (IRON PO) Take 1 tablet by mouth daily.   FOLIC ACID PO Take 2 mg by mouth at bedtime.   glipiZIDE (GLUCOTROL) 5 MG tablet Take 0.5-1 tablets (2.5-5 mg total) by mouth 2 (two) times daily before a meal.   insulin glargine (LANTUS SOLOSTAR) 100 UNIT/ML Solostar Pen Inject 8-10 Units into the skin at bedtime.   Insulin Pen Needle 32G X 4 MM MISC Use 1x a day   leflunomide (ARAVA) 20 MG tablet Take 20 mg by mouth daily.   lisinopril (ZESTRIL) 20 MG tablet TAKE ONE-HALF TABLET BY MOUTH EVERY DAY   metFORMIN (GLUCOPHAGE-XR) 500 MG 24 hr tablet Take 2 tablets (1,000 mg total) by mouth 2 (two) times daily with a meal.   methotrexate  50 MG/2ML injection Inject 22.5 mg into the muscle every Monday.   metoprolol tartrate (LOPRESSOR) 25 MG tablet TAKE 1/2 TABLET (12.20m total) BY MOUTH TWICE DAILY   omeprazole (PRILOSEC) 40 MG capsule Take 1 capsule (40 mg total) by mouth 2 (two) times daily.   phenazopyridine (PYRIDIUM) 95 MG tablet Take 190 mg by mouth 2 (two) times daily as needed for pain.   polyethylene glycol (MIRALAX / GLYCOLAX) 17 g packet Take 17 g by mouth daily as needed for mild constipation. (Patient not taking: Reported on 06/14/2022)   Polyvinyl Alcohol-Povidone (REFRESH OP) Place 1 drop into both eyes 2 (two) times daily.   predniSONE (DELTASONE) 5 MG tablet Take 1-2 tablets (5-10 mg total) by mouth daily.   primidone (MYSOLINE) 50 MG tablet Take 50-100 mg by mouth See admin instructions. Take 50 mg by mouth in the morning and  100 mg by mouth in the evening.   promethazine (PHENERGAN) 12.5 MG tablet TAKE 1-2 TABLETS BY MOUTH EVERY SIX hours AS NEEDED FOR nausea (Patient not taking: Reported on 06/14/2022)   rosuvastatin (CRESTOR) 20 MG tablet TAKE ONE TABLET BY MOUTH EVERY DAY   sertraline (ZOLOFT) 100 MG tablet Take 1 tablet (100 mg total) by mouth daily.   Tocilizumab (ACTEMRA IV) Inject 0.4 mg into the vein every 30 (thirty) days.   No facility-administered encounter medications on file as of 08/26/2022.    Allergies (verified) Steri-strip compound benzoin [benzoin compound], Penicillins, Pravastatin, Simvastatin, Hydrocodone-acetaminophen, and Hydroxychloroquine sulfate   History: Past Medical History:  Diagnosis Date   Acute medial meniscus tear of right knee    Adrenal insufficiency (Addison's disease) ([place] 02/2021   Anemia of chronic disease    Mild, Hb stable consistently   Anxiety and depression    Carpal tunnel syndrome    2023, bilateral   Chest pain 10/2017   +Cardiac CT.  Myocardial perfusion imaging NORMAL, EF>65%   Collagen vascular disease ([place]    Diabetes mellitus 1995   Managed by Dr. GCruzita Lederer(Endo)   GERD (gastroesophageal reflux disease) per pt watches diet and take tums as needed   with hx of prox esoph stricture + dilation procedure   H/O hiatal hernia    slightly larger ("moderate" size) on CT angio done Raven [name] r/o PE 10/2017. noted on egd again 03/2021   History of adrenal insufficiency    in 2022 when she tried coming off her chronic daily prednisone for her RA   Hyperlipidemia    Hypertension    Interstitial cystitis    Lichen sclerosus of female genitalia    Migraine    Osteoarthritis, multiple sites    Palpitations 10/2017   3 wk event monitor: symptoms correlate with sinus rhythm with PACs, at times runs of PACs up Raven [name] 5 beats.   Pseudogout of knee, left    colchicine helpful   Psoriasis    Seasonal allergies    Seropositive rheumatoid arthritis ([place]    Hands, knees,  jaw, elbow: responding Raven [name] Simponi as of 09/29/15 rheum f/u.  Waning effectiveness on 03/2016 f/u so pt switched Raven [name] Orencia injections.  Doing well on chronic low-dose prednisone therapy+ methotrexate +orencia as of 01/2019, 08/2019, 11/2019   Past Surgical History:  Procedure Laterality Date   ABDOMINAL HYSTERECTOMY  1991   for fibroids; ovaries still in   BIOPSY  03/17/2021   Procedure: BIOPSY;  Surgeon: BOtis Brace MD;  Location: [facility];  Service: Gastroenterology;;   BREAST BIOPSY  2001; 02/12/16   fibrocystic breast  disease in 2001 and again in 02/2016 (+scar from prev bx site w/calcifications).   CARDIOVASCULAR STRESS TEST  11/01/2017   Myocard perf imaging: NORMAL (EF >60-65%).  12/30/21 normal   CARPAL TUNNEL RELEASE Right 2024   COLONOSCOPY  09/12/2013; 02/25/20   Normal 2015 and 2021: Recall 5 yrs ([facility], Dr. Penelope Coop) due Raven [name] [place] of colon polyps.   CYSTOSCOPY  2004   for chronic UTI   DEXA  10/2016   Bone density normal (T-score 0.0)   ESOPHAGOGASTRODUODENOSCOPY  04/19/2011   Nl except antral gastritis: bx = mild chron gastritis (H pyloria neg)    ESOPHAGOGASTRODUODENOSCOPY (EGD) WITH PROPOFOL N/A 03/27/2020   Esoph stenosis-->dilated.  Mod size hiatal hernia ([place] [facility]). Procedure: ESOPHAGOGASTRODUODENOSCOPY (EGD) WITH PROPOFOL ;  Surgeon: Wonda Horner, MD;  Location: [facility];  Service: Endoscopy;  Laterality: N/A;   ESOPHAGOGASTRODUODENOSCOPY (EGD) WITH PROPOFOL N/A 03/17/2021   Esoph stricture->dilated.  Large hiatal hernia. Also +esoph candidiasis, bx inflammation.  Procedure: ESOPHAGOGASTRODUODENOSCOPY (EGD) WITH PROPOFOL;  Surgeon: Otis Brace, MD;  Location: [facility];  Service: Gastroenterology;  Laterality: N/A;   EVENT MONITOR  10/2017   3 wk-->Symptoms correlate with sinus rhythm with PACs, at times runs of PACs up Raven [name] 5 beats.   GALLBLADDER SURGERY  2019   intraarticular steroid injection  2013   3 R knee & L X 1; Dr Rushie Nyhan   KNEE  ARTHROCENTESIS  2013   R knee x 3 & L X 1   KNEE ARTHROSCOPY  10/21/2011   Procedure: ARTHROSCOPY KNEE;  Surgeon: Tobi Bastos, MD;  Location[facility];  Service: Orthopedics;  Laterality: Right;  WITH MEDIAL and lateral shaving of femoral chondyl  with microfracture technique of lateral and medial femoral chondyl suprapatellar synovectomy   LAPAROSCOPIC CHOLECYSTECTOMY  01/2018   myocardial perf imaging     Normal 12/2021   SAVORY DILATION N/A 03/27/2020   Procedure: SAVORY DILATION;  Surgeon: Wonda Horner, MD;  [facility]: [facility];  Service: Endoscopy;  Laterality: N/A;   TOTAL KNEE ARTHROPLASTY  04/12/2012   RIGHT  Procedure: TOTAL KNEE ARTHROPLASTY;  Surgeon: Tobi Bastos, MD;  Location: [facility];  Service: Orthopedics;  Laterality: Right;  Right Total Knee Arthroplasty   TOTAL KNEE ARTHROPLASTY Left 05/26/2021   Procedure: TOTAL KNEE ARTHROPLASTY;  Surgeon: Paralee Cancel, MD;  Location: [facility];  Service: Orthopedics;  Laterality: Left;   TRANSTHORACIC ECHOCARDIOGRAM  11/13/2021   EF 65-70%, mild dec RV fxn, grd I DD, o/w normal   TUBAL LIGATION  1981   Family History  Problem Relation Age of Onset   Diabetes Mother    Stroke Mother 52   Osteoarthritis Mother    Stroke Brother 6   Heart disease Father        CABG   Lung cancer Father    Prostate cancer Father    Pancreatic cancer Father    Pancreatic cancer Paternal Grandfather    Bipolar disorder Daughter    Anxiety disorder Son    Arthritis Maternal Grandmother        rheumatoid   Heart attack Brother 48       smoker   Social History   Socioeconomic History   Marital status: Married    Spouse name: Not on file   Number of children: Not on file   Years of education: Not on file   Highest education level: Not on file  Occupational History   Not on file  Tobacco Use  Smoking status: Never   Smokeless tobacco: Never  Vaping Use   Vaping Use: Never used  Substance and Sexual Activity    Alcohol use: No   Drug use: No   Sexual activity: Not on file  Other Topics Concern   Not on file  Social History Narrative   Married, 3 children (2 in [place], 1 in [place]).  4 grandchildren.   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   2 cups of coffee/day x 3 months   Regular exercise- no-due Raven [name] arthritis.   Religion affecting care, "it allows stress management:"   Social Determinants of Health   Financial Resource Strain: Low Risk  (08/26/2022)   Overall Financial Resource Strain (CARDIA)    Difficulty of Paying Living Expenses: Not hard at all  Food Insecurity: No Food Insecurity (08/26/2022)   Hunger Vital Sign    Worried About Running Out of Food in the Last Year: Never true    Ran Out of Food in the Last Year: Never true  Transportation Needs: No Transportation Needs (08/26/2022)   PRAPARE - Hydrologist (Medical): No    Lack of Transportation (Non-Medical): No  Physical Activity: Insufficiently Active (08/26/2022)   Exercise Vital Sign    Days of Exercise per Week: 1 day    Minutes of Exercise per Session: 10 min  Stress: No Stress Concern Present (08/26/2022)   [place]    Feeling of Stress : Only a little  Social Connections: Socially Integrated (08/26/2022)   Social Connection and Isolation Panel [NHANES]    Frequency of Communication with Friends and Family: Twice a week    Frequency of Social Gatherings with Friends and Family: Twice a week    Attends Religious Services: More than 4 times per year    Active Member of [employer] or Organizations: Yes    Attends Music therapist: More than 4 times per year    Marital Status: Married    Tobacco Counseling Counseling given: Not Answered   Clinical Intake:  Pre-visit preparation completed: No  Pain : 0-10 Pain Score: 3      Nutritional Status: BMI of 19-24   Normal Nutritional Risks: None Diabetes: Yes CBG done?: No Did pt. bring in CBG monitor from home?: No  How often do you need Raven [name] have someone help you when you read instructions, pamphlets, or other written materials from your doctor or pharmacy?: 1 - Never  Diabetic?yes  Interpreter Needed?: No      Activities of Daily Living    08/26/2022    2:06 PM  In your present state of health, do you have any difficulty performing the following activities:  Hearing? 1  Vision? 0  Difficulty concentrating or making decisions? 0  Walking or climbing stairs? 1  Dressing or bathing? 0  Doing errands, shopping? 0  Preparing Food and eating ? N  Using the Toilet? N  In the past six months, have you accidently leaked urine? Y  Do you have problems with loss of bowel control? Y  Managing your Medications? N  Managing your Finances? N  Housekeeping or managing your Housekeeping? Y    Patient Care Team: Tammi Sou, MD as PCP - General (Family Medicine) Harl Bowie Alphonse Guild, MD as PCP - Cardiology (Cardiology) Gavin Pound, MD as Consulting Physician (Rheumatology) Philemon Kingdom, MD as Consulting Physician (Endocrinology) Quentin Mulling, MD as Referring Physician (Neurology) Otis Brace, MD  as Consulting Physician (Gastroenterology) Paralee Cancel, MD as Consulting Physician (Orthopedic Surgery) Iran Planas, MD as Consulting Physician (Orthopedic Surgery)  Indicate any recent [place] you may have received from other than [facility] providers in the past year (date may be approximate).     Assessment:   This is a routine wellness examination for Raven Raven [name].  Hearing/Vision screen No results found.  Dietary issues and exercise activities discussed: Current Exercise Habits: Home exercise routine, Time (Minutes): 10, Frequency (Times/Week): 1, Weekly Exercise (Minutes/Week): 10   Goals Addressed             This Visit's Progress    Quality of Life Maintained        Evidence-based guidance:  Assess patient's thoughts about quality of life, goals and expectations, and dissatisfaction or desire Raven [name] improve.  Identify issues of primary importance such as mental health, illness, exercise tolerance, pain, sexual function and intimacy, cognitive change, social isolation, finances and relationships.  Assess and monitor for signs/symptoms of psychosocial concerns, especially depression or ideations regarding harm Raven [name] others or self; provide or refer for mental health services as needed.  Identify sensory issues that impact quality of life such as hearing loss, vision deficit; strategize ways Raven [name] maintain or improve hearing, vision.  Promote access Raven [name] services in the community Raven [name] support independence such as support groups, home visiting programs, financial assistance, handicapped parking tags, durable medical equipment and emergency responder.  Promote activities Raven [name] decrease social isolation such as group support or social, leisure and recreational activities, employment, use of social media; consider safety concerns about being out of home for activities.  Provide patient an opportunity Raven [name] share by storytelling or a "life review" Raven [name] give positive meaning Raven [name] life and Raven [name] assist with coping and negative experiences.  Encourage patient Raven [name] tap into hope Raven [name] improve sense of self.  Counsel based on prognosis and as early as possible about end-of-life and palliative care; consider referral Raven [name] palliative care provider.  Advocate for the development of palliative care plan that may include avoidance of unnecessary testing and intervention, symptom control, discontinuation of medications, hospice and organ donation.  Counsel as early as possible those with life-limiting chronic disease about palliative care; consider referral Raven [name] palliative care provider.  Advocate for the development of palliative care plan.   Notes:       Depression Screen    08/26/2022    2:05 PM 11/02/2021     9:38 AM 08/04/2021   10:58 AM 09/22/2020    9:06 AM 11/09/2019    9:39 AM 02/16/2019    8:56 AM 08/18/2018    8:49 AM  PHQ 2/9 Scores  PHQ - 2 Score 1 2 0 0 3 4 2  $ PHQ- 9 Score   2 1 8 7 7    $ Fall Risk    08/26/2022    2:05 PM 06/14/2022    9:52 AM 11/02/2021    9:38 AM 08/01/2020    2:02 PM 08/17/2017    8:57 AM  Fall Risk   Falls in the past year? 1 1 0 0 No  Number falls in past yr: 0 0 0 0   Injury with Fall? 0 0 0 0   Risk for fall due Raven [name] : History of fall(s) Impaired vision     Follow up Falls evaluation completed Falls evaluation completed Falls evaluation completed Falls evaluation completed     [place]:  Any stairs in or around the home? Yes  If  so, are there any without handrails? No  Home free of loose throw rugs in walkways, pet beds, electrical cords, etc? No  Adequate lighting in your home Raven [name] reduce risk of falls? Yes   ASSISTIVE DEVICES UTILIZED Raven [name] PREVENT FALLS:  Life alert? No  Use of a cane, walker or w/c? No  Grab bars in the bathroom? Yes  Shower chair or bench in shower? Yes  Elevated toilet seat or a handicapped toilet? No   TIMED UP AND GO:  Was the test performed? Yes .  Length of time Raven [name] ambulate 10 feet: 6 sec.   Gait steady and fast without use of assistive device  Cognitive Function:        08/26/2022    2:19 PM  6CIT Screen  What Year? 0 points  What month? 0 points  What time? 0 points  Count back from 20 0 points  Months in reverse 0 points  Repeat phrase 4 points  Total Score 4 points    Immunizations Immunization History  Administered Date(s) Administered   Influenza Whole 05/08/2008, 03/23/2010, 06/11/2012   Influenza,inj,Quad PF,6+ Mos 04/29/2014, 07/15/2015, 04/19/2017, 02/24/2018, 03/21/2020   Influenza-Unspecified 04/11/2013, 04/11/2016   PFIZER Comirnaty(Gray Top)Covid-19 Tri-Sucrose Vaccine 11/17/2020   PFIZER(Purple Top)SARS-COV-2 Vaccination 09/21/2019, 10/17/2019, 04/17/2020    Pfizer Covid-19 Vaccine Bivalent Booster 29yr & up 04/30/2021   Pneumococcal Conjugate-13 09/22/2020   Pneumococcal Polysaccharide-23 01/14/2016   Tdap 04/21/2015   Zoster Recombinat (Shingrix) 02/24/2018, 08/18/2018    TDAP status: Up Raven [name] date  Flu Vaccine status: Due, Education has been provided regarding the importance of this vaccine. Advised may receive this vaccine at local pharmacy or Health Dept. Aware Raven [name] provide a copy of the vaccination record if obtained from local pharmacy or Health Dept. Verbalized acceptance and understanding.  Pneumococcal vaccine status: Up Raven [name] date  Covid-19 vaccine status: Completed vaccines  Qualifies for Shingles Vaccine? Yes   Zostavax completed No   Shingrix Completed?: Yes  Screening Tests Health Maintenance  Topic Date Due   Diabetic kidney evaluation - Urine ACR  11/24/2014   MAMMOGRAM  02/08/2018   OPHTHALMOLOGY EXAM  04/26/2018   COVID-19 Vaccine (6 - 2023-24 season) 03/12/2022   INFLUENZA VACCINE  10/10/2022 (Originally 02/09/2022)   HEMOGLOBIN A1C  01/07/2023   FOOT EXAM  03/05/2023   Diabetic kidney evaluation - eGFR measurement  06/15/2023   Medicare Annual Wellness (AWV)  08/27/2023   COLONOSCOPY (Pts 45-433yrInsurance coverage will need Raven [name] be confirmed)  02/24/2025   DTaP/Tdap/Td (2 - Td or Tdap) 04/20/2025   Pneumonia Vaccine 6557Years old (3 of 3 - PPSV23 or PCV20) 09/22/2025   DEXA SCAN  Completed   Hepatitis C Screening  Completed   Zoster Vaccines- Shingrix  Completed   HPV VACCINES  Aged Out    Health Maintenance  Health Maintenance Due  Topic Date Due   Diabetic kidney evaluation - Urine ACR  11/24/2014   MAMMOGRAM  02/08/2018   OPHTHALMOLOGY EXAM  04/26/2018   COVID-19 Vaccine (6 - 2023-24 season) 03/12/2022    Colorectal cancer screening: Type of screening: Colonoscopy. Completed 02/25/20. Repeat every 5 years  Mammogram status: Ordered 08/24/22. Pt provided with contact info and advised Raven [name] call Raven [name] schedule  appt.   Bone Density status: Completed 11/09/2016. Results reflect: Bone density results: NORMAL. Repeat every 2 years.  Lung Cancer Screening: (Low Dose CT Chest recommended if Age 68-80ears, 30 pack-year currently smoking OR have quit w/in 15years.) does not qualify.   Lung Cancer  Screening Referral: n/a  Additional Screening:  Hepatitis C Screening: does qualify; Completed 11/09/19  Vision Screening: Recommended annual ophthalmology exams for early detection of glaucoma and other disorders of the eye. Is the patient up Raven [name] date with their annual eye exam?  No  Who is the provider or what is the name of the office in which the patient attends annual eye exams? N/A If pt is not established with a provider, would they like Raven [name] be referred Raven [name] a provider Raven [name] establish care? No .   Dental Screening: Recommended annual dental exams for proper oral hygiene  Community Resource Referral / Chronic Care Management: CRR required this visit?  No   CCM required this visit?  No      Plan:     I have personally reviewed and noted the following in the patient's chart:   Medical and social history Use of alcohol, tobacco or illicit drugs  Current medications and supplements including opioid prescriptions. Patient is not currently taking opioid prescriptions. Functional ability and status Nutritional status Physical activity Advanced directives List of other physicians Hospitalizations, surgeries, and ER visits in previous 12 months Vitals Screenings Raven [name] include cognitive, depression, and falls Referrals and appointments  In addition, I have reviewed and discussed with patient certain preventive protocols, quality metrics, and best practice recommendations. A written personalized care plan for preventive services as well as general preventive health recommendations were provided Raven [name] patient.     Beatrix Fetters, [place]   08/26/2022   Nurse Notes: Non-Face Raven [name] Face or Face Raven [name] Face 15 minute  visit Encounter    Raven Raven [name] , Thank you for taking time Raven [name] come for your Medicare Wellness Visit. I appreciate your ongoing commitment Raven [name] your health goals. Please review the following plan we discussed and let me know if I can assist you in the future.   These are the goals we discussed:  Goals      Quality of Life Maintained     Evidence-based guidance:  Assess patient's thoughts about quality of life, goals and expectations, and dissatisfaction or desire Raven [name] improve.  Identify issues of primary importance such as mental health, illness, exercise tolerance, pain, sexual function and intimacy, cognitive change, social isolation, finances and relationships.  Assess and monitor for signs/symptoms of psychosocial concerns, especially depression or ideations regarding harm Raven [name] others or self; provide or refer for mental health services as needed.  Identify sensory issues that impact quality of life such as hearing loss, vision deficit; strategize ways Raven [name] maintain or improve hearing, vision.  Promote access Raven [name] services in the community Raven [name] support independence such as support groups, home visiting programs, financial assistance, handicapped parking tags, durable medical equipment and emergency responder.  Promote activities Raven [name] decrease social isolation such as group support or social, leisure and recreational activities, employment, use of social media; consider safety concerns about being out of home for activities.  Provide patient an opportunity Raven [name] share by storytelling or a "life review" Raven [name] give positive meaning Raven [name] life and Raven [name] assist with coping and negative experiences.  Encourage patient Raven [name] tap into hope Raven [name] improve sense of self.  Counsel based on prognosis and as early as possible about end-of-life and palliative care; consider referral Raven [name] palliative care provider.  Advocate for the development of palliative care plan that may include avoidance of unnecessary testing and intervention,  symptom control, discontinuation of medications, hospice and organ donation.  Counsel as early as possible those with life-limiting chronic disease about palliative care; consider  referral Raven [name] palliative care provider.  Advocate for the development of palliative care plan.   Notes:         This is a list of the screening recommended for you and due dates:  Health Maintenance  Topic Date Due   Yearly kidney health urinalysis for diabetes  11/24/2014   Mammogram  02/08/2018   Eye exam for diabetics  04/26/2018   COVID-19 Vaccine (6 - 2023-24 season) 03/12/2022   Flu Shot  10/10/2022*   Hemoglobin A1C  01/07/2023   Complete foot exam   03/05/2023   Yearly kidney function blood test for diabetes  06/15/2023   Medicare Annual Wellness Visit  08/27/2023   Colon Cancer Screening  02/24/2025   DTaP/Tdap/Td vaccine (2 - Td or Tdap) 04/20/2025   Pneumonia Vaccine (3 of 3 - PPSV23 or PCV20) 09/22/2025   DEXA scan (bone density measurement)  Completed   Hepatitis C Screening: USPSTF Recommendation [name] screen - Ages 41-79 yo.  Completed   Zoster (Shingles) Vaccine  Completed   HPV Vaccine  Aged Out  *Topic was postponed. The date shown is not the original due date.

## 2022-08-26 NOTE — Addendum Note (Signed)
Addended by: Beatrix Fetters on: 08/26/2022 04:03 PM   Modules accepted: Orders

## 2022-08-27 DIAGNOSIS — R519 Headache, unspecified: Secondary | ICD-10-CM | POA: Diagnosis not present

## 2022-08-27 DIAGNOSIS — U071 COVID-19: Secondary | ICD-10-CM | POA: Diagnosis not present

## 2022-08-27 DIAGNOSIS — J988 Other specified respiratory disorders: Secondary | ICD-10-CM

## 2022-08-27 MED ORDER — KETOROLAC TROMETHAMINE 60 MG/2ML IM SOLN
60.0000 mg | Freq: Once | INTRAMUSCULAR | Status: AC
  Administered 2022-08-27: 60 mg via INTRAMUSCULAR

## 2022-08-27 NOTE — Addendum Note (Signed)
Addended by: Beatrix Fetters on: 08/27/2022 01:40 PM   Modules accepted: Orders

## 2022-09-08 ENCOUNTER — Other Ambulatory Visit: Payer: Self-pay | Admitting: Family Medicine

## 2022-09-08 NOTE — Telephone Encounter (Signed)
Requesting: adderall  Contract: 06/14/22 UDS: 11/02/21 Last Visit:  08/26/22 Next Visit: Last Refill:  Please Advise. Med pending

## 2022-09-15 DIAGNOSIS — Z79899 Other long term (current) drug therapy: Secondary | ICD-10-CM | POA: Diagnosis not present

## 2022-09-15 DIAGNOSIS — M0589 Other rheumatoid arthritis with rheumatoid factor of multiple sites: Secondary | ICD-10-CM | POA: Diagnosis not present

## 2022-10-13 DIAGNOSIS — M0589 Other rheumatoid arthritis with rheumatoid factor of multiple sites: Secondary | ICD-10-CM | POA: Diagnosis not present

## 2022-10-14 ENCOUNTER — Encounter: Payer: Self-pay | Admitting: Family Medicine

## 2022-10-14 ENCOUNTER — Ambulatory Visit (INDEPENDENT_AMBULATORY_CARE_PROVIDER_SITE_OTHER): Payer: Medicare (Managed Care) | Admitting: Family Medicine

## 2022-10-14 VITALS — BP 126/74 | HR 75 | Temp 98.1°F | Ht 68.5 in | Wt 166.4 lb

## 2022-10-14 DIAGNOSIS — I10 Essential (primary) hypertension: Secondary | ICD-10-CM | POA: Diagnosis not present

## 2022-10-14 DIAGNOSIS — Z1231 Encounter for screening mammogram for malignant neoplasm of breast: Secondary | ICD-10-CM | POA: Diagnosis not present

## 2022-10-14 DIAGNOSIS — L408 Other psoriasis: Secondary | ICD-10-CM | POA: Diagnosis not present

## 2022-10-14 DIAGNOSIS — E2839 Other primary ovarian failure: Secondary | ICD-10-CM | POA: Diagnosis not present

## 2022-10-14 DIAGNOSIS — R197 Diarrhea, unspecified: Secondary | ICD-10-CM | POA: Diagnosis not present

## 2022-10-14 DIAGNOSIS — M0589 Other rheumatoid arthritis with rheumatoid factor of multiple sites: Secondary | ICD-10-CM | POA: Diagnosis not present

## 2022-10-14 DIAGNOSIS — Z0000 Encounter for general adult medical examination without abnormal findings: Secondary | ICD-10-CM

## 2022-10-14 DIAGNOSIS — M1120 Other chondrocalcinosis, unspecified site: Secondary | ICD-10-CM | POA: Diagnosis not present

## 2022-10-14 DIAGNOSIS — Z6825 Body mass index (BMI) 25.0-25.9, adult: Secondary | ICD-10-CM | POA: Diagnosis not present

## 2022-10-14 DIAGNOSIS — Z79899 Other long term (current) drug therapy: Secondary | ICD-10-CM | POA: Diagnosis not present

## 2022-10-14 DIAGNOSIS — F988 Other specified behavioral and emotional disorders with onset usually occurring in childhood and adolescence: Secondary | ICD-10-CM

## 2022-10-14 DIAGNOSIS — E663 Overweight: Secondary | ICD-10-CM | POA: Diagnosis not present

## 2022-10-14 DIAGNOSIS — M1991 Primary osteoarthritis, unspecified site: Secondary | ICD-10-CM | POA: Diagnosis not present

## 2022-10-14 MED ORDER — PROMETHAZINE HCL 12.5 MG PO TABS: ORAL_TABLET | ORAL | 2 refills | Status: DC

## 2022-10-14 MED ORDER — AMPHETAMINE-DEXTROAMPHET ER 15 MG PO CP24: 15.0000 mg | ORAL_CAPSULE | Freq: Every morning | ORAL | 0 refills | Status: DC

## 2022-10-14 NOTE — Progress Notes (Signed)
Office Note 10/14/2022  CC:  Chief Complaint  Patient presents with   Medical Management of Chronic Issues    Pt is fasting    HPI:  Patient is a 67 y.o. female who is here for annual health maintenance exam and 45-month follow-up hypertension, hyperlipidemia, and adult ADD. A/P as of last visit: "1 hypertension, variable control lately.  A little bit on the low side now.  She will go back to half of the lisinopril 20 mg tab today.  Continue 12.5 mg Lopressor twice daily. Electrolytes and creatinine today.   2.  Hyperlipidemia, doing well on rosuvastatin 20 mg a day. Lipid panel today.   #3 adult ADD. Doing well on Adderall XR 15 mg a day. Controlled substance contract and urine drug screen are up-to-date.   #4 migraine syndrome. Her migraines start with dizziness and the use of diazepam as needed helps this. Refilled today."  INTERIM HX: Raven [name] feels well. She is on prednisone daily and this has been helping her arthritis very well.  Followed by Dr. Trudie Reed. She got some labs done on March 7 and she showed these to me on her phone today: Normal CBC and complete metabolic panel.  Pt states all is going well with the med at current dosing: much improved focus, concentration, task completion.  Less frustration, better multitasking, less impulsivity and restlessness.  Mood is stable. No side effects from the medication.   PMP AWARE reviewed today: most recent rx for Adderall was filled 09/09/2022, # 27, rx by me. No red flags.  Past Medical History:  Diagnosis Date   Acute medial meniscus tear of right knee    Adrenal insufficiency (Addison's disease) 02/2021   Anemia of chronic disease    Mild, Hb stable consistently   Anxiety and depression    Carpal tunnel syndrome    2023, bilateral   Chest pain 10/2017   +Cardiac CT.  Myocardial perfusion imaging NORMAL, EF>65%   Collagen vascular disease    Diabetes mellitus 1995   Managed by Dr. Cruzita Lederer (Endo)   GERD  (gastroesophageal reflux disease) per pt watches diet and take tums as needed   with hx of prox esoph stricture + dilation procedure   H/O hiatal hernia    slightly larger ("moderate" size) on CT angio done to r/o PE 10/2017. noted on egd again 03/2021   History of adrenal insufficiency    in 2022 when she tried coming off her chronic daily prednisone for her RA   Hyperlipidemia    Hypertension    Interstitial cystitis    Lichen sclerosus of female genitalia    Migraine    Osteoarthritis, multiple sites    Palpitations 10/2017   3 wk event monitor: symptoms correlate with sinus rhythm with PACs, at times runs of PACs up to 5 beats.   Pseudogout of knee, left    colchicine helpful   Psoriasis    Seasonal allergies    Seropositive rheumatoid arthritis    Hands, knees, jaw, elbow: responding to Simponi as of 09/29/15 rheum f/u.  Waning effectiveness on 03/2016 f/u so pt switched to Orencia injections.  Doing well on chronic low-dose prednisone therapy+ methotrexate +orencia as of 01/2019, 08/2019, 11/2019    Past Surgical History:  Procedure Laterality Date   ABDOMINAL HYSTERECTOMY  1991   for fibroids; ovaries still in   BIOPSY  03/17/2021   Procedure: BIOPSY;  Surgeon: Otis Brace, MD;  Location: [facility];  Service: Gastroenterology;;   BREAST BIOPSY  2001;  02/12/16   fibrocystic breast disease in 2001 and again in 02/2016 (+scar from prev bx site w/calcifications).   CARDIOVASCULAR STRESS TEST  11/01/2017   Myocard perf imaging: NORMAL (EF >60-65%).  12/30/21 normal   CARPAL TUNNEL RELEASE Right 2024   COLONOSCOPY  09/12/2013; 02/25/20   Normal 2015 and 2021: Recall 5 yrs ([facility], Dr. Penelope Coop) due to [place] of colon polyps.   CYSTOSCOPY  2004   for chronic UTI   DEXA  10/2016   Bone density normal (T-score 0.0)   ESOPHAGOGASTRODUODENOSCOPY  04/19/2011   Nl except antral gastritis: bx = mild chron gastritis (H pyloria neg)    ESOPHAGOGASTRODUODENOSCOPY (EGD) WITH PROPOFOL N/A  03/27/2020   Esoph stenosis-->dilated.  Mod size hiatal hernia ([facility] Spec). Procedure: ESOPHAGOGASTRODUODENOSCOPY (EGD) WITH PROPOFOL ;  Surgeon: Wonda Horner, MD;  Location: [facility];  Service: Endoscopy;  Laterality: N/A;   ESOPHAGOGASTRODUODENOSCOPY (EGD) WITH PROPOFOL N/A 03/17/2021   Esoph stricture->dilated.  Large hiatal hernia. Also +esoph candidiasis, bx inflammation.  Procedure: ESOPHAGOGASTRODUODENOSCOPY (EGD) WITH PROPOFOL;  Surgeon: Otis Brace, MD;  Location: [facility];  Service: Gastroenterology;  Laterality: N/A;   EVENT MONITOR  10/2017   3 wk-->Symptoms correlate with sinus rhythm with PACs, at times runs of PACs up to 5 beats.   GALLBLADDER SURGERY  2019   intraarticular steroid injection  2013   3 R knee & L X 1; Dr Rushie Nyhan   KNEE ARTHROCENTESIS  2013   R knee x 3 & L X 1   KNEE ARTHROSCOPY  10/21/2011   Procedure: ARTHROSCOPY KNEE;  Surgeon: Tobi Bastos, MD;  Location: [facility];  Service: Orthopedics;  Laterality: Right;  WITH MEDIAL and lateral shaving of femoral chondyl  with microfracture technique of lateral and medial femoral chondyl suprapatellar synovectomy   LAPAROSCOPIC CHOLECYSTECTOMY  01/2018   myocardial perf imaging     Normal 12/2021   SAVORY DILATION N/A 03/27/2020   Procedure: SAVORY DILATION;  Surgeon: Wonda Horner, MD;  Location: [facility];  Service: Endoscopy;  Laterality: N/A;   TOTAL KNEE ARTHROPLASTY  04/12/2012   RIGHT  Procedure: TOTAL KNEE ARTHROPLASTY;  Surgeon: Tobi Bastos, MD;  Location: [facility];  Service: Orthopedics;  Laterality: Right;  Right Total Knee Arthroplasty   TOTAL KNEE ARTHROPLASTY Left 05/26/2021   Procedure: TOTAL KNEE ARTHROPLASTY;  Surgeon: Paralee Cancel, MD;  Location: [facility];  Service: Orthopedics;  Laterality: Left;   TRANSTHORACIC ECHOCARDIOGRAM  11/13/2021   EF 65-70%, mild dec RV fxn, grd I DD, o/w normal   TUBAL LIGATION  1981    Family History  Problem Relation Age  of Onset   Diabetes Mother    Stroke Mother 54   Osteoarthritis Mother    Stroke Brother 33   Heart disease Father        CABG   Lung cancer Father    Prostate cancer Father    Pancreatic cancer Father    Pancreatic cancer Paternal Grandfather    Bipolar disorder Daughter    Anxiety disorder Son    Arthritis Maternal Grandmother        rheumatoid   Heart attack Brother 50       smoker    Social History   Socioeconomic History   Marital status: Married    Spouse name: Not on file   Number of children: Not on file   Years of education: Not on file   Highest education level: Not on file  Occupational History  Not on file  Tobacco Use   Smoking status: Never   Smokeless tobacco: Never  Vaping Use   Vaping Use: Never used  Substance and Sexual Activity   Alcohol use: No   Drug use: No   Sexual activity: Not on file  Other Topics Concern   Not on file  Social History Narrative   Married, 3 children (2 in [place], 1 in [place]).  4 grandchildren.   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   2 cups of coffee/day x 3 months   Regular exercise- no-due to arthritis.   Religion affecting care, "it allows stress management:"   Social Determinants of Health   Financial Resource Strain: Low Risk  (08/26/2022)   Overall Financial Resource Strain (CARDIA)    Difficulty of Paying Living Expenses: Not hard at all  Food Insecurity: No Food Insecurity (08/26/2022)   Hunger Vital Sign    Worried About Running Out of Food in the Last Year: Never true    Ran Out of Food in the Last Year: Never true  Transportation Needs: No Transportation Needs (08/26/2022)   PRAPARE - Hydrologist (Medical): No    Lack of Transportation (Non-Medical): No  Physical Activity: Insufficiently Active (08/26/2022)   Exercise Vital Sign    Days of Exercise per Week: 1 day    Minutes of Exercise per Session: 10 min  Stress: No Stress Concern  Present (08/26/2022)   [place]    Feeling of Stress : Only a little  Social Connections: Socially Integrated (08/26/2022)   Social Connection and Isolation Panel [NHANES]    Frequency of Communication with Friends and Family: Twice a week    Frequency of Social Gatherings with Friends and Family: Twice a week    Attends Religious Services: More than 4 times per year    Active Member of [employer] or Organizations: Yes    Attends Music therapist: More than 4 times per year    Marital Status: Married  Human resources officer Violence: Not At Risk (08/26/2022)   Humiliation, Afraid, Rape, and Kick questionnaire    Fear of Current or Ex-Partner: No    Emotionally Abused: No    Physically Abused: No    Sexually Abused: No    Outpatient Medications Prior to Visit  Medication Sig Dispense Refill   aspirin EC 81 MG tablet Take 81 mg by mouth daily. Swallow whole.     cetirizine (ZYRTEC) 10 MG tablet Take 10 mg by mouth daily.     CYANOCOBALAMIN PO Take 1 tablet by mouth daily.     diazepam (VALIUM) 5 MG tablet TAKE ONE TABLET BY MOUTH EVERY TWELVE HOURS AS NEEDED FOR MIGRAINE HEADACHES 60 tablet 5   Ferrous Sulfate (IRON PO) Take 1 tablet by mouth daily.     FOLIC ACID PO Take 2 mg by mouth at bedtime.     glipiZIDE (GLUCOTROL) 5 MG tablet Take 0.5-1 tablets (2.5-5 mg total) by mouth 2 (two) times daily before a meal. 180 tablet 3   insulin glargine (LANTUS SOLOSTAR) 100 UNIT/ML Solostar Pen Inject 8-10 Units into the skin at bedtime. 15 mL 3   Insulin Pen Needle 32G X 4 MM MISC Use 1x a day 100 each 3   leflunomide (ARAVA) 20 MG tablet Take 20 mg by mouth daily.     lisinopril (ZESTRIL) 20 MG tablet TAKE ONE-HALF TABLET BY MOUTH EVERY DAY 45 tablet  3   metFORMIN (GLUCOPHAGE-XR) 500 MG 24 hr tablet Take 2 tablets (1,000 mg total) by mouth 2 (two) times daily with a meal. 360 tablet 3   methotrexate 50 MG/2ML injection Inject  22.5 mg into the muscle every Monday.     metoprolol tartrate (LOPRESSOR) 25 MG tablet TAKE 1/2 TABLET (12.5mg  total) BY MOUTH TWICE DAILY 90 tablet 3   omeprazole (PRILOSEC) 40 MG capsule Take 1 capsule (40 mg total) by mouth 2 (two) times daily. 180 capsule 3   Polyvinyl Alcohol-Povidone (REFRESH OP) Place 1 drop into both eyes 2 (two) times daily.     predniSONE (DELTASONE) 5 MG tablet Take 1-2 tablets (5-10 mg total) by mouth daily. 180 tablet 3   primidone (MYSOLINE) 50 MG tablet Take 50-100 mg by mouth See admin instructions. Take 50 mg by mouth in the morning and 100 mg by mouth in the evening.     rosuvastatin (CRESTOR) 20 MG tablet TAKE ONE TABLET BY MOUTH EVERY DAY 90 tablet 3   sertraline (ZOLOFT) 100 MG tablet Take 1 tablet (100 mg total) by mouth daily. 90 tablet 1   promethazine (PHENERGAN) 12.5 MG tablet TAKE 1-2 TABLETS BY MOUTH EVERY SIX hours AS NEEDED FOR nausea 30 tablet 2   acetaminophen (TYLENOL) 500 MG tablet Take 1,000 mg by mouth every 6 (six) hours as needed for moderate pain. (Patient not taking: Reported on 10/14/2022)     albuterol (VENTOLIN HFA) 108 (90 Base) MCG/ACT inhaler Inhale 2 puffs into the lungs every 6 (six) hours as needed for wheezing or shortness of breath. (Patient not taking: Reported on 06/14/2022) 8 g 0   phenazopyridine (PYRIDIUM) 95 MG tablet Take 190 mg by mouth 2 (two) times daily as needed for pain. (Patient not taking: Reported on 10/14/2022)     polyethylene glycol (MIRALAX / GLYCOLAX) 17 g packet Take 17 g by mouth daily as needed for mild constipation. (Patient not taking: Reported on 06/14/2022) 14 each 0   amphetamine-dextroamphetamine (ADDERALL XR) 15 MG 24 hr capsule TAKE ONE CAPSULE BY MOUTH EVERY MORNING 30 capsule 0   Colchicine 0.6 MG CAPS Take 0.6 mg by mouth 2 (two) times daily. (Patient not taking: Reported on 10/14/2022)     docusate sodium (COLACE) 100 MG capsule Take 1 capsule (100 mg total) by mouth 2 (two) times daily. (Patient not  taking: Reported on 10/14/2022) 10 capsule 0   methocarbamol (ROBAXIN) 500 MG tablet Take 1 tablet by mouth 3 (three) times daily as needed. (Patient not taking: Reported on 10/14/2022)     Tocilizumab (ACTEMRA IV) Inject 0.4 mg into the vein every 30 (thirty) days. (Patient not taking: Reported on 10/14/2022)     No facility-administered medications prior to visit.    Allergies  Allergen Reactions   Steri-Strip Compound Benzoin [Benzoin Compound] Anaphylaxis and Itching    Blisters and itching   Penicillins Rash    Tolerated Cephalosporin 05/26/21.     Pravastatin     Myalgias   Simvastatin     Myalgias   Hydrocodone-Acetaminophen Nausea And Vomiting   Hydroxychloroquine Sulfate Rash    Review of Systems  Constitutional:  Negative for appetite change, chills, fatigue and fever.  HENT:  Negative for congestion, dental problem, ear pain and sore throat.   Eyes:  Negative for discharge, redness and visual disturbance.  Respiratory:  Negative for cough, chest tightness, shortness of breath and wheezing.   Cardiovascular:  Negative for chest pain, palpitations and leg swelling.  Gastrointestinal:  Negative for abdominal pain, blood in stool, diarrhea, nausea and vomiting.  Genitourinary:  Negative for difficulty urinating, dysuria, flank pain, frequency, hematuria and urgency.  Musculoskeletal:  Negative for arthralgias, back pain, joint swelling, myalgias and neck stiffness.  Skin:  Negative for pallor and rash.  Neurological:  Positive for dizziness (some positional vertigo with nausea lately). Negative for speech difficulty, weakness and headaches.  Hematological:  Negative for adenopathy. Does not bruise/bleed easily.  Psychiatric/Behavioral:  Negative for confusion and sleep disturbance. The patient is not nervous/anxious.     PE;    10/14/2022    8:56 AM 08/26/2022    1:59 PM 08/26/2022    1:32 PM  Vitals with BMI  Height 5' 8.5" 5' 8.5"   Weight 166 lbs 6 oz 158 lbs 10 oz 158  lbs 10 oz  BMI [id number]   Systolic [id number]  Diastolic 84 80 80  Pulse 75 83 83   Gen: Alert, well appearing.  Patient is oriented to person, place, time, and situation. AFFECT: pleasant, lucid thought and speech. ENT: Ears: EACs clear, normal epithelium.  TMs with good light reflex and landmarks bilaterally.  Eyes: no injection, icteris, swelling, or exudate.  EOMI, PERRLA. Nose: no drainage or turbinate edema/swelling.  No injection or focal lesion.  Mouth: lips without lesion/swelling.  Oral mucosa pink and moist.  Dentition intact and without obvious caries or gingival swelling.  Oropharynx without erythema, exudate, or swelling.  Neck: supple/nontender.  No LAD, mass, or TM.  Carotid pulses 2+ bilaterally, without bruits. CV: RRR, no m/r/g.   LUNGS: CTA bilat, nonlabored resps, good aeration in all lung fields. ABD: soft, NT, ND, BS normal.  No hepatospenomegaly or mass.  No bruits. EXT: no clubbing, cyanosis, or edema.  Musculoskeletal: no joint swelling, erythema, warmth, or tenderness.  ROM of all joints intact. Skin - no sores or suspicious lesions or rashes or color changes  Pertinent labs:  Lab Results  Component Value Date   TSH 1.24 02/17/2022   Lab Results  Component Value Date   WBC 7.4 02/17/2022   HGB 11.3 (L) 02/17/2022   HCT 34.7 (L) 02/17/2022   MCV 76.5 (L) 02/17/2022   PLT 327.0 02/17/2022   Lab Results  Component Value Date   IRON 30 (L) 11/02/2021   TIBC 375 11/02/2021   FERRITIN 53 11/02/2021   Lab Results  Component Value Date   CREATININE 0.62 06/14/2022   BUN 16 06/14/2022   NA 137 06/14/2022   K 4.5 06/14/2022   CL 103 06/14/2022   CO2 24 06/14/2022   Lab Results  Component Value Date   ALT 17 02/17/2022   AST 16 02/17/2022   ALKPHOS 79 02/17/2022   BILITOT 0.4 02/17/2022   Lab Results  Component Value Date   CHOL 146 06/14/2022   Lab Results  Component Value Date   HDL 51.80 06/14/2022   Lab Results  Component Value  Date   LDLCALC 66 06/14/2022   Lab Results  Component Value Date   TRIG 144.0 06/14/2022   Lab Results  Component Value Date   CHOLHDL 3 06/14/2022   Lab Results  Component Value Date   HGBA1C 6.0 (A) 07/08/2022   ASSESSMENT AND PLAN:   #1 health maintenance exam: Reviewed age and gender appropriate health maintenance issues (prudent diet, regular exercise, health risks of tobacco and excessive alcohol, use of seatbelts, fire alarms in home, use of sunscreen).  Also reviewed age and gender appropriate health screening  as well as vaccine recommendations. Vaccines: ALL UTD. Labs: CBC w/differential, c-Met, lipid panel Cervical ca screening: n/a-->pt with hx of TAH for benign dx. Breast ca screening: mamm overdue-->ordered Colon ca screening: recall 2026. Osteoporosis screening: due for DEXA->ordered  2 hypertension, well-controlled.  Continue lisinopril 20 mg tab, one half tab daily and Lopressor one half of the 25 mg tab twice daily. Electrolytes and kidney function normal on rheumatology labs 1 month ago.  Will obtain these lab records.  3.  Hypercholesterolemia, excellent lipid panel 4 months ago. Plan repeat at next follow-up in 75mo. Continue rosuvastatin 20 mg/day.  For adult ADD. Long-term stability on Adderall XR 15 mg daily. An After Visit Summary was printed and given to the patient.  FOLLOW UP:  Return in about 3 months (around 01/13/2023) for routine chronic illness f/u.  Signed:  Crissie Sickles, MD           10/14/2022

## 2022-10-14 NOTE — Patient Instructions (Signed)

## 2022-10-22 DIAGNOSIS — G250 Essential tremor: Secondary | ICD-10-CM | POA: Diagnosis not present

## 2022-10-22 DIAGNOSIS — Z1330 Encounter for screening examination for mental health and behavioral disorders, unspecified: Secondary | ICD-10-CM | POA: Diagnosis not present

## 2022-11-09 ENCOUNTER — Ambulatory Visit (INDEPENDENT_AMBULATORY_CARE_PROVIDER_SITE_OTHER): Payer: Medicare (Managed Care) | Admitting: Internal Medicine

## 2022-11-09 ENCOUNTER — Encounter: Payer: Self-pay | Admitting: Internal Medicine

## 2022-11-09 VITALS — BP 136/80 | HR 91 | Ht 68.5 in | Wt 171.8 lb

## 2022-11-09 DIAGNOSIS — Z7984 Long term (current) use of oral hypoglycemic drugs: Secondary | ICD-10-CM | POA: Diagnosis not present

## 2022-11-09 DIAGNOSIS — Z794 Long term (current) use of insulin: Secondary | ICD-10-CM | POA: Diagnosis not present

## 2022-11-09 DIAGNOSIS — E1165 Type 2 diabetes mellitus with hyperglycemia: Secondary | ICD-10-CM | POA: Diagnosis not present

## 2022-11-09 DIAGNOSIS — E041 Nontoxic single thyroid nodule: Secondary | ICD-10-CM | POA: Diagnosis not present

## 2022-11-09 DIAGNOSIS — E2749 Other adrenocortical insufficiency: Secondary | ICD-10-CM

## 2022-11-09 DIAGNOSIS — E785 Hyperlipidemia, unspecified: Secondary | ICD-10-CM

## 2022-11-09 LAB — POCT GLYCOSYLATED HEMOGLOBIN (HGB A1C): Hemoglobin A1C: 6.1 % — AB (ref 4.0–5.6)

## 2022-11-09 MED ORDER — PREDNISONE 1 MG PO TABS: 1.0000 mg | ORAL_TABLET | Freq: Every day | ORAL | 11 refills | Status: DC

## 2022-11-09 NOTE — Progress Notes (Signed)
Patient ID: Raven [name], female   DOB: Aug 31, 1955, 67 y.o.   MRN: [id number]  HPI: Raven [name] is a 67 y.o.-year-old female, returning for f/u for DM2, dx ~1995, insulin-dependent since 11/2013, insulin-dependent, controlled, without long-term complications. Last visit 4 months ago.  Interim history: No increased urination, blurry vision, nausea, chest pain. She is on Actemra for RA. On Prednisone 10 mg now, increased 06/2022. Tried to reduce the dose to 7.5 mg daily >> fatigue, diarrhea.   Reviewed HbA1c levels: Lab Results  Component Value Date   HGBA1C 6.0 (A) 07/08/2022   HGBA1C 9.2 (H) 02/17/2022   HGBA1C 5.6 04/30/2021   She has RA >> was on Simponi (changed from Remicade) and MTX >> started Orencia and stays on MTX. On Plaquenil.  On prednisone 5 >> 10 mg daily.  Pt is on a regimen of: - Metformin ER 500 mg >> [id number] >> 1000 mg 2x a day - Glipizide 5 mg 2x before lunch and before dinner >>  >> 5 mg 2x a day before meals - Lantus 10 units daily - started at the end of the 03/2022 when sugars increased to 400/HI on higher dose steroids >> 8 units daily Previously on Ozempic 0.5 mg weekly - started 12/2018 >> indigestion and decreased appetite >> stopped 04/2021 Previously on Levemir 14 >> 10 >> 4-6 units at bedtime >> stopped 07/2019 She tried Glimepiride in the past >> fluctuating blood sugar. We stopped glipizide and Januvia only started Ozempic 12/2018. She had a CGM in the past but could not afford it anymore.  She checks sugars 2-3x a day per review of her log: - am:  105-144 >> 232, then 125-189 >> 70-106, 119 >> 52, 63-103 - 2h after b'fast:  n/c >> 143, 160 >> 140s >> 247>> n/c - before lunch: n/c >> 110-120 >> n/c>> 95-140 >> 90-131, 140 - 2h after lunch: 140-154 >> n/c >> 153 >> 140s >> 249 >> n/c - before dinner: 124, 151 >> 303 >> 72-135, 153 >> 88, 90-150, 242 - 2h after dinner: 140s, 188 (icecream) >> n/c >> 120-168 >> n/c - bedtime: 110-120 >> n/c    >> 83-130, 150, 260 >> 76-148, 183, 191 - nighttime: n/c >> 100-150 >> n/c >> 151 >> n/c Lowest sugar was 52 (x2 -Glipizide) ...>> 63 >> 52;  she has hypoglycemia awareness in the 70s. Highest sugar was 303 >> 260 >> 242.  Meter: ReliOn.  -No CKD, last BUN/creatinine:  Lab Results  Component Value Date   BUN 16 06/14/2022   CREATININE 0.62 06/14/2022  On lisinopril 10.  -+ HL; last set of lipids: Lab Results  Component Value Date   CHOL 146 06/14/2022   HDL 51.80 06/14/2022   LDLCALC 66 06/14/2022   LDLDIRECT 47.0 03/31/2021   TRIG 144.0 06/14/2022   CHOLHDL 3 06/14/2022  She was taken off statins in the past due to transaminitis in 10/2013.  Atorvastatin caused muscle cramps.  Currently on Crestor 10 and fish oil.  - last eye exam 04/2021: No DR reportedly.  She had cataract sx's 05/2018. Dr. Charise Killian.  - Denies numbness and tingling in her feet.  Last foot exam 03/04/2022.  Started on Omeprazole for choking >> resolved.  She had an esophageal dilation 03/2020. She has steroid injections in knees - had 1 TKR. She has bilateral carpal tunnel - had steroid inj. She was dx'ed with PSVT after a syncopal episode at church at the beginning of 2022. On Metoprolol now.  Thyroid nodule: CT neck and soft tissue (03/13/2021) showing a 1 cm right thyroid nodule with calcification.  Thyroid U/S (05/11/2021): Parenchymal Echotexture: Mildly heterogenous  Isthmus: 0.4 cm  Right lobe: 4.6 x 1.9 x 1.7 cm  Left lobe: 3.0 x 1.0 x 1.3 cm  _____________________________________________________________________   Nodule # 1:  Location: Isthmus  Maximum size: 0.5 cm; Other 2 dimensions: 0.4 x 0.3 cm  Composition: solid/almost completely solid (2)  Echogenicity: hypoechoic (2)    Given size (<0.9 cm) and appearance, this nodule does NOT meet TI-RADS criteria for biopsy or dedicated follow-up.  ________________________________________________________   Nodule # 2:  Location: Right; Mid   Maximum size: 1.5 cm; Other 2 dimensions: 1.3 x 1.4 cm  Composition: solid/almost completely solid (2)  Echogenicity: hyperechoic (1)  Echogenic foci: macrocalcifications (1)    **Given size (>/= 1.5 cm) and appearance, fine needle aspiration of this moderately suspicious nodule should be considered based on TI-RADS criteria.   Nodule # 3:  Location: Left; Mid  Maximum size: 0.5 cm; Other 2 dimensions: 0.4 x 0.4 cm  Composition: solid/almost completely solid (2)  Echogenicity: hypoechoic (2)   Given size (<0.9 cm) and appearance, this nodule does NOT meet TI-RADS criteria for biopsy or dedicated follow-up.  _________________________________________________________   Small nonenlarged bilateral cervical lymph nodes demonstrate normal lymph node architecture.   IMPRESSION: 1. 1.5 cm right mid thyroid nodule (nodule 2) just meets criteria for fine-needle aspiration. 2. 0.5 cm isthmus and 0.5 cm left thyroid nodules do not meet criteria for biopsy or dedicated follow-up.   FNA (04/01/2022): AUS Afirma molecular marker: Benign  Pt denies: - feeling nodules in neck - hoarseness - dysphagia after Es dilation - choking  + FH of thyroid nodule in mother >> benign. No FH of ThyCA.  TSH levels normal: Lab Results  Component Value Date   TSH 1.24 02/17/2022   TSH 1.37 11/09/2019   TSH 3.72 08/18/2018   TSH 1.96 08/17/2017   TSH 1.03 07/23/2016   TSH 1.64 06/24/2014   TSH 1.39 02/15/2013   TSH 1.29 07/27/2012   TSH 1.79 12/22/2011   TSH 1.16 03/06/2008    ROS: + See HPI + Tremors, + joint pain  I reviewed pt's medications, allergies, PMH, social hx, family hx, and changes were documented in the history of present illness. Otherwise, unchanged from my initial visit note.  Past Medical History:  Diagnosis Date   Acute medial meniscus tear of right knee    Adrenal insufficiency (Addison's disease) (HCC) 02/2021   Anemia of chronic disease    Mild, Hb stable  consistently   Anxiety and depression    Carpal tunnel syndrome    2023, bilateral   Chest pain 10/2017   +Cardiac CT.  Myocardial perfusion imaging NORMAL, EF>65%   Collagen vascular disease (HCC)    Diabetes mellitus 1995   Managed by Dr. Elvera Lennox (Endo)   GERD (gastroesophageal reflux disease) per pt watches diet and take tums as needed   with hx of prox esoph stricture + dilation procedure   H/O hiatal hernia    slightly larger ("moderate" size) on CT angio done to r/o PE 10/2017. noted on egd again 03/2021   History of adrenal insufficiency    in 2022 when she tried coming off her chronic daily prednisone for her RA   Hyperlipidemia    Hypertension    Interstitial cystitis    Lichen sclerosus of female genitalia    Migraine    Osteoarthritis, multiple sites  Palpitations 10/2017   3 wk event monitor: symptoms correlate with sinus rhythm with PACs, at times runs of PACs up to 5 beats.   Pseudogout of knee, left    colchicine helpful   Psoriasis    Seasonal allergies    Seropositive rheumatoid arthritis (HCC)    Hands, knees, jaw, elbow: responding to Simponi as of 09/29/15 rheum f/u.  Waning effectiveness on 03/2016 f/u so pt switched to Orencia injections.  Doing well on chronic low-dose prednisone therapy+ methotrexate +orencia as of 01/2019, 08/2019, 11/2019    Past Surgical History:  Procedure Laterality Date   ABDOMINAL HYSTERECTOMY  1991   for fibroids; ovaries still in   BIOPSY  03/17/2021   Procedure: BIOPSY;  Surgeon: Kathi Der, MD;  Location: [facility];  Service: Gastroenterology;;   BREAST BIOPSY  2001; 02/12/16   fibrocystic breast disease in 2001 and again in 02/2016 (+scar from prev bx site w/calcifications).   CARDIOVASCULAR STRESS TEST  11/01/2017   Myocard perf imaging: NORMAL (EF >60-65%).  12/30/21 normal   CARPAL TUNNEL RELEASE Right 2024   COLONOSCOPY  09/12/2013; 02/25/20   Normal 2015 and 2021: Recall 5 yrs ([facility], Dr. Evette Cristal) due to FH of colon  polyps.   CYSTOSCOPY  2004   for chronic UTI   DEXA  10/2016   Bone density normal (T-score 0.0)   ESOPHAGOGASTRODUODENOSCOPY  04/19/2011   Nl except antral gastritis: bx = mild chron gastritis (H pyloria neg)    ESOPHAGOGASTRODUODENOSCOPY (EGD) WITH PROPOFOL N/A 03/27/2020   Esoph stenosis-->dilated.  Mod size hiatal hernia ([facility]). Procedure: ESOPHAGOGASTRODUODENOSCOPY (EGD) WITH PROPOFOL ;  Surgeon: Graylin Shiver, MD;  Location: [facility];  Service: Endoscopy;  Laterality: N/A;   ESOPHAGOGASTRODUODENOSCOPY (EGD) WITH PROPOFOL N/A 03/17/2021   Esoph stricture->dilated.  Large hiatal hernia. Also +esoph candidiasis, bx inflammation.  Procedure: ESOPHAGOGASTRODUODENOSCOPY (EGD) WITH PROPOFOL;  Surgeon: Kathi Der, MD;  Location: [facility];  Service: Gastroenterology;  Laterality: N/A;   EVENT MONITOR  10/2017   3 wk-->Symptoms correlate with sinus rhythm with PACs, at times runs of PACs up to 5 beats.   GALLBLADDER SURGERY  2019   intraarticular steroid injection  2013   3 R knee & L X 1; Dr Netta Corrigan   KNEE ARTHROCENTESIS  2013   R knee x 3 & L X 1   KNEE ARTHROSCOPY  10/21/2011   Procedure: ARTHROSCOPY KNEE;  Surgeon: Jacki Cones, MD;  Location: [facility];  Service: Orthopedics;  Laterality: Right;  WITH MEDIAL and lateral shaving of femoral chondyl  with microfracture technique of lateral and medial femoral chondyl suprapatellar synovectomy   LAPAROSCOPIC CHOLECYSTECTOMY  01/2018   myocardial perf imaging     Normal 12/2021   SAVORY DILATION N/A 03/27/2020   Procedure: SAVORY DILATION;  Surgeon: Graylin Shiver, MD;  Location: [facility];  Service: Endoscopy;  Laterality: N/A;   TOTAL KNEE ARTHROPLASTY  04/12/2012   RIGHT  Procedure: TOTAL KNEE ARTHROPLASTY;  Surgeon: Jacki Cones, MD;  Location: [facility];  Service: Orthopedics;  Laterality: Right;  Right Total Knee Arthroplasty   TOTAL KNEE ARTHROPLASTY Left 05/26/2021   Procedure: TOTAL  KNEE ARTHROPLASTY;  Surgeon: Durene Romans, MD;  Location: [facility];  Service: Orthopedics;  Laterality: Left;   TRANSTHORACIC ECHOCARDIOGRAM  11/13/2021   EF 65-70%, mild dec RV fxn, grd I DD, o/w normal   TUBAL LIGATION  1981    Social History   Socioeconomic History   Marital status: Married  Spouse name: Not on file   Number of children: Not on file   Years of education: Not on file   Highest education level: Not on file  Occupational History   Not on file  Tobacco Use   Smoking status: Never   Smokeless tobacco: Never  Vaping Use   Vaping Use: Never used  Substance and Sexual Activity   Alcohol use: No   Drug use: No   Sexual activity: Not on file  Other Topics Concern   Not on file  Social History Narrative   Married, 3 children (2 in [place], 1 in [place]).  4 grandchildren.   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)   No tob/alc/drugs.   2 cups of coffee/day x 3 months   Regular exercise- no-due to arthritis.   Religion affecting care, "it allows stress management:"   Social Determinants of Health   Financial Resource Strain: Low Risk  (08/26/2022)   Overall Financial Resource Strain (CARDIA)    Difficulty of Paying Living Expenses: Not hard at all  Food Insecurity: No Food Insecurity (08/26/2022)   Hunger Vital Sign    Worried About Running Out of Food in the Last Year: Never true    Ran Out of Food in the Last Year: Never true  Transportation Needs: No Transportation Needs (08/26/2022)   PRAPARE - Administrator, Civil Service (Medical): No    Lack of Transportation (Non-Medical): No  Physical Activity: Insufficiently Active (08/26/2022)   Exercise Vital Sign    Days of Exercise per Week: 1 day    Minutes of Exercise per Session: 10 min  Stress: No Stress Concern Present (08/26/2022)   [employer] of Occupational Health - Occupational Stress Questionnaire    Feeling of Stress : Only a little  Social Connections: Socially Integrated  (08/26/2022)   Social Connection and Isolation Panel [NHANES]    Frequency of Communication with Friends and Family: Twice a week    Frequency of Social Gatherings with Friends and Family: Twice a week    Attends Religious Services: More than 4 times per year    Active Member of [employer] or Organizations: Yes    Attends Engineer, structural: More than 4 times per year    Marital Status: Married  Catering manager Violence: Not At Risk (08/26/2022)   Humiliation, Afraid, Rape, and Kick questionnaire    Fear of Current or Ex-Partner: No    Emotionally Abused: No    Physically Abused: No    Sexually Abused: No    Current Outpatient Medications on File Prior to Visit  Medication Sig Dispense Refill   acetaminophen (TYLENOL) 500 MG tablet Take 1,000 mg by mouth every 6 (six) hours as needed for moderate pain. (Patient not taking: Reported on 10/14/2022)     albuterol (VENTOLIN HFA) 108 (90 Base) MCG/ACT inhaler Inhale 2 puffs into the lungs every 6 (six) hours as needed for wheezing or shortness of breath. (Patient not taking: Reported on 06/14/2022) 8 g 0   amphetamine-dextroamphetamine (ADDERALL XR) 15 MG 24 hr capsule Take 1 capsule by mouth every morning. TAKE ONE CAPSULE BY MOUTH EVERY MORNING 30 capsule 0   aspirin EC 81 MG tablet Take 81 mg by mouth daily. Swallow whole.     cetirizine (ZYRTEC) 10 MG tablet Take 10 mg by mouth daily.     CYANOCOBALAMIN PO Take 1 tablet by mouth daily.     diazepam (VALIUM) 5 MG tablet TAKE ONE TABLET BY MOUTH EVERY TWELVE HOURS  AS NEEDED FOR MIGRAINE HEADACHES 60 tablet 5   Ferrous Sulfate (IRON PO) Take 1 tablet by mouth daily.     FOLIC ACID PO Take 2 mg by mouth at bedtime.     glipiZIDE (GLUCOTROL) 5 MG tablet Take 0.5-1 tablets (2.5-5 mg total) by mouth 2 (two) times daily before a meal. 180 tablet 3   insulin glargine (LANTUS SOLOSTAR) 100 UNIT/ML Solostar Pen Inject 8-10 Units into the skin at bedtime. 15 mL 3   Insulin Pen Needle 32G X 4 MM  MISC Use 1x a day 100 each 3   leflunomide (ARAVA) 20 MG tablet Take 20 mg by mouth daily.     lisinopril (ZESTRIL) 20 MG tablet TAKE ONE-HALF TABLET BY MOUTH EVERY DAY 45 tablet 3   metFORMIN (GLUCOPHAGE-XR) 500 MG 24 hr tablet Take 2 tablets (1,000 mg total) by mouth 2 (two) times daily with a meal. 360 tablet 3   methotrexate 50 MG/2ML injection Inject 22.5 mg into the muscle every Monday.     metoprolol tartrate (LOPRESSOR) 25 MG tablet TAKE 1/2 TABLET (12.5mg  total) BY MOUTH TWICE DAILY 90 tablet 3   omeprazole (PRILOSEC) 40 MG capsule Take 1 capsule (40 mg total) by mouth 2 (two) times daily. 180 capsule 3   phenazopyridine (PYRIDIUM) 95 MG tablet Take 190 mg by mouth 2 (two) times daily as needed for pain. (Patient not taking: Reported on 10/14/2022)     polyethylene glycol (MIRALAX / GLYCOLAX) 17 g packet Take 17 g by mouth daily as needed for mild constipation. (Patient not taking: Reported on 06/14/2022) 14 each 0   Polyvinyl Alcohol-Povidone (REFRESH OP) Place 1 drop into both eyes 2 (two) times daily.     predniSONE (DELTASONE) 5 MG tablet Take 1-2 tablets (5-10 mg total) by mouth daily. 180 tablet 3   primidone (MYSOLINE) 50 MG tablet Take 50-100 mg by mouth See admin instructions. Take 50 mg by mouth in the morning and 100 mg by mouth in the evening.     promethazine (PHENERGAN) 12.5 MG tablet TAKE 1-2 TABLETS BY MOUTH EVERY SIX hours AS NEEDED FOR nausea 30 tablet 2   rosuvastatin (CRESTOR) 20 MG tablet TAKE ONE TABLET BY MOUTH EVERY DAY 90 tablet 3   sertraline (ZOLOFT) 100 MG tablet Take 1 tablet (100 mg total) by mouth daily. 90 tablet 1   No current facility-administered medications on file prior to visit.    Allergies  Allergen Reactions   Steri-Strip Compound Benzoin [Benzoin Compound] Anaphylaxis and Itching    Blisters and itching   Penicillins Rash    Tolerated Cephalosporin 05/26/21.     Pravastatin     Myalgias   Simvastatin     Myalgias    Hydrocodone-Acetaminophen Nausea And Vomiting   Hydroxychloroquine Sulfate Rash    Family History  Problem Relation Age of Onset   Diabetes Mother    Stroke Mother 55   Osteoarthritis Mother    Stroke Brother 26   Heart disease Father        CABG   Lung cancer Father    Prostate cancer Father    Pancreatic cancer Father    Pancreatic cancer Paternal Grandfather    Bipolar disorder Daughter    Anxiety disorder Son    Arthritis Maternal Grandmother        rheumatoid   Heart attack Brother 45       smoker   PE: BP 136/80 (BP Location: Right Arm, Patient Position: Sitting, Cuff Size: Normal)  Pulse 91   Ht 5' 8.5" (1.74 m)   Wt 171 lb 12.8 oz (77.9 kg)   SpO2 99%   BMI 25.74 kg/m    Wt Readings from Last 3 Encounters:  11/09/22 171 lb 12.8 oz (77.9 kg)  10/14/22 166 lb 6.4 oz (75.5 kg)  08/26/22 158 lb 9.6 oz (71.9 kg)   Constitutional: overweight, in NAD Eyes: no exophthalmos ENT: no masses palpated in neck, no cervical lymphadenopathy Cardiovascular: RRR, No MRG Respiratory: CTA B Musculoskeletal: no deformities Skin: no rashes Neurological: + tremor with outstretched hands  ASSESSMENT: 1. DM2, insulin-independent, uncontrolled, without long term complications, but with Hgly -She was on the freestyle libre CGM in the past but cannot afford it anymore.  2. HL  3.  Central adrenal insufficiency  4.  Right thyroid nodule  PLAN:  1. Patient with longstanding, uncontrolled, type 2 diabetes, with improved control after adding a GLP-1 receptor agonist to the metformin ER/sulfonylurea regimen.  We could not use a higher dose of Ozempic due to diarrhea.  However, afterwards, she came off all of her diabetic medications and sugars increase significantly while on prednisone.  We had to add back long-acting insulin and also metformin and sulfonylurea.  At last visit, sugars were much better so we backed off glipizide and Lantus doses. - at today's visit, most of her CBGs  are at goal for a few is in the 50s and 60s and 1 in the 200s.  She forgot to reduce the dose of glipizide to half a tablet before meals, as suggested at last visit.  I advised her to do this now.  Otherwise we can continue the rest of the regimen. - I suggested to:  Patient Instructions  Please continue: - Metformin ER 1000 mg 2x daily - Lantus 8 units daily  Reduce - Glipizide 2.5 mg 2x a day before meals  Reduce Prednisone: - 9 mg daily in am and continue to try to decrease the dose by 1 mg daily every 2-3 weeks until you get to 5 mg   Regarding Prednisone: - You absolutely need to take this medication every day and not skip doses. - Please double the dose if you have a fever, for the duration of the fever. - If you cannot take anything by mouth (vomiting) or you have severe diarrhea so that you eliminate the prednisone pills in your stool, please make sure that you get steroids in the vein instead - go to the nearest emergency department/urgent care or you may go to your PCPs office   Put on your Medalert bracelet.  Please come back for a follow-up appointment in 3-4 months.  - we checked her HbA1c: 6.1% (at goal, slightly higher than before) - advised to check sugars at different times of the day - 1x a day, rotating check times - advised for yearly eye exams >> she is not UTD - Will see her back in 3 to 4 months   2. HL -Reviewed latest lipid panel from 06/2022: Fractions at goal: Lab Results  Component Value Date   CHOL 146 06/14/2022   HDL 51.80 06/14/2022   LDLCALC 66 06/14/2022   LDLDIRECT 47.0 03/31/2021   TRIG 144.0 06/14/2022   CHOLHDL 3 06/14/2022  -She continues Crestor 10 mg daily and fish oil, without side effects  3. Central adrenal insufficiency  -Iatrogenic, due to steroid treatment for RA -Patient has had RA for many years, previously on 5 mg of prednisone daily and also intermittent prednisone  tapers, but she stopped prednisone completely before last  visit in an effort to manage her diabetes.  She started to feel very poorly, with weight loss, decreased appetite, weakness, and overall, feeling terrible.  We restarted prednisone right away. -At last visit, she again returned after period of time when she missed her prednisone.  We again discussed about the fact that this is extremely dangerous.  She absolutely cannot miss prednisone doses.   -At today's visit she tells me that she increase the dose of prednisone to 10 mg daily soon after our last visit and she continues on this.  She tried to reduce the dose to 7.5 mg daily but she was very tired on it and sleeping more so she went back to the previous dose.  At today's visit we discussed about possible side effects of steroids on her body and I recommended to retry reducing the dose, but may need to do a slower taper, first at 9 mg daily, and then increase by 1 mg daily every 2 to 3 weeks, to give her body enough time to tolerate the lower doses.  She agrees with this plan. -At last visit and again today, I emphasized the sick day rules to manage adrenal insufficiency especially if she is preparing to go on a cruise. - she has a medical alert bracelet mentioning adrenal insufficiency -but does not have it with her.  I strongly advised her to attach it and not taking  4.  Right thyroid nodule -Incidentally found on CT of the neck and soft tissues (03/13/2021): 1 cm right thyroid nodule with calcification. We checked a thyroid ultrasound (05/11/2021): Right mid 1.5 x 1.3 x 1.4 cm nodule appears to be solid, hypoechoic with macrocalcifications and the recommendation was made for biopsy.  Other nodules were subcm and not worrisome. A thyroid nodule biopsy 04/01/2022  was initially inconclusive, AUS, but the Afirma molecular marker returned benign -no neck compression symptoms -TSH was normal Lab Results  Component Value Date   TSH 1.24 02/17/2022  -Continue to follow her expectantly for now   Carlus Pavlov, MD PhD [facility]

## 2022-11-09 NOTE — Patient Instructions (Addendum)
Please continue: - Metformin ER 1000 mg 2x daily - Lantus 8 units daily  Reduce - Glipizide 2.5 mg 2x a day before meals  Reduce Prednisone: - 9 mg daily in am and continue to try to decrease the dose by 1 mg daily every 2-3 weeks until you get to 5 mg   Regarding Prednisone: - You absolutely need to take this medication every day and not skip doses. - Please double the dose if you have a fever, for the duration of the fever. - If you cannot take anything by mouth (vomiting) or you have severe diarrhea so that you eliminate the prednisone pills in your stool, please make sure that you get steroids in the vein instead - go to the nearest emergency department/urgent care or you may go to your PCPs office   Put on your Medalert bracelet.  Please come back for a follow-up appointment in 3-4 months.

## 2022-11-10 DIAGNOSIS — M0589 Other rheumatoid arthritis with rheumatoid factor of multiple sites: Secondary | ICD-10-CM | POA: Diagnosis not present

## 2022-12-09 DIAGNOSIS — Z79899 Other long term (current) drug therapy: Secondary | ICD-10-CM | POA: Diagnosis not present

## 2022-12-09 DIAGNOSIS — M0589 Other rheumatoid arthritis with rheumatoid factor of multiple sites: Secondary | ICD-10-CM | POA: Diagnosis not present

## 2022-12-23 ENCOUNTER — Telehealth: Payer: Self-pay

## 2022-12-23 NOTE — Telephone Encounter (Signed)
LVM for pt to return call. Pt had orders placed on 4/4 for mammogram and bone density. Based on our records, she does not have an appointment scheduled for either yet.   Note: if pt returns call, she can contact [facility] at [phone] or [phone].

## 2023-01-06 DIAGNOSIS — Z111 Encounter for screening for respiratory tuberculosis: Secondary | ICD-10-CM | POA: Diagnosis not present

## 2023-01-06 DIAGNOSIS — R5383 Other fatigue: Secondary | ICD-10-CM | POA: Diagnosis not present

## 2023-01-06 DIAGNOSIS — M0589 Other rheumatoid arthritis with rheumatoid factor of multiple sites: Secondary | ICD-10-CM | POA: Diagnosis not present

## 2023-01-06 DIAGNOSIS — Z79899 Other long term (current) drug therapy: Secondary | ICD-10-CM | POA: Diagnosis not present

## 2023-01-06 DIAGNOSIS — R945 Abnormal results of liver function studies: Secondary | ICD-10-CM | POA: Diagnosis not present

## 2023-01-10 ENCOUNTER — Other Ambulatory Visit: Payer: Self-pay | Admitting: Family Medicine

## 2023-01-14 ENCOUNTER — Ambulatory Visit: Payer: Medicare (Managed Care) | Admitting: Family Medicine

## 2023-01-17 NOTE — Patient Instructions (Signed)

## 2023-01-20 ENCOUNTER — Encounter: Payer: Self-pay | Admitting: Family Medicine

## 2023-01-20 ENCOUNTER — Ambulatory Visit (INDEPENDENT_AMBULATORY_CARE_PROVIDER_SITE_OTHER): Payer: Medicare (Managed Care) | Admitting: Family Medicine

## 2023-01-20 VITALS — BP 113/78 | HR 76 | Wt 170.0 lb

## 2023-01-20 DIAGNOSIS — I10 Essential (primary) hypertension: Secondary | ICD-10-CM

## 2023-01-20 DIAGNOSIS — E7800 Pure hypercholesterolemia, unspecified: Secondary | ICD-10-CM

## 2023-01-20 DIAGNOSIS — F988 Other specified behavioral and emotional disorders with onset usually occurring in childhood and adolescence: Secondary | ICD-10-CM

## 2023-01-20 DIAGNOSIS — R7401 Elevation of levels of liver transaminase levels: Secondary | ICD-10-CM | POA: Diagnosis not present

## 2023-01-20 MED ORDER — AMPHETAMINE-DEXTROAMPHET ER 15 MG PO CP24: 15.0000 mg | ORAL_CAPSULE | Freq: Every morning | ORAL | 0 refills | Status: DC

## 2023-01-20 MED ORDER — ROSUVASTATIN CALCIUM 20 MG PO TABS: 20.0000 mg | ORAL_TABLET | Freq: Every day | ORAL | 3 refills | Status: DC

## 2023-01-20 MED ORDER — SERTRALINE HCL 100 MG PO TABS: 100.0000 mg | ORAL_TABLET | Freq: Every day | ORAL | 3 refills | Status: DC

## 2023-01-20 NOTE — Progress Notes (Signed)
OFFICE VISIT  01/20/2023  CC:  Chief Complaint  Patient presents with   Medical Management of Chronic Issues    Patient is a 67 y.o. female who presents for 29-month follow-up hypertension, hyperlipidemia, and adult ADD. A/P as of last visit: " hypertension, well-controlled.  Continue lisinopril 20 mg tab, one half tab daily and Lopressor one half of the 25 mg tab twice daily. Electrolytes and kidney function normal on rheumatology labs 1 month ago.  Will obtain these lab records.   3.  Hypercholesterolemia, excellent lipid panel 4 months ago. Plan repeat at next follow-up in 48mo. Continue rosuvastatin 20 mg/day.   4. adult ADD. Long-term stability on Adderall XR 15 mg daily."  INTERIM HX: Chantavia says she is doing pretty well.  She recently retired and is enjoying herself.  LFTs have been up lately, lab panels have been done by rheumatologist.  As result of the elevations her methotrexate has been discontinued.  She is on half of her leflunomide dosing.  I do not have these results available right now.  She is followed by Dr. Elvera Lennox for diabetes.  Pt states all is going well with the med at current dosing (Adderall XR 15 mg every morning): much improved focus, concentration, task completion.  Less frustration, better multitasking, less impulsivity and restlessness.  Mood is stable. No side effects from the medication.   PMP AWARE reviewed today: most recent rx for Adderall ER 15 mg was filled 01/11/2023, # 30, rx by me. No red flags.  Past Medical History:  Diagnosis Date   Acute medial meniscus tear of right knee    Adrenal insufficiency (Addison's disease) (HCC) 02/2021   Anemia of chronic disease    Mild, Hb stable consistently   Anxiety and depression    Carpal tunnel syndrome    2023, bilateral   Chest pain 10/2017   +Cardiac CT.  Myocardial perfusion imaging NORMAL, EF>65%   Collagen vascular disease (HCC)    Diabetes mellitus 1995   Managed by Dr. Elvera Lennox (Endo)    GERD (gastroesophageal reflux disease) per pt watches diet and take tums as needed   with hx of prox esoph stricture + dilation procedure   H/O hiatal hernia    slightly larger ("moderate" size) on CT angio done to r/o PE 10/2017. noted on egd again 03/2021   History of adrenal insufficiency    in 2022 when she tried coming off her chronic daily prednisone for her RA   Hyperlipidemia    Hypertension    Interstitial cystitis    Lichen sclerosus of female genitalia    Migraine    Osteoarthritis, multiple sites    Palpitations 10/2017   3 wk event monitor: symptoms correlate with sinus rhythm with PACs, at times runs of PACs up to 5 beats.   Pseudogout of knee, left    colchicine helpful   Psoriasis    Seasonal allergies    Seropositive rheumatoid arthritis (HCC)    Hands, knees, jaw, elbow: responding to Simponi as of 09/29/15 rheum f/u.  Waning effectiveness on 03/2016 f/u so pt switched to Orencia injections.  Doing well on chronic low-dose prednisone therapy+ methotrexate +orencia as of 01/2019, 08/2019, 11/2019    Past Surgical History:  Procedure Laterality Date   ABDOMINAL HYSTERECTOMY  1991   for fibroids; ovaries still in   BIOPSY  03/17/2021   Procedure: BIOPSY;  Surgeon: Kathi Der, MD;  Location: [facility];  Service: Gastroenterology;;   BREAST BIOPSY  2001; 02/12/16  fibrocystic breast disease in 2001 and again in 02/2016 (+scar from prev bx site w/calcifications).   CARDIOVASCULAR STRESS TEST  11/01/2017   Myocard perf imaging: NORMAL (EF >60-65%).  12/30/21 normal   CARPAL TUNNEL RELEASE Right 2024   COLONOSCOPY  09/12/2013; 02/25/20   Normal 2015 and 2021: Recall 5 yrs ([facility], Dr. Evette Cristal) due to FH of colon polyps.   CYSTOSCOPY  2004   for chronic UTI   DEXA  10/2016   Bone density normal (T-score 0.0)   ESOPHAGOGASTRODUODENOSCOPY  04/19/2011   Nl except antral gastritis: bx = mild chron gastritis (H pyloria neg)    ESOPHAGOGASTRODUODENOSCOPY (EGD) WITH PROPOFOL  N/A 03/27/2020   Esoph stenosis-->dilated.  Mod size hiatal hernia ([facility] Hea Spec). Procedure: ESOPHAGOGASTRODUODENOSCOPY (EGD) WITH PROPOFOL ;  Surgeon: Graylin Shiver, MD;  Location: [facility];  Service: Endoscopy;  Laterality: N/A;   ESOPHAGOGASTRODUODENOSCOPY (EGD) WITH PROPOFOL N/A 03/17/2021   Esoph stricture->dilated.  Large hiatal hernia. Also +esoph candidiasis, bx inflammation.  Procedure: ESOPHAGOGASTRODUODENOSCOPY (EGD) WITH PROPOFOL;  Surgeon: Kathi Der, MD;  Location: [facility];  Service: Gastroenterology;  Laterality: N/A;   EVENT MONITOR  10/2017   3 wk-->Symptoms correlate with sinus rhythm with PACs, at times runs of PACs up to 5 beats.   GALLBLADDER SURGERY  2019   intraarticular steroid injection  2013   3 R knee & L X 1; Dr Netta Corrigan   KNEE ARTHROCENTESIS  2013   R knee x 3 & L X 1   KNEE ARTHROSCOPY  10/21/2011   Procedure: ARTHROSCOPY KNEE;  Surgeon: Jacki Cones, MD;  Location: [facility];  Service: Orthopedics;  Laterality: Right;  WITH MEDIAL and lateral shaving of femoral chondyl  with microfracture technique of lateral and medial femoral chondyl suprapatellar synovectomy   LAPAROSCOPIC CHOLECYSTECTOMY  01/2018   myocardial perf imaging     Normal 12/2021   SAVORY DILATION N/A 03/27/2020   Procedure: SAVORY DILATION;  Surgeon: Graylin Shiver, MD;  Location: [facility];  Service: Endoscopy;  Laterality: N/A;   TOTAL KNEE ARTHROPLASTY  04/12/2012   RIGHT  Procedure: TOTAL KNEE ARTHROPLASTY;  Surgeon: Jacki Cones, MD;  Location: [facility];  Service: Orthopedics;  Laterality: Right;  Right Total Knee Arthroplasty   TOTAL KNEE ARTHROPLASTY Left 05/26/2021   Procedure: TOTAL KNEE ARTHROPLASTY;  Surgeon: Durene Romans, MD;  Location: [facility];  Service: Orthopedics;  Laterality: Left;   TRANSTHORACIC ECHOCARDIOGRAM  11/13/2021   EF 65-70%, mild dec RV fxn, grd I DD, o/w normal   TUBAL LIGATION  1981    Outpatient Medications Prior to  Visit  Medication Sig Dispense Refill   acetaminophen (TYLENOL) 500 MG tablet Take 1,000 mg by mouth every 6 (six) hours as needed for moderate pain.     albuterol (VENTOLIN HFA) 108 (90 Base) MCG/ACT inhaler Inhale 2 puffs into the lungs every 6 (six) hours as needed for wheezing or shortness of breath. 8 g 0   aspirin EC 81 MG tablet Take 81 mg by mouth daily. Swallow whole.     cetirizine (ZYRTEC) 10 MG tablet Take 10 mg by mouth daily.     CYANOCOBALAMIN PO Take 1 tablet by mouth daily.     diazepam (VALIUM) 5 MG tablet TAKE ONE TABLET BY MOUTH EVERY TWELVE HOURS AS NEEDED FOR MIGRAINE HEADACHES 60 tablet 5   Ferrous Sulfate (IRON PO) Take 1 tablet by mouth daily.     FOLIC ACID PO Take 2 mg by mouth at  bedtime.     glipiZIDE (GLUCOTROL) 5 MG tablet Take 0.5-1 tablets (2.5-5 mg total) by mouth 2 (two) times daily before a meal. 180 tablet 3   insulin glargine (LANTUS SOLOSTAR) 100 UNIT/ML Solostar Pen Inject 8-10 Units into the skin at bedtime. 15 mL 3   Insulin Pen Needle 32G X 4 MM MISC Use 1x a day 100 each 3   leflunomide (ARAVA) 20 MG tablet Take 20 mg by mouth daily.     lisinopril (ZESTRIL) 20 MG tablet TAKE ONE-HALF TABLET BY MOUTH EVERY DAY 45 tablet 3   metFORMIN (GLUCOPHAGE-XR) 500 MG 24 hr tablet Take 2 tablets (1,000 mg total) by mouth 2 (two) times daily with a meal. 360 tablet 3   methotrexate 50 MG/2ML injection Inject 22.5 mg into the muscle every Monday.     metoprolol tartrate (LOPRESSOR) 25 MG tablet TAKE 1/2 TABLET (12.5mg  total) BY MOUTH TWICE DAILY 90 tablet 3   omeprazole (PRILOSEC) 40 MG capsule Take 1 capsule (40 mg total) by mouth 2 (two) times daily. 180 capsule 3   Polyvinyl Alcohol-Povidone (REFRESH OP) Place 1 drop into both eyes 2 (two) times daily.     predniSONE (DELTASONE) 1 MG tablet Take 1 tablet (1 mg total) by mouth daily with breakfast. 120 tablet 11   predniSONE (DELTASONE) 5 MG tablet Take 1-2 tablets (5-10 mg total) by mouth daily. 180 tablet 3    primidone (MYSOLINE) 50 MG tablet Take 50-100 mg by mouth See admin instructions. Take 50 mg by mouth in the morning and 100 mg by mouth in the evening.     promethazine (PHENERGAN) 12.5 MG tablet TAKE 1-2 TABLETS BY MOUTH EVERY SIX hours AS NEEDED FOR nausea 30 tablet 2   amphetamine-dextroamphetamine (ADDERALL XR) 15 MG 24 hr capsule TAKE ONE CAPSULE BY MOUTH EVERY MORNING 30 capsule 0   rosuvastatin (CRESTOR) 20 MG tablet TAKE ONE TABLET BY MOUTH EVERY DAY 90 tablet 3   sertraline (ZOLOFT) 100 MG tablet Take 1 tablet (100 mg total) by mouth daily. 90 tablet 1   phenazopyridine (PYRIDIUM) 95 MG tablet Take 190 mg by mouth 2 (two) times daily as needed for pain. (Patient not taking: Reported on 10/14/2022)     No facility-administered medications prior to visit.    Allergies  Allergen Reactions   Steri-Strip Compound Benzoin [Benzoin Compound] Anaphylaxis and Itching    Blisters and itching   Penicillins Rash    Tolerated Cephalosporin 05/26/21.     Pravastatin     Myalgias   Simvastatin     Myalgias   Hydrocodone-Acetaminophen Nausea And Vomiting   Hydroxychloroquine Sulfate Rash    Review of Systems As per HPI  PE:    01/20/2023   11:25 AM 11/09/2022    2:17 PM 10/14/2022    9:11 AM  Vitals with BMI  Height  5' 8.5"   Weight 170 lbs 171 lbs 13 oz   BMI  25.74   Systolic 113 136 161  Diastolic 78 80 74  Pulse 76 91      Physical Exam  Gen: Alert, well appearing.  Patient is oriented to person, place, time, and situation. AFFECT: pleasant, lucid thought and speech. No further exam today  LABS:  Last CBC Lab Results  Component Value Date   WBC 7.4 02/17/2022   HGB 11.3 (L) 02/17/2022   HCT 34.7 (L) 02/17/2022   MCV 76.5 (L) 02/17/2022   MCH 27.5 05/27/2021   RDW 16.8 (H) 02/17/2022   PLT  327.0 02/17/2022   Lab Results  Component Value Date   IRON 30 (L) 11/02/2021   TIBC 375 11/02/2021   FERRITIN 53 11/02/2021   Last metabolic panel Lab Results   Component Value Date   GLUCOSE 66 (L) 06/14/2022   NA 137 06/14/2022   K 4.5 06/14/2022   CL 103 06/14/2022   CO2 24 06/14/2022   BUN 16 06/14/2022   CREATININE 0.62 06/14/2022   GFR 92.44 06/14/2022   CALCIUM 8.4 06/14/2022   PROT 6.5 02/17/2022   ALBUMIN 3.8 02/17/2022   BILITOT 0.4 02/17/2022   ALKPHOS 79 02/17/2022   AST 16 02/17/2022   ALT 17 02/17/2022   ANIONGAP 7 05/27/2021   Last lipids Lab Results  Component Value Date   CHOL 146 06/14/2022   HDL 51.80 06/14/2022   LDLCALC 66 06/14/2022   LDLDIRECT 47.0 03/31/2021   TRIG 144.0 06/14/2022   CHOLHDL 3 06/14/2022   Last hemoglobin A1c Lab Results  Component Value Date   HGBA1C 6.1 (A) 11/09/2022   Last thyroid functions Lab Results  Component Value Date   TSH 1.24 02/17/2022   Last vitamin B12 and Folate Lab Results  Component Value Date   VITAMINB12 267 08/05/2021   FOLATE 9.2 08/05/2021    IMPRESSION AND PLAN:  #1 hypertension, well-controlled. Continue lisinopril 20 mg tab, one half tab daily and Lopressor one half of the 25 mg tab twice daily. Will get most recent metabolic panel results from rheumatology.  2.  Hyperlipidemia, doing well on rosuvastatin 20 mg a day. Last lipids checked 06/2022 and were at goal.  Plan repeat approximately 06/2023.  #3 adult ADD. Doing well long-term on Adderall XR 15 mg a day. Controlled substance contract up-to-date.  Will update urine drug screen at next follow-up visit.  4.  Elevated transaminases.  The rheumatologist recently stopped her methotrexate and decreased her leflunomide dose.  She is repeating labs in a few weeks. Will get lab records from rheumatology. She had a period of elevation of AST and ALT back in 2022. We discussed possibly evaluating with right upper quadrant Limited abdominal ultrasound but chose to hold off at this time. We will bring up this discussion again at next follow-up.  An After Visit Summary was printed and given to the  patient.  FOLLOW UP: Return in about 3 months (around 04/22/2023) for routine chronic illness f/u. Next CPE 10/2023 Signed:  Santiago Bumpers, MD           01/20/2023

## 2023-02-02 NOTE — Progress Notes (Signed)
[facility] Quality Team Note  Name: Raven [name] Date of Birth: [birth year] MRN: [id number] Date: 02/02/2023  [facility] Quality Team has reviewed this patient's chart, please see recommendations below:  [facility] Quality Other; (EED- Called patient to offer [facility] event 02/15/2023, left voicemail.)

## 2023-02-03 DIAGNOSIS — M0589 Other rheumatoid arthritis with rheumatoid factor of multiple sites: Secondary | ICD-10-CM | POA: Diagnosis not present

## 2023-02-03 DIAGNOSIS — R7989 Other specified abnormal findings of blood chemistry: Secondary | ICD-10-CM | POA: Diagnosis not present

## 2023-02-04 LAB — BASIC METABOLIC PANEL
BUN: 10 (ref 4–21)
CO2: 17 (ref 13–22)
Chloride: 104 (ref 99–108)
Creatinine: 0.9 (ref 0.5–1.1)
Glucose: 394
Potassium: 4.1 meq/L (ref 3.5–5.1)
Sodium: 137 (ref 137–147)

## 2023-02-04 LAB — COMPREHENSIVE METABOLIC PANEL
Albumin: 4 (ref 3.5–5.0)
Calcium: 8.6 — AB (ref 8.7–10.7)
Globulin: 2.1
eGFR: 74

## 2023-02-04 LAB — HEPATIC FUNCTION PANEL
ALT: 90 U/L — AB (ref 7–35)
AST: 61 — AB (ref 13–35)
Alkaline Phosphatase: 70 (ref 25–125)

## 2023-02-08 ENCOUNTER — Other Ambulatory Visit: Payer: Self-pay | Admitting: Rheumatology

## 2023-02-08 DIAGNOSIS — R7989 Other specified abnormal findings of blood chemistry: Secondary | ICD-10-CM

## 2023-02-21 ENCOUNTER — Ambulatory Visit
Admission: RE | Admit: 2023-02-21 | Discharge: 2023-02-21 | Disposition: A | Discharge status: Home / Self Care | Payer: Medicare (Managed Care) | Source: Ambulatory Visit | Attending: Family Medicine | Admitting: Family Medicine

## 2023-02-21 DIAGNOSIS — Z1231 Encounter for screening mammogram for malignant neoplasm of breast: Secondary | ICD-10-CM | POA: Diagnosis not present

## 2023-02-21 DIAGNOSIS — R7989 Other specified abnormal findings of blood chemistry: Secondary | ICD-10-CM | POA: Diagnosis not present

## 2023-02-22 ENCOUNTER — Ambulatory Visit
Admission: RE | Admit: 2023-02-22 | Discharge: 2023-02-22 | Disposition: A | Discharge status: Home / Self Care | Payer: Medicare (Managed Care) | Source: Ambulatory Visit | Attending: Rheumatology | Admitting: Rheumatology

## 2023-02-22 DIAGNOSIS — R7989 Other specified abnormal findings of blood chemistry: Secondary | ICD-10-CM

## 2023-02-22 LAB — COMPREHENSIVE METABOLIC PANEL
Albumin: 4.2 (ref 3.5–5.0)
Calcium: 8.7 (ref 8.7–10.7)
Globulin: 1.8
eGFR: 78

## 2023-02-22 LAB — BASIC METABOLIC PANEL
BUN: 15 (ref 4–21)
CO2: 21 (ref 13–22)
Chloride: 105 (ref 99–108)
Creatinine: 0.8 (ref 0.5–1.1)
Glucose: 72
Potassium: 4.1 meq/L (ref 3.5–5.1)
Sodium: 142 (ref 137–147)

## 2023-02-22 LAB — HEPATIC FUNCTION PANEL
ALT: 76 U/L — AB (ref 7–35)
AST: 57 — AB (ref 13–35)
Alkaline Phosphatase: 57 (ref 25–125)

## 2023-02-28 DIAGNOSIS — M0589 Other rheumatoid arthritis with rheumatoid factor of multiple sites: Secondary | ICD-10-CM | POA: Diagnosis not present

## 2023-02-28 DIAGNOSIS — M1991 Primary osteoarthritis, unspecified site: Secondary | ICD-10-CM | POA: Diagnosis not present

## 2023-02-28 DIAGNOSIS — E663 Overweight: Secondary | ICD-10-CM | POA: Diagnosis not present

## 2023-02-28 DIAGNOSIS — M1120 Other chondrocalcinosis, unspecified site: Secondary | ICD-10-CM | POA: Diagnosis not present

## 2023-02-28 DIAGNOSIS — Z6825 Body mass index (BMI) 25.0-25.9, adult: Secondary | ICD-10-CM | POA: Diagnosis not present

## 2023-02-28 DIAGNOSIS — L408 Other psoriasis: Secondary | ICD-10-CM | POA: Diagnosis not present

## 2023-02-28 DIAGNOSIS — K760 Fatty (change of) liver, not elsewhere classified: Secondary | ICD-10-CM | POA: Diagnosis not present

## 2023-02-28 DIAGNOSIS — Z79899 Other long term (current) drug therapy: Secondary | ICD-10-CM | POA: Diagnosis not present

## 2023-03-03 DIAGNOSIS — M0589 Other rheumatoid arthritis with rheumatoid factor of multiple sites: Secondary | ICD-10-CM | POA: Diagnosis not present

## 2023-03-03 DIAGNOSIS — R7989 Other specified abnormal findings of blood chemistry: Secondary | ICD-10-CM | POA: Diagnosis not present

## 2023-03-10 ENCOUNTER — Encounter: Payer: Self-pay | Admitting: Pharmacist

## 2023-03-10 NOTE — Progress Notes (Signed)
Pharmacy Quality Measure Review  This patient is appearing on a report for being at risk of failing the adherence measure for hypertension (ACEi/ARB) medications this calendar year.   Medication: lisinopril 20mg  Last fill date: 09/08/2022 for 90 day supply per insurance report.  Reviewed Dr Tiajuana [facility] database and patient actually filled lisinopril for 90 DS on 01/10/2023.   In reviewing all her medications and refill history it looks like she might be past due to fill the following medications: Metformin ER 500mg  - take 2 tablets = 1000mg  twice a day - last refill was 11/01/2022 for #360 = 90 day supply  Patient is also due to have annual eye exam   Attempted to reach patient by got VM and mailbox was full.   Patient will see endocrinologist tomorrow and will send message to discuss metformin dose and eye exam.   Henrene Pastor, PharmD Clinical Pharmacist [facility]  [facility] [phone]

## 2023-03-11 ENCOUNTER — Ambulatory Visit (INDEPENDENT_AMBULATORY_CARE_PROVIDER_SITE_OTHER): Payer: Medicare (Managed Care) | Admitting: Internal Medicine

## 2023-03-11 ENCOUNTER — Encounter: Payer: Self-pay | Admitting: Internal Medicine

## 2023-03-11 VITALS — BP 120/64 | HR 78 | Ht 68.0 in | Wt 171.2 lb

## 2023-03-11 DIAGNOSIS — E785 Hyperlipidemia, unspecified: Secondary | ICD-10-CM

## 2023-03-11 DIAGNOSIS — Z794 Long term (current) use of insulin: Secondary | ICD-10-CM | POA: Diagnosis not present

## 2023-03-11 DIAGNOSIS — E1165 Type 2 diabetes mellitus with hyperglycemia: Secondary | ICD-10-CM

## 2023-03-11 DIAGNOSIS — E2749 Other adrenocortical insufficiency: Secondary | ICD-10-CM | POA: Diagnosis not present

## 2023-03-11 DIAGNOSIS — Z7984 Long term (current) use of oral hypoglycemic drugs: Secondary | ICD-10-CM

## 2023-03-11 LAB — MICROALBUMIN / CREATININE URINE RATIO
Creatinine,U: 142.6 mg/dL
Microalb Creat Ratio: 1.5 mg/g (ref 0.0–30.0)
Microalb, Ur: 2.2 mg/dL — ABNORMAL HIGH (ref 0.0–1.9)

## 2023-03-11 LAB — POCT GLYCOSYLATED HEMOGLOBIN (HGB A1C): Hemoglobin A1C: 6.9 % — AB (ref 4.0–5.6)

## 2023-03-11 MED ORDER — PREDNISONE 5 MG PO TABS
5.0000 mg | ORAL_TABLET | Freq: Every day | ORAL | 3 refills | Status: DC
Start: 2023-03-11 — End: 2024-05-18

## 2023-03-11 MED ORDER — LANTUS SOLOSTAR 100 UNIT/ML ~~LOC~~ SOPN
8.0000 [IU] | PEN_INJECTOR | Freq: Every day | SUBCUTANEOUS | 3 refills | Status: DC
Start: 2023-03-11 — End: 2023-07-11

## 2023-03-11 MED ORDER — GLIPIZIDE 5 MG PO TABS
2.5000 mg | ORAL_TABLET | Freq: Two times a day (BID) | ORAL | 3 refills | Status: DC
Start: 2023-03-11 — End: 2023-11-17

## 2023-03-11 NOTE — Progress Notes (Signed)
Patient ID: Raven [name], female   DOB: 1956/04/22, 67 y.o.   MRN: [id number]  HPI: Raven [name] is a 67 y.o.-year-old female, returning for f/u for DM2, dx ~1995, insulin-dependent since 11/2013, insulin-dependent, controlled, without long-term complications. Last visit 4 months ago.  Interim history: No increased urination, blurry vision, nausea, chest pain. She is on Actemra for RA.  At last visit she was on prednisone 10 mg, increased 06/2022. Tried to reduce the dose to 7.5 mg daily >> fatigue, diarrhea.  Since then, she was able to decrease the dose to 5 mg daily. She started Magnesium >> feels better on this, but started to have more low CBGs in last 6 weeks. She wakes up at night to snack as she has lows. She had to have a steroid injection this summer for dehydration in her rheumatologist office. For her RA, she is off methotrexate and on Actemra.  Reviewed HbA1c levels: Lab Results  Component Value Date   HGBA1C 6.1 (A) 11/09/2022   HGBA1C 6.0 (A) 07/08/2022   HGBA1C 9.2 (H) 02/17/2022   She has RA. On prednisone 5 >> 10 >> 5 mg daily.  Pt is on a regimen of: - Metformin ER 500 mg >> [phone] >> 1000 mg 2x a day - Glipizide 5 mg 2x before lunch and before dinner >>  >> 5 >> 2.5 >> 5 mg 2x a day before meals - Lantus 10 units daily - started at the end of the 03/2022 when sugars increased to 400/HI on higher dose steroids >> 8 units daily Previously on Ozempic 0.5 mg weekly - started 12/2018 >> indigestion and decreased appetite >> stopped 04/2021 Previously on Levemir 14 >> 10 >> 4-6 units at bedtime >> stopped 07/2019 She tried Glimepiride in the past >> fluctuating blood sugar. We stopped glipizide and Januvia only started Ozempic 12/2018. She had a CGM in the past but could not afford it anymore.  She checks sugars 2-3x a day  - forgot her log: - am: 232, then 125-189 >> 70-106, 119 >> 52, 63-103 >> 100-116 - 2h after b'fast:  n/c >> 143, 160 >> 140s >> 247>>  n/c - before lunch: 110-120 >> n/c>> 95-140 >> 90-131, 140 >> 90s - 2h after lunch: 140-154 >> n/c >> 153 >> 140s >> 249 >> n/c - before dinner:  303 >> 72-135, 153 >> 88, 90-150, 242 >> 120 - 2h after dinner: 140s, 188 (icecream) >> n/c >> 120-168 >> n/c - bedtime: n/c   >> 83-130, 150, 260 >> 76-148, 183, 191 >> 140 - nighttime: n/c >> 100-150 >> n/c >> 151 >> n/c >> 53-90 Lowest sugar was 52 (x2 -Glipizide) ...>> 63 >> 52 >> 53;  she has hypoglycemia awareness in the 70s. Highest sugar was 303 >> 260 >> 242 >> 239.  Meter: ReliOn.  -No CKD, last BUN/creatinine:  Lab Results  Component Value Date   BUN 16 06/14/2022   CREATININE 0.62 06/14/2022   Lab Results  Component Value Date   MICRALBCREAT 0.9 11/23/2013   MICRALBCREAT 0.2 06/29/2013   MICRALBCREAT 0.3 02/15/2013   MICRALBCREAT 0.5 10/26/2012   MICRALBCREAT 0.9 07/27/2012   MICRALBCREAT 0.4 03/14/2012   MICRALBCREAT 1.0 12/22/2011   MICRALBCREAT 0.7 12/21/2010   MICRALBCREAT 0.6 09/21/2010   MICRALBCREAT 0.7 12/05/2009  On lisinopril 10.  -+ HL; last set of lipids: Lab Results  Component Value Date   CHOL 146 06/14/2022   HDL 51.80 06/14/2022   LDLCALC 66 06/14/2022  LDLDIRECT 47.0 03/31/2021   TRIG 144.0 06/14/2022   CHOLHDL 3 06/14/2022  She was taken off statins in the past due to transaminitis in 10/2013.  Atorvastatin caused muscle cramps.  Currently on Crestor 10 and fish oil.  - last eye exam 04/2021: No DR reportedly.  She had cataract sx's 05/2018. Dr. Charise Killian.  - Denies numbness and tingling in her feet.  Last foot exam 03/04/2022.  Started on Omeprazole for choking >> resolved.  She had an esophageal dilation 03/2020. She has steroid injections in knees - had 1 TKR. She has bilateral carpal tunnel - had steroid inj. She was dx'ed with PSVT after a syncopal episode at church at the beginning of 2022. On Metoprolol now.  Thyroid nodule: CT neck and soft tissue (03/13/2021) showing a 1 cm right thyroid  nodule with calcification.  Thyroid U/S (05/11/2021): Parenchymal Echotexture: Mildly heterogenous  Isthmus: 0.4 cm  Right lobe: 4.6 x 1.9 x 1.7 cm  Left lobe: 3.0 x 1.0 x 1.3 cm  _____________________________________________________________________   Nodule # 1:  Location: Isthmus  Maximum size: 0.5 cm; Other 2 dimensions: 0.4 x 0.3 cm  Composition: solid/almost completely solid (2)  Echogenicity: hypoechoic (2)    Given size (<0.9 cm) and appearance, this nodule does NOT meet TI-RADS criteria for biopsy or dedicated follow-up.  ________________________________________________________   Nodule # 2:  Location: Right; Mid  Maximum size: 1.5 cm; Other 2 dimensions: 1.3 x 1.4 cm  Composition: solid/almost completely solid (2)  Echogenicity: hyperechoic (1)  Echogenic foci: macrocalcifications (1)    **Given size (>/= 1.5 cm) and appearance, fine needle aspiration of this moderately suspicious nodule should be considered based on TI-RADS criteria.   Nodule # 3:  Location: Left; Mid  Maximum size: 0.5 cm; Other 2 dimensions: 0.4 x 0.4 cm  Composition: solid/almost completely solid (2)  Echogenicity: hypoechoic (2)   Given size (<0.9 cm) and appearance, this nodule does NOT meet TI-RADS criteria for biopsy or dedicated follow-up.  _________________________________________________________   Small nonenlarged bilateral cervical lymph nodes demonstrate normal lymph node architecture.   IMPRESSION: 1. 1.5 cm right mid thyroid nodule (nodule 2) just meets criteria for fine-needle aspiration. 2. 0.5 cm isthmus and 0.5 cm left thyroid nodules do not meet criteria for biopsy or dedicated follow-up.   FNA (04/01/2022): AUS Afirma molecular marker: Benign  Pt denies: - feeling nodules in neck - hoarseness - dysphagia after Es dilation - choking  + FH of thyroid nodule in mother >> benign. No FH of ThyCA.  TSH levels normal: Lab Results  Component Value Date   TSH 1.24  02/17/2022   TSH 1.37 11/09/2019   TSH 3.72 08/18/2018   TSH 1.96 08/17/2017   TSH 1.03 07/23/2016   TSH 1.64 06/24/2014   TSH 1.39 02/15/2013   TSH 1.29 07/27/2012   TSH 1.79 12/22/2011   TSH 1.16 03/06/2008    ROS: + See HPI + Tremors, + joint pain  I reviewed pt's medications, allergies, PMH, social hx, family hx, and changes were documented in the history of present illness. Otherwise, unchanged from my initial visit note.  Past Medical History:  Diagnosis Date   Acute medial meniscus tear of right knee    Adrenal insufficiency (Addison's disease) (HCC) 02/2021   Anemia of chronic disease    Mild, Hb stable consistently   Anxiety and depression    Carpal tunnel syndrome    2023, bilateral   Chest pain 10/2017   +Cardiac CT.  Myocardial perfusion imaging  NORMAL, EF>65%   Collagen vascular disease (HCC)    Diabetes mellitus 1995   Managed by Dr. Elvera Lennox (Endo)   GERD (gastroesophageal reflux disease) per pt watches diet and take tums as needed   with hx of prox esoph stricture + dilation procedure   H/O hiatal hernia    slightly larger ("moderate" size) on CT angio done to r/o PE 10/2017. noted on egd again 03/2021   History of adrenal insufficiency    in 2022 when she tried coming off her chronic daily prednisone for her RA   Hyperlipidemia    Hypertension    Interstitial cystitis    Lichen sclerosus of female genitalia    Migraine    Osteoarthritis, multiple sites    Palpitations 10/2017   3 wk event monitor: symptoms correlate with sinus rhythm with PACs, at times runs of PACs up to 5 beats.   Pseudogout of knee, left    colchicine helpful   Psoriasis    Seasonal allergies    Seropositive rheumatoid arthritis (HCC)    Hands, knees, jaw, elbow: responding to Simponi as of 09/29/15 rheum f/u.  Waning effectiveness on 03/2016 f/u so pt switched to Orencia injections.  Doing well on chronic low-dose prednisone therapy+ methotrexate +orencia as of 01/2019, 08/2019, 11/2019     Past Surgical History:  Procedure Laterality Date   ABDOMINAL HYSTERECTOMY  1991   for fibroids; ovaries still in   BIOPSY  03/17/2021   Procedure: BIOPSY;  Surgeon: Kathi Der, MD;  Location: [facility];  Service: Gastroenterology;;   BREAST BIOPSY  2001; 02/12/16   fibrocystic breast disease in 2001 and again in 02/2016 (+scar from prev bx site w/calcifications).   CARDIOVASCULAR STRESS TEST  11/01/2017   Myocard perf imaging: NORMAL (EF >60-65%).  12/30/21 normal   CARPAL TUNNEL RELEASE Right 2024   COLONOSCOPY  09/12/2013; 02/25/20   Normal 2015 and 2021: Recall 5 yrs ([facility], Dr. Evette Cristal) due to FH of colon polyps.   CYSTOSCOPY  2004   for chronic UTI   DEXA  10/2016   Bone density normal (T-score 0.0)   ESOPHAGOGASTRODUODENOSCOPY  04/19/2011   Nl except antral gastritis: bx = mild chron gastritis (H pyloria neg)    ESOPHAGOGASTRODUODENOSCOPY (EGD) WITH PROPOFOL N/A 03/27/2020   Esoph stenosis-->dilated.  Mod size hiatal hernia (Dig Hea Spec). Procedure: ESOPHAGOGASTRODUODENOSCOPY (EGD) WITH PROPOFOL ;  Surgeon: Graylin Shiver, MD;  Location: [facility];  Service: Endoscopy;  Laterality: N/A;   ESOPHAGOGASTRODUODENOSCOPY (EGD) WITH PROPOFOL N/A 03/17/2021   Esoph stricture->dilated.  Large hiatal hernia. Also +esoph candidiasis, bx inflammation.  Procedure: ESOPHAGOGASTRODUODENOSCOPY (EGD) WITH PROPOFOL;  Surgeon: Kathi Der, MD;  Location: [facility];  Service: Gastroenterology;  Laterality: N/A;   EVENT MONITOR  10/2017   3 wk-->Symptoms correlate with sinus rhythm with PACs, at times runs of PACs up to 5 beats.   GALLBLADDER SURGERY  2019   intraarticular steroid injection  2013   3 R knee & L X 1; Dr Netta Corrigan   KNEE ARTHROCENTESIS  2013   R knee x 3 & L X 1   KNEE ARTHROSCOPY  10/21/2011   Procedure: ARTHROSCOPY KNEE;  Surgeon: Jacki Cones, MD;  [facility];  Service: Orthopedics;  Laterality: Right;  WITH MEDIAL and lateral  shaving of femoral chondyl  with microfracture technique of lateral and medial femoral chondyl suprapatellar synovectomy   LAPAROSCOPIC CHOLECYSTECTOMY  01/2018   myocardial perf imaging     Normal 12/2021  SAVORY DILATION N/A 03/27/2020   Procedure: SAVORY DILATION;  Surgeon: Graylin Shiver, MD;  Location: [facility];  Service: Endoscopy;  Laterality: N/A;   TOTAL KNEE ARTHROPLASTY  04/12/2012   RIGHT  Procedure: TOTAL KNEE ARTHROPLASTY;  Surgeon: Jacki Cones, MD;  Location: [facility];  Service: Orthopedics;  Laterality: Right;  Right Total Knee Arthroplasty   TOTAL KNEE ARTHROPLASTY Left 05/26/2021   Procedure: TOTAL KNEE ARTHROPLASTY;  Surgeon: Durene Romans, MD;  Location: [facility];  Service: Orthopedics;  Laterality: Left;   TRANSTHORACIC ECHOCARDIOGRAM  11/13/2021   EF 65-70%, mild dec RV fxn, grd I DD, o/w normal   TUBAL LIGATION  1981    Social History   Socioeconomic History   Marital status: Married    Spouse name: Not on file   Number of children: Not on file   Years of education: Not on file   Highest education level: Not on file  Occupational History   Not on file  Tobacco Use   Smoking status: Never   Smokeless tobacco: Never  Vaping Use   Vaping status: Never Used  Substance and Sexual Activity   Alcohol use: No   Drug use: No   Sexual activity: Not on file  Other Topics Concern   Not on file  Social History Narrative   Married, 3 children (2 in [place], 1 in [place]).  4 grandchildren.   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)   No tob/alc/drugs.   2 cups of coffee/day x 3 months   Regular exercise- no-due to arthritis.   Religion affecting care, "it allows stress management:"   Social Determinants of Health   Financial Resource Strain: Low Risk  (08/26/2022)   Overall Financial Resource Strain (CARDIA)    Difficulty of Paying Living Expenses: Not hard at all  Food Insecurity: No Food Insecurity (08/26/2022)   Hunger Vital Sign    Worried  About Running Out of Food in the Last Year: Never true    Ran Out of Food in the Last Year: Never true  Transportation Needs: No Transportation Needs (08/26/2022)   PRAPARE - Administrator, Civil Service (Medical): No    Lack of Transportation (Non-Medical): No  Physical Activity: Insufficiently Active (08/26/2022)   Exercise Vital Sign    Days of Exercise per Week: 1 day    Minutes of Exercise per Session: 10 min  Stress: No Stress Concern Present (08/26/2022)   [employer] of Occupational Health - Occupational Stress Questionnaire    Feeling of Stress : Only a little  Social Connections: Socially Integrated (08/26/2022)   Social Connection and Isolation Panel [NHANES]    Frequency of Communication with Friends and Family: Twice a week    Frequency of Social Gatherings with Friends and Family: Twice a week    Attends Religious Services: More than 4 times per year    Active Member of [employer] or Organizations: Yes    Attends Engineer, structural: More than 4 times per year    Marital Status: Married  Catering manager Violence: Not At Risk (08/26/2022)   Humiliation, Afraid, Rape, and Kick questionnaire    Fear of Current or Ex-Partner: No    Emotionally Abused: No    Physically Abused: No    Sexually Abused: No    Current Outpatient Medications on File Prior to Visit  Medication Sig Dispense Refill   acetaminophen (TYLENOL) 500 MG tablet Take 1,000 mg by mouth every 6 (six) hours as needed for moderate pain.  albuterol (VENTOLIN HFA) 108 (90 Base) MCG/ACT inhaler Inhale 2 puffs into the lungs every 6 (six) hours as needed for wheezing or shortness of breath. 8 g 0   amphetamine-dextroamphetamine (ADDERALL XR) 15 MG 24 hr capsule Take 1 capsule by mouth every morning. 30 capsule 0   aspirin EC 81 MG tablet Take 81 mg by mouth daily. Swallow whole.     cetirizine (ZYRTEC) 10 MG tablet Take 10 mg by mouth daily.     CYANOCOBALAMIN PO Take 1 tablet by mouth  daily.     diazepam (VALIUM) 5 MG tablet TAKE ONE TABLET BY MOUTH EVERY TWELVE HOURS AS NEEDED FOR MIGRAINE HEADACHES 60 tablet 5   Ferrous Sulfate (IRON PO) Take 1 tablet by mouth daily.     FOLIC ACID PO Take 2 mg by mouth at bedtime.     glipiZIDE (GLUCOTROL) 5 MG tablet Take 0.5-1 tablets (2.5-5 mg total) by mouth 2 (two) times daily before a meal. 180 tablet 3   insulin glargine (LANTUS SOLOSTAR) 100 UNIT/ML Solostar Pen Inject 8-10 Units into the skin at bedtime. 15 mL 3   Insulin Pen Needle 32G X 4 MM MISC Use 1x a day 100 each 3   leflunomide (ARAVA) 20 MG tablet Take 20 mg by mouth daily.     lisinopril (ZESTRIL) 20 MG tablet TAKE ONE-HALF TABLET BY MOUTH EVERY DAY 45 tablet 3   metFORMIN (GLUCOPHAGE-XR) 500 MG 24 hr tablet Take 2 tablets (1,000 mg total) by mouth 2 (two) times daily with a meal. 360 tablet 3   methotrexate 50 MG/2ML injection Inject 22.5 mg into the muscle every Monday.     metoprolol tartrate (LOPRESSOR) 25 MG tablet TAKE 1/2 TABLET (12.5mg  total) BY MOUTH TWICE DAILY 90 tablet 3   omeprazole (PRILOSEC) 40 MG capsule Take 1 capsule (40 mg total) by mouth 2 (two) times daily. 180 capsule 3   phenazopyridine (PYRIDIUM) 95 MG tablet Take 190 mg by mouth 2 (two) times daily as needed for pain. (Patient not taking: Reported on 10/14/2022)     Polyvinyl Alcohol-Povidone (REFRESH OP) Place 1 drop into both eyes 2 (two) times daily.     predniSONE (DELTASONE) 1 MG tablet Take 1 tablet (1 mg total) by mouth daily with breakfast. 120 tablet 11   predniSONE (DELTASONE) 5 MG tablet Take 1-2 tablets (5-10 mg total) by mouth daily. 180 tablet 3   primidone (MYSOLINE) 50 MG tablet Take 50-100 mg by mouth See admin instructions. Take 50 mg by mouth in the morning and 100 mg by mouth in the evening.     promethazine (PHENERGAN) 12.5 MG tablet TAKE 1-2 TABLETS BY MOUTH EVERY SIX hours AS NEEDED FOR nausea 30 tablet 2   rosuvastatin (CRESTOR) 20 MG tablet Take 1 tablet (20 mg total) by  mouth daily. 90 tablet 3   sertraline (ZOLOFT) 100 MG tablet Take 1 tablet (100 mg total) by mouth daily. 90 tablet 3   No current facility-administered medications on file prior to visit.    Allergies  Allergen Reactions   Steri-Strip Compound Benzoin [Benzoin Compound] Anaphylaxis and Itching    Blisters and itching   Penicillins Rash    Tolerated Cephalosporin 05/26/21.     Pravastatin     Myalgias   Simvastatin     Myalgias   Hydrocodone-Acetaminophen Nausea And Vomiting   Hydroxychloroquine Sulfate Rash    Family History  Problem Relation Age of Onset   Diabetes Mother    Stroke Mother 4  Osteoarthritis Mother    Heart disease Father        CABG   Lung cancer Father    Prostate cancer Father    Pancreatic cancer Father    Bipolar disorder Daughter    Arthritis Maternal Grandmother        rheumatoid   Pancreatic cancer Paternal Grandfather    Stroke Brother 69   Heart attack Brother 57       smoker   Anxiety disorder Son    Breast cancer Neg Hx    PE: BP 120/64   Pulse 78   Ht 5\' 8"  (1.727 m)   Wt 171 lb 3.2 oz (77.7 kg)   SpO2 98%   BMI 26.03 kg/m    Wt Readings from Last 3 Encounters:  03/11/23 171 lb 3.2 oz (77.7 kg)  01/20/23 170 lb (77.1 kg)  11/09/22 171 lb 12.8 oz (77.9 kg)   Constitutional: overweight, in NAD Eyes: no exophthalmos ENT: no masses palpated in neck, no cervical lymphadenopathy Cardiovascular: RRR, No MRG Respiratory: CTA B Musculoskeletal: no deformities Skin: no rashes Neurological: + tremor with outstretched hands Diabetic Foot Exam - Simple   Simple Foot Form Diabetic Foot exam was performed with the following findings: Yes 03/11/2023  2:05 PM  Visual Inspection No deformities, no ulcerations, no other skin breakdown bilaterally: Yes Sensation Testing Intact to touch and monofilament testing bilaterally: Yes Pulse Check Posterior Tibialis and Dorsalis pulse intact bilaterally: Yes Comments    ASSESSMENT: 1.  DM2, insulin-independent, uncontrolled, without long term complications, but with Hgly -She was on the freestyle libre CGM in the past but cannot afford it anymore.  2. HL  3.  Central adrenal insufficiency  4.  Right thyroid nodule  PLAN:  1. Patient with longstanding, uncontrolled, type 2 diabetes, with improved control after adding a GLP-1 receptor agonist, but unfortunately she could not tolerate a higher dose of Ozempic due to diarrhea.  She came off all of her diabetic medications afterwards, and sugars increase significantly while on prednisone.  We had to add back long-acting insulin and also metformin and sulfonylurea.  At last visit I advised her to decrease the dose of glipizide.  Sugars were mostly at goal but she did have some values in the 50s and 60s and 1 in the 200s.  A1c was slightly higher, at 7.1%, but still at goal. -At today's visit, sugars are mostly at goal but she does describe low blood sugars during the night.  However, upon questioning, she is taking a higher dose of glipizide than recommended, a full tablet twice a day, rather than half a tablet twice a day.  She does mention that her sugars were less well-controlled, but they improved after she started a magnesium supplement approximately 6 weeks ago.  At today's visit I advised her to decrease glipizide to 2.5 mg and only to take it before larger meals.  Will continue the regimen for now but I did advise her to let me know if she has more lows, in which case, we will need to reduce her Lantus dose. - I suggested to:  Patient Instructions  Please continue: - Metformin ER 1000 mg 2x daily - Lantus 8 units daily  Use: - Glipizide 2.5 mg  only before larger meals  Continue prednisone 5 mg day.  Regarding Prednisone: - You absolutely need to take this medication every day and not skip doses. - Please double the dose if you have a fever, for the duration of the  fever. - If you cannot take anything by mouth  (vomiting) or you have severe diarrhea so that you eliminate the prednisone pills in your stool, please make sure that you get steroids in the vein instead - go to the nearest emergency department/urgent care or you may go to your PCPs office   Always wear your Medalert bracelet.  Please come back for a follow-up appointment in 3-4 months.  - we checked her HbA1c: 6.9% (higher) - advised to check sugars at different times of the day - 1x a day, rotating check times - advised for yearly eye exams >> she is UTD - we we will check an ACR today - return to clinic in 3-4 months   2. HL -Reviewed lipid panel from 06/2022: Fractions at goal: Lab Results  Component Value Date   CHOL 146 06/14/2022   HDL 51.80 06/14/2022   LDLCALC 66 06/14/2022   LDLDIRECT 47.0 03/31/2021   TRIG 144.0 06/14/2022   CHOLHDL 3 06/14/2022  -She is on Crestor 10 mg daily and fish oil, with good tolerance  3. Central adrenal insufficiency  -Iatrogenic, due to steroid treatment for RA -Patient has had RA for many years, previously on 5 mg of prednisone daily and also intermittent prednisone tapers, but she stopped prednisone completely before last visit in an effort to manage her diabetes.  She started to feel very poorly, with weight loss, decreased appetite, weakness, and overall, feeling terrible.  We restarted prednisone right away. -However, she still had periods of time when she was missing her prednisone and we discussed about the absolute importance of not missing it -At last visit, she was on a slightly higher dose of prednisone, 10 mg daily.  She tried to reduce the dose to 7.5 mg daily but she was very tired on it and sleeping more so we went back to the previous dose.  We did discuss about possible side effects of steroids and I recommended to retry reducing the dose but to do a slower taper, first to 9 mg daily and then decrease by 1 mg every 2 to 3 weeks, to give her body time to tolerate the lower dose.   She agreed with the plan. -At today's visit, she is on 5 mg daily, mentioning that she was able to reduce the dose more slowly. -We again emphasized sick day rules today -At last visit she did not have her medical thyroid bracelet with her (mentioning adrenal insufficiency).  I advised her to attach it and not taking it off.  4.  Right thyroid nodule -Incidentally found on CT of the neck and soft tissues (03/13/2021): 1 cm right thyroid nodule with calcification. We checked a thyroid ultrasound (05/11/2021): Right mid 1.5 x 1.3 x 1.4 cm nodule appears to be solid, hypoechoic with macrocalcifications and the recommendation was made for biopsy.  Other nodules were subcm and not worrisome. A thyroid nodule biopsy 04/01/2022  was initially inconclusive, AUS, but the Afirma molecular marker returned 29 -Neck compression symptoms -Latest TSH was normal: Lab Results  Component Value Date   TSH 1.24 02/17/2022  -Will continue to follow her expectantly for now   Carlus Pavlov, MD PhD [facility] Endocrinology

## 2023-03-11 NOTE — Patient Instructions (Addendum)
Please continue: - Metformin ER 1000 mg 2x daily - Lantus 8 units daily  Use: - Glipizide 2.5 mg  only before larger meals  Continue prednisone 5 mg day.  Regarding Prednisone: - You absolutely need to take this medication every day and not skip doses. - Please double the dose if you have a fever, for the duration of the fever. - If you cannot take anything by mouth (vomiting) or you have severe diarrhea so that you eliminate the prednisone pills in your stool, please make sure that you get steroids in the vein instead - go to the nearest emergency department/urgent care or you may go to your PCPs office   Always wear your Medalert bracelet.  Please come back for a follow-up appointment in 3-4 months.

## 2023-03-17 NOTE — Addendum Note (Signed)
Addended by: Pollie Meyer on: 03/17/2023 11:10 AM   Modules accepted: Orders

## 2023-03-31 DIAGNOSIS — M0589 Other rheumatoid arthritis with rheumatoid factor of multiple sites: Secondary | ICD-10-CM | POA: Diagnosis not present

## 2023-04-04 LAB — HM DIABETES EYE EXAM

## 2023-04-06 ENCOUNTER — Other Ambulatory Visit: Payer: Self-pay | Admitting: Family Medicine

## 2023-04-14 ENCOUNTER — Encounter: Payer: Self-pay | Admitting: Pharmacist

## 2023-04-14 NOTE — Progress Notes (Signed)
Pharmacy Quality Measure Review  This patient is appearing on a report for being at risk of failing the adherence measure for hypertension (ACEi/ARB) medications this calendar year. Also per refill history she has not filled glipizide or metoformin on time but since she also taking insulin she is not in MAD metric.   Medication: lisinopril 20mg  Last fill date: 01/10/2023 for 90 day supply   Metformin ER 500mg  - take 2 tablets = 1000mg  twice a day - last refill was 11/01/2022 for #360 = 90 day supply  Glipizide 5mg  - take 0.5 to 1 tablet twice a day - last refill was 01/10/2023 for #180 = 90 days (it looks like at last appointment with endocrinologist dose of glipizide was lowered to 2.5mg  or 0.5 tablet only with larger meal.    Patient also mentioned in 2022 that she stopped using Freestyle Libre Continuous Glucose Monitor due to cost. This year 2024 Continuous Glucose Monitor is covered by her [place] plan. She can get at the pharmacy and should be $0.   Patient is due to have annual eye exam   Attempted to reach patient to discuss above but no answer / VM mailbox was full.    Henrene Pastor, PharmD Clinical Pharmacist [facility]  Population Health [phone]

## 2023-04-21 NOTE — Patient Instructions (Addendum)
Increase your lisinopril to WHOLE 20mg  tab daily. If blood pressure is not down to 130 over 80 in 3 days then increase to TWO of the 20mg  tabs daily.   It was very nice to see you today!   PLEASE NOTE:  If labs were collected or images ordered, we will inform you of  results once we have received them and reviewed. We will contact you either by echart message, or telephone call.     Please give ample time to the testing facility, and our office to run, receive and review results. Please do not call inquiring of results, even if you can see them in your chart. We will contact you as soon as we are able. If it has been over 1 week since the test was completed, and you have not yet heard from [place], then please call us.   If we ordered any referrals today, please let us know if you have not heard from their office within the next 2 weeks. You should receive a letter via MyChart confirming if the referral was approved and their office contact information to schedule.

## 2023-04-22 ENCOUNTER — Ambulatory Visit: Payer: Medicare (Managed Care) | Admitting: Family Medicine

## 2023-04-22 ENCOUNTER — Encounter: Payer: Self-pay | Admitting: Family Medicine

## 2023-04-22 VITALS — BP 131/84 | HR 80 | Temp 98.6°F | Ht 68.0 in | Wt 175.2 lb

## 2023-04-22 DIAGNOSIS — Z7984 Long term (current) use of oral hypoglycemic drugs: Secondary | ICD-10-CM

## 2023-04-22 DIAGNOSIS — R7401 Elevation of levels of liver transaminase levels: Secondary | ICD-10-CM | POA: Diagnosis not present

## 2023-04-22 DIAGNOSIS — E119 Type 2 diabetes mellitus without complications: Secondary | ICD-10-CM | POA: Diagnosis not present

## 2023-04-22 DIAGNOSIS — M1120 Other chondrocalcinosis, unspecified site: Secondary | ICD-10-CM | POA: Diagnosis not present

## 2023-04-22 DIAGNOSIS — I10 Essential (primary) hypertension: Secondary | ICD-10-CM

## 2023-04-22 DIAGNOSIS — Z23 Encounter for immunization: Secondary | ICD-10-CM

## 2023-04-22 DIAGNOSIS — Z79899 Other long term (current) drug therapy: Secondary | ICD-10-CM

## 2023-04-22 DIAGNOSIS — F988 Other specified behavioral and emotional disorders with onset usually occurring in childhood and adolescence: Secondary | ICD-10-CM | POA: Diagnosis not present

## 2023-04-22 LAB — BASIC METABOLIC PANEL
BUN: 22 mg/dL (ref 6–23)
CO2: 23 meq/L (ref 19–32)
Calcium: 9.1 mg/dL (ref 8.4–10.5)
Chloride: 105 meq/L (ref 96–112)
Creatinine, Ser: 0.86 mg/dL (ref 0.40–1.20)
GFR: 69.71 mL/min (ref >60.00)
Glucose, Bld: 290 mg/dL — ABNORMAL HIGH (ref 70–99)
Potassium: 4.2 meq/L (ref 3.5–5.1)
Sodium: 137 meq/L (ref 135–145)

## 2023-04-22 LAB — MAGNESIUM: Magnesium: 1.5 mg/dL (ref 1.5–2.5)

## 2023-04-22 LAB — PHOSPHORUS: Phosphorus: 4.8 mg/dL — ABNORMAL HIGH (ref 2.3–4.6)

## 2023-04-22 MED ORDER — LISINOPRIL 20 MG PO TABS
ORAL_TABLET | ORAL | 0 refills | Status: DC
Start: 2023-04-22 — End: 2023-05-27

## 2023-04-22 MED ORDER — AMPHETAMINE-DEXTROAMPHET ER 15 MG PO CP24: 15.0000 mg | ORAL_CAPSULE | Freq: Every morning | ORAL | 0 refills | Status: DC

## 2023-04-22 MED ORDER — OMEPRAZOLE 40 MG PO CPDR: 40.0000 mg | DELAYED_RELEASE_CAPSULE | Freq: Two times a day (BID) | ORAL | 1 refills | Status: DC

## 2023-04-22 NOTE — Progress Notes (Signed)
OFFICE VISIT  04/22/2023  CC:  Chief Complaint  Patient presents with   Medical Management of Chronic Issues    Pt is fasting    Patient is a 67 y.o. female who presents for 32-month follow-up hypertension and adult ADD. A/P as of last visit: "#1 hypertension, well-controlled. Continue lisinopril 20 mg tab, one half tab daily and Lopressor one half of the 25 mg tab twice daily. Will get most recent metabolic panel results from rheumatology.   2.  Hyperlipidemia, doing well on rosuvastatin 20 mg a day. Last lipids checked 06/2022 and were at goal.  Plan repeat approximately 06/2023.   #3 adult ADD. Doing well long-term on Adderall XR 15 mg a day. Controlled substance contract up-to-date.  Will update urine drug screen at next follow-up visit.   4.  Elevated transaminases.  The rheumatologist recently stopped her methotrexate and decreased her leflunomide dose.  She is repeating labs in a few weeks. Will get lab records from rheumatology. She had a period of elevation of AST and ALT back in 2022. We discussed possibly evaluating with right upper quadrant Limited abdominal ultrasound but chose to hold off at this time. We will bring up this discussion again at next follow-up."  INTERIM HX: Home bp's 150s/90s.  Mag oxide 300 mg every day helping arthritis. Leflunomide dose is 10mg  every day.  Pt states all is going well with the med at current dosing: much improved focus, concentration, task completion.  Less frustration, better multitasking, less impulsivity and restlessness.  Mood is stable. No side effects from the medication.  Rarely takes diaz-->prn headache. PMP AWARE reviewed today: most recent rx for Adderall XR 15 mg was filled 7-24, # 30, rx by me. Most recent diazepam 5 mg prescription filled 06/14/2022, #60, prescription by me. No red flags.  ROS as above, plus--> no fevers, no CP, no SOB, no wheezing, no cough, no dizziness, no HAs, no rashes, no  melena/hematochezia.   No recent changes in lower legs.  Past Medical History:  Diagnosis Date   Acute medial meniscus tear of right knee    Adrenal insufficiency (Addison's disease) (HCC) 02/2021   Anemia of chronic disease    Mild, Hb stable consistently   Anxiety and depression    Carpal tunnel syndrome    2023, bilateral   Chest pain 10/2017   +Cardiac CT.  Myocardial perfusion imaging NORMAL, EF>65%   Collagen vascular disease (HCC)    Diabetes mellitus 1995   Managed by Dr. Elvera Lennox (Endo)   GERD (gastroesophageal reflux disease) per pt watches diet and take tums as needed   with hx of prox esoph stricture + dilation procedure   H/O hiatal hernia    slightly larger ("moderate" size) on CT angio done to r/o PE 10/2017. noted on egd again 03/2021   History of adrenal insufficiency    in 2022 when she tried coming off her chronic daily prednisone for her RA   Hyperlipidemia    Hypertension    Interstitial cystitis    Lichen sclerosus of female genitalia    Migraine    Osteoarthritis, multiple sites    Palpitations 10/2017   3 wk event monitor: symptoms correlate with sinus rhythm with PACs, at times runs of PACs up to 5 beats.   Pseudogout of knee, left    colchicine helpful   Psoriasis    Seasonal allergies    Seropositive rheumatoid arthritis (HCC)    Hands, knees, jaw, elbow: responding to Simponi as of 09/29/15  rheum f/u.  Waning effectiveness on 03/2016 f/u so pt switched to Orencia injections.  Doing well on chronic low-dose prednisone therapy+ methotrexate +orencia as of 01/2019, 08/2019, 11/2019    Past Surgical History:  Procedure Laterality Date   ABDOMINAL HYSTERECTOMY  1991   for fibroids; ovaries still in   BIOPSY  03/17/2021   Procedure: BIOPSY;  Surgeon: Kathi Der, MD;  Location: [facility];  Service: Gastroenterology;;   BREAST BIOPSY  2001; 02/12/16   fibrocystic breast disease in 2001 and again in 02/2016 (+scar from prev bx site w/calcifications).    CARDIOVASCULAR STRESS TEST  11/01/2017   Myocard perf imaging: NORMAL (EF >60-65%).  12/30/21 normal   CARPAL TUNNEL RELEASE Right 2024   COLONOSCOPY  09/12/2013; 02/25/20   Normal 2015 and 2021: Recall 5 yrs ([facility], Dr. Evette Cristal) due to FH of colon polyps.   CYSTOSCOPY  2004   for chronic UTI   DEXA  10/2016   Bone density normal (T-score 0.0)   ESOPHAGOGASTRODUODENOSCOPY  04/19/2011   Nl except antral gastritis: bx = mild chron gastritis (H pyloria neg)    ESOPHAGOGASTRODUODENOSCOPY (EGD) WITH PROPOFOL N/A 03/27/2020   Esoph stenosis-->dilated.  Mod size hiatal hernia ([facility] Hea Spec). Procedure: ESOPHAGOGASTRODUODENOSCOPY (EGD) WITH PROPOFOL ;  Surgeon: Graylin Shiver, MD;  Location: [facility];  Service: Endoscopy;  Laterality: N/A;   ESOPHAGOGASTRODUODENOSCOPY (EGD) WITH PROPOFOL N/A 03/17/2021   Esoph stricture->dilated.  Large hiatal hernia. Also +esoph candidiasis, bx inflammation.  Procedure: ESOPHAGOGASTRODUODENOSCOPY (EGD) WITH PROPOFOL;  Surgeon: Kathi Der, MD;  Location: [facility];  Service: Gastroenterology;  Laterality: N/A;   EVENT MONITOR  10/2017   3 wk-->Symptoms correlate with sinus rhythm with PACs, at times runs of PACs up to 5 beats.   GALLBLADDER SURGERY  2019   intraarticular steroid injection  2013   3 R knee & L X 1; Dr Netta Corrigan   KNEE ARTHROCENTESIS  2013   R knee x 3 & L X 1   KNEE ARTHROSCOPY  10/21/2011   Procedure: ARTHROSCOPY KNEE;  Surgeon: Jacki Cones, MD;  Location: [facility];  Service: Orthopedics;  Laterality: Right;  WITH MEDIAL and lateral shaving of femoral chondyl  with microfracture technique of lateral and medial femoral chondyl suprapatellar synovectomy   LAPAROSCOPIC CHOLECYSTECTOMY  01/2018   myocardial perf imaging     Normal 12/2021   SAVORY DILATION N/A 03/27/2020   Procedure: SAVORY DILATION;  Surgeon: Graylin Shiver, MD;  Location: [facility];  Service: Endoscopy;  Laterality: N/A;   TOTAL KNEE  ARTHROPLASTY  04/12/2012   RIGHT  Procedure: TOTAL KNEE ARTHROPLASTY;  Surgeon: Jacki Cones, MD;  Location: [facility];  Service: Orthopedics;  Laterality: Right;  Right Total Knee Arthroplasty   TOTAL KNEE ARTHROPLASTY Left 05/26/2021   Procedure: TOTAL KNEE ARTHROPLASTY;  Surgeon: Durene Romans, MD;  Location: [facility];  Service: Orthopedics;  Laterality: Left;   TRANSTHORACIC ECHOCARDIOGRAM  11/13/2021   EF 65-70%, mild dec RV fxn, grd I DD, o/w normal   TUBAL LIGATION  1981    Outpatient Medications Prior to Visit  Medication Sig Dispense Refill   acetaminophen (TYLENOL) 500 MG tablet Take 1,000 mg by mouth every 6 (six) hours as needed for moderate pain.     amphetamine-dextroamphetamine (ADDERALL XR) 15 MG 24 hr capsule Take 1 capsule by mouth every morning. 30 capsule 0   aspirin EC 81 MG tablet Take 81 mg by mouth daily. Swallow whole.     BORON PO  Take by mouth daily.     cetirizine (ZYRTEC) 10 MG tablet Take 10 mg by mouth daily.     Colchicine 0.6 MG CAPS Take 2 capsules by mouth daily.     CYANOCOBALAMIN PO Take 1 tablet by mouth daily.     diazepam (VALIUM) 5 MG tablet TAKE ONE TABLET BY MOUTH EVERY TWELVE HOURS AS NEEDED FOR MIGRAINE HEADACHES 60 tablet 5   Ferrous Sulfate (IRON PO) Take 1 tablet by mouth daily.     glipiZIDE (GLUCOTROL) 5 MG tablet Take 0.5-1 tablets (2.5-5 mg total) by mouth 2 (two) times daily before a meal. 180 tablet 3   insulin glargine (LANTUS SOLOSTAR) 100 UNIT/ML Solostar Pen Inject 8-10 Units into the skin at bedtime. 15 mL 3   Insulin Pen Needle 32G X 4 MM MISC Use 1x a day 100 each 3   leflunomide (ARAVA) 20 MG tablet Take 10 mg by mouth daily.     lisinopril (ZESTRIL) 20 MG tablet TAKE ONE-HALF TABLET BY MOUTH EVERY DAY 45 tablet 3   Magnesium 300 MG CAPS Take 300 mg by mouth daily.     metFORMIN (GLUCOPHAGE-XR) 500 MG 24 hr tablet Take 2 tablets (1,000 mg total) by mouth 2 (two) times daily with a meal. 360 tablet 3   metoprolol tartrate  (LOPRESSOR) 25 MG tablet TAKE 1/2 TABLET (12.5mg  total) BY MOUTH TWICE DAILY 90 tablet 3   omeprazole (PRILOSEC) 40 MG capsule Take 1 capsule (40 mg total) by mouth 2 (two) times daily. 180 capsule 3   phenazopyridine (PYRIDIUM) 95 MG tablet Take 190 mg by mouth 2 (two) times daily as needed for pain.     Polyvinyl Alcohol-Povidone (REFRESH OP) Place 1 drop into both eyes 2 (two) times daily.     predniSONE (DELTASONE) 5 MG tablet Take 1-2 tablets (5-10 mg total) by mouth daily. 180 tablet 3   primidone (MYSOLINE) 50 MG tablet Take 50-100 mg by mouth See admin instructions. Take 50 mg by mouth in the morning and 100 mg by mouth in the evening.     promethazine (PHENERGAN) 12.5 MG tablet TAKE 1-2 TABLETS BY MOUTH EVERY SIX hours AS NEEDED FOR nausea 30 tablet 2   rosuvastatin (CRESTOR) 20 MG tablet Take 1 tablet (20 mg total) by mouth daily. 90 tablet 3   sertraline (ZOLOFT) 100 MG tablet Take 1 tablet (100 mg total) by mouth daily. 90 tablet 3   albuterol (VENTOLIN HFA) 108 (90 Base) MCG/ACT inhaler Inhale 2 puffs into the lungs every 6 (six) hours as needed for wheezing or shortness of breath. (Patient not taking: Reported on 04/22/2023) 8 g 0   FOLIC ACID PO Take 2 mg by mouth at bedtime. (Patient not taking: Reported on 04/22/2023)     methotrexate 50 MG/2ML injection Inject 22.5 mg into the muscle every Monday. (Patient not taking: Reported on 04/22/2023)     No facility-administered medications prior to visit.    Allergies  Allergen Reactions   Steri-Strip Compound Benzoin [Benzoin] Anaphylaxis and Itching    Blisters and itching   Penicillins Rash    Tolerated Cephalosporin 05/26/21.     Pravastatin     Myalgias   Simvastatin     Myalgias   Hydrocodone-Acetaminophen Nausea And Vomiting   Hydroxychloroquine Sulfate Rash    Review of Systems As per HPI  PE:    04/22/2023   10:42 AM 03/11/2023    1:49 PM 01/20/2023   11:25 AM  Vitals with BMI  Height 5'  8" 5\' 8"    Weight  175 lbs 3 oz 171 lbs 3 oz 170 lbs  BMI 26.65 26.04   Systolic 131 120 469  Diastolic 84 64 78  Pulse 80 78 76     Physical Exam  Gen: Alert, well appearing.  Patient is oriented to person, place, time, and situation. AFFECT: pleasant, lucid thought and speech. No further exam today.  LABS:  Last CBC Lab Results  Component Value Date   WBC 7.4 02/17/2022   HGB 11.3 (L) 02/17/2022   HCT 34.7 (L) 02/17/2022   MCV 76.5 (L) 02/17/2022   MCH 27.5 05/27/2021   RDW 16.8 (H) 02/17/2022   PLT 327.0 02/17/2022   Lab Results  Component Value Date   IRON 30 (L) 11/02/2021   TIBC 375 11/02/2021   FERRITIN 53 11/02/2021   Last metabolic panel Lab Results  Component Value Date   GLUCOSE 66 (L) 06/14/2022   NA 137 06/14/2022   K 4.5 06/14/2022   CL 103 06/14/2022   CO2 24 06/14/2022   BUN 16 06/14/2022   CREATININE 0.62 06/14/2022   GFR 92.44 06/14/2022   CALCIUM 8.4 06/14/2022   PROT 6.5 02/17/2022   ALBUMIN 3.8 02/17/2022   BILITOT 0.4 02/17/2022   ALKPHOS 79 02/17/2022   AST 16 02/17/2022   ALT 17 02/17/2022   ANIONGAP 7 05/27/2021   Lab Results  Component Value Date   CALCIUM 8.4 06/14/2022   Last lipids Lab Results  Component Value Date   CHOL 146 06/14/2022   HDL 51.80 06/14/2022   LDLCALC 66 06/14/2022   LDLDIRECT 47.0 03/31/2021   TRIG 144.0 06/14/2022   CHOLHDL 3 06/14/2022   Last hemoglobin A1c Lab Results  Component Value Date   HGBA1C 6.9 (A) 03/11/2023   Last thyroid functions Lab Results  Component Value Date   TSH 1.24 02/17/2022   Last vitamin B12 and Folate Lab Results  Component Value Date   VITAMINB12 267 08/05/2021   FOLATE 9.2 08/05/2021   IMPRESSION AND PLAN:   1) uncontrolled HTN. Increase toprol xl to 1 whole 25mg  tab every day.  May increase to 2 tabs every day if bp not normalizing. Cont lisinopril 1/2 20mg  tab every day. Lytes/cr today.  2) Adult ADD, stable on adderall xr 15 every day.  3) Hyperlipidemia, doing well  on rosuvastatin 20 mg a day. Last lipids checked 06/2022 and were at goal.  Plan when pt fasting.  4) Elevated transaminases.  The rheumatologist recently stopped her methotrexate and decreased her leflunomide dose.  Aug 2024 hepatic panel normal. Hepatic steatosis on u/s at that time.  An After Visit Summary was printed and given to the patient.  FOLLOW UP: 2 wk f/u HT N Next CPE 10/2023 Signed:  Santiago Bumpers, MD           04/22/2023

## 2023-04-23 LAB — DRUG MONITORING PANEL 376104, URINE
Amphetamines: NEGATIVE ng/mL (ref <500)
Barbiturates: NEGATIVE ng/mL (ref <300)
Benzodiazepines: NEGATIVE ng/mL (ref <100)
Cocaine Metabolite: NEGATIVE ng/mL (ref <150)
Desmethyltramadol: NEGATIVE ng/mL (ref <100)
Opiates: NEGATIVE ng/mL (ref <100)
Oxycodone: NEGATIVE ng/mL (ref <100)
Tramadol: NEGATIVE ng/mL (ref <100)

## 2023-04-23 LAB — PTH, INTACT AND CALCIUM
Calcium: 9.1 mg/dL (ref 8.6–10.4)
PTH: 60 pg/mL (ref 16–77)

## 2023-04-23 LAB — DM TEMPLATE

## 2023-04-28 ENCOUNTER — Encounter: Payer: Self-pay | Admitting: Pharmacist

## 2023-04-28 DIAGNOSIS — Z79899 Other long term (current) drug therapy: Secondary | ICD-10-CM | POA: Diagnosis not present

## 2023-04-28 DIAGNOSIS — M0589 Other rheumatoid arthritis with rheumatoid factor of multiple sites: Secondary | ICD-10-CM | POA: Diagnosis not present

## 2023-04-28 NOTE — Progress Notes (Signed)
Pharmacy Quality Measure Review  This patient is appearing on a report for being at risk of failing the adherence measure for hypertension (ACEi/ARB) medications this calendar year.   Medication: lisinopril 20mg  Last fill date: 01/10/2023 for 90 day supply  Patient was seen by Raven [name] 04/22/2023 and dose of lisinopril was increased from 20mg  take 0.5 tablet daily to take 1 tablet daily.  Patient was instructed to check blood pressure at home and if after 3 days blood pressure was > 130/80 she was to increase lisinopril 20mg  to take 2 tablets daily.  Called to see if patient had picked up Rx from last week from [facility] - per pharm tech Raven [name] has not pick up new Rx yet.  Called patient to remind her about new prescription.  Raven [name] reports that she is taking lisinopril 20mg  daily and blood pressure has been <130/80 since dose was increase.  She will follow up with Raven [name] 05/06/2023.     Henrene Pastor, PharmD Clinical Pharmacist [facility]  Population Health [phone]

## 2023-05-06 ENCOUNTER — Ambulatory Visit: Payer: Medicare (Managed Care) | Admitting: Family Medicine

## 2023-05-11 ENCOUNTER — Other Ambulatory Visit: Payer: Self-pay | Admitting: Internal Medicine

## 2023-05-18 ENCOUNTER — Encounter: Payer: Self-pay | Admitting: Family Medicine

## 2023-05-27 ENCOUNTER — Encounter: Payer: Self-pay | Admitting: Family Medicine

## 2023-05-27 ENCOUNTER — Ambulatory Visit (INDEPENDENT_AMBULATORY_CARE_PROVIDER_SITE_OTHER): Payer: Medicare (Managed Care) | Admitting: Family Medicine

## 2023-05-27 VITALS — BP 126/85 | HR 74 | Wt 175.2 lb

## 2023-05-27 DIAGNOSIS — H833X3 Noise effects on inner ear, bilateral: Secondary | ICD-10-CM

## 2023-05-27 DIAGNOSIS — H903 Sensorineural hearing loss, bilateral: Secondary | ICD-10-CM | POA: Diagnosis not present

## 2023-05-27 DIAGNOSIS — I10 Essential (primary) hypertension: Secondary | ICD-10-CM

## 2023-05-27 MED ORDER — LISINOPRIL 40 MG PO TABS: 40.0000 mg | ORAL_TABLET | Freq: Every day | ORAL | 3 refills | Status: DC
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
## 2023-06-10 ENCOUNTER — Encounter: Payer: Self-pay | Admitting: Family Medicine

## 2023-06-13 MED ORDER — CLOTRIMAZOLE-BETAMETHASONE 1-0.05 % EX CREA: 1.0000 | TOPICAL_CREAM | Freq: Every day | CUTANEOUS | 0 refills | Status: DC

## 2023-06-13 NOTE — Telephone Encounter (Signed)
Hi Raven [name], Fortunately this does not look like shingles. This is intertrigo--> rash due to some yeast as well as friction and moisture and heat in these areas. I will send in a cream to apply.

## 2023-06-23 DIAGNOSIS — H906 Mixed conductive and sensorineural hearing loss, bilateral: Secondary | ICD-10-CM | POA: Diagnosis not present

## 2023-06-23 DIAGNOSIS — G43909 Migraine, unspecified, not intractable, without status migrainosus: Secondary | ICD-10-CM | POA: Diagnosis not present

## 2023-06-23 DIAGNOSIS — M0589 Other rheumatoid arthritis with rheumatoid factor of multiple sites: Secondary | ICD-10-CM | POA: Diagnosis not present

## 2023-06-23 DIAGNOSIS — H9313 Tinnitus, bilateral: Secondary | ICD-10-CM | POA: Diagnosis not present

## 2023-07-10 ENCOUNTER — Encounter: Payer: Self-pay | Admitting: Family Medicine

## 2023-07-11 ENCOUNTER — Other Ambulatory Visit: Payer: Self-pay | Admitting: Cardiology

## 2023-07-11 ENCOUNTER — Encounter: Payer: Self-pay | Admitting: Internal Medicine

## 2023-07-11 ENCOUNTER — Ambulatory Visit (INDEPENDENT_AMBULATORY_CARE_PROVIDER_SITE_OTHER): Payer: Medicare (Managed Care) | Admitting: Internal Medicine

## 2023-07-11 VITALS — BP 138/80 | HR 89 | Ht 68.0 in | Wt 171.0 lb

## 2023-07-11 DIAGNOSIS — E785 Hyperlipidemia, unspecified: Secondary | ICD-10-CM | POA: Diagnosis not present

## 2023-07-11 DIAGNOSIS — Z7985 Long-term (current) use of injectable non-insulin antidiabetic drugs: Secondary | ICD-10-CM

## 2023-07-11 DIAGNOSIS — E1165 Type 2 diabetes mellitus with hyperglycemia: Secondary | ICD-10-CM

## 2023-07-11 DIAGNOSIS — Z7984 Long term (current) use of oral hypoglycemic drugs: Secondary | ICD-10-CM

## 2023-07-11 DIAGNOSIS — E2749 Other adrenocortical insufficiency: Secondary | ICD-10-CM

## 2023-07-11 LAB — POCT GLYCOSYLATED HEMOGLOBIN (HGB A1C): Hemoglobin A1C: 10 % — AB (ref 4.0–5.6)

## 2023-07-11 LAB — GLUCOSE, POCT (MANUAL RESULT ENTRY): POC Glucose: 303 mg/dL — AB (ref 70–99)

## 2023-07-11 MED ORDER — FREESTYLE LIBRE 3 READER DEVI: 1.0000 | Freq: Once | 0 refills | Status: AC

## 2023-07-11 MED ORDER — LANTUS SOLOSTAR 100 UNIT/ML ~~LOC~~ SOPN: 15.0000 [IU] | PEN_INJECTOR | Freq: Every day | SUBCUTANEOUS | 3 refills | Status: DC

## 2023-07-11 MED ORDER — FREESTYLE LIBRE 3 PLUS SENSOR MISC: 1.0000 | 3 refills | Status: AC

## 2023-07-11 NOTE — Patient Instructions (Addendum)
Please continue: - Metformin ER 1000 mg 2x daily  Increase: - Glipizide 5 mg before b'fast and 2.5 mg before dinner - Lantus 15 units daily  Try to change your test strips and may also need to change your meter.  Continue prednisone 5 mg day.  Regarding Prednisone: - You absolutely need to take this medication every day and not skip doses. - Please double the dose if you have a fever, for the duration of the fever. - If you cannot take anything by mouth (vomiting) or you have severe diarrhea so that you eliminate the prednisone pills in your stool, please make sure that you get steroids in the vein instead - go to the nearest emergency department/urgent care or you may go to your PCPs office   Always wear your Medalert bracelet.  Please come back for a follow-up appointment in 1-1.5 months.
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
## 2023-07-12 LAB — LIPID PANEL W/REFLEX DIRECT LDL
Cholesterol: 273 mg/dL — ABNORMAL HIGH (ref <200)
HDL: 39 mg/dL — ABNORMAL LOW (ref >=50)
LDL Cholesterol (Calc): 192 mg/dL — ABNORMAL HIGH
Non-HDL Cholesterol (Calc): 234 mg/dL — ABNORMAL HIGH (ref <130)
Total CHOL/HDL Ratio: 7 (calc) — ABNORMAL HIGH (ref <5.0)
Triglycerides: 225 mg/dL — ABNORMAL HIGH (ref <150)
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
## 2023-08-24 ENCOUNTER — Ambulatory Visit (INDEPENDENT_AMBULATORY_CARE_PROVIDER_SITE_OTHER): Payer: Medicare Other | Admitting: Internal Medicine

## 2023-08-24 ENCOUNTER — Encounter: Payer: Self-pay | Admitting: Internal Medicine

## 2023-08-24 VITALS — BP 126/70 | HR 84 | Ht 68.0 in | Wt 171.2 lb

## 2023-08-24 DIAGNOSIS — E2749 Other adrenocortical insufficiency: Secondary | ICD-10-CM | POA: Diagnosis not present

## 2023-08-24 DIAGNOSIS — Z7984 Long term (current) use of oral hypoglycemic drugs: Secondary | ICD-10-CM

## 2023-08-24 DIAGNOSIS — E785 Hyperlipidemia, unspecified: Secondary | ICD-10-CM

## 2023-08-24 DIAGNOSIS — E1165 Type 2 diabetes mellitus with hyperglycemia: Secondary | ICD-10-CM | POA: Diagnosis not present

## 2023-08-24 NOTE — Patient Instructions (Addendum)
Please continue: - Metformin ER 1000 mg 2x daily - Glipizide 2.5 mg before b'fast and 2.5 mg before dinner  Stop Lantus.  Let me know if the sugars are still dropping after stopping insulin.  Continue prednisone 5 mg day.  Regarding Prednisone: - You absolutely need to take this medication every day and not skip doses. - Please double the dose if you have a fever, for the duration of the fever. - If you cannot take anything by mouth (vomiting) or you have severe diarrhea so that you eliminate the prednisone pills in your stool, please make sure that you get steroids in the vein instead - go to the nearest emergency department/urgent care or you may go to your PCPs office   Please come back for a follow-up appointment in 3 months.

## 2023-08-29 ENCOUNTER — Other Ambulatory Visit (HOSPITAL_COMMUNITY): Payer: Self-pay | Admitting: Physical Medicine and Rehabilitation

## 2023-08-29 DIAGNOSIS — H903 Sensorineural hearing loss, bilateral: Secondary | ICD-10-CM

## 2023-08-31 ENCOUNTER — Ambulatory Visit (INDEPENDENT_AMBULATORY_CARE_PROVIDER_SITE_OTHER): Payer: Medicare Other | Admitting: *Deleted

## 2023-08-31 DIAGNOSIS — Z0000 Encounter for general adult medical examination without abnormal findings: Secondary | ICD-10-CM | POA: Diagnosis not present

## 2023-08-31 NOTE — Progress Notes (Signed)
 Subjective:   Raven [name] is a 68 y.o. female who presents for Medicare Annual (Subsequent) preventive examination.  Visit Complete: Virtual I connected with  [name] on 08/31/23 by a audio enabled telemedicine application and verified that I am speaking with the correct person using two identifiers.  Patient Location: Home  Provider Location: Home Office  I discussed the limitations of evaluation and management by telemedicine. The patient expressed understanding and agreed to proceed.  Vital Signs: Because this visit was a virtual/telehealth visit, some criteria may be missing or patient reported. Any vitals not documented were not able to be obtained and vitals that have been documented are patient reported.  Cardiac Risk Factors include: advanced age (>75men, >73 women);diabetes mellitus;obesity (BMI >30kg/m2)     Objective:    Today's Vitals   08/31/23 1014  PainSc: 6    There is no height or weight on file to calculate BMI.     08/31/2023   10:20 AM 08/26/2022    2:13 PM 05/26/2021    6:00 PM 05/26/2021    7:58 AM 03/17/2021    7:58 AM 02/19/2021    9:14 AM 03/27/2020   10:56 AM  Advanced Directives  Does Patient Have a Medical Advance Directive? Yes Yes No No Yes Yes Yes  Type of Printmaker Power of [employer] Power of Attorney Healthcare Power of Attorney  Does patient want to make changes to medical advance directive?  No - Patient declined       Copy of Healthcare Power of Attorney in Chart? No - copy requested No - copy requested   Yes - validated most recent copy scanned in chart (See row information) No - copy requested No - copy requested  Would patient like information on creating a medical advance directive?   No - Patient declined No - Patient declined       Current Medications (verified) Outpatient Encounter Medications as of 08/31/2023  Medication Sig    acetaminophen (TYLENOL) 500 MG tablet Take 1,000 mg by mouth every 6 (six) hours as needed for moderate pain.   albuterol (VENTOLIN HFA) 108 (90 Base) MCG/ACT inhaler Inhale 2 puffs into the lungs every 6 (six) hours as needed for wheezing or shortness of breath.   amphetamine-dextroamphetamine (ADDERALL XR) 15 MG 24 hr capsule Take 1 capsule by mouth every morning.   aspirin EC 81 MG tablet Take 81 mg by mouth daily. Swallow whole.   BORON PO Take by mouth daily.   cetirizine (ZYRTEC) 10 MG tablet Take 10 mg by mouth daily.   clotrimazole-betamethasone (LOTRISONE) cream Apply 1 Application topically daily.   Colchicine 0.6 MG CAPS Take 2 capsules by mouth daily.   Continuous Glucose Sensor (FREESTYLE LIBRE 3 PLUS SENSOR) MISC 1 each by Does not apply route every 14 (fourteen) days.   CYANOCOBALAMIN PO Take 1 tablet by mouth daily.   diazepam (VALIUM) 5 MG tablet TAKE ONE TABLET BY MOUTH EVERY TWELVE HOURS AS NEEDED FOR MIGRAINE HEADACHES   Ferrous Sulfate (IRON PO) Take 1 tablet by mouth daily.   glipiZIDE (GLUCOTROL) 5 MG tablet Take 0.5-1 tablets (2.5-5 mg total) by mouth 2 (two) times daily before a meal.   Insulin Pen Needle 32G X 4 MM MISC Use 1x a day   leflunomide (ARAVA) 20 MG tablet Take 10 mg by mouth daily.   lisinopril (ZESTRIL) 40 MG tablet Take 1 tablet (40 mg total)  by mouth daily.   Magnesium 300 MG CAPS Take 300 mg by mouth daily.   metFORMIN (GLUCOPHAGE-XR) 500 MG 24 hr tablet Take 2 tablets (1,000 mg total) by mouth 2 (two) times daily with a meal.   metoprolol tartrate (LOPRESSOR) 25 MG tablet TAKE 1/2 TABLET (12.5mg  total) BY MOUTH TWICE DAILY   omeprazole (PRILOSEC) 40 MG capsule Take 1 capsule (40 mg total) by mouth 2 (two) times daily.   phenazopyridine (PYRIDIUM) 95 MG tablet Take 190 mg by mouth 2 (two) times daily as needed for pain.   Polyvinyl Alcohol-Povidone (REFRESH OP) Place 1 drop into both eyes 2 (two) times daily.   predniSONE (DELTASONE) 5 MG tablet Take  1-2 tablets (5-10 mg total) by mouth daily.   primidone (MYSOLINE) 50 MG tablet Take 50-100 mg by mouth See admin instructions. Take 50 mg by mouth in the morning and 100 mg by mouth in the evening.   promethazine (PHENERGAN) 12.5 MG tablet TAKE 1-2 TABLETS BY MOUTH EVERY SIX hours AS NEEDED FOR nausea   rosuvastatin (CRESTOR) 20 MG tablet Take 1 tablet (20 mg total) by mouth daily.   sertraline (ZOLOFT) 100 MG tablet Take 1 tablet (100 mg total) by mouth daily.   No facility-administered encounter medications on file as of 08/31/2023.    Allergies (verified) Steri-strip compound benzoin [benzoin], Penicillins, Pravastatin, Simvastatin, Hydrocodone-acetaminophen, and Hydroxychloroquine sulfate   History: Past Medical History:  Diagnosis Date   Acute medial meniscus tear of right knee    Adrenal insufficiency (Addison's disease) (HCC) 02/2021   Anemia of chronic disease    Mild, Hb stable consistently   Anxiety and depression    Carpal tunnel syndrome    2023, bilateral   Chest pain 10/2017   +Cardiac CT.  Myocardial perfusion imaging NORMAL, EF>65%   Collagen vascular disease (HCC)    Diabetes mellitus 1995   Managed by Dr. Elvera Lennox (Endo).  +retinopathy   GERD (gastroesophageal reflux disease) per pt watches diet and take tums as needed   with hx of prox esoph stricture + dilation procedure   H/O hiatal hernia    slightly larger ("moderate" size) on CT angio done to r/o PE 10/2017. noted on egd again 03/2021   Hepatic steatosis    02/2023 ultrasound   History of adrenal insufficiency    in 2022 when she tried coming off her chronic daily prednisone for her RA   Hyperlipidemia    Hypertension    Interstitial cystitis    Lichen sclerosus of female genitalia    Migraine    Osteoarthritis, multiple sites    Palpitations 10/2017   3 wk event monitor: symptoms correlate with sinus rhythm with PACs, at times runs of PACs up to 5 beats.   Pseudogout of knee, left    colchicine helpful    Psoriasis    Seasonal allergies    Seropositive rheumatoid arthritis (HCC)    Hands, knees, jaw, elbow: responding to Simponi as of 09/29/15 rheum f/u.  Waning effectiveness on 03/2016 f/u so pt switched to Orencia injections.  Doing well on chronic low-dose prednisone therapy+ methotrexate +orencia as of 01/2019, 08/2019, 11/2019   Past Surgical History:  Procedure Laterality Date   ABDOMINAL HYSTERECTOMY  1991   for fibroids; ovaries still in   BIOPSY  03/17/2021   Procedure: BIOPSY;  Surgeon: Kathi Der, MD;  Location: [facility];  Service: Gastroenterology;;   BREAST BIOPSY  2001; 02/12/16   fibrocystic breast disease in 2001 and again in 02/2016 (+scar  from prev bx site w/calcifications).   CARDIOVASCULAR STRESS TEST  11/01/2017   Myocard perf imaging: NORMAL (EF >60-65%).  12/30/21 normal   CARPAL TUNNEL RELEASE Right 2024   COLONOSCOPY  09/12/2013; 02/25/20   Normal 2015 and 2021: Recall 5 yrs ([facility], Dr. Evette Cristal) due to FH of colon polyps.   CYSTOSCOPY  2004   for chronic UTI   DEXA  10/2016   Bone density normal (T-score 0.0)   ESOPHAGOGASTRODUODENOSCOPY  04/19/2011   Nl except antral gastritis: bx = mild chron gastritis (H pyloria neg)    ESOPHAGOGASTRODUODENOSCOPY (EGD) WITH PROPOFOL N/A 03/27/2020   Esoph stenosis-->dilated.  Mod size hiatal hernia (Dig Hea Spec). Procedure: ESOPHAGOGASTRODUODENOSCOPY (EGD) WITH PROPOFOL ;  Surgeon: Graylin Shiver, MD;  Location: [facility];  Service: Endoscopy;  Laterality: N/A;   ESOPHAGOGASTRODUODENOSCOPY (EGD) WITH PROPOFOL N/A 03/17/2021   Esoph stricture->dilated.  Large hiatal hernia. Also +esoph candidiasis, bx inflammation.  Procedure: ESOPHAGOGASTRODUODENOSCOPY (EGD) WITH PROPOFOL;  Surgeon: Kathi Der, MD;  Location: [facility];  Service: Gastroenterology;  Laterality: N/A;   EVENT MONITOR  10/2017   3 wk-->Symptoms correlate with sinus rhythm with PACs, at times runs of PACs up to 5 beats.   GALLBLADDER SURGERY   2019   intraarticular steroid injection  2013   3 R knee & L X 1; Dr Netta Corrigan   KNEE ARTHROCENTESIS  2013   R knee x 3 & L X 1   KNEE ARTHROSCOPY  10/21/2011   Procedure: ARTHROSCOPY KNEE;  Surgeon: Jacki Cones, MD;  Location: [facility];  Service: Orthopedics;  Laterality: Right;  WITH MEDIAL and lateral shaving of femoral chondyl  with microfracture technique of lateral and medial femoral chondyl suprapatellar synovectomy   LAPAROSCOPIC CHOLECYSTECTOMY  01/2018   myocardial perf imaging     Normal 12/2021   SAVORY DILATION N/A 03/27/2020   Procedure: SAVORY DILATION;  Surgeon: Graylin Shiver, MD;  Location: [facility];  Service: Endoscopy;  Laterality: N/A;   TOTAL KNEE ARTHROPLASTY  04/12/2012   RIGHT  Procedure: TOTAL KNEE ARTHROPLASTY;  Surgeon: Jacki Cones, MD;  Location: [facility];  Service: Orthopedics;  Laterality: Right;  Right Total Knee Arthroplasty   TOTAL KNEE ARTHROPLASTY Left 05/26/2021   Procedure: TOTAL KNEE ARTHROPLASTY;  Surgeon: Durene Romans, MD;  Location: [facility];  Service: Orthopedics;  Laterality: Left;   TRANSTHORACIC ECHOCARDIOGRAM  11/13/2021   EF 65-70%, mild dec RV fxn, grd I DD, o/w normal   TUBAL LIGATION  1981   Family History  Problem Relation Age of Onset   Diabetes Mother    Stroke Mother 61   Osteoarthritis Mother    Heart disease Father        CABG   Lung cancer Father    Prostate cancer Father    Pancreatic cancer Father    Bipolar disorder Daughter    Arthritis Maternal Grandmother        rheumatoid   Pancreatic cancer Paternal Grandfather    Stroke Brother 42   Heart attack Brother 10       smoker   Anxiety disorder Son    Breast cancer Neg Hx    Social History   Socioeconomic History   Marital status: Married    Spouse name: Not on file   Number of children: Not on file   Years of education: Not on file   Highest education level: Not on file  Occupational History   Not on file  Tobacco Use  Smoking  status: Never   Smokeless tobacco: Never  Vaping Use   Vaping status: Never Used  Substance and Sexual Activity   Alcohol use: No   Drug use: No   Sexual activity: Not Currently  Other Topics Concern   Not on file  Social History Narrative   Married, 3 children (2 in [place], 1 in [place]).  4 grandchildren.   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)   No tob/alc/drugs.   2 cups of coffee/day x 3 months   Regular exercise- no-due to arthritis.   Religion affecting care, "it allows stress management:"   Social Drivers of Health   Financial Resource Strain: Low Risk  (08/31/2023)   Overall Financial Resource Strain (CARDIA)    Difficulty of Paying Living Expenses: Not hard at all  Food Insecurity: No Food Insecurity (08/31/2023)   Hunger Vital Sign    Worried About Running Out of Food in the Last Year: Never true    Ran Out of Food in the Last Year: Never true  Transportation Needs: No Transportation Needs (08/31/2023)   PRAPARE - Administrator, Civil Service (Medical): No    Lack of Transportation (Non-Medical): No  Physical Activity: Inactive (08/31/2023)   Exercise Vital Sign    Days of Exercise per Week: 0 days    Minutes of Exercise per Session: 0 min  Stress: No Stress Concern Present (08/31/2023)   [employer] of Occupational Health - Occupational Stress Questionnaire    Feeling of Stress : Not at all  Social Connections: Socially Integrated (08/31/2023)   Social Connection and Isolation Panel [NHANES]    Frequency of Communication with Friends and Family: Once a week    Frequency of Social Gatherings with Friends and Family: Three times a week    Attends Religious Services: More than 4 times per year    Active Member of Clubs or Organizations: Yes    Attends Engineer, structural: More than 4 times per year    Marital Status: Married    Tobacco Counseling Counseling given: Not Answered   Clinical Intake:  Pre-visit preparation  completed: Yes  Pain : 0-10 Pain Score: 6  Pain Type: Chronic pain Pain Location: Knee (all joints/  arthritis) Pain Descriptors / Indicators: Burning, Aching, Constant, Dull Pain Onset: More than a month ago Pain Frequency: Constant     Diabetes: Yes CBG done?: No Did pt. bring in CBG monitor from home?: No  How often do you need to have someone help you when you read instructions, pamphlets, or other written materials from your doctor or pharmacy?: 1 - Never  Interpreter Needed?: No  Information entered by :: Remi Haggard LPN   Activities of Daily Living    08/31/2023   10:20 AM  In your present state of health, do you have any difficulty performing the following activities:  Hearing? 1  Vision? 0  Difficulty concentrating or making decisions? 1  Walking or climbing stairs? 0  Dressing or bathing? 0  Doing errands, shopping? 0  Preparing Food and eating ? N  Using the Toilet? N  In the past six months, have you accidently leaked urine? Y  Do you have problems with loss of bowel control? N  Managing your Medications? N  Managing your Finances? N  Housekeeping or managing your Housekeeping? N    Patient Care Team: Jeoffrey Massed, MD as PCP - General (Family Medicine) Wyline Mood Dorothe Pea, MD as PCP - Cardiology (Cardiology) Zenovia Jordan, MD as  Consulting Physician (Rheumatology) Carlus Pavlov, MD as Consulting Physician (Endocrinology) Haroldine Laws, MD as Referring Physician (Neurology) Kathi Der, MD as Consulting Physician (Gastroenterology) Durene Romans, MD as Consulting Physician (Orthopedic Surgery) Bradly Bienenstock, MD as Consulting Physician (Orthopedic Surgery)  Indicate any recent Medical Services you may have received from other than [facility] providers in the past year (date may be approximate).     Assessment:   This is a routine wellness examination for Marylene.  Hearing/Vision screen Hearing Screening - Comments:: Has hearing test  scheduled Will be getting hearing aids Vision Screening - Comments:: Up to date  Eye Doctor   Goals Addressed             This Visit's Progress    Weight (lb) < 200 lb (90.7 kg)         Depression Screen    08/31/2023   10:22 AM 05/27/2023   10:06 AM 04/22/2023   10:57 AM 01/20/2023   11:29 AM 08/26/2022    2:05 PM 11/02/2021    9:38 AM 08/04/2021   10:58 AM  PHQ 2/9 Scores  PHQ - 2 Score 0 2 2 1 1 2  0  PHQ- 9 Score 1 3 3 5   2     Fall Risk    08/31/2023   10:16 AM 05/27/2023   10:06 AM 04/22/2023   10:57 AM 01/20/2023   11:28 AM 08/26/2022    2:05 PM  Fall Risk   Falls in the past year? 0 1 1 1 1   Number falls in past yr: 0 0 0 1 0  Injury with Fall? 0 0 0 0 0  Risk for fall due to :   No Fall Risks No Fall Risks History of fall(s)  Follow up Falls evaluation completed;Education provided;Falls prevention discussed Falls evaluation completed Falls evaluation completed Falls evaluation completed Falls evaluation completed    MEDICARE RISK AT HOME: Medicare Risk at Home Any stairs in or around the home?: Yes If so, are there any without handrails?: No Home free of loose throw rugs in walkways, pet beds, electrical cords, etc?: Yes Adequate lighting in your home to reduce risk of falls?: Yes Life alert?: No Use of a cane, walker or w/c?: No Grab bars in the bathroom?: Yes Shower chair or bench in shower?: Yes Elevated toilet seat or a handicapped toilet?: No  TIMED UP AND GO:  Was the test performed?  No    Cognitive Function:        08/31/2023   10:17 AM 08/26/2022    2:19 PM  6CIT Screen  What Year? 0 points 0 points  What month? 0 points 0 points  What time? 0 points 0 points  Count back from 20 0 points 0 points  Months in reverse 0 points 0 points  Repeat phrase 0 points 4 points  Total Score 0 points 4 points    Immunizations Immunization History  Administered Date(s) Administered   Fluad Trivalent(High Dose 65+) 04/22/2023   Influenza Whole  05/08/2008, 03/23/2010, 06/11/2012   Influenza,inj,Quad PF,6+ Mos 04/29/2014, 07/15/2015, 04/19/2017, 02/24/2018, 03/21/2020   Influenza-Unspecified 04/11/2013, 04/11/2016   PFIZER Comirnaty(Gray Top)Covid-19 Tri-Sucrose Vaccine 11/17/2020   PFIZER(Purple Top)SARS-COV-2 Vaccination 09/21/2019, 10/17/2019, 04/17/2020   Pfizer Covid-19 Vaccine Bivalent Booster 34yrs & up 04/30/2021   Pneumococcal Conjugate-13 09/22/2020   Pneumococcal Polysaccharide-23 01/14/2016   Tdap 04/21/2015   Zoster Recombinant(Shingrix) 02/24/2018, 08/18/2018    TDAP status: Up to date  Flu Vaccine status: Up to date  Pneumococcal vaccine status: Due,  Education has been provided regarding the importance of this vaccine. Advised may receive this vaccine at local pharmacy or Health Dept. Aware to provide a copy of the vaccination record if obtained from local pharmacy or Health Dept. Verbalized acceptance and understanding.  Covid-19 vaccine status: Information provided on how to obtain vaccines.   Qualifies for Shingles Vaccine? Yes   Zostavax completed Yes   Shingrix Completed?: Yes  Screening Tests Health Maintenance  Topic Date Due   Pneumonia Vaccine 65+ Years old (3 of 3 - PPSV23 or PCV20) 09/22/2021   COVID-19 Vaccine (6 - 2024-25 season) 03/13/2023   HEMOGLOBIN A1C  01/09/2024   Diabetic kidney evaluation - Urine ACR  03/10/2024   FOOT EXAM  03/10/2024   OPHTHALMOLOGY EXAM  04/03/2024   Diabetic kidney evaluation - eGFR measurement  04/21/2024   Medicare Annual Wellness (AWV)  08/30/2024   MAMMOGRAM  02/20/2025   Colonoscopy  02/24/2025   DTaP/Tdap/Td (2 - Td or Tdap) 04/20/2025   INFLUENZA VACCINE  Completed   DEXA SCAN  Completed   Hepatitis C Screening  Completed   Zoster Vaccines- Shingrix  Completed   HPV VACCINES  Aged Out    Health Maintenance  Health Maintenance Due  Topic Date Due   Pneumonia Vaccine 86+ Years old (3 of 3 - PPSV23 or PCV20) 09/22/2021   COVID-19 Vaccine (6 -  2024-25 season) 03/13/2023    Colorectal cancer screening: Type of screening: Colonoscopy. Completed 2021. Repeat every 5 years  Mammogram status: Completed  . Repeat every year  Bone Density status: Ordered  . Pt provided with contact info and advised to call to schedule appt.  Lung Cancer Screening: (Low Dose CT Chest recommended if Age 37-80 years, 20 pack-year currently smoking OR have quit w/in 15years.) does not qualify.   Lung Cancer Screening Referral:   Additional Screening:  Hepatitis C Screening: does not qualify; Completed 2021  Vision Screening: Recommended annual ophthalmology exams for early detection of glaucoma and other disorders of the eye. Is the patient up to date with their annual eye exam?  Yes  Who is the provider or what is the name of the office in which the patient attends annual eye exams? [facility] If pt is not established with a provider, would they li ke to be referred to a provider to establish care? No .   Dental Screening: Recommended annual dental exams for proper oral hygiene  Nutrition Risk Assessment:  Has the patient had any N/V/D within the last 2 months?  Yes  Does the patient have any non-healing wounds?  No  Has the patient had any unintentional weight loss or weight gain?  No   Diabetes:  Is the patient diabetic?  Yes  If diabetic, was a CBG obtained today?  No  Did the patient bring in their glucometer from home?  No  How often do you monitor your CBG's? Continuous blood glucouse.   Financial Strains and Diabetes Management:  Are you having any financial strains with the device, your supplies or your medication? No .  Does the patient want to be seen by Chronic Care Management for management of their diabetes?  No  Would the patient like to be referred to a Nutritionist or for Diabetic Management?  No   Diabetic Exams:  Diabetic Eye Exam: Completed . O. Pt has been advised about the importance in completing this exam.    Diabetic Foot Exam: . Pt has been advised about the importance in completing  this exam.   Community Resource Referral / Chronic Care Management: CRR required this visit?  No   CCM required this visit?  No     Plan:     I have personally reviewed and noted the following in the patient's chart:   Medical and social history Use of alcohol, tobacco or illicit drugs  Current medications and supplements including opioid prescriptions. Patient is not currently taking opioid prescriptions. Functional ability and status Nutritional status Physical activity Advanced directives List of other physicians Hospitalizations, surgeries, and ER visits in previous 12 months Vitals Screenings to include cognitive, depression, and falls Referrals and appointments  In addition, I have reviewed and discussed with patient certain preventive protocols, quality metrics, and best practice recommendations. A written personalized care plan for preventive services as well as general preventive health recommendations were provided to patient.     Remi Haggard, LPN   [id number]   After Visit Summary: (MyChart) Due to this being a telephonic visit, the after visit summary with patients personalized plan was offered to patient via MyChart   Nurse Notes:

## 2023-08-31 NOTE — Patient Instructions (Signed)
 Raven [name] , Thank you for taking time to come for your Medicare Wellness Visit. I appreciate your ongoing commitment to your health goals. Please review the following plan we discussed and let me know if I can assist you in the future.   Screening recommendations/referrals: Colonoscopy: up to date Mammogram: up to date Bone Density: ordered Recommended yearly ophthalmology/optometry visit for glaucoma screening and checkup Recommended yearly dental visit for hygiene and checkup  Vaccinations: Influenza vaccine: up to date Pneumococcal vaccine: Education provided Tdap vaccine: up to date Shingles vaccine: up to date      Preventive Care 65 Years and Older, Female Preventive care refers to lifestyle choices and visits with your health care provider that can promote health and wellness. What does preventive care include? A yearly physical exam. This is also called an annual well check. Dental exams once or twice a year. Routine eye exams. Ask your health care provider how often you should have your eyes checked. Personal lifestyle choices, including: Daily care of your teeth and gums. Regular physical activity. Eating a healthy diet. Avoiding tobacco and drug use. Limiting alcohol use. Practicing safe sex. Taking low-dose aspirin every day. Taking vitamin and mineral supplements as recommended by your health care provider. What happens during an annual well check? The services and screenings done by your health care provider during your annual well check will depend on your age, overall health, lifestyle risk factors, and family history of disease. Counseling  Your health care provider may ask you questions about your: Alcohol use. Tobacco use. Drug use. Emotional well-being. Home and relationship well-being. Sexual activity. Eating habits. History of falls. Memory and ability to understand (cognition). Work and work Astronomer. Reproductive health. Screening  You  may have the following tests or measurements: Height, weight, and BMI. Blood pressure. Lipid and cholesterol levels. These may be checked every 5 years, or more frequently if you are over 65 years old. Skin check. Lung cancer screening. You may have this screening every year starting at age 69 if you have a 30-pack-year history of smoking and currently smoke or have quit within the past 15 years. Fecal occult blood test (FOBT) of the stool. You may have this test every year starting at age 69. Flexible sigmoidoscopy or colonoscopy. You may have a sigmoidoscopy every 5 years or a colonoscopy every 10 years starting at age 69. Hepatitis C blood test. Hepatitis B blood test. Sexually transmitted disease (STD) testing. Diabetes screening. This is done by checking your blood sugar (glucose) after you have not eaten for a while (fasting). You may have this done every 1-3 years. Bone density scan. This is done to screen for osteoporosis. You may have this done starting at age 69. Mammogram. This may be done every 1-2 years. Talk to your health care provider about how often you should have regular mammograms. Talk with your health care provider about your test results, treatment options, and if necessary, the need for more tests. Vaccines  Your health care provider may recommend certain vaccines, such as: Influenza vaccine. This is recommended every year. Tetanus, diphtheria, and acellular pertussis (Tdap, Td) vaccine. You may need a Td booster every 10 years. Zoster vaccine. You may need this after age 62. Pneumococcal 13-valent conjugate (PCV13) vaccine. One dose is recommended after age 69. Pneumococcal polysaccharide (PPSV23) vaccine. One dose is recommended after age 69. Talk to your health care provider about which screenings and vaccines you need and how often you need them. This information is not  intended to replace advice given to you by your health care provider. Make sure you discuss any  questions you have with your health care provider. Document Released: 07/25/2015 Document Revised: 03/17/2016 Document Reviewed: 04/29/2015 Elsevier Interactive Patient Education  2017 [employer].  Fall Prevention in the Home Falls can cause injuries. They can happen to people of all ages. There are many things you can do to make your home safe and to help prevent falls. What can I do on the outside of my home? Regularly fix the edges of walkways and driveways and fix any cracks. Remove anything that might make you trip as you walk through a door, such as a raised step or threshold. Trim any bushes or trees on the path to your home. Use bright outdoor lighting. Clear any walking paths of anything that might make someone trip, such as rocks or tools. Regularly check to see if handrails are loose or broken. Make sure that both sides of any steps have handrails. Any raised decks and porches should have guardrails on the edges. Have any leaves, snow, or ice cleared regularly. Use sand or salt on walking paths during winter. Clean up any spills in your garage right away. This includes oil or grease spills. What can I do in the bathroom? Use night lights. Install grab bars by the toilet and in the tub and shower. Do not use towel bars as grab bars. Use non-skid mats or decals in the tub or shower. If you need to sit down in the shower, use a plastic, non-slip stool. Keep the floor dry. Clean up any water that spills on the floor as soon as it happens. Remove soap buildup in the tub or shower regularly. Attach bath mats securely with double-sided non-slip rug tape. Do not have throw rugs and other things on the floor that can make you trip. What can I do in the bedroom? Use night lights. Make sure that you have a light by your bed that is easy to reach. Do not use any sheets or blankets that are too big for your bed. They should not hang down onto the floor. Have a firm chair that has side  arms. You can use this for support while you get dressed. Do not have throw rugs and other things on the floor that can make you trip. What can I do in the kitchen? Clean up any spills right away. Avoid walking on wet floors. Keep items that you use a lot in easy-to-reach places. If you need to reach something above you, use a strong step stool that has a grab bar. Keep electrical cords out of the way. Do not use floor polish or wax that makes floors slippery. If you must use wax, use non-skid floor wax. Do not have throw rugs and other things on the floor that can make you trip. What can I do with my stairs? Do not leave any items on the stairs. Make sure that there are handrails on both sides of the stairs and use them. Fix handrails that are broken or loose. Make sure that handrails are as long as the stairways. Check any carpeting to make sure that it is firmly attached to the stairs. Fix any carpet that is loose or worn. Avoid having throw rugs at the top or bottom of the stairs. If you do have throw rugs, attach them to the floor with carpet tape. Make sure that you have a light switch at the top of the stairs  and the bottom of the stairs. If you do not have them, ask someone to add them for you. What else can I do to help prevent falls? Wear shoes that: Do not have high heels. Have rubber bottoms. Are comfortable and fit you well. Are closed at the toe. Do not wear sandals. If you use a stepladder: Make sure that it is fully opened. Do not climb a closed stepladder. Make sure that both sides of the stepladder are locked into place. Ask someone to hold it for you, if possible. Clearly mark and make sure that you can see: Any grab bars or handrails. First and last steps. Where the edge of each step is. Use tools that help you move around (mobility aids) if they are needed. These include: Canes. Walkers. Scooters. Crutches. Turn on the lights when you go into a dark area.  Replace any light bulbs as soon as they burn out. Set up your furniture so you have a clear path. Avoid moving your furniture around. If any of your floors are uneven, fix them. If there are any pets around you, be aware of where they are. Review your medicines with your doctor. Some medicines can make you feel dizzy. This can increase your chance of falling. Ask your doctor what other things that you can do to help prevent falls. This information is not intended to replace advice given to you by your health care provider. Make sure you discuss any questions you have with your health care provider. Document Released: 04/24/2009 Document Revised: 12/04/2015 Document Reviewed: 08/02/2014 Elsevier Interactive Patient Education  2017 [employer].

## 2023-09-02 ENCOUNTER — Ambulatory Visit (HOSPITAL_COMMUNITY)
Admission: RE | Admit: 2023-09-02 | Discharge: 2023-09-02 | Disposition: A | Discharge status: Home / Self Care | Payer: Medicare Other | Source: Ambulatory Visit | Attending: Physical Medicine and Rehabilitation | Admitting: Physical Medicine and Rehabilitation

## 2023-09-02 DIAGNOSIS — H903 Sensorineural hearing loss, bilateral: Secondary | ICD-10-CM | POA: Insufficient documentation

## 2023-09-02 MED ORDER — GADOBUTROL 1 MMOL/ML IV SOLN
7.5000 mL | Freq: Once | INTRAVENOUS | Status: AC | PRN
  Administered 2023-09-02: 7.5 mL via INTRAVENOUS

## 2023-10-07 ENCOUNTER — Other Ambulatory Visit: Payer: Self-pay | Admitting: Family Medicine

## 2023-10-07 ENCOUNTER — Other Ambulatory Visit: Payer: Self-pay | Admitting: Cardiology

## 2023-11-01 ENCOUNTER — Telehealth: Payer: Self-pay

## 2023-11-01 ENCOUNTER — Other Ambulatory Visit (HOSPITAL_COMMUNITY): Payer: Self-pay

## 2023-11-01 NOTE — Telephone Encounter (Signed)
 Pharmacy Patient Advocate Encounter   Received notification from Fax that prior authorization for FreeStyle Libre Plus 3 Sensors is required/requested.   Insurance verification completed.   The patient is insured through  Medicare Part B  .   Per test claim: Medication is not eligible for pharmacy benefits and must be billed through medical insurance. As our team only handles pharmacy related prior auths, medical PA's must be submitted by the clinic. Thank you

## 2023-11-03 ENCOUNTER — Ambulatory Visit: Payer: Self-pay

## 2023-11-03 ENCOUNTER — Encounter: Payer: Self-pay | Admitting: Urgent Care

## 2023-11-03 ENCOUNTER — Ambulatory Visit: Admitting: Urgent Care

## 2023-11-03 VITALS — BP 127/82 | HR 90 | Temp 99.1°F | Wt 171.2 lb

## 2023-11-03 DIAGNOSIS — L03113 Cellulitis of right upper limb: Secondary | ICD-10-CM

## 2023-11-03 DIAGNOSIS — S41131A Puncture wound without foreign body of right upper arm, initial encounter: Secondary | ICD-10-CM

## 2023-11-03 DIAGNOSIS — Z23 Encounter for immunization: Secondary | ICD-10-CM

## 2023-11-03 DIAGNOSIS — W5501XA Bitten by cat, initial encounter: Secondary | ICD-10-CM

## 2023-11-03 DIAGNOSIS — S41151A Open bite of right upper arm, initial encounter: Secondary | ICD-10-CM | POA: Diagnosis not present

## 2023-11-03 MED ORDER — METRONIDAZOLE 500 MG PO TABS: 500.0000 mg | ORAL_TABLET | Freq: Three times a day (TID) | ORAL | 0 refills | Status: AC

## 2023-11-03 MED ORDER — DOXYCYCLINE HYCLATE 100 MG PO TABS
100.0000 mg | ORAL_TABLET | Freq: Two times a day (BID) | ORAL | 0 refills | Status: DC
Start: 2023-11-03 — End: 2023-11-16

## 2023-11-03 NOTE — Telephone Encounter (Signed)
No further action needed. Pt scheduled.

## 2023-11-03 NOTE — Progress Notes (Unsigned)
 Established Patient Office Visit  Subjective:  Patient ID: Raven [name], female    DOB: February 28, 1956  Age: 68 y.o. MRN: [id number]  Chief Complaint  Patient presents with   Animal Bite    Pt stated that her cat bit and scratched her on her right arm on Tuesday and yesterday she started to noticing some swelling.    Animal Bite     Discussed the use of AI scribe software for clinical note transcription with the patient, who gave verbal consent to proceed.  History of Present Illness   Raven [name] is a 68 year old female who presents with a cat bite on her right forearm.  Two days ago, she was bitten on her right forearm by her cat. Initially, the bite caused swelling, which she thought might be an allergic reaction. She took Benadryl , but the swelling increased the following day. Her husband marked the area of redness with a highlighter, and by the next morning, the redness had spread beyond the marked area.  She has no fever but describes the area as 'fevered and spreaded.' There is pain in the area but no lumps or bumps in her armpit. She cleaned the wound with soap and water and peroxide after the incident. No red streaks are noted going up the arm.  She has a history of arthritis, including psoriatic, rheumatoid, and pseudogout, for which she receives infusions. The infection is exacerbating the drawing up of her hand. For pain management, she has been taking Tylenol  and ibuprofen, which provide minimal relief.  Her cat is fully vaccinated, including rabies. Her last tetanus vaccine was in 2016, which is nearing the recommended booster period.      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   S/P total knee arthroplasty, left 05/26/2021   Iatrogenic adrenal insufficiency (HCC) 04/30/2021   Right thyroid  nodule 04/30/2021   Overweight (BMI 25.0-29.9) 07/23/2019   Anxiety and depression    Gallstones 11/11/2017   UTI  (urinary tract infection) 10/31/2015   Acute bronchitis 12/18/2013   Essential tremor 10/11/2012   Rheumatoid arthritis (HCC) 07/27/2012   Intention tremor 07/27/2012   Anxiety state 04/14/2009   Type 2 diabetes mellitus with hyperglycemia, without long-term current use of insulin  (HCC) 12/03/2008   CARPAL TUNNEL SYNDROME, BILATERAL 03/06/2008   Essential hypertension 03/06/2008   GERD 03/06/2008   Hyperlipidemia 12/01/2007   Migraine headache 12/01/2007   Past Medical History:  Diagnosis Date   Acute medial meniscus tear of right knee    Adrenal insufficiency (Addison's disease) (HCC) 02/2021   Anemia of chronic disease    Mild, Hb stable consistently   Anxiety and depression    Carpal tunnel syndrome    2023, bilateral   Chest pain 10/2017   +Cardiac CT.  Myocardial perfusion imaging NORMAL, EF>65%   Collagen vascular disease (HCC)    Diabetes mellitus 1995   Managed by Dr. Aldona Amel (Endo).  +retinopathy   GERD (gastroesophageal reflux disease) per pt watches diet and take tums as needed   with hx of prox esoph stricture + dilation procedure   H/O hiatal hernia    slightly larger ("moderate" size) on CT angio done to r/o PE 10/2017. noted on egd again 03/2021   Hepatic steatosis    02/2023 ultrasound   History of adrenal insufficiency    in 2022 when she tried coming off her chronic daily prednisone  for her RA   Hyperlipidemia  Hypertension    Interstitial cystitis    Lichen sclerosus of female genitalia    Migraine    Osteoarthritis, multiple sites    Palpitations 10/2017   3 wk event monitor: symptoms correlate with sinus rhythm with PACs, at times runs of PACs up to 5 beats.   Pseudogout of knee, left    colchicine  helpful   Psoriasis    Seasonal allergies    Seropositive rheumatoid arthritis (HCC)    Hands, knees, jaw, elbow: responding to Simponi as of 09/29/15 rheum f/u.  Waning effectiveness on 03/2016 f/u so pt switched to Orencia injections.  Doing well on  chronic low-dose prednisone  therapy+ methotrexate +orencia as of 01/2019, 08/2019, 11/2019   Past Surgical History:  Procedure Laterality Date   ABDOMINAL HYSTERECTOMY  1991   for fibroids; ovaries still in   BIOPSY  03/17/2021   Procedure: BIOPSY;  Surgeon: Felecia Hopper, MD;  Location: [facility];  Service: Gastroenterology;;   BREAST BIOPSY  2001; 02/12/16   fibrocystic breast disease in 2001 and again in 02/2016 (+scar from prev bx site w/calcifications).   CARDIOVASCULAR STRESS TEST  11/01/2017   Myocard perf imaging: NORMAL (EF >60-65%).  12/30/21 normal   CARPAL TUNNEL RELEASE Right 2024   COLONOSCOPY  09/12/2013; 02/25/20   Normal 2015 and 2021: Recall 5 yrs ([facility], Dr. Elsie Halo) due to FH of colon polyps.   CYSTOSCOPY  2004   for chronic UTI   DEXA  10/2016   Bone density normal (T-score 0.0)   ESOPHAGOGASTRODUODENOSCOPY  04/19/2011   Nl except antral gastritis: bx = mild chron gastritis (H pyloria neg)    ESOPHAGOGASTRODUODENOSCOPY (EGD) WITH PROPOFOL  N/A 03/27/2020   Esoph stenosis-->dilated.  Mod size hiatal hernia (Dig Hea Spec). Procedure: ESOPHAGOGASTRODUODENOSCOPY (EGD) WITH PROPOFOL  ;  Surgeon: Celedonio Coil, MD;  Location: [facility];  Service: Endoscopy;  Laterality: N/A;   ESOPHAGOGASTRODUODENOSCOPY (EGD) WITH PROPOFOL  N/A 03/17/2021   Esoph stricture->dilated.  Large hiatal hernia. Also +esoph candidiasis, bx inflammation.  Procedure: ESOPHAGOGASTRODUODENOSCOPY (EGD) WITH PROPOFOL ;  Surgeon: Felecia Hopper, MD;  Location: [facility];  Service: Gastroenterology;  Laterality: N/A;   EVENT MONITOR  10/2017   3 wk-->Symptoms correlate with sinus rhythm with PACs, at times runs of PACs up to 5 beats.   GALLBLADDER SURGERY  2019   intraarticular steroid injection  2013   3 R knee & L X 1; Dr Assunta Lax   KNEE ARTHROCENTESIS  2013   R knee x 3 & L X 1   KNEE ARTHROSCOPY  10/21/2011   Procedure: ARTHROSCOPY KNEE;  Surgeon: Florencia Hunter, MD;  Location: [facility];  Service: Orthopedics;  Laterality: Right;  WITH MEDIAL and lateral shaving of femoral chondyl  with microfracture technique of lateral and medial femoral chondyl suprapatellar synovectomy   LAPAROSCOPIC CHOLECYSTECTOMY  01/2018   myocardial perf imaging     Normal 12/2021   SAVORY DILATION N/A 03/27/2020   Procedure: SAVORY DILATION;  Surgeon: Celedonio Coil, MD;  Location: [facility];  Service: Endoscopy;  Laterality: N/A;   TOTAL KNEE ARTHROPLASTY  04/12/2012   RIGHT  Procedure: TOTAL KNEE ARTHROPLASTY;  Surgeon: Florencia Hunter, MD;  Location: [facility];  Service: Orthopedics;  Laterality: Right;  Right Total Knee Arthroplasty   TOTAL KNEE ARTHROPLASTY Left 05/26/2021   Procedure: TOTAL KNEE ARTHROPLASTY;  Surgeon: Claiborne Crew, MD;  Location: [facility];  Service: Orthopedics;  Laterality: Left;   TRANSTHORACIC ECHOCARDIOGRAM  11/13/2021   EF 65-70%, mild dec RV  fxn, grd I DD, o/w normal   TUBAL LIGATION  1981   Social History   Tobacco Use   Smoking status: Never   Smokeless tobacco: Never  Vaping Use   Vaping status: Never Used  Substance Use Topics   Alcohol use: No   Drug use: No      ROS: as noted in HPI  Objective:     There were no vitals taken for this visit. BP Readings from Last 3 Encounters:  08/24/23 126/70  07/11/23 138/80  05/27/23 126/85   Wt Readings from Last 3 Encounters:  08/24/23 171 lb 3.2 oz (77.7 kg)  07/11/23 171 lb (77.6 kg)  05/27/23 175 lb 3.2 oz (79.5 kg)      Physical Exam Vitals and nursing note reviewed.  Constitutional:      General: She is not in acute distress.    Appearance: Normal appearance. She is not ill-appearing, toxic-appearing or diaphoretic.  HENT:     Head: Normocephalic and atraumatic.  Cardiovascular:     Rate and Rhythm: Normal rate.     Pulses: Normal pulses.  Pulmonary:     Effort: Pulmonary effort is normal. No respiratory distress.  Musculoskeletal:        General: Tenderness and signs of  injury present. No swelling. Normal range of motion.       Arms:     Comments: No axillary lymphadenopathy  Lymphadenopathy:     Cervical: No cervical adenopathy.  Skin:    General: Skin is warm and dry.     Capillary Refill: Capillary refill takes less than 2 seconds.     Coloration: Skin is not jaundiced.     Findings: Bruising (around puncture wounds) and erythema present. No rash.  Neurological:     General: No focal deficit present.     Mental Status: She is alert and oriented to person, place, and time.     Motor: No weakness.      No results found for any visits on 11/03/23.  Last CBC Lab Results  Component Value Date   WBC 7.4 02/17/2022   HGB 11.3 (L) 02/17/2022   HCT 34.7 (L) 02/17/2022   MCV 76.5 (L) 02/17/2022   MCH 27.5 05/27/2021   RDW 16.8 (H) 02/17/2022   PLT 327.0 02/17/2022   Last metabolic panel Lab Results  Component Value Date   GLUCOSE 290 (H) 04/22/2023   NA 137 04/22/2023   K 4.2 04/22/2023   CL 105 04/22/2023   CO2 23 04/22/2023   BUN 22 04/22/2023   CREATININE 0.86 04/22/2023   GFR 69.71 04/22/2023   CALCIUM  9.1 04/22/2023   CALCIUM  9.1 04/22/2023   PHOS 4.8 (H) 04/22/2023   PROT 6.5 02/17/2022   ALBUMIN 4.2 02/22/2023   BILITOT 0.4 02/17/2022   ALKPHOS 57 02/22/2023   AST 57 (A) 02/22/2023   ALT 76 (A) 02/22/2023   ANIONGAP 7 05/27/2021      The 10-year ASCVD risk score (Arnett DK, et al., 2019) is: 26.9%  Assessment & Plan:  Cellulitis of right upper extremity -     Doxycycline  Hyclate; Take 1 tablet (100 mg total) by mouth 2 (two) times daily.  Dispense: 14 tablet; Refill: 0 -     metroNIDAZOLE ; Take 1 tablet (500 mg total) by mouth 3 (three) times daily for 7 days.  Dispense: 21 tablet; Refill: 0  Cat bite of right upper arm, initial encounter -     Doxycycline  Hyclate; Take 1 tablet (100 mg total) by  mouth 2 (two) times daily.  Dispense: 14 tablet; Refill: 0 -     metroNIDAZOLE ; Take 1 tablet (500 mg total) by mouth 3  (three) times daily for 7 days.  Dispense: 21 tablet; Refill: 0  Puncture wound of right upper arm, initial encounter -     Doxycycline  Hyclate; Take 1 tablet (100 mg total) by mouth 2 (two) times daily.  Dispense: 14 tablet; Refill: 0 -     metroNIDAZOLE ; Take 1 tablet (500 mg total) by mouth 3 (three) times daily for 7 days.  Dispense: 21 tablet; Refill: 0  Need for diphtheria-tetanus-pertussis (Tdap) vaccine -     Tdap vaccine greater than or equal to 7yo IM  Assessment and Plan    Infected cat bite on right forearm Infected cat bite with swelling and erythema, no fever. Concern for Pasteurella infection. Allergic to penicillin. Tetanus booster recommended due to infection risk. - Prescribe dual antibiotic therapy due to penicillin allergy. - Administer tetanus vaccine. - Instruct to monitor for signs of lymphangitis. - Advise to report any signs of lymphangitis or fever immediately.  Tetanus vaccination needed - update Tdap today        No follow-ups on file.   Mandy Second, PA

## 2023-11-03 NOTE — Telephone Encounter (Signed)
 Chief Complaint: cat bite Symptoms: red, swollen arm Frequency: x 2 days Pertinent Negatives: Patient denies  Disposition: [] ED /[] Urgent Care (no appt availability in office) / [x] Appointment(In office/virtual)/ []  [place]/ [] Home Care/ [] Refused Recommended Disposition /[] [place] [facility]/ []  Follow-up with PCP Additional Notes: pt states that she got bitten by a cat and possibly has cellulitis in her right arm. States it happened about 2 days ago states arm is swollen up to her elbow and the bit was about 3 inches below wrist and redden and warm .  Copied from CRM [id number]. Topic: Clinical - Red Word Triage >> Nov 03, 2023  8:18 AM Deaijah H wrote: Red Word that prompted transfer to Nurse Triage: Bit by cat. Reason for Disposition  Looks infected (spreading redness, red streak, pus)  Answer Assessment - Initial Assessment Questions 1. ANIMAL: "What type of animal caused the bite?" "Is the injury from a bite or a claw?" If the animal is a dog or a cat, ask: "Was it a pet or a stray?" "Was it acting ill or behaving strangely?"     Her cat 2. LOCATION: "Where is the bite located?"      Right lower arm 3. SIZE: "How big is the bite?" "What does it look like?"      unknown 4. ONSET: "When did the bite happen?" (Minutes or hours ago)      X 2 days ago 5. CIRCUMSTANCES: "Tell me how this happened."      Cat attacked and bit and scratched her  6. TETANUS: "When was your last tetanus booster?"     unknown 7. RABIES VACCINE: For dog or cat bites, ask: "Do you know if the pet is vaccinated against rabies?"  (e.g., yes, no, overdue for rabies shot, unknown)     recent  Protocols used: Animal Bite-A-AH

## 2023-11-03 NOTE — Patient Instructions (Signed)
 Please start taking TWO antibiotics:  - doxycycline  twice daily x 7 days - metronidazole  three times daily x 7 days.  Monitor for resolution of the redness of your arm. If you develop a fever, a red streak going up your arm, or swollen lymph nodes, please head to the ER.  Please schedule a follow up in 5-7 days.  Your tetanus vaccine was updated today.

## 2023-11-08 NOTE — Telephone Encounter (Signed)
Order submitted through [employer]

## 2023-11-16 ENCOUNTER — Encounter: Payer: Self-pay | Admitting: Cardiology

## 2023-11-16 ENCOUNTER — Ambulatory Visit: Attending: Cardiology | Admitting: Cardiology

## 2023-11-16 VITALS — BP 118/76 | HR 82 | Ht 68.0 in | Wt 175.8 lb

## 2023-11-16 DIAGNOSIS — E785 Hyperlipidemia, unspecified: Secondary | ICD-10-CM | POA: Diagnosis not present

## 2023-11-16 DIAGNOSIS — I471 Supraventricular tachycardia, unspecified: Secondary | ICD-10-CM | POA: Diagnosis not present

## 2023-11-16 MED ORDER — METOPROLOL TARTRATE 25 MG PO TABS: 12.5000 mg | ORAL_TABLET | Freq: Two times a day (BID) | ORAL | 3 refills | Status: DC

## 2023-11-16 NOTE — Patient Instructions (Signed)
 Medication Instructions:  Your physician recommends that you continue on your current medications as directed. Please refer to the Current Medication list given to you today.   Labwork: Fasting lipid panel at [facility]  Testing/Procedures: None  Follow-Up: Your physician recommends that you schedule a follow-up appointment in: 1 year. You will receive a reminder call in about 8 months reminding you to schedule your appointment. If you don't receive this call, please contact our office.   Any Other Special Instructions Will Be Listed Below (If Applicable). Thank you for choosing [facility]!     If you need a refill on your cardiac medications before your next appointment, please call your pharmacy.

## 2023-11-16 NOTE — Progress Notes (Signed)
 Clinical Summary Raven [name] is a 68 y.o.female seen today for follow up of the following medical problems.     1. Syncope  - prior episode of syncope at time of initial consultation in 2019  - event monitor showed runs of SVT up to 200bpm   -another episode of syncope Feb 2023, was in church. Started getting dizzy while sitting. 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 Ongoing dizziness throughout the sermon. Sat down on stool, transient of consciousness. Came to, brought home and sat in her chair. Started having diarrhea, vomiting, diaphoretic with cold sweats. Left arm pain into left neck and chest tightness, some SOB. Reports history of migraines. The next day dizziness had resolved, still with some neck and shoulder pains, chest tightness that lasted several hours - no recurrent syncope - ongoing chest tightness, SOB. Left sided, 4/10 in severity. Can occur at rest or with activity. +SOB. Lasts <1 hr. Worst with deep breath.  - has been limiting activities due SOB/DOE - no prior history of blood.   - 11/2021 echo: LVEF 65-70%, no WMAs, grade I DD, mild RV dysfunction,   11/2021 CT PE: no PE 12/2021 nuclear stress: no ischemia  - no recurrent episodes - no recent chest pains.     2. HLD - 06/2023 TC 273 HDL 39 TG 225 LDL 192 - recent elevated LFTs related to RA medication, she was to increase her crestor  as recommended by her endocrinologist but held on current dose due to LFT issue. LFTs have since improved.     3. PSVT  event monitor showed runs of SVT up to 200bpm  - rare palpitations with high caffeine only - compliant with lopressor    3. DM2  Followed by endocrine  4. Adrenal insufficiency - followed by endo   5. RA - followed by rheumatology    SH: works as Education officer, environmental Past Medical History:  Diagnosis Date   Acute medial meniscus tear of right knee    Adrenal insufficiency (Addison's disease) (HCC) 02/2021   Anemia of chronic disease    Mild, Hb stable consistently    Anxiety and depression    Carpal tunnel syndrome    2023, bilateral   Chest pain 10/2017   +Cardiac CT.  Myocardial perfusion imaging NORMAL, EF>65%   Collagen vascular disease (HCC)    Diabetes mellitus 1995   Managed by Dr. Aldona Amel (Endo).  +retinopathy   GERD (gastroesophageal reflux disease) per pt watches diet and take tums as needed   with hx of prox esoph stricture + dilation procedure   H/O hiatal hernia    slightly larger ("moderate" size) on CT angio done to r/o PE 10/2017. noted on egd again 03/2021   Hepatic steatosis    02/2023 ultrasound   History of adrenal insufficiency    in 2022 when she tried coming off her chronic daily prednisone  for her RA   Hyperlipidemia    Hypertension    Interstitial cystitis    Lichen sclerosus of female genitalia    Migraine    Osteoarthritis, multiple sites    Palpitations 10/2017   3 wk event monitor: symptoms correlate with sinus rhythm with PACs, at times runs of PACs up to 5 beats.   Pseudogout of knee, left    colchicine  helpful   Psoriasis    Seasonal allergies    Seropositive rheumatoid arthritis (HCC)    Hands, knees, jaw, elbow: responding to Simponi as of 09/29/15 rheum f/u.  Waning effectiveness on 03/2016  f/u so pt switched to Orencia injections.  Doing well on chronic low-dose prednisone  therapy+ methotrexate +orencia as of 01/2019, 08/2019, 11/2019     Allergies  Allergen Reactions   Steri-Strip Compound Benzoin [Benzoin] Anaphylaxis and Itching    Blisters and itching   Penicillins Rash    Tolerated Cephalosporin 05/26/21.     Pravastatin     Myalgias   Simvastatin      Myalgias   Hydrocodone -Acetaminophen  Nausea And Vomiting   Hydroxychloroquine Sulfate Rash     Current Outpatient Medications  Medication Sig Dispense Refill   acetaminophen  (TYLENOL ) 500 MG tablet Take 1,000 mg by mouth every 6 (six) hours as needed for moderate pain.     albuterol  (VENTOLIN  HFA) 108 (90 Base) MCG/ACT inhaler Inhale 2 puffs  into the lungs every 6 (six) hours as needed for wheezing or shortness of breath. 8 g 0   amphetamine -dextroamphetamine  (ADDERALL XR) 15 MG 24 hr capsule Take 1 capsule by mouth every morning. 30 capsule 0   aspirin  EC 81 MG tablet Take 81 mg by mouth daily. Swallow whole.     BORON PO Take by mouth daily.     cetirizine (ZYRTEC) 10 MG tablet Take 10 mg by mouth daily.     clotrimazole -betamethasone  (LOTRISONE ) cream Apply 1 Application topically daily. 30 g 0   Colchicine  0.6 MG CAPS Take 2 capsules by mouth daily.     Continuous Glucose Sensor (FREESTYLE LIBRE 3 PLUS SENSOR) MISC 1 each by Does not apply route every 14 (fourteen) days. 6 each 3   CYANOCOBALAMIN PO Take 1 tablet by mouth daily.     diazepam  (VALIUM ) 5 MG tablet TAKE ONE TABLET BY MOUTH EVERY TWELVE HOURS AS NEEDED FOR MIGRAINE HEADACHES 60 tablet 5   doxycycline  (VIBRA -TABS) 100 MG tablet Take 1 tablet (100 mg total) by mouth 2 (two) times daily. 14 tablet 0   Ferrous Sulfate  (IRON PO) Take 1 tablet by mouth daily.     glipiZIDE  (GLUCOTROL ) 5 MG tablet Take 0.5-1 tablets (2.5-5 mg total) by mouth 2 (two) times daily before a meal. 180 tablet 3   Insulin  Pen Needle 32G X 4 MM MISC Use 1x a day 100 each 3   leflunomide (ARAVA) 20 MG tablet Take 10 mg by mouth daily.     lisinopril  (ZESTRIL ) 40 MG tablet Take 1 tablet (40 mg total) by mouth daily. 90 tablet 3   Magnesium  300 MG CAPS Take 300 mg by mouth daily.     metFORMIN  (GLUCOPHAGE -XR) 500 MG 24 hr tablet Take 2 tablets (1,000 mg total) by mouth 2 (two) times daily with a meal. 360 tablet 3   metoprolol  tartrate (LOPRESSOR ) 25 MG tablet TAKE 1/2 TABLET (12.5mg  total) BY MOUTH TWICE DAILY 15 tablet 0   omeprazole  (PRILOSEC ) 40 MG capsule Take 1 capsule (40 mg total) by mouth 2 (two) times daily. 60 capsule 0   phenazopyridine  (PYRIDIUM ) 95 MG tablet Take 190 mg by mouth 2 (two) times daily as needed for pain.     Polyvinyl Alcohol-Povidone (REFRESH OP) Place 1 drop into both  eyes 2 (two) times daily.     predniSONE  (DELTASONE ) 5 MG tablet Take 1-2 tablets (5-10 mg total) by mouth daily. 180 tablet 3   primidone  (MYSOLINE ) 50 MG tablet Take 50-100 mg by mouth See admin instructions. Take 50 mg by mouth in the morning and 100 mg by mouth in the evening.     promethazine  (PHENERGAN ) 12.5 MG tablet TAKE 1-2 TABLETS BY MOUTH  EVERY SIX hours AS NEEDED FOR nausea 30 tablet 2   rosuvastatin  (CRESTOR ) 20 MG tablet Take 1 tablet (20 mg total) by mouth daily. 90 tablet 3   sertraline  (ZOLOFT ) 100 MG tablet Take 1 tablet (100 mg total) by mouth daily. 90 tablet 3   No current facility-administered medications for this visit.     Past Surgical History:  Procedure Laterality Date   ABDOMINAL HYSTERECTOMY  1991   for fibroids; ovaries still in   BIOPSY  03/17/2021   Procedure: BIOPSY;  Surgeon: Felecia Hopper, MD;  Location: [facility];  Service: Gastroenterology;;   BREAST BIOPSY  2001; 02/12/16   fibrocystic breast disease in 2001 and again in 02/2016 (+scar from prev bx site w/calcifications).   CARDIOVASCULAR STRESS TEST  11/01/2017   Myocard perf imaging: NORMAL (EF >60-65%).  12/30/21 normal   CARPAL TUNNEL RELEASE Right 2024   COLONOSCOPY  09/12/2013; 02/25/20   Normal 2015 and 2021: Recall 5 yrs ([facility], Dr. Elsie Halo) due to FH of colon polyps.   CYSTOSCOPY  2004   for chronic UTI   DEXA  10/2016   Bone density normal (T-score 0.0)   ESOPHAGOGASTRODUODENOSCOPY  04/19/2011   Nl except antral gastritis: bx = mild chron gastritis (H pyloria neg)    ESOPHAGOGASTRODUODENOSCOPY (EGD) WITH PROPOFOL  N/A 03/27/2020   Esoph stenosis-->dilated.  Mod size hiatal hernia (Dig Hea Spec). Procedure: ESOPHAGOGASTRODUODENOSCOPY (EGD) WITH PROPOFOL  ;  Surgeon: Celedonio Coil, MD;  Location: [facility];  Service: Endoscopy;  Laterality: N/A;   ESOPHAGOGASTRODUODENOSCOPY (EGD) WITH PROPOFOL  N/A 03/17/2021   Esoph stricture->dilated.  Large hiatal hernia. Also +esoph candidiasis,  bx inflammation.  Procedure: ESOPHAGOGASTRODUODENOSCOPY (EGD) WITH PROPOFOL ;  Surgeon: Felecia Hopper, MD;  Location: [facility];  Service: Gastroenterology;  Laterality: N/A;   EVENT MONITOR  10/2017   3 wk-->Symptoms correlate with sinus rhythm with PACs, at times runs of PACs up to 5 beats.   GALLBLADDER SURGERY  2019   intraarticular steroid injection  2013   3 R knee & L X 1; Dr Assunta Lax   KNEE ARTHROCENTESIS  2013   R knee x 3 & L X 1   KNEE ARTHROSCOPY  10/21/2011   Procedure: ARTHROSCOPY KNEE;  Surgeon: Florencia Hunter, MD;  Location: [facility];  Service: Orthopedics;  Laterality: Right;  WITH MEDIAL and lateral shaving of femoral chondyl  with microfracture technique of lateral and medial femoral chondyl suprapatellar synovectomy   LAPAROSCOPIC CHOLECYSTECTOMY  01/2018   myocardial perf imaging     Normal 12/2021   SAVORY DILATION N/A 03/27/2020   Procedure: SAVORY DILATION;  Surgeon: Celedonio Coil, MD;  Location: [facility];  Service: Endoscopy;  Laterality: N/A;   TOTAL KNEE ARTHROPLASTY  04/12/2012   RIGHT  Procedure: TOTAL KNEE ARTHROPLASTY;  Surgeon: Florencia Hunter, MD;  Location: [facility];  Service: Orthopedics;  Laterality: Right;  Right Total Knee Arthroplasty   TOTAL KNEE ARTHROPLASTY Left 05/26/2021   Procedure: TOTAL KNEE ARTHROPLASTY;  Surgeon: Claiborne Crew, MD;  Location: [facility];  Service: Orthopedics;  Laterality: Left;   TRANSTHORACIC ECHOCARDIOGRAM  11/13/2021   EF 65-70%, mild dec RV fxn, grd I DD, o/w normal   TUBAL LIGATION  1981     Allergies  Allergen Reactions   Steri-Strip Compound Benzoin [Benzoin] Anaphylaxis and Itching    Blisters and itching   Penicillins Rash    Tolerated Cephalosporin 05/26/21.     Pravastatin     Myalgias   Simvastatin   Myalgias   Hydrocodone -Acetaminophen  Nausea And Vomiting   Hydroxychloroquine Sulfate Rash      Family History  Problem Relation Age of Onset   Diabetes Mother    Stroke  Mother 27   Osteoarthritis Mother    Heart disease Father        CABG   Lung cancer Father    Prostate cancer Father    Pancreatic cancer Father    Bipolar disorder Daughter    Arthritis Maternal Grandmother        rheumatoid   Pancreatic cancer Paternal Grandfather    Stroke Brother 48   Heart attack Brother 62       smoker   Anxiety disorder Son    Breast cancer Neg Hx      Social History Ms. Lopezmartinez reports that she has never smoked. She has never used smokeless tobacco. Ms. Wittstruck reports no history of alcohol use.    Physical Examination Today's Vitals   11/16/23 0828  BP: 118/76  Pulse: 82  SpO2: 98%  Weight: 175 lb 12.8 oz (79.7 kg)  Height: 5\' 8"  (1.727 m)   Body mass index is 26.73 kg/m.  Gen: resting comfortably, no acute distress HEENT: no scleral icterus, pupils equal round and reactive, no palptable cervical adenopathy,  CV: RRR, no m/r,g no jvd Resp: Clear to auscultation bilaterally GI: abdomen is soft, non-tender, non-distended, normal bowel sounds, no hepatosplenomegaly MSK: extremities are warm, no edema.  Skin: warm, no rash Neuro:  no focal deficits Psych: appropriate affect   Diagnostic Studies   10/2017 nuclear stress There was no ST segment deviation noted during stress. The study is normal. There are no perfusion defects This is a low risk study. The left ventricular ejection fraction is hyperdynamic (>65%).     11/2017 Monitor 21 day event monitor Min HR 53, Max HR 146, Avg HR 76 Symptoms correlate with sinus rhythm with PACs, at times runs of PACs up to 5 beats  Assessment and Plan   1. Syncope - benign prior workups, no recurrent episodes in 2 years now - continue to monitor  2. HLD - repeat lipid panel. Likely will need to increase crestor . If increase would recheck LFTs in 6 weeks given recent elevation related to RA meds  3. PSVT - no symptoms, continue metoprolol  - EKG today shows NSR    Laurann Pollock, M.D.

## 2023-11-16 NOTE — Addendum Note (Signed)
 Addended by: Camilo Cella on: 11/16/2023 08:54 AM   Modules accepted: Orders

## 2023-11-17 ENCOUNTER — Ambulatory Visit (INDEPENDENT_AMBULATORY_CARE_PROVIDER_SITE_OTHER): Payer: Medicare Other | Admitting: Internal Medicine

## 2023-11-17 ENCOUNTER — Encounter: Payer: Self-pay | Admitting: Internal Medicine

## 2023-11-17 VITALS — BP 122/70 | HR 81 | Ht 68.0 in | Wt 173.8 lb

## 2023-11-17 DIAGNOSIS — E1165 Type 2 diabetes mellitus with hyperglycemia: Secondary | ICD-10-CM

## 2023-11-17 DIAGNOSIS — E041 Nontoxic single thyroid nodule: Secondary | ICD-10-CM | POA: Diagnosis not present

## 2023-11-17 DIAGNOSIS — Z7984 Long term (current) use of oral hypoglycemic drugs: Secondary | ICD-10-CM

## 2023-11-17 DIAGNOSIS — E785 Hyperlipidemia, unspecified: Secondary | ICD-10-CM

## 2023-11-17 DIAGNOSIS — E2749 Other adrenocortical insufficiency: Secondary | ICD-10-CM | POA: Diagnosis not present

## 2023-11-17 LAB — POCT GLYCOSYLATED HEMOGLOBIN (HGB A1C): Hemoglobin A1C: 6.2 % — AB (ref 4.0–5.6)

## 2023-11-17 MED ORDER — GLIPIZIDE 5 MG PO TABS: 2.5000 mg | ORAL_TABLET | Freq: Every day | ORAL | Status: DC

## 2023-11-17 NOTE — Patient Instructions (Addendum)
 Please continue: - Metformin  ER 1000 mg 2x daily  Stop Glipizide  before dinner: - Glipizide  2.5 mg before a larger meal  Continue prednisone  5 mg day.  Regarding Prednisone : - You absolutely need to take this medication every day and not skip doses. - Please double the dose if you have a fever, for the duration of the fever. - If you cannot take anything by mouth (vomiting) or you have severe diarrhea so that you eliminate the prednisone  pills in your stool, please make sure that you get steroids in the vein instead - go to the nearest emergency department/urgent care or you may go to your PCPs office   Please come back for a follow-up appointment in 4 months.
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
## 2023-11-18 ENCOUNTER — Encounter: Payer: Self-pay | Admitting: Internal Medicine

## 2023-11-18 LAB — LIPID PANEL
Chol/HDL Ratio: 3.9 ratio (ref 0.0–4.4)
Cholesterol, Total: 157 mg/dL (ref 100–199)
HDL: 40 mg/dL (ref >39)
LDL Chol Calc (NIH): 86 mg/dL (ref 0–99)
Triglycerides: 184 mg/dL — ABNORMAL HIGH (ref 0–149)
VLDL Cholesterol Cal: 31 mg/dL (ref 5–40)

## 2023-11-18 LAB — MICROALBUMIN / CREATININE URINE RATIO
Creatinine, Urine: 82 mg/dL (ref 20–275)
Microalb Creat Ratio: 10 mg/g{creat} (ref <30)
Microalb, Ur: 0.8 mg/dL

## 2023-11-22 ENCOUNTER — Ambulatory Visit: Payer: Self-pay

## 2023-12-27 ENCOUNTER — Other Ambulatory Visit: Payer: Self-pay | Admitting: Pharmacist

## 2023-12-27 ENCOUNTER — Encounter: Payer: Self-pay | Admitting: Pharmacist

## 2023-12-27 DIAGNOSIS — E1165 Type 2 diabetes mellitus with hyperglycemia: Secondary | ICD-10-CM

## 2023-12-27 NOTE — Progress Notes (Signed)
 Pharmacy Quality Measure Review  This patient is appearing on a report for being at risk of failing the adherence measure for cholesterol (statin), diabetes, and hypertension (ACEi/ARB) medications this calendar year.  She is noted to have rosuvastatin  on her medications list but has not filled rosuvastatin  in 2025 yet which also puts her at risk of failing the SUPD (statin use in diabetic patients measure)   Medication: rosuvastatin   Last fill date: 07/11/2023 for 90 day supply - per her pharmacy she does have 2 refills remaining that she can request.   Lab Results  Component Value Date   CHOL 157 11/16/2023   CHOL 273 (H) 07/11/2023   CHOL 146 06/14/2022   Lab Results  Component Value Date   HDL 40 11/16/2023   HDL 39 (L) 07/11/2023   HDL 51.80 06/14/2022   Lab Results  Component Value Date   LDLCALC 86 11/16/2023   LDLCALC 192 (H) 07/11/2023   LDLCALC 66 06/14/2022   Lab Results  Component Value Date   TRIG 184 (H) 11/16/2023   TRIG 225 (H) 07/11/2023   TRIG 144.0 06/14/2022   Lab Results  Component Value Date   CHOLHDL 3.9 11/16/2023   CHOLHDL 7.0 (H) 07/11/2023   CHOLHDL 3 06/14/2022   Lab Results  Component Value Date   LDLDIRECT 47.0 03/31/2021   LDLDIRECT 142.3 03/06/2008   LDLDIRECT 151.3 11/27/2007     Medication: glipizide  5mg  Last fill date: 10/07/2023 for #180 tablets which with newest directions from Dr Lavina Pou last visit on 11/17/2023 - take 0.5 tablet once daily with largest meal, #180 would be 180 day or 6 months supply. Per her pharmacy the only Rx they have on file still has directions of take 0.5 to 1 tablet twice a day.    Medication: metformin  ER 500mg  - take 2 tablets twice a day Last fill date: 10/07/2023 for 90 day supply Confirmed correct directions at [facility] pharmacy and patient has 2 refills remaining   Lab Results  Component Value Date   HGBA1C 6.2 (A) 11/17/2023     Medication: lisinopril  40mg  - unable to determine if  patient is taking 20mg  / day or 40mg / day Last fill date: 12/06/2023 for #90 = 90 day supply per pharmacy records. Previously she filled #90 tablets on 05/26/2024  BP Readings from Last 3 Encounters:  11/17/23 122/70  11/16/23 118/76  11/03/23 127/82     Plan:  Left voicemail for patient to return my call at their convenience. Need to confirm how she is taking Epic current medications to update Epic records and prescriptions at her pharmacy. Contacted pharmacy to verify directions and remaining refills for prescriptions.   Will collaborate with provider to facilitate refill needs and updated prescription with current directions.   Cecilie Coffee, PharmD Clinical Pharmacist [facility]  Population Health [phone]

## 2023-12-30 MED ORDER — GLIPIZIDE 5 MG PO TABS: ORAL_TABLET | ORAL | 3 refills | Status: AC

## 2024-01-19 ENCOUNTER — Ambulatory Visit: Admitting: Cardiology

## 2024-03-20 ENCOUNTER — Ambulatory Visit: Admitting: Internal Medicine

## 2024-03-22 ENCOUNTER — Ambulatory Visit: Payer: Self-pay

## 2024-03-22 NOTE — Telephone Encounter (Signed)
 No further action needed.

## 2024-03-22 NOTE — Telephone Encounter (Signed)
 FYI Only or Action Required?: Action required by provider: request for appointment.  Patient was last seen in primary care on 11/03/2023 by Lowella Benton CROME, PA.  Called Nurse Triage reporting Cough.  Symptoms began several days ago.  Interventions attempted: Rest, hydration, or home remedies.  Symptoms are: unchanged.  Triage Disposition: See Physician Within 24 Hours  Patient/caregiver understands and will follow disposition?: YesCopied from CRM #[id number]. Topic: Clinical - Red Word Triage >> Mar 22, 2024  2:15 PM Paige D wrote: Red Word that prompted transfer to Nurse Triage: sore throat, cough, congestion, possible ear infection, earache Reason for Disposition  SEVERE coughing spells (e.g., whooping sound after coughing, vomiting after coughing)  Answer Assessment - Initial Assessment Questions No office appts. Pt is scheduled for NP [facility] at [facility] tomorrow morning.      1. ONSET: When did the cough begin?      Sunday  2. SEVERITY: How bad is the cough today?      Not sleeping much 3. SPUTUM: Describe the color of your sputum (e.g., none, dry cough; clear, white, yellow, green)     Green  4. HEMOPTYSIS: Are you coughing up any blood? If Yes, ask: How much? (e.g., flecks, streaks, tablespoons, etc.)     na 5. DIFFICULTY BREATHING: Are you having difficulty breathing? If Yes, ask: How bad is it? (e.g., mild, moderate, severe)      denies 6. FEVER: Do you have a fever? If Yes, ask: What is your temperature, how was it measured, and when did it start?     denies 7. CARDIAC HISTORY: Do you have any history of heart disease? (e.g., heart attack, congestive heart failure)      denies 8. LUNG HISTORY: Do you have any history of lung disease?  (e.g., pulmonary embolus, asthma, emphysema)     denies 9. PE RISK FACTORS: Do you have a history of blood clots? (or: recent major surgery, recent prolonged travel, bedridden)     na 10. OTHER SYMPTOMS: Do  you have any other symptoms? (e.g., runny nose, wheezing, chest pain)       Left earache, sore throat, sinus congestion  Protocols used: Cough - Acute Productive-A-AH

## 2024-03-23 ENCOUNTER — Ambulatory Visit (INDEPENDENT_AMBULATORY_CARE_PROVIDER_SITE_OTHER): Admitting: Nurse Practitioner

## 2024-03-23 ENCOUNTER — Ambulatory Visit: Payer: Self-pay | Admitting: Nurse Practitioner

## 2024-03-23 VITALS — BP 120/78 | HR 70 | Temp 97.6°F | Ht 68.0 in | Wt 175.5 lb

## 2024-03-23 DIAGNOSIS — R059 Cough, unspecified: Secondary | ICD-10-CM

## 2024-03-23 DIAGNOSIS — H60502 Unspecified acute noninfective otitis externa, left ear: Secondary | ICD-10-CM | POA: Diagnosis not present

## 2024-03-23 LAB — BASIC METABOLIC PANEL WITH GFR
BUN: 11 mg/dL (ref 6–23)
CO2: 24 meq/L (ref 19–32)
Calcium: 8.6 mg/dL (ref 8.4–10.5)
Chloride: 105 meq/L (ref 96–112)
Creatinine, Ser: 0.99 mg/dL (ref 0.40–1.20)
GFR: 58.5 mL/min — ABNORMAL LOW (ref >60.00)
Glucose, Bld: 117 mg/dL — ABNORMAL HIGH (ref 70–99)
Potassium: 4.5 meq/L (ref 3.5–5.1)
Sodium: 140 meq/L (ref 135–145)

## 2024-03-23 LAB — POCT INFLUENZA A/B
Influenza A, POC: NEGATIVE
Influenza B, POC: NEGATIVE

## 2024-03-23 LAB — POC COVID19 BINAXNOW: SARS Coronavirus 2 Ag: NEGATIVE

## 2024-03-23 LAB — POCT RAPID STREP A (OFFICE): Rapid Strep A Screen: NEGATIVE

## 2024-03-23 LAB — POCT RESPIRATORY SYNCYTIAL VIRUS: RSV Rapid Ag: NEGATIVE

## 2024-03-23 MED ORDER — DOXYCYCLINE HYCLATE 100 MG PO TABS: 100.0000 mg | ORAL_TABLET | Freq: Two times a day (BID) | ORAL | 0 refills | Status: DC

## 2024-03-23 MED ORDER — HYDROCODONE BIT-HOMATROP MBR 5-1.5 MG/5ML PO SOLN: 5.0000 mL | Freq: Four times a day (QID) | ORAL | 0 refills | Status: DC | PRN

## 2024-03-23 MED ORDER — CIPROFLOXACIN-DEXAMETHASONE 0.3-0.1 % OT SUSP: OTIC | 0 refills | Status: DC

## 2024-03-23 NOTE — Progress Notes (Signed)
 Established Patient Office Visit  Subjective   Patient ID: Raven [name], female    DOB: 07/06/1956  Age: 68 y.o. MRN: [id number]  Chief Complaint  Patient presents with   Nasal Congestion    Nasal congestion, cough and ear ache since last Sunday, pt has tried OTC meds to help with symptoms, which has not helped much. Pt is also experiencing some dizziness     Discussed the use of AI scribe software for clinical note transcription with the patient, who gave verbal consent to proceed.  History of Present Illness Raven [name] is a 68 year old female with diabetes who presents with cough, sore throat, and earache.  Upper respiratory symptoms - Cough and sore throat began last Sunday (5 days ago) - Head congestion developed after initial symptoms - Cough disrupts sleep at night. Taking dextromethorphan with some improvement in symptoms.  - No recorded fever - Light sweats present  Otalgia and auditory changes - Severe left earache began two days ago - Associated hearing difficulties in the left ear       ROS: see HPI    Objective:     BP 120/78   Pulse 70   Temp 97.6 F (36.4 C) (Temporal)   Ht 5' 8 (1.727 m)   Wt 175 lb 8 oz (79.6 kg)   SpO2 98%   BMI 26.68 kg/m    Physical Exam Vitals reviewed.  Constitutional:      General: She is not in acute distress.    Appearance: Normal appearance.  HENT:     Head: Normocephalic and atraumatic.     Comments: Maxillary sinus tenderness to palpation    Right Ear: Hearing, tympanic membrane and ear canal normal.     Left Ear: Tympanic membrane and external ear normal. Decreased hearing noted.     Ears:     Comments: Erythematous canal to left side Neck:     Vascular: No carotid bruit.  Cardiovascular:     Rate and Rhythm: Normal rate and regular rhythm.     Pulses: Normal pulses.     Heart sounds: Normal heart sounds.  Pulmonary:     Effort: Pulmonary effort is normal.     Breath sounds: Normal  breath sounds.  Skin:    General: Skin is warm and dry.  Neurological:     General: No focal deficit present.     Mental Status: She is alert and oriented to person, place, and time.  Psychiatric:        Mood and Affect: Mood normal.        Behavior: Behavior normal.        Judgment: Judgment normal.      Results for orders placed or performed in visit on 03/23/24  POCT respiratory syncytial virus  Result Value Ref Range   RSV Rapid Ag negative   POC COVID-19  Result Value Ref Range   SARS Coronavirus 2 Ag Negative Negative  POCT Influenza A/B  Result Value Ref Range   Influenza A, POC Negative Negative   Influenza B, POC Negative Negative  POCT rapid strep A  Result Value Ref Range   Rapid Strep A Screen Negative Negative      The 10-year ASCVD risk score (Arnett DK, et al., 2019) is: 16.7%    Assessment & Plan:   Problem List Items Addressed This Visit       Nervous and Auditory   Acute otitis externa of left ear - Primary  Acute Treat with Ciprodex  drops, 4 drops twice a day x 7 days Return to clinic if symptoms do not improve      Relevant Medications   ciprofloxacin -dexamethasone  (CIPRODEX ) OTIC suspension   doxycycline  (VIBRA -TABS) 100 MG tablet     Other   Cough   Acute Likely viral versus bacterial at this point COVID, flu, strep, RSV testing today negative Continue supportive management at home, monitor for symptom improvement or worsening.  If symptoms worsening over the next 48 to 72 hours treat with course of doxycycline . Both patient and husband were educated on the above plan and had no further questions      Relevant Medications   doxycycline  (VIBRA -TABS) 100 MG tablet   HYDROcodone  bit-homatropine (HYDROMET) 5-1.5 MG/5ML syrup   Other Relevant Orders   POCT respiratory syncytial virus (Completed)   POC COVID-19 (Completed)   POCT Influenza A/B (Completed)   Basic metabolic panel with GFR   POCT rapid strep A (Completed)    Return  if symptoms worsen or fail to improve.    Raven FORBES Pereyra, NP

## 2024-03-23 NOTE — Assessment & Plan Note (Signed)
 Acute Treat with Ciprodex  drops, 4 drops twice a day x 7 days Return to clinic if symptoms do not improve

## 2024-03-23 NOTE — Assessment & Plan Note (Signed)
 Acute Likely viral versus bacterial at this point COVID, flu, strep, RSV testing today negative Continue supportive management at home, monitor for symptom improvement or worsening.  If symptoms worsening over the next 48 to 72 hours treat with course of doxycycline . Both patient and husband were educated on the above plan and had no further questions

## 2024-04-02 ENCOUNTER — Encounter: Payer: Self-pay | Admitting: Internal Medicine

## 2024-04-02 ENCOUNTER — Ambulatory Visit: Admitting: Internal Medicine

## 2024-04-02 VITALS — BP 130/70 | HR 73 | Ht 68.0 in | Wt 175.0 lb

## 2024-04-02 DIAGNOSIS — E785 Hyperlipidemia, unspecified: Secondary | ICD-10-CM

## 2024-04-02 DIAGNOSIS — E1165 Type 2 diabetes mellitus with hyperglycemia: Secondary | ICD-10-CM | POA: Diagnosis not present

## 2024-04-02 DIAGNOSIS — E16A2 Hypoglycemia level 2: Secondary | ICD-10-CM

## 2024-04-02 DIAGNOSIS — E2749 Other adrenocortical insufficiency: Secondary | ICD-10-CM

## 2024-04-02 DIAGNOSIS — E041 Nontoxic single thyroid nodule: Secondary | ICD-10-CM | POA: Diagnosis not present

## 2024-04-02 LAB — POCT GLYCOSYLATED HEMOGLOBIN (HGB A1C): Hemoglobin A1C: 6.6 % — AB (ref 4.0–5.6)

## 2024-04-02 NOTE — Patient Instructions (Addendum)
 Please continue: - Metformin  ER 1000 mg 2x daily - Glipizide  2.5 mg before a larger meal  Continue prednisone  5 mg day.  Regarding Prednisone : - You absolutely need to take this medication every day and not skip doses. - Please double the dose if you have a fever, for the duration of the fever. - If you cannot take anything by mouth (vomiting) or you have severe diarrhea so that you eliminate the prednisone  pills in your stool, please make sure that you get steroids in the vein instead - go to the nearest emergency department/urgent care or you may go to your PCPs office   Please come back for a follow-up appointment in 4-6 months.
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
## 2024-05-18 ENCOUNTER — Other Ambulatory Visit: Payer: Self-pay | Admitting: Cardiology

## 2024-05-18 ENCOUNTER — Other Ambulatory Visit: Payer: Self-pay | Admitting: Internal Medicine

## 2024-05-18 ENCOUNTER — Other Ambulatory Visit: Payer: Self-pay | Admitting: Family Medicine

## 2024-05-18 DIAGNOSIS — E2749 Other adrenocortical insufficiency: Secondary | ICD-10-CM

## 2024-06-27 ENCOUNTER — Ambulatory Visit: Admitting: Family Medicine

## 2024-06-27 ENCOUNTER — Encounter: Payer: Self-pay | Admitting: Family Medicine

## 2024-06-27 VITALS — BP 139/79 | HR 69 | Temp 97.8°F | Ht 68.0 in | Wt 181.0 lb

## 2024-06-27 DIAGNOSIS — E7800 Pure hypercholesterolemia, unspecified: Secondary | ICD-10-CM

## 2024-06-27 DIAGNOSIS — F3341 Major depressive disorder, recurrent, in partial remission: Secondary | ICD-10-CM

## 2024-06-27 DIAGNOSIS — K219 Gastro-esophageal reflux disease without esophagitis: Secondary | ICD-10-CM | POA: Diagnosis not present

## 2024-06-27 DIAGNOSIS — F411 Generalized anxiety disorder: Secondary | ICD-10-CM | POA: Diagnosis not present

## 2024-06-27 DIAGNOSIS — E2839 Other primary ovarian failure: Secondary | ICD-10-CM

## 2024-06-27 DIAGNOSIS — I10 Essential (primary) hypertension: Secondary | ICD-10-CM | POA: Diagnosis not present

## 2024-06-27 DIAGNOSIS — R7401 Elevation of levels of liver transaminase levels: Secondary | ICD-10-CM

## 2024-06-27 MED ORDER — ALBUTEROL SULFATE HFA 108 (90 BASE) MCG/ACT IN AERS: 2.0000 | INHALATION_SPRAY | Freq: Four times a day (QID) | RESPIRATORY_TRACT | 1 refills | Status: AC | PRN

## 2024-06-27 MED ORDER — DIAZEPAM 5 MG PO TABS: ORAL_TABLET | ORAL | 1 refills | Status: AC

## 2024-06-27 MED ORDER — OMEPRAZOLE 40 MG PO CPDR: 40.0000 mg | DELAYED_RELEASE_CAPSULE | Freq: Two times a day (BID) | ORAL | 3 refills | Status: AC

## 2024-06-27 MED ORDER — SERTRALINE HCL 100 MG PO TABS: 100.0000 mg | ORAL_TABLET | Freq: Every day | ORAL | 3 refills | Status: AC

## 2024-06-27 MED ORDER — ROSUVASTATIN CALCIUM 20 MG PO TABS: 20.0000 mg | ORAL_TABLET | Freq: Every day | ORAL | 3 refills | Status: AC

## 2024-06-27 MED ORDER — LISINOPRIL 40 MG PO TABS: 40.0000 mg | ORAL_TABLET | Freq: Every day | ORAL | 3 refills | Status: AC

## 2024-06-27 MED ORDER — PROMETHAZINE HCL 12.5 MG PO TABS: ORAL_TABLET | ORAL | 2 refills | Status: AC

## 2024-06-27 NOTE — Progress Notes (Signed)
 Office Note 06/27/2024  CC:  Chief Complaint  Patient presents with   Medical Management of Chronic Issues    Pt is fasting    HPI:  Patient is a 68 y.o. female who is here for follow-up hypertension, hypercholesterolemia, and elevated LFTs.  [place] [name] is doing well. She was having lots of trouble getting her Adderall refilled and got frustrated so she has not been on it and more than 6 months.  She feels like her concentration/focus, motivation are all okay off of [place] medication. She is retired and enjoying it.  Her rheumatologist has her on methotrexate, daily prednisone , and Actemra  and she seems to be doing well.  She requests some refills today, including diazepam  for her as needed use for migraine headaches.   PMP AWARE reviewed today: most recent rx for Adderall was filled 10/07/23, # 30, rx by me. No red flags.   Past Medical History:  Diagnosis Date   Acute medial meniscus tear of right knee    Adrenal insufficiency (Addison's disease) (HCC) 02/2021   Anemia of chronic disease    Mild, Hb stable consistently   Anxiety and depression    Carpal tunnel syndrome    2023, bilateral   Chest pain 10/2017   +Cardiac CT.  Myocardial perfusion imaging NORMAL, EF>65%   Collagen vascular disease    Diabetes mellitus 1995   Managed by Dr. Trixie (Endo).  +retinopathy   GERD (gastroesophageal reflux disease) per pt watches diet and take tums as needed   with hx of prox esoph stricture + dilation procedure   H/O hiatal hernia    slightly larger (moderate size) on CT angio done to r/o PE 10/2017. noted on egd again 03/2021   Hepatic steatosis    02/2023 ultrasound   History of adrenal insufficiency    in 2022 when she tried coming off her chronic daily prednisone  for her RA   Hyperlipidemia    Hypertension    Interstitial cystitis    Lichen sclerosus of female genitalia    Migraine    Osteoarthritis, multiple sites    Palpitations 10/2017   3 wk event monitor: symptoms  correlate with sinus rhythm with PACs, at times runs of PACs up to 5 beats.   Pseudogout of knee, left    colchicine  helpful   Psoriasis    Seasonal allergies    Seropositive rheumatoid arthritis (HCC)    Hands, knees, jaw, elbow: responding to Simponi as of 09/29/15 rheum f/u.  Waning effectiveness on 03/2016 f/u so pt switched to Orencia injections.  Doing well on chronic low-dose prednisone  therapy+ methotrexate +orencia as of 01/2019, 08/2019, 11/2019    Past Surgical History:  Procedure Laterality Date   ABDOMINAL HYSTERECTOMY  1991   for fibroids; ovaries still in   BIOPSY  03/17/2021   Procedure: BIOPSY;  Surgeon: Elicia Claw, MD;  Location: [facility];  Service: Gastroenterology;;   BREAST BIOPSY  2001; 02/12/16   fibrocystic breast disease in 2001 and again in 02/2016 (+scar from prev bx site w/calcifications).   CARDIOVASCULAR STRESS TEST  11/01/2017   Myocard perf imaging: NORMAL (EF >60-65%).  12/30/21 normal   CARPAL TUNNEL RELEASE Right 2024   COLONOSCOPY  09/12/2013; 02/25/20   Normal 2015 and 2021: Recall 5 yrs ([facility], Dr. Lennard) due to FH of colon polyps.   CYSTOSCOPY  2004   for chronic UTI   DEXA  10/2016   Bone density normal (T-score 0.0)   ESOPHAGOGASTRODUODENOSCOPY  04/19/2011   Nl except  antral gastritis: bx = mild chron gastritis (H pyloria neg)    ESOPHAGOGASTRODUODENOSCOPY (EGD) WITH PROPOFOL  N/A 03/27/2020   Esoph stenosis-->dilated.  Mod size hiatal hernia (Dig Hea Spec). Procedure: ESOPHAGOGASTRODUODENOSCOPY (EGD) WITH PROPOFOL  ;  Surgeon: Lennard Lesta FALCON, MD;  Location: [facility];  Service: Endoscopy;  Laterality: N/A;   ESOPHAGOGASTRODUODENOSCOPY (EGD) WITH PROPOFOL  N/A 03/17/2021   Esoph stricture->dilated.  Large hiatal hernia. Also +esoph candidiasis, bx inflammation.  Procedure: ESOPHAGOGASTRODUODENOSCOPY (EGD) WITH PROPOFOL ;  Surgeon: Elicia Claw, MD;  Location: [facility];  Service: Gastroenterology;  Laterality: N/A;   EVENT MONITOR   10/2017   3 wk-->Symptoms correlate with sinus rhythm with PACs, at times runs of PACs up to 5 beats.   GALLBLADDER SURGERY  2019   intraarticular steroid injection  2013   3 R knee & L X 1; Dr Terrie   KNEE ARTHROCENTESIS  2013   R knee x 3 & L X 1   KNEE ARTHROSCOPY  10/21/2011   Procedure: ARTHROSCOPY KNEE;  Surgeon: Tanda DELENA Heading, MD;  Location: [facility];  Service: Orthopedics;  Laterality: Right;  WITH MEDIAL and lateral shaving of femoral chondyl  with microfracture technique of lateral and medial femoral chondyl suprapatellar synovectomy   LAPAROSCOPIC CHOLECYSTECTOMY  01/2018   myocardial perf imaging     Normal 12/2021   SAVORY DILATION N/A 03/27/2020   Procedure: SAVORY DILATION;  Surgeon: Lennard Lesta FALCON, MD;  Location: [facility];  Service: Endoscopy;  Laterality: N/A;   TOTAL KNEE ARTHROPLASTY  04/12/2012   RIGHT  Procedure: TOTAL KNEE ARTHROPLASTY;  Surgeon: Tanda DELENA Heading, MD;  Location: [facility];  Service: Orthopedics;  Laterality: Right;  Right Total Knee Arthroplasty   TOTAL KNEE ARTHROPLASTY Left 05/26/2021   Procedure: TOTAL KNEE ARTHROPLASTY;  Surgeon: Ernie Cough, MD;  Location: [facility];  Service: Orthopedics;  Laterality: Left;   TRANSTHORACIC ECHOCARDIOGRAM  11/13/2021   EF 65-70%, mild dec RV fxn, grd I DD, o/w normal   TUBAL LIGATION  1981    Family History  Problem Relation Age of Onset   Diabetes Mother    Stroke Mother 66   Osteoarthritis Mother    Heart disease Father        CABG   Lung cancer Father    Prostate cancer Father    Pancreatic cancer Father    Bipolar disorder Daughter    Arthritis Maternal Grandmother        rheumatoid   Pancreatic cancer Paternal Grandfather    Stroke Brother 67   Heart attack Brother 24       smoker   Anxiety disorder Son    Breast cancer Neg Hx     Social History   Socioeconomic History   Marital status: Married    Spouse name: Not on file   Number of children: Not on file   Years  of education: Not on file   Highest education level: Not on file  Occupational History   Not on file  Tobacco Use   Smoking status: Never   Smokeless tobacco: Never  Vaping Use   Vaping status: Never Used  Substance and Sexual Activity   Alcohol use: No   Drug use: No   Sexual activity: Not Currently  Other Topics Concern   Not on file  Social History Narrative   Married, 3 children (2 in [place], 1 in [place]).  4 grandchildren.   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)   No tob/alc/drugs.   2 cups of coffee/day  x 3 months   Regular exercise- no-due to arthritis.   Religion affecting care, it allows stress management:   Social Drivers of Health   Tobacco Use: Low Risk (06/27/2024)   Patient History    Smoking Tobacco Use: Never    Smokeless Tobacco Use: Never    Passive Exposure: Not on file  Financial Resource Strain: Low Risk (08/31/2023)   Overall Financial Resource Strain (CARDIA)    Difficulty of Paying Living Expenses: Not hard at all  Food Insecurity: No Food Insecurity (08/31/2023)   Hunger Vital Sign    Worried About Running Out of Food in [place] Last Year: Never true    Ran Out of Food in [place] Last Year: Never true  Transportation Needs: No Transportation Needs (08/31/2023)   PRAPARE - Administrator, Civil Service (Medical): No    Lack of Transportation (Non-Medical): No  Physical Activity: Inactive (08/31/2023)   Exercise Vital Sign    Days of Exercise per Week: 0 days    Minutes of Exercise per Session: 0 min  Stress: No Stress Concern Present (08/31/2023)   [employer] of Occupational Health - Occupational Stress Questionnaire    Feeling of Stress : Not at all  Social Connections: Socially Integrated (08/31/2023)   Social Connection and Isolation Panel    Frequency of Communication with Friends and Family: Once a week    Frequency of Social Gatherings with Friends and Family: Three times a week    Attends Religious Services: More  than 4 times per year    Active Member of Clubs or Organizations: Yes    Attends Banker Meetings: More than 4 times per year    Marital Status: Married  Catering Manager Violence: Not At Risk (08/31/2023)   Humiliation, Afraid, Rape, and Kick questionnaire    Fear of Current or Ex-Partner: No    Emotionally Abused: No    Physically Abused: No    Sexually Abused: No  Depression (PHQ2-9): Low Risk (06/27/2024)   Depression (PHQ2-9)    PHQ-2 Score: 4  Alcohol Screen: Low Risk (08/31/2023)   Alcohol Screen    Last Alcohol Screening Score (AUDIT): 0  Housing: Unknown (08/31/2023)   Housing Stability Vital Sign    Unable to Pay for Housing in [place] Last Year: No    Number of Times Moved in [place] Last Year: Not on file    Homeless in [place] Last Year: No  Utilities: Not At Risk (08/31/2023)   AHC Utilities    Threatened with loss of utilities: No  Health Literacy: Adequate Health Literacy (08/31/2023)   B1300 Health Literacy    Frequency of need for help with medical instructions: Never    Outpatient Medications Prior to Visit  Medication Sig Dispense Refill   acetaminophen  (TYLENOL ) 500 MG tablet Take 1,000 mg by mouth every 6 (six) hours as needed for moderate pain.     cetirizine (ZYRTEC) 10 MG tablet Take 10 mg by mouth daily.     Colchicine  0.6 MG CAPS Take 2 capsules by mouth daily.     Continuous Glucose Sensor (FREESTYLE LIBRE 3 PLUS SENSOR) MISC 1 each by Does not apply route every 14 (fourteen) days. 6 each 3   CYANOCOBALAMIN PO Take 1 tablet by mouth daily.     Ferrous Sulfate  (IRON PO) Take 1 tablet by mouth daily.     glipiZIDE  (GLUCOTROL ) 5 MG tablet Take half a tablet 0 to once a day before a larger meal 90 tablet 3  Magnesium  300 MG CAPS Take 300 mg by mouth daily.     metFORMIN  (GLUCOPHAGE -XR) 500 MG 24 hr tablet Take 2 tablets (1,000 mg total) by mouth 2 (two) times daily with a meal. 360 tablet 3   methotrexate 50 MG/2ML injection Inject 15 mg into [place] skin once  a week.     metoprolol  tartrate (LOPRESSOR ) 25 MG tablet Take 0.5 tablets (12.5 mg total) by mouth 2 (two) times daily. 90 tablet 1   phenazopyridine  (PYRIDIUM ) 95 MG tablet Take 190 mg by mouth 2 (two) times daily as needed for pain.     predniSONE  (DELTASONE ) 5 MG tablet TAKE 1 OR 2 TABLETS BY MOUTH DAILY 180 tablet 3   primidone  (MYSOLINE ) 50 MG tablet Take 50-100 mg by mouth See admin instructions. Take 50 mg by mouth in [place] morning and 100 mg by mouth in [place] evening.     Tocilizumab  (ACTEMRA ) 80 MG/4ML SOLN injection Inject into [place] vein every 28 (twenty-eight) days.     albuterol  (VENTOLIN  HFA) 108 (90 Base) MCG/ACT inhaler Inhale 2 puffs into [place] lungs every 6 (six) hours as needed for wheezing or shortness of breath. 8 g 0   diazepam  (VALIUM ) 5 MG tablet TAKE ONE TABLET BY MOUTH EVERY TWELVE HOURS AS NEEDED FOR MIGRAINE HEADACHES 60 tablet 5   lisinopril  (ZESTRIL ) 40 MG tablet Take 1 tablet (40 mg total) by mouth daily. 90 tablet 3   promethazine  (PHENERGAN ) 12.5 MG tablet TAKE 1-2 TABLETS BY MOUTH EVERY SIX hours AS NEEDED FOR nausea 30 tablet 2   folic acid  (FOLVITE ) 1 MG tablet Take 1 mg by mouth 2 (two) times daily.     clotrimazole -betamethasone  (LOTRISONE ) cream Apply 1 Application topically daily. (Patient not taking: Reported on 06/27/2024) 30 g 0   omeprazole  (PRILOSEC ) 40 MG capsule Take 1 capsule (40 mg total) by mouth 2 (two) times daily. 60 capsule 0   rosuvastatin  (CRESTOR ) 20 MG tablet Take 1 tablet (20 mg total) by mouth daily. 90 tablet 3   sertraline  (ZOLOFT ) 100 MG tablet Take 1 tablet (100 mg total) by mouth daily. 90 tablet 3   No facility-administered medications prior to visit.    Allergies[1]   PE;    06/27/2024    2:08 PM 06/27/2024    2:00 PM 04/02/2024   10:12 AM  Vitals with BMI  Height  5' 8 5' 8  Weight  181 lbs 175 lbs  BMI  27.53 26.61  Systolic 139 164 869  Diastolic 79 89 70  Pulse  69 73   Gen: Alert, well appearing.  Patient is oriented  to person, place, time, and situation. AFFECT: pleasant, lucid thought and speech. No further exam today  Pertinent labs:  Lab Results  Component Value Date   TSH 1.24 02/17/2022   Lab Results  Component Value Date   WBC 7.4 02/17/2022   HGB 11.3 (L) 02/17/2022   HCT 34.7 (L) 02/17/2022   MCV 76.5 (L) 02/17/2022   PLT 327.0 02/17/2022   Lab Results  Component Value Date   CREATININE 0.99 03/23/2024   BUN 11 03/23/2024   NA 140 03/23/2024   K 4.5 03/23/2024   CL 105 03/23/2024   CO2 24 03/23/2024   Lab Results  Component Value Date   ALT 76 (A) 02/22/2023   AST 57 (A) 02/22/2023   ALKPHOS 57 02/22/2023   BILITOT 0.4 02/17/2022   Lab Results  Component Value Date   CHOL 157 11/16/2023   Lab Results  Component Value Date   HDL 40 11/16/2023   Lab Results  Component Value Date   LDLCALC 86 11/16/2023   Lab Results  Component Value Date   TRIG 184 (H) 11/16/2023   Lab Results  Component Value Date   CHOLHDL 3.9 11/16/2023   Lab Results  Component Value Date   HGBA1C 6.6 (A) 04/02/2024   ASSESSMENT AND PLAN:   #1 hypertension, well-controlled on lisinopril  40 mg a day and Lopressor  12.5 mg twice daily. Most recent labs at her rheumatologist showed electrolytes normal, serum creatinine 0.9 (dated 05/29/2024).  2.  Hypercholesterolemia, doing well on rosuvastatin  20 mg a day. Last LDL was 86 on 11/16/2023. Recheck lipids 6 months.  #3 GERD, requiring 40 mg twice daily omeprazole  long-term.  #4 GAD, recurrent major depressive disorder in partial remission. She does well on sertraline  100 mg a day.  Lately she has been trying to use half a tab to make her medicine last and notes a significant increase in her level of anxiety and mood lability. Resume 100 mg daily.  #5 elevated transaminases.  She has hepatic steatosis.  Methotrexate may also be contributing. AST and ALT have been relatively stable, most recent monitoring with rheumatologist--> AST 54, ALT  86.  6.  Preventative health care: Vaccines: ALL UTD. Labs: all UTD (rheum MD) Cervical ca screening: n/a-->pt with hx of TAH for benign dx. Breast ca screening:  due for screening as of 02/2024--> she will arrange mammogram with [facility]. Colon ca screening: recall 2026. Osteoporosis screening: due for DEXA->ordered today.  Of note, 05/29/2024 labs at rheumatology: Sodium 135, potassium 4.5, serum creatinine 0.9, AST 54, ALT 86, hemoglobin 12.1, WBC 8.8, platelets 175.  An After Visit Summary was printed and given to [facility] patient.  FOLLOW UP:  Return in about 6 months (around 12/26/2024) for routine chronic illness f/u.  Signed:  Gerlene Hockey, MD           06/27/2024     [1]  Allergies Allergen Reactions   Steri-Strip Compound Benzoin [Benzoin] Anaphylaxis and Itching    Blisters and itching   Penicillins Rash    Tolerated Cephalosporin 05/26/21.     Pravastatin     Myalgias   Simvastatin      Myalgias   Hydrocodone -Acetaminophen  Nausea And Vomiting   Hydroxychloroquine Sulfate Rash

## 2024-06-27 NOTE — Patient Instructions (Addendum)
° ° °  Please have [facility] send us  your eye exam results to [phone].

## 2024-06-28 ENCOUNTER — Other Ambulatory Visit (HOSPITAL_COMMUNITY): Payer: Self-pay

## 2024-06-28 ENCOUNTER — Telehealth: Payer: Self-pay

## 2024-06-28 NOTE — Telephone Encounter (Signed)
 Pharmacy Patient Advocate Encounter   Received notification from Physician's Office that prior authorization for Promethazine  12.5 mg tablets is required/requested.   Insurance verification completed.   The patient is insured through [employer].   Per test claim: PA required; PA submitted to above mentioned insurance via Latent Key/confirmation #/EOC [facility] Status is pending

## 2024-06-28 NOTE — Telephone Encounter (Signed)
 Pharmacy Patient Advocate Encounter   Received notification from Onbase that prior authorization for diazePAM  5MG  tablets  is required/requested.   Insurance verification completed.   The patient is insured through [employer].   Per test claim: PA required; PA submitted to above mentioned insurance via Latent Key/confirmation #/EOC [id number] Status is pending

## 2024-06-29 ENCOUNTER — Other Ambulatory Visit (HOSPITAL_COMMUNITY): Payer: Self-pay

## 2024-07-02 ENCOUNTER — Other Ambulatory Visit (HOSPITAL_COMMUNITY): Payer: Self-pay

## 2024-07-02 NOTE — Telephone Encounter (Signed)
 Please notify patient of this.  I suspect this is a very inexpensive medicine out-of-pocket.  This is probably the best way for her to get it.

## 2024-07-02 NOTE — Telephone Encounter (Signed)
LMV for pt to return call.  

## 2024-07-02 NOTE — Telephone Encounter (Signed)
 Pharmacy Patient Advocate Encounter  Received notification from [facility] that Prior Authorization for  Promethazine  12.5 mg tablets has been APPROVED from 05/29/2024 to 06/28/2025. Ran test claim, Copay is $3.98. This test claim was processed through [facility]- copay amounts may vary at other pharmacies due to pharmacy/plan contracts, or as the patient moves through the different stages of their insurance plan.   PA #/Case ID/Reference #: [id number]

## 2024-07-02 NOTE — Telephone Encounter (Signed)
 Pharmacy Patient Advocate Encounter  Received notification from [facility] that Prior Authorization for diazePAM  5MG  tablets  has been DENIED.  See denial reason below. No denial letter attached in CMM. Will attach denial letter to Media tab once received.   PA #/Case ID/Reference #: [id number]

## 2024-07-03 NOTE — Telephone Encounter (Signed)
 Primary Information  Source  Raven [name] (Patient)   Subject  Raven [name] (Patient)   Topic  Clinical - Medication Prior Auth    Communication  Reason for CRM: Patient retuning a call from [place], advised of the PA.   Patient Information  Patient Name Gender DOB SSN  Erdine, Hulen Female Apr 10, 1956 [id number]   Contacts  Contact Date/Time Type Contact Phone/Fax  07/02/2024 03:37 PM EST Phone (Incoming) Virlee, Stroschein [name] (Self) [phone] (M)

## 2024-07-09 ENCOUNTER — Ambulatory Visit (HOSPITAL_COMMUNITY)
Admission: RE | Admit: 2024-07-09 | Discharge: 2024-07-09 | Disposition: A | Discharge status: Home / Self Care | Source: Ambulatory Visit | Attending: Family Medicine | Admitting: Family Medicine

## 2024-07-09 DIAGNOSIS — E2839 Other primary ovarian failure: Secondary | ICD-10-CM | POA: Insufficient documentation

## 2024-07-22 ENCOUNTER — Ambulatory Visit: Payer: Self-pay | Admitting: Family Medicine

## 2024-07-22 ENCOUNTER — Encounter: Payer: Self-pay | Admitting: Family Medicine

## 2024-07-22 ENCOUNTER — Other Ambulatory Visit: Payer: Self-pay | Admitting: Family Medicine

## 2024-07-22 MED ORDER — ALENDRONATE SODIUM 70 MG PO TABS: 70.0000 mg | ORAL_TABLET | ORAL | 3 refills | Status: AC

## 2024-09-12 ENCOUNTER — Ambulatory Visit: Payer: Medicare Other

## 2024-10-01 ENCOUNTER — Ambulatory Visit: Admitting: Internal Medicine

## 2024-12-26 ENCOUNTER — Ambulatory Visit: Admitting: Family Medicine
